# Patient Record
Sex: Female | Born: 1935 | Race: White | Hispanic: No | Smoking: Former smoker
Health system: Southern US, Community
[De-identification: ages and names within clinical notes are randomized; demographics above are authoritative.]

## PROBLEM LIST (undated history)

## (undated) DIAGNOSIS — M7989 Other specified soft tissue disorders: Secondary | ICD-10-CM

## (undated) DIAGNOSIS — E785 Hyperlipidemia, unspecified: Secondary | ICD-10-CM

## (undated) DIAGNOSIS — H269 Unspecified cataract: Secondary | ICD-10-CM

## (undated) DIAGNOSIS — L03116 Cellulitis of left lower limb: Secondary | ICD-10-CM

## (undated) DIAGNOSIS — N189 Chronic kidney disease, unspecified: Secondary | ICD-10-CM

## (undated) DIAGNOSIS — K219 Gastro-esophageal reflux disease without esophagitis: Secondary | ICD-10-CM

## (undated) DIAGNOSIS — Z862 Personal history of diseases of the blood and blood-forming organs and certain disorders involving the immune mechanism: Secondary | ICD-10-CM

## (undated) DIAGNOSIS — J189 Pneumonia, unspecified organism: Secondary | ICD-10-CM

## (undated) DIAGNOSIS — J302 Other seasonal allergic rhinitis: Secondary | ICD-10-CM

## (undated) DIAGNOSIS — Q394 Esophageal web: Secondary | ICD-10-CM

## (undated) DIAGNOSIS — M1990 Unspecified osteoarthritis, unspecified site: Secondary | ICD-10-CM

## (undated) DIAGNOSIS — D71 Functional disorders of polymorphonuclear neutrophils: Secondary | ICD-10-CM

## (undated) DIAGNOSIS — I10 Essential (primary) hypertension: Secondary | ICD-10-CM

## (undated) DIAGNOSIS — J449 Chronic obstructive pulmonary disease, unspecified: Secondary | ICD-10-CM

## (undated) DIAGNOSIS — Z8719 Personal history of other diseases of the digestive system: Secondary | ICD-10-CM

## (undated) DIAGNOSIS — D509 Iron deficiency anemia, unspecified: Secondary | ICD-10-CM

## (undated) DIAGNOSIS — J439 Emphysema, unspecified: Secondary | ICD-10-CM

## (undated) DIAGNOSIS — C4491 Basal cell carcinoma of skin, unspecified: Secondary | ICD-10-CM

## (undated) DIAGNOSIS — I480 Paroxysmal atrial fibrillation: Secondary | ICD-10-CM

## (undated) DIAGNOSIS — M5441 Lumbago with sciatica, right side: Secondary | ICD-10-CM

## (undated) DIAGNOSIS — I499 Cardiac arrhythmia, unspecified: Secondary | ICD-10-CM

## (undated) DIAGNOSIS — T7840XA Allergy, unspecified, initial encounter: Secondary | ICD-10-CM

## (undated) HISTORY — DX: Unspecified cataract: H26.9

## (undated) HISTORY — DX: Functional disorders of polymorphonuclear neutrophils: D71

## (undated) HISTORY — PX: CATARACT EXTRACTION: SUR2

## (undated) HISTORY — DX: Hyperlipidemia, unspecified: E78.5

## (undated) HISTORY — DX: Unspecified osteoarthritis, unspecified site: M19.90

## (undated) HISTORY — DX: Other specified soft tissue disorders: M79.89

## (undated) HISTORY — DX: Cellulitis of left lower limb: L03.116

## (undated) HISTORY — DX: Personal history of diseases of the blood and blood-forming organs and certain disorders involving the immune mechanism: Z86.2

## (undated) HISTORY — DX: Emphysema, unspecified: J43.9

## (undated) HISTORY — DX: Chronic kidney disease, unspecified: N18.9

## (undated) HISTORY — DX: Iron deficiency anemia, unspecified: D50.9

## (undated) HISTORY — DX: Basal cell carcinoma of skin, unspecified: C44.91

## (undated) HISTORY — DX: Allergy, unspecified, initial encounter: T78.40XA

## (undated) HISTORY — DX: Other seasonal allergic rhinitis: J30.2

## (undated) HISTORY — DX: Paroxysmal atrial fibrillation: I48.0

## (undated) HISTORY — DX: Gastro-esophageal reflux disease without esophagitis: K21.9

## (undated) HISTORY — PX: ABDOMINAL HYSTERECTOMY: SHX81

## (undated) HISTORY — DX: Lumbago with sciatica, right side: M54.41

## (undated) HISTORY — PX: OTHER SURGICAL HISTORY: SHX169

## (undated) HISTORY — PX: TOTAL KNEE ARTHROPLASTY: SHX125

## (undated) HISTORY — DX: Chronic obstructive pulmonary disease, unspecified: J44.9

---

## 1976-04-28 HISTORY — PX: APPENDECTOMY: SHX54

## 1994-04-28 HISTORY — PX: ANKLE SURGERY: SHX546

## 1997-09-21 ENCOUNTER — Other Ambulatory Visit: Admission: RE | Admit: 1997-09-21 | Discharge: 1997-09-21 | Payer: Self-pay | Admitting: Obstetrics and Gynecology

## 1998-04-28 HISTORY — PX: ABDOMINAL HYSTERECTOMY: SHX81

## 1998-05-17 ENCOUNTER — Encounter: Payer: Self-pay | Admitting: Obstetrics and Gynecology

## 1998-05-24 ENCOUNTER — Inpatient Hospital Stay (HOSPITAL_COMMUNITY): Admission: RE | Admit: 1998-05-24 | Discharge: 1998-05-28 | Payer: Self-pay | Admitting: Obstetrics and Gynecology

## 1999-02-27 ENCOUNTER — Encounter: Admission: RE | Admit: 1999-02-27 | Discharge: 1999-02-27 | Payer: Self-pay | Admitting: Endocrinology

## 1999-02-27 ENCOUNTER — Encounter: Payer: Self-pay | Admitting: Endocrinology

## 1999-05-09 ENCOUNTER — Ambulatory Visit (HOSPITAL_COMMUNITY): Admission: RE | Admit: 1999-05-09 | Discharge: 1999-05-09 | Payer: Self-pay | Admitting: Gastroenterology

## 1999-05-09 ENCOUNTER — Encounter: Payer: Self-pay | Admitting: Gastroenterology

## 1999-06-14 ENCOUNTER — Encounter (INDEPENDENT_AMBULATORY_CARE_PROVIDER_SITE_OTHER): Payer: Self-pay | Admitting: Specialist

## 1999-06-14 ENCOUNTER — Ambulatory Visit (HOSPITAL_COMMUNITY): Admission: RE | Admit: 1999-06-14 | Discharge: 1999-06-14 | Payer: Self-pay | Admitting: Gastroenterology

## 1999-07-09 ENCOUNTER — Encounter: Payer: Self-pay | Admitting: Obstetrics and Gynecology

## 1999-07-09 ENCOUNTER — Encounter: Admission: RE | Admit: 1999-07-09 | Discharge: 1999-07-09 | Payer: Self-pay | Admitting: Obstetrics and Gynecology

## 2001-02-10 ENCOUNTER — Encounter: Payer: Self-pay | Admitting: Obstetrics and Gynecology

## 2001-02-10 ENCOUNTER — Encounter: Admission: RE | Admit: 2001-02-10 | Discharge: 2001-02-10 | Payer: Self-pay | Admitting: Obstetrics and Gynecology

## 2001-03-12 ENCOUNTER — Ambulatory Visit (HOSPITAL_COMMUNITY): Admission: RE | Admit: 2001-03-12 | Discharge: 2001-03-12 | Payer: Self-pay | Admitting: Oral & Maxillofacial Surgery

## 2001-03-12 ENCOUNTER — Encounter: Payer: Self-pay | Admitting: Oral & Maxillofacial Surgery

## 2001-03-29 ENCOUNTER — Ambulatory Visit (HOSPITAL_COMMUNITY): Admission: RE | Admit: 2001-03-29 | Discharge: 2001-03-29 | Payer: Self-pay | Admitting: Gastroenterology

## 2001-03-29 ENCOUNTER — Encounter: Payer: Self-pay | Admitting: Gastroenterology

## 2001-04-05 ENCOUNTER — Ambulatory Visit (HOSPITAL_COMMUNITY): Admission: RE | Admit: 2001-04-05 | Discharge: 2001-04-05 | Payer: Self-pay | Admitting: Gastroenterology

## 2001-04-05 ENCOUNTER — Encounter: Payer: Self-pay | Admitting: Gastroenterology

## 2002-07-26 ENCOUNTER — Encounter: Payer: Self-pay | Admitting: Family Medicine

## 2002-07-26 ENCOUNTER — Encounter: Admission: RE | Admit: 2002-07-26 | Discharge: 2002-07-26 | Payer: Self-pay | Admitting: Family Medicine

## 2002-10-18 ENCOUNTER — Encounter: Admission: RE | Admit: 2002-10-18 | Discharge: 2002-10-18 | Payer: Self-pay | Admitting: Endocrinology

## 2002-10-18 ENCOUNTER — Encounter: Payer: Self-pay | Admitting: Endocrinology

## 2003-08-28 ENCOUNTER — Encounter: Admission: RE | Admit: 2003-08-28 | Discharge: 2003-08-28 | Payer: Self-pay | Admitting: Endocrinology

## 2005-02-19 ENCOUNTER — Encounter: Admission: RE | Admit: 2005-02-19 | Discharge: 2005-02-19 | Payer: Self-pay | Admitting: Endocrinology

## 2005-02-28 ENCOUNTER — Encounter: Admission: RE | Admit: 2005-02-28 | Discharge: 2005-02-28 | Payer: Self-pay | Admitting: Endocrinology

## 2006-03-05 ENCOUNTER — Encounter: Admission: RE | Admit: 2006-03-05 | Discharge: 2006-03-05 | Payer: Self-pay | Admitting: Endocrinology

## 2006-04-28 HISTORY — PX: TOTAL KNEE ARTHROPLASTY: SHX125

## 2006-10-21 ENCOUNTER — Inpatient Hospital Stay (HOSPITAL_COMMUNITY): Admission: RE | Admit: 2006-10-21 | Discharge: 2006-10-24 | Payer: Self-pay | Admitting: Orthopedic Surgery

## 2008-10-24 ENCOUNTER — Encounter: Admission: RE | Admit: 2008-10-24 | Discharge: 2008-10-24 | Payer: Self-pay | Admitting: Orthopedic Surgery

## 2008-11-10 ENCOUNTER — Encounter: Admission: RE | Admit: 2008-11-10 | Discharge: 2008-11-10 | Payer: Self-pay | Admitting: Orthopedic Surgery

## 2010-02-12 LAB — HM DEXA SCAN: HM Dexa Scan: NORMAL

## 2010-02-21 ENCOUNTER — Encounter: Admission: RE | Admit: 2010-02-21 | Discharge: 2010-02-21 | Payer: Self-pay | Admitting: Endocrinology

## 2010-03-26 ENCOUNTER — Ambulatory Visit: Payer: Self-pay | Admitting: Vascular Surgery

## 2010-04-28 HISTORY — PX: TOTAL KNEE ARTHROPLASTY: SHX125

## 2010-07-02 ENCOUNTER — Ambulatory Visit (INDEPENDENT_AMBULATORY_CARE_PROVIDER_SITE_OTHER): Payer: Medicare Other | Admitting: Internal Medicine

## 2010-07-02 ENCOUNTER — Encounter: Payer: Self-pay | Admitting: Internal Medicine

## 2010-07-02 DIAGNOSIS — Z79899 Other long term (current) drug therapy: Secondary | ICD-10-CM

## 2010-07-02 DIAGNOSIS — M1710 Unilateral primary osteoarthritis, unspecified knee: Secondary | ICD-10-CM

## 2010-07-02 DIAGNOSIS — I10 Essential (primary) hypertension: Secondary | ICD-10-CM

## 2010-07-02 DIAGNOSIS — M1990 Unspecified osteoarthritis, unspecified site: Secondary | ICD-10-CM

## 2010-07-02 DIAGNOSIS — I739 Peripheral vascular disease, unspecified: Secondary | ICD-10-CM | POA: Insufficient documentation

## 2010-07-02 DIAGNOSIS — E7800 Pure hypercholesterolemia, unspecified: Secondary | ICD-10-CM | POA: Insufficient documentation

## 2010-07-02 DIAGNOSIS — Q394 Esophageal web: Secondary | ICD-10-CM | POA: Insufficient documentation

## 2010-07-02 DIAGNOSIS — E785 Hyperlipidemia, unspecified: Secondary | ICD-10-CM

## 2010-07-02 DIAGNOSIS — K219 Gastro-esophageal reflux disease without esophagitis: Secondary | ICD-10-CM

## 2010-07-02 LAB — CBC WITH DIFFERENTIAL/PLATELET
Basophils Relative: 0.5 % (ref 0.0–3.0)
Eosinophils Absolute: 0 10*3/uL (ref 0.0–0.7)
Eosinophils Relative: 0 % (ref 0.0–5.0)
HCT: 37.4 % (ref 36.0–46.0)
Hemoglobin: 12.6 g/dL (ref 12.0–15.0)
Lymphocytes Relative: 21.8 % (ref 12.0–46.0)
Lymphs Abs: 1.8 10*3/uL (ref 0.7–4.0)
MCHC: 33.7 g/dL (ref 30.0–36.0)
MCV: 98.8 fl (ref 78.0–100.0)
Monocytes Absolute: 0.8 10*3/uL (ref 0.1–1.0)
Monocytes Relative: 9.6 % (ref 3.0–12.0)
Neutro Abs: 5.6 10*3/uL (ref 1.4–7.7)
Platelets: 165 10*3/uL (ref 150.0–400.0)
RBC: 3.79 Mil/uL — ABNORMAL LOW (ref 3.87–5.11)
RDW: 15 % — ABNORMAL HIGH (ref 11.5–14.6)
WBC: 8.2 10*3/uL (ref 4.5–10.5)

## 2010-07-02 LAB — BASIC METABOLIC PANEL
BUN: 30 mg/dL — ABNORMAL HIGH (ref 6–23)
CO2: 26 mEq/L (ref 19–32)
Calcium: 10.3 mg/dL (ref 8.4–10.5)
Chloride: 108 mEq/L (ref 96–112)
Creatinine, Ser: 1.3 mg/dL — ABNORMAL HIGH (ref 0.4–1.2)
Glucose, Bld: 91 mg/dL (ref 70–99)
Potassium: 5.2 mEq/L — ABNORMAL HIGH (ref 3.5–5.1)
Sodium: 142 mEq/L (ref 135–145)

## 2010-07-02 LAB — HEPATIC FUNCTION PANEL
ALT: 16 U/L (ref 0–35)
AST: 29 U/L (ref 0–37)
Albumin: 4.3 g/dL (ref 3.5–5.2)
Alkaline Phosphatase: 77 U/L (ref 39–117)
Bilirubin, Direct: 0.1 mg/dL (ref 0.0–0.3)
Total Bilirubin: 0.5 mg/dL (ref 0.3–1.2)
Total Protein: 6.8 g/dL (ref 6.0–8.3)

## 2010-07-02 MED ORDER — CELECOXIB 100 MG PO CAPS: 100.0000 mg | ORAL_CAPSULE | Freq: Two times a day (BID) | ORAL | Status: DC | PRN

## 2010-07-02 NOTE — Progress Notes (Signed)
  Subjective:    Patient ID: Judith Boone, female    DOB: [birth year], 77 y.o.   MRN: [id number]  HPI  Pt presents to clinic to establish primary care and for evaluation of arthritis pain. Has longstanding h/o arthritis pain involving knees and back. Now s/p unilateral TKR followed by orthopedics. Previously took celebrex with good response and without adverse effect. Did have previous difficulty with insurance coverage for the medication but believes that has changed. Has past h/o esophageal web s/p dilatation and denies recent dysphagia. Does have PAD with moderately decreased left ABI 10/11. Recalls seeing vascular surgery with no current recommendations for surgical intervention. Has muscle cramps intermittently but no recent sx's c/w claudication.  Defers mammography. Tolerates statin tx 2x/wk with January 2012 lipid panel showing good control.  Reviewed PMH, PSH, medications, allergies, social hx and family hx.    Review of Systems  Constitutional: Negative for fever, chills and fatigue.  HENT: Negative for hearing loss, congestion and facial swelling.   Eyes: Negative for discharge and redness.  Respiratory: Negative for cough and shortness of breath.   Cardiovascular: Negative for chest pain and palpitations.  Gastrointestinal: Negative for abdominal pain and blood in stool.  Genitourinary: Negative for hematuria, decreased urine volume and difficulty urinating.  Musculoskeletal: Positive for back pain and arthralgias. Negative for myalgias and joint swelling.  Skin: Negative for color change and rash.  Neurological: Negative for weakness.  Hematological: Negative.   Psychiatric/Behavioral: Negative.        Objective:   Physical Exam  [nursing notereviewed. Constitutional: She appears well-developed and well-nourished. No distress.  HENT:  Head: Normocephalic and atraumatic.  Right Ear: External ear normal.  Left Ear: External ear normal.  Nose: Nose normal.  Mouth/Throat:  Oropharynx is clear and moist. No oropharyngeal exudate.  Eyes: Conjunctivae are normal. Right eye exhibits no discharge. Left eye exhibits no discharge. No scleral icterus.  Neck: Neck supple.  Cardiovascular: Normal rate, regular rhythm and normal heart sounds.  Exam reveals no gallop and no friction rub.   No murmur heard. Pulmonary/Chest: Effort normal and breath sounds normal. No respiratory distress. She has no wheezes. She has no rales.  Abdominal: Soft. She exhibits no distension. There is no tenderness. There is no rebound.  Lymphadenopathy:    She has no cervical adenopathy.  Neurological: She is alert. Gait normal.  Skin: Skin is warm and dry. No rash noted. She is not diaphoretic. No erythema.  Psychiatric: She has a normal mood and affect.          Assessment & Plan:

## 2010-07-02 NOTE — Assessment & Plan Note (Signed)
Reattempt celebrex prn. Discussed potential renal and gi adverse effects.

## 2010-07-02 NOTE — Assessment & Plan Note (Signed)
Good control. Continue statin tx. Obtain lft today.

## 2010-07-02 NOTE — Assessment & Plan Note (Signed)
Mild elevation. Obtain cbc, chem7. Recommend outpt bp log to be submitted for review.

## 2010-07-03 ENCOUNTER — Telehealth: Payer: Self-pay

## 2010-07-03 NOTE — Telephone Encounter (Signed)
Message copied by Kathlene November on Wed Jul 03, 2010 10:46 AM ------      Message from: Jeb Levering, Marcello Moores      Created: Tue Jul 02, 2010  9:51 PM       Potassium slightly high. Can stop potassium supplements. Creatinine only slightly above nl. Other labs nl

## 2010-07-04 ENCOUNTER — Telehealth: Payer: Self-pay

## 2010-07-04 ENCOUNTER — Other Ambulatory Visit: Payer: Self-pay

## 2010-07-04 MED ORDER — ESTROGENS CONJUGATED 0.3 MG PO TABS: 0.3000 mg | ORAL_TABLET | ORAL | Status: DC

## 2010-07-04 NOTE — Telephone Encounter (Signed)
Message copied by Kathlene November on Thu Jul 04, 2010  9:27 AM ------      Message from: Jeb Levering, Marcello Moores      Created: Tue Jul 02, 2010  9:51 PM       Potassium slightly high. Can stop potassium supplements. Creatinine only slightly above nl. Other labs nl

## 2010-07-04 NOTE — Telephone Encounter (Signed)
Pt aware and verbalized understanding.  

## 2010-08-07 ENCOUNTER — Telehealth: Payer: Self-pay

## 2010-08-07 NOTE — Telephone Encounter (Signed)
Called to notify pt that prior auth for celebrex was denied twice. Pt was aware of the possibility of it being denied and verbalized her understanding. Pt wants to know if there is anything else she can try for her arthritis. She states that she has had mobic before and it gave her some relief, so she would be willing to try that again if there isn't anything else. She also notes that she had voltaren before but experienced several side effects, so she does not want that again

## 2010-08-08 MED ORDER — MELOXICAM 7.5 MG PO TABS: 7.5000 mg | ORAL_TABLET | Freq: Every day | ORAL | Status: DC

## 2010-08-08 NOTE — Telephone Encounter (Signed)
Rx for mobic escribed to pharm. Pt aware

## 2010-08-08 NOTE — Telephone Encounter (Signed)
mobic 7.5mg  po qd prn #30 rf4

## 2010-09-10 NOTE — Op Note (Signed)
NAME:  Judith Boone, Judith Boone                  ACCOUNT NO.:  [id number]   MEDICAL RECORD NO.:  [id number]          PATIENT TYPE:  INP   LOCATION:  2550                         FACILITY:  [facility]   PHYSICIAN:  Ninetta Lights, M.D. DATE OF BIRTH:  09-20-1931   DATE OF PROCEDURE:  10/21/2006  DATE OF DISCHARGE:                               OPERATIVE REPORT   PREOPERATIVE DIAGNOSIS:  Right knee end stage degenerative arthritis,  valgus alignment.   POSTOPERATIVE DIAGNOSIS:  Right knee end stage degenerative arthritis,  valgus alignment.   OPERATIVE PROCEDURE:  Right total knee replacement, Stryker triathlon  prosthesis.  Minimally invasive system.  Cemented posterior stabilized  #4 femoral component.  Cemented #4 tibial component with 9 mm posterior  stabilized polyethylene insert.  Resurfacing 32 mm medial offset peg  cemented patellar component.   SURGEON:  Ninetta Lights, M.D.   ASSISTANT:  Alyson Locket. Ricard Dillon, P.A.-C., present throughout the entire case.   ANESTHESIA:  Spinal.   SPECIMENS:  None.   COMPLICATIONS:  None.   DRESSING:  Soft compressive with knee immobilizer.   TOURNIQUET TIME:  1 hour 20 minutes.   DRAIN:  Hemovac x1.   PROCEDURE:  The patient was brought to the operating room and placed on  operating table in a supine position.  After adequate anesthesia had  been obtained, right knee examined.  Increased valgus reasonably  correctable but still a little increased from mechanical axis.  Slight  flexion contracture further flexion 130 degrees.  Stable ligaments.  Tourniquet applied.  Prepped and draped in the usual sterile fashion.  Exsanguinated with elevation of Esmarch and tourniquet inflated to 350  mmHg.  Straight incision above the patella down to the tibial tubercle.  Medial arthrotomy, vastus splitting, preserving the quad tendon.  Knee  exposed.  Remnants of menisci, periarticular spurs, cruciate ligaments,  loose bodies all debrided.  Distal femur exposed.   Intramedullary guide  placed.  Distal cut 6 degrees of valgus to match mechanical axis  resecting 10 mm.  Epicondylar axis marked.  Sized, cut and fitted for a  posterior stabilized #4 femoral component which fit well.  Proximal  tibial resection extramedullary guide 3 degrees posterior slope cut.  Sufficient resection for a 9 mm implant.  Sized for  #4 component.  Patella was measured, the posterior 10 mm removed.  Size drilled and  fitted for a 32 mm component.  Trials put in place throughout.  #4 on  the femur, #4 on the tibia, 9 mm insert on the tibia, and a 32 on the  patella.  With this and with some capsular releasing laterally, I had  full extension, full flexion, excellent stability.  No lift off on  flexion and good patellofemoral tracking.  Tibia was marked for  appropriate rotation and then hand reamed.  All trials removed.  Copious  irrigation with a pulse irrigating device.  Cement prepared and placed  on all components.  All components firmly seated.  Polyethylene attached  to tibia.  Once the cement hardened, the knee was re-examined.  Full  extension, full flexion,  good patellofemoral tracking and stability.  Wound irrigated.  Hemovac placed through a separate stab wound.  Arthrotomy closed with #1 Vicryl.  Skin and subcutaneous tissue with  Vicryl and staples.  Margins of the wound injected with Marcaine.  Sterile compressive dressing applied.  Tourniquet was removed.  Knee  immobilizer applied.  Anesthesia reversed.  Brought to the recovery  room.  Tolerated surgery well with no complications.      Ninetta Lights, M.D.  Electronically Signed     DFM/MEDQ  D:  10/21/2006  T:  10/21/2006  Job:  [id number]

## 2010-09-10 NOTE — Consult Note (Signed)
NEW PATIENT CONSULTATION   Boone, Judith S  DOB:  Jul 24, 1931                                       03/26/2010  [id number]   Judith Boone presents today for evaluation of left calf claudication.  She  is a very pleasant 77 year old female with multiple lower extremity  complaints.  She reports bilateral night cramping.  This is in her  calves and mainly and sometimes into her feet.  She also reports that  she has severe degenerative disease in both knees.  She has had a right  knee replacement and may need a left knee replacement.  She also has  lower back and arthritis and scoliosis and does have very clear cut left  calf claudication symptoms.  She reports that when she is walking that  she feels that it may be her back in his most limiting to her.  She is  unable to walk for great distances due to this but she is able to walk  on a treadmill..  She has no history of lower extremity tissue loss.   Her past history is negative cardiac disease.  She does have  hypertension, elevated cholesterol and severe arthritis.   SOCIAL HISTORY:  She is married with 2 children.  She is a housewife.  She quit smoking in 1981 and does have 1-2 glasses of wine per day.   FAMILY HISTORY:  Negative for premature atherosclerotic disease.   REVIEW OF SYSTEMS:  No weight loss or gain.  She weighs 145 pounds.  She  is 5 feet 2 inches tall.  VASCULAR:  Positive for left calf claudication.  CARDIAC:  Negative.  GI:  Reflux.  Neurologic, pulmonary, hematologic and GU is negative.  ENT, psychiatric and skin is negative.  MUSCULOSKELETAL:  Positive for arthritis, joint pain and muscle pain.   PHYSICAL EXAMINATION:  Well-developed, well-nourished white female  appearing stated age in no acute distress.  Blood pressure 160/83, pulse 96, respirations 18.  HEENT:  Normal.  CHEST:  Clear bilaterally without rales, rhonchi or wheezes.  HEART:  Regular rate and rhythm without murmur.   Her carotid arteries  are without bruits bilaterally.  She has 2+ radial and 2+ femoral pulses  bilaterally.  I do not palpate popliteal pulses bilaterally.  She does  have a 2+ right posterior tibial pulse and absent pedal pulses on the  left.  ABDOMEN:  Soft, nontender.  No masses noted.  MUSCULOSKELETAL:  Shows no major deformity or cyanosis.  NEUROLOGIC:  No focal weakness or paresthesias.  SKIN:  Without ulcers or rashes.   She does have noninvasive lower extremity studies at [facility]  on the February 21, 2010.  This shows ankle arm index of 0.76 on the left  and 1.0 on the right.   I discussed this with Judith Boone.  I explained that she does clearly have  left calf claudication related to arterial insufficiency most likely  related to superficial artery occlusive disease.  I explained treatment  options for this.  I explained that she does not have any level of  arterial insufficiency that would cause limb threatening issues and  therefore would recommend conservative treatment.  I explained that of a  multitude of difficulties she has with walking that only calf  claudication would be corrected with intervention related to arterial  insufficiency.  Since  this is not her most limiting and certainly not  the only limiting problem, I would recommend no other evaluation or  treatment this time unless she has progressive pain or tissue loss.  She  will notify us should this occur.  Otherwise she will see [place] again on an  as-needed basis.     Rosetta Posner, M.D.  Electronically Signed   TFE/MEDQ  D:  03/26/2010  T:  03/27/2010  Job:  [id number]   cc:   Ninetta Lights, M.D.

## 2010-09-13 NOTE — Discharge Summary (Signed)
NAME:  Judith Boone, Judith Boone                  ACCOUNT NO.:  [id number]   MEDICAL RECORD NO.:  [id number]          PATIENT TYPE:  INP   LOCATION:  5010                         FACILITY:  [facility]   PHYSICIAN:  Ninetta Lights, M.D. DATE OF BIRTH:  Mar 24, 1932   DATE OF ADMISSION:  10/21/2006  DATE OF DISCHARGE:  10/24/2006                               DISCHARGE SUMMARY   FINAL DIAGNOSES:  1. Status post right total knee replacement for end-stage degenerative      joint disease.  2. Hypertension.  3. Hyperlipidemia.  4. Gastroesophageal reflux disease.   HISTORY OF PRESENT ILLNESS:  A 77 year old white female with a history  of end-stage DJD, right knee, and chronic pain, presented to our office  for a preoperative evaluation for total knee replacement.  She had  progressively worsening pain with failed response to conservative  treatment.  Significant decrease in her daily activities due to the  ongoing complaint.   HOSPITAL COURSE:  On October 21, 2006, patient was taken to the [facility]  operating room, and a right total knee replacement procedure performed.  Surgeon Kathryne Hitch, M.D. and assistant Benjiman Core, PA-C.  Anesthesia  epidural.  No specimens.  EBL minimal.  Tourniquet time one hour and 22  minutes.  One Autovac drain placed.  There were no surgical or  anesthesia complications.  Patient was transferred to recovery room in  stable condition.   On October 22, 2006, patient doing well.  A complaint of some right knee  pain.  Temp 99, pulse 76, respirations 16, blood pressure 120/53.  Hemoglobin 10.7, hematocrit 31.4, INR 1.1.  Glucose 142.  Dressing  clean, dry, and intact.  Calf nontender.  Neurovascularly intact.  Pharmacy-protocol Coumadin started.   On October 23, 2006, patient doing well with good pain control.  Vital  signs stable.  Afebrile.  Hemoglobin 10.9, hematocrit 32.2, INR 1.3,  glucose 136.  Sodium 132.  The wound looked good, staples intact.  No  drainage or signs of  infection.  Calf nontender, neurovascularly intact.  Hemovac drain discontinued.  DC PCA and Foley.  Saline-locked IV.   On October 24, 2006, patient doing well.  Temp 99, pulse 100, respirations  16, blood pressure 128/56.  States that she is ready to go home.  WBC  9.8, hemoglobin 10, hematocrit 29.7, platelets 142.  Sodium 136,  potassium 4.1, chloride 102, CO2 28, BUN 13, creatinine 0.9, glucose  112.  Wound looked good.  Staples intact.  No drainage or signs of  infection.  Calf nontender, neurovascularly intact.  She has progressed  well with therapy.   CONDITION:  Good and stable.   DISPOSITION:  Discharged home.   MEDICATIONS:  1. Percocet 7.5/325 1-2 tabs p.o. q.4-6h. p.r.n. pain.  2. Robaxin 500 mg 1 tab p.o. q.6h. p.r.n. spasms.  3. Coumadin, pharmacy protocol.  4. Resume previous home meds.   INSTRUCTIONS:  Patient will work with home health PT/OT to improve knee  range of motion and strengthening, ambulation.  Daily dressing changes  with 4x4 gauze and tape.  Coumadin x4 weeks  postop for DVT prophylaxis.  Follow up two weeks postop for recheck and possible staple removal.  Return sooner if needed.      Alyson Locket. Velora Heckler.      Ninetta Lights, M.D.  Electronically Signed    JMO/MEDQ  D:  12/18/2006  T:  12/18/2006  Job:  [id number]

## 2010-12-02 ENCOUNTER — Ambulatory Visit: Payer: BC Managed Care – PPO | Admitting: Internal Medicine

## 2010-12-12 ENCOUNTER — Ambulatory Visit (INDEPENDENT_AMBULATORY_CARE_PROVIDER_SITE_OTHER): Payer: Medicare Other | Admitting: Internal Medicine

## 2010-12-12 ENCOUNTER — Encounter: Payer: Self-pay | Admitting: Internal Medicine

## 2010-12-12 DIAGNOSIS — Z79899 Other long term (current) drug therapy: Secondary | ICD-10-CM

## 2010-12-12 DIAGNOSIS — H9190 Unspecified hearing loss, unspecified ear: Secondary | ICD-10-CM | POA: Insufficient documentation

## 2010-12-12 DIAGNOSIS — E785 Hyperlipidemia, unspecified: Secondary | ICD-10-CM

## 2010-12-12 DIAGNOSIS — K219 Gastro-esophageal reflux disease without esophagitis: Secondary | ICD-10-CM

## 2010-12-12 DIAGNOSIS — R5383 Other fatigue: Secondary | ICD-10-CM | POA: Insufficient documentation

## 2010-12-12 DIAGNOSIS — R5381 Other malaise: Secondary | ICD-10-CM

## 2010-12-12 LAB — HM PAP SMEAR

## 2010-12-12 MED ORDER — ESTROGENS CONJUGATED 0.3 MG PO TABS: 0.3000 mg | ORAL_TABLET | ORAL | Status: DC

## 2010-12-12 MED ORDER — QUINAPRIL HCL 20 MG PO TABS: 20.0000 mg | ORAL_TABLET | Freq: Every day | ORAL | Status: DC

## 2010-12-12 MED ORDER — TRIAMTERENE-HCTZ 37.5-25 MG PO TABS: 1.0000 | ORAL_TABLET | Freq: Every day | ORAL | Status: DC

## 2010-12-12 MED ORDER — MELOXICAM 7.5 MG PO TABS: 7.5000 mg | ORAL_TABLET | Freq: Every day | ORAL | Status: DC

## 2010-12-12 MED ORDER — OMEPRAZOLE 40 MG PO CPDR: 40.0000 mg | DELAYED_RELEASE_CAPSULE | Freq: Every day | ORAL | Status: DC

## 2010-12-12 MED ORDER — ROSUVASTATIN CALCIUM 5 MG PO TABS: 5.0000 mg | ORAL_TABLET | ORAL | Status: DC

## 2010-12-12 NOTE — Progress Notes (Signed)
  Subjective:    Patient ID: [name], female    DOB: 1932/04/20, 77 y.o.   MRN: [id number]  HPI Pt presents to clinic for followup of multiple medical problems. Notes daily GERD sx's without dysphagia, abd pain or blood in stool. Takes pepcid prn with improvement. Notes recent increase in fatigue. +stressors as well. Tolerating mobic for OA without GI adverse effect. Has chronic hearing loss that seems to be worsening. Thinking about possible audiology evaluation. No other complaints.  Reviewed pmh, medications and allergies    Review of Systems see hpi     Objective:   Physical Exam  Nursing note and vitals reviewed. Constitutional: She appears well-developed and well-nourished. No distress.  HENT:  Head: Normocephalic and atraumatic.  Right Ear: Tympanic membrane, external ear and ear canal normal.  Left Ear: Tympanic membrane, external ear and ear canal normal.  Eyes: Conjunctivae are normal. No scleral icterus.  Neck: Neck supple. Carotid bruit is not present. No thyromegaly present.  Cardiovascular: Normal rate, regular rhythm and normal heart sounds.  Exam reveals no gallop and no friction rub.   No murmur heard. Pulmonary/Chest: Effort normal and breath sounds normal. No respiratory distress. She has no wheezes. She has no rales.  Neurological: She is alert.  Skin: Skin is warm and dry. She is not diaphoretic.  Psychiatric: She has a normal mood and affect.          Assessment & Plan:

## 2010-12-12 NOTE — Patient Instructions (Signed)
Please have your fasting labs done tomorrow morning at the [facility] lab. Please try magnesium oxide one a day for leg cramps.

## 2010-12-12 NOTE — Assessment & Plan Note (Signed)
Begin omeprazole 40mg  po qd. followup if no resolution within 3-4 wks

## 2010-12-12 NOTE — Assessment & Plan Note (Signed)
Discussed audiology referral. Currently not convinced about possible hearing aids. Will call if wishes to proceed with referral.

## 2010-12-12 NOTE — Assessment & Plan Note (Signed)
Obtain lipid/lft. 

## 2010-12-12 NOTE — Assessment & Plan Note (Signed)
Obtain tsh and chem7

## 2010-12-13 ENCOUNTER — Ambulatory Visit: Payer: Medicare Other

## 2010-12-13 DIAGNOSIS — E785 Hyperlipidemia, unspecified: Secondary | ICD-10-CM

## 2010-12-13 DIAGNOSIS — R5383 Other fatigue: Secondary | ICD-10-CM

## 2010-12-13 DIAGNOSIS — Z79899 Other long term (current) drug therapy: Secondary | ICD-10-CM

## 2010-12-13 LAB — HEPATIC FUNCTION PANEL
ALT: 17 U/L (ref 0–35)
AST: 30 U/L (ref 0–37)
Bilirubin, Direct: 0.1 mg/dL (ref 0.0–0.3)
Total Protein: 6.8 g/dL (ref 6.0–8.3)

## 2010-12-13 LAB — LIPID PANEL
Cholesterol: 187 mg/dL (ref 0–200)
LDL Cholesterol: 84 mg/dL (ref 0–99)
Triglycerides: 38 mg/dL (ref 0.0–149.0)
VLDL: 7.6 mg/dL (ref 0.0–40.0)

## 2010-12-13 LAB — BASIC METABOLIC PANEL
CO2: 23 mEq/L (ref 19–32)
Chloride: 107 mEq/L (ref 96–112)
Sodium: 142 mEq/L (ref 135–145)

## 2011-01-02 ENCOUNTER — Telehealth: Payer: Self-pay | Admitting: *Deleted

## 2011-01-02 NOTE — Telephone Encounter (Signed)
Last office note from 12/12/2010, phone 01/02/2011 and labs from 12/12/2010 faxed to Dr. Jamison Oka office providing surgical clearance for patient.

## 2011-01-02 NOTE — Telephone Encounter (Signed)
Juliann Pulse from Dr. Vilma Prader office with Ortho called and stated patient is scheduled for Total Knee Replacement on 01/08/2011 of left knee. They are needing a letter stating patient is okay to have surgery done.  She is requesting the  medical records last office visit, and labs faxed to [phone]. Juliann Pulse was informed patient was seen on 12/12/2010, and did not mention that she was having surgery done, therefore the provides last office note does reflect the request. She was informed that patient may need to return for evaluation. Juliann Pulse stated that patient will have  CXR and EKG done pre-op and a letter stating her medical status was all that was needed. She was informed I would sent information to provider and if there is a problem. I would call back, otherwise the records would be faxed.

## 2011-01-02 NOTE — Telephone Encounter (Signed)
No known medical contraindication for proceeding with surgery.

## 2011-01-03 ENCOUNTER — Other Ambulatory Visit (HOSPITAL_COMMUNITY): Payer: Self-pay | Admitting: Orthopedic Surgery

## 2011-01-03 ENCOUNTER — Encounter (HOSPITAL_COMMUNITY)
Admission: RE | Admit: 2011-01-03 | Discharge: 2011-01-03 | Disposition: A | Discharge status: Home / Self Care | Payer: Medicare Other | Source: Ambulatory Visit | Attending: Orthopedic Surgery | Admitting: Orthopedic Surgery

## 2011-01-03 ENCOUNTER — Ambulatory Visit (HOSPITAL_COMMUNITY)
Admission: RE | Admit: 2011-01-03 | Discharge: 2011-01-03 | Disposition: A | Discharge status: Home / Self Care | Payer: Medicare Other | Source: Ambulatory Visit | Attending: Orthopedic Surgery | Admitting: Orthopedic Surgery

## 2011-01-03 DIAGNOSIS — IMO0002 Reserved for concepts with insufficient information to code with codable children: Secondary | ICD-10-CM | POA: Insufficient documentation

## 2011-01-03 DIAGNOSIS — M1710 Unilateral primary osteoarthritis, unspecified knee: Secondary | ICD-10-CM | POA: Insufficient documentation

## 2011-01-03 DIAGNOSIS — Z01818 Encounter for other preprocedural examination: Secondary | ICD-10-CM | POA: Insufficient documentation

## 2011-01-03 DIAGNOSIS — Z01810 Encounter for preprocedural cardiovascular examination: Secondary | ICD-10-CM | POA: Insufficient documentation

## 2011-01-03 DIAGNOSIS — Z01812 Encounter for preprocedural laboratory examination: Secondary | ICD-10-CM | POA: Insufficient documentation

## 2011-01-03 DIAGNOSIS — M1712 Unilateral primary osteoarthritis, left knee: Secondary | ICD-10-CM

## 2011-01-03 LAB — COMPREHENSIVE METABOLIC PANEL
ALT: 17 U/L (ref 0–35)
AST: 26 U/L (ref 0–37)
CO2: 24 mEq/L (ref 19–32)
Calcium: 9.6 mg/dL (ref 8.4–10.5)
Chloride: 107 mEq/L (ref 96–112)
GFR calc non Af Amer: 44 mL/min — ABNORMAL LOW (ref >60)
Sodium: 143 mEq/L (ref 135–145)

## 2011-01-03 LAB — TYPE AND SCREEN

## 2011-01-03 LAB — SURGICAL PCR SCREEN: Staphylococcus aureus: NEGATIVE

## 2011-01-03 LAB — CBC
HCT: 36.7 % (ref 36.0–46.0)
Hemoglobin: 12.4 g/dL (ref 12.0–15.0)
MCH: 31.9 pg (ref 26.0–34.0)
MCHC: 33.8 g/dL (ref 30.0–36.0)
MCV: 94.3 fL (ref 78.0–100.0)

## 2011-01-03 LAB — PROTIME-INR: INR: 0.95 (ref 0.00–1.49)

## 2011-01-08 ENCOUNTER — Inpatient Hospital Stay (HOSPITAL_COMMUNITY): Payer: Medicare Other

## 2011-01-08 ENCOUNTER — Inpatient Hospital Stay (HOSPITAL_COMMUNITY)
Admission: RE | Admit: 2011-01-08 | Discharge: 2011-01-10 | DRG: 470 | Disposition: A | Discharge status: Home Health | Payer: Medicare Other | Source: Ambulatory Visit | Attending: Orthopedic Surgery | Admitting: Orthopedic Surgery

## 2011-01-08 DIAGNOSIS — Z01810 Encounter for preprocedural cardiovascular examination: Secondary | ICD-10-CM

## 2011-01-08 DIAGNOSIS — K219 Gastro-esophageal reflux disease without esophagitis: Secondary | ICD-10-CM | POA: Diagnosis present

## 2011-01-08 DIAGNOSIS — M1710 Unilateral primary osteoarthritis, unspecified knee: Principal | ICD-10-CM | POA: Diagnosis present

## 2011-01-08 DIAGNOSIS — Z96659 Presence of unspecified artificial knee joint: Secondary | ICD-10-CM

## 2011-01-08 DIAGNOSIS — I10 Essential (primary) hypertension: Secondary | ICD-10-CM | POA: Diagnosis present

## 2011-01-08 DIAGNOSIS — Z87891 Personal history of nicotine dependence: Secondary | ICD-10-CM

## 2011-01-08 DIAGNOSIS — E7800 Pure hypercholesterolemia, unspecified: Secondary | ICD-10-CM | POA: Diagnosis present

## 2011-01-08 DIAGNOSIS — Z01812 Encounter for preprocedural laboratory examination: Secondary | ICD-10-CM

## 2011-01-08 LAB — URINALYSIS, ROUTINE W REFLEX MICROSCOPIC
Glucose, UA: NEGATIVE mg/dL
Leukocytes, UA: NEGATIVE
Nitrite: NEGATIVE
Protein, ur: NEGATIVE mg/dL
Urobilinogen, UA: 0.2 mg/dL (ref 0.0–1.0)

## 2011-01-09 LAB — BASIC METABOLIC PANEL
Calcium: 8.6 mg/dL (ref 8.4–10.5)
Creatinine, Ser: 0.97 mg/dL (ref 0.50–1.10)
GFR calc non Af Amer: 56 mL/min — ABNORMAL LOW (ref >60)
Sodium: 140 mEq/L (ref 135–145)

## 2011-01-09 LAB — PROTIME-INR
INR: 1.11 (ref 0.00–1.49)
Prothrombin Time: 14.5 seconds (ref 11.6–15.2)

## 2011-01-09 LAB — CBC
Platelets: 135 10*3/uL — ABNORMAL LOW (ref 150–400)
RDW: 14.3 % (ref 11.5–15.5)
WBC: 7.7 10*3/uL (ref 4.0–10.5)

## 2011-01-10 LAB — BASIC METABOLIC PANEL
BUN: 19 mg/dL (ref 6–23)
Calcium: 8.2 mg/dL — ABNORMAL LOW (ref 8.4–10.5)
GFR calc Af Amer: 53 mL/min — ABNORMAL LOW (ref >60)
GFR calc non Af Amer: 44 mL/min — ABNORMAL LOW (ref >60)
Potassium: 4.3 mEq/L (ref 3.5–5.1)
Sodium: 139 mEq/L (ref 135–145)

## 2011-01-10 LAB — PROTIME-INR: Prothrombin Time: 19.8 seconds — ABNORMAL HIGH (ref 11.6–15.2)

## 2011-01-10 LAB — CBC
Platelets: 116 10*3/uL — ABNORMAL LOW (ref 150–400)
RBC: 2.86 MIL/uL — ABNORMAL LOW (ref 3.87–5.11)
RDW: 14.1 % (ref 11.5–15.5)
WBC: 9.6 10*3/uL (ref 4.0–10.5)

## 2011-01-29 NOTE — Op Note (Signed)
  NAMESUZUKO, [name] NO.:  [id number]  MEDICAL RECORD NO.:  [id number]  LOCATION:  5034                         FACILITY:  [facility]  PHYSICIAN:  Ninetta Lights, M.D. DATE OF BIRTH:  1931-05-31  DATE OF PROCEDURE:  01/08/2011 DATE OF DISCHARGE:                              OPERATIVE REPORT   PREOPERATIVE DIAGNOSIS:  Left knee end-stage degenerative arthritis, valgus alignment.  POSTOPERATIVE DIAGNOSIS:  Left knee end-stage degenerative arthritis, valgus alignment.  PROCEDURE:  Modified minimally invasive left total knee replacement Stryker triathlon prosthesis.  Soft tissue balancing.  Cemented pegged posterior stabilized #4 femoral component.  Cemented #4 tibial component, 9-mm polyethylene insert.  Cemented 32-mm patellar component.  SURGEON:  Ninetta Lights, MD  ASSISTANT:  Benjiman Core, PA present throughout the entire case and necessary for timely completion of procedure.  ANESTHESIA:  General.  BLOOD LOSS:  Minimal.  SPECIMENS:  None.  CULTURES:  None.  COMPLICATIONS:  None.  DRESSING:  Soft compressive with knee immobilizer.  DRAIN:  Hemovac x1.  TOURNIQUET TIME:  One hour.  PROCEDURE:  The patient was brought to the operating room, placed on the operating table in supine position.  After adequate anesthesia had been obtained, knee examined.  Increased valgus correctable.  Reasonable flexion extension.  Tourniquet applied, prepped and draped in usual sterile fashion.  External elevation Esmarch tourniquet inflated to 300 mmHg.  Straight incision above the patella down to tibial tubercle. Medial arthrotomy vastus splitting preserving quad tendon.  Knee exposed.  Grade 4 changes most marked laterally.  Medial capsule release.  Distal femur exposed.  Intramedullary guide placed.  A 10-mm resection set at 5 degrees of valgus.  Using epicondylar axis size cut and fitted for posterior stabilized peg #4 component.  Proximal tibial resection  3 degree posterior slope cut.  Size #4 component.  Recess examined to be sure remnants of menisci, loose bodies, all debris removed.  Patella exposed, posterior 10 mm removed.  Drilled and sized 54 and 32-mm component.  Trials put in place throughout.  With a #4 above below 9-mm insert, a 32 patella, I had excellent mechanical axis, good balancing of flexion/extension and good patellofemoral tracking. Tibia was marked for appropriate rotation and hand reamed.  All trials removed.  Copious irrigation with a pulse irrigating device.  Cement prepared and placed on all components firmly seated.  Polyethylene attached to tibia knee reduced.  Patella with a clamp.  Once cement hardened, the knee was reexamined.  Again pleased with alignment, stability, and motion.  Hemovac placed and brought through a separate stab wound.  Arthrotomy closed with #1 Vicryl, skin and subcutaneous tissue with Vicryl and staples.  Sterile compressive dressing applied. Tourniquet deflated and removed.  Knee immobilizer applied.  Anesthesia reversed.  Brought to recovery room.  Tolerated surgery well.  No complications.     Ninetta Lights, M.D.     DFM/MEDQ  D:  01/08/2011  T:  01/08/2011  Job:  [id number]  Electronically Signed by Kathryne Hitch M.D. on 01/29/2011 02:32:26 PM

## 2011-02-12 LAB — TYPE AND SCREEN
ABO/RH(D): O NEG
Antibody Screen: NEGATIVE

## 2011-02-12 LAB — BASIC METABOLIC PANEL
BUN: 13
CO2: 28
Calcium: 8.1 — ABNORMAL LOW
Chloride: 103
Chloride: 99
Creatinine, Ser: 0.81
GFR calc Af Amer: >60
GFR calc Af Amer: >60
GFR calc Af Amer: >60
GFR calc non Af Amer: 57 — ABNORMAL LOW
GFR calc non Af Amer: >60
Glucose, Bld: 112 — ABNORMAL HIGH
Potassium: 4.1
Sodium: 136

## 2011-02-12 LAB — ABO/RH: ABO/RH(D): O NEG

## 2011-02-12 LAB — CBC
HCT: 29.7 — ABNORMAL LOW
HCT: 31.4 — ABNORMAL LOW
HCT: 32.2 — ABNORMAL LOW
Hemoglobin: 10 — ABNORMAL LOW
Hemoglobin: 10.7 — ABNORMAL LOW
Hemoglobin: 13.8
MCHC: 33.4
MCHC: 33.7
MCHC: 34
MCV: 95.8
RBC: 3.12 — ABNORMAL LOW
RBC: 3.36 — ABNORMAL LOW
RBC: 4.39
RDW: 13.3
RDW: 13.8
RDW: 14

## 2011-02-12 LAB — COMPREHENSIVE METABOLIC PANEL
ALT: 16
AST: 27
Alkaline Phosphatase: 68
Calcium: 9.5
GFR calc Af Amer: 55 — ABNORMAL LOW
Glucose, Bld: 110 — ABNORMAL HIGH
Potassium: 4
Sodium: 136
Total Protein: 7.3

## 2011-02-12 LAB — PROTIME-INR
INR: 1.7 — ABNORMAL HIGH
Prothrombin Time: 12.7
Prothrombin Time: 17 — ABNORMAL HIGH

## 2011-02-12 LAB — URINALYSIS, ROUTINE W REFLEX MICROSCOPIC
Glucose, UA: NEGATIVE
Hgb urine dipstick: NEGATIVE
Leukocytes, UA: NEGATIVE
Protein, ur: NEGATIVE
Specific Gravity, Urine: 1.017 (ref 1.005–1.035)
pH: 5.5

## 2011-04-29 HISTORY — PX: CATARACT EXTRACTION: SUR2

## 2011-05-12 ENCOUNTER — Encounter: Payer: Self-pay | Admitting: Internal Medicine

## 2011-05-12 ENCOUNTER — Ambulatory Visit (INDEPENDENT_AMBULATORY_CARE_PROVIDER_SITE_OTHER): Payer: Medicare Other | Admitting: Internal Medicine

## 2011-05-12 DIAGNOSIS — IMO0002 Reserved for concepts with insufficient information to code with codable children: Secondary | ICD-10-CM

## 2011-05-12 DIAGNOSIS — I10 Essential (primary) hypertension: Secondary | ICD-10-CM

## 2011-05-12 DIAGNOSIS — G4700 Insomnia, unspecified: Secondary | ICD-10-CM | POA: Diagnosis not present

## 2011-05-12 DIAGNOSIS — Z79899 Other long term (current) drug therapy: Secondary | ICD-10-CM

## 2011-05-12 DIAGNOSIS — E785 Hyperlipidemia, unspecified: Secondary | ICD-10-CM | POA: Diagnosis not present

## 2011-05-12 DIAGNOSIS — M5410 Radiculopathy, site unspecified: Secondary | ICD-10-CM

## 2011-05-12 MED ORDER — ESTROGENS CONJUGATED 0.3 MG PO TABS: 0.3000 mg | ORAL_TABLET | ORAL | Status: DC

## 2011-05-12 MED ORDER — OMEPRAZOLE 40 MG PO CPDR: 40.0000 mg | DELAYED_RELEASE_CAPSULE | Freq: Every day | ORAL | Status: DC

## 2011-05-12 MED ORDER — MELOXICAM 7.5 MG PO TABS: ORAL_TABLET | ORAL | Status: DC

## 2011-05-12 MED ORDER — QUINAPRIL HCL 20 MG PO TABS: 20.0000 mg | ORAL_TABLET | Freq: Every day | ORAL | Status: DC

## 2011-05-12 MED ORDER — TRIAMTERENE-HCTZ 37.5-25 MG PO TABS: 1.0000 | ORAL_TABLET | Freq: Every day | ORAL | Status: DC

## 2011-05-12 MED ORDER — ROSUVASTATIN CALCIUM 5 MG PO TABS: 5.0000 mg | ORAL_TABLET | ORAL | Status: DC

## 2011-05-12 NOTE — Patient Instructions (Signed)
Please try over the counter valerian for sleep

## 2011-05-13 LAB — HEPATIC FUNCTION PANEL
ALT: 12 U/L (ref 0–35)
AST: 24 U/L (ref 0–37)
Bilirubin, Direct: <0.1 mg/dL (ref 0.0–0.3)
Total Protein: 6.7 g/dL (ref 6.0–8.3)

## 2011-05-13 LAB — CBC WITH DIFFERENTIAL/PLATELET
Basophils Absolute: 0.1 10*3/uL (ref 0.0–0.1)
Eosinophils Relative: 5 % (ref 0–5)
HCT: 34.5 % — ABNORMAL LOW (ref 36.0–46.0)
Hemoglobin: 11 g/dL — ABNORMAL LOW (ref 12.0–15.0)
Lymphocytes Relative: 23 % (ref 12–46)
Lymphs Abs: 1.4 10*3/uL (ref 0.7–4.0)
MCV: 94.3 fL (ref 78.0–100.0)
Monocytes Absolute: 0.7 10*3/uL (ref 0.1–1.0)
Monocytes Relative: 12 % (ref 3–12)
Neutro Abs: 3.7 10*3/uL (ref 1.7–7.7)
RBC: 3.66 MIL/uL — ABNORMAL LOW (ref 3.87–5.11)
RDW: 16.4 % — ABNORMAL HIGH (ref 11.5–15.5)
WBC: 6.2 10*3/uL (ref 4.0–10.5)

## 2011-05-13 LAB — BASIC METABOLIC PANEL
BUN: 46 mg/dL — ABNORMAL HIGH (ref 6–23)
CO2: 21 mEq/L (ref 19–32)
Chloride: 110 mEq/L (ref 96–112)
Creat: 1.66 mg/dL — ABNORMAL HIGH (ref 0.50–1.10)
Potassium: 5.1 mEq/L (ref 3.5–5.3)

## 2011-05-16 DIAGNOSIS — G4700 Insomnia, unspecified: Secondary | ICD-10-CM | POA: Insufficient documentation

## 2011-05-16 DIAGNOSIS — M5410 Radiculopathy, site unspecified: Secondary | ICD-10-CM | POA: Insufficient documentation

## 2011-05-16 NOTE — Assessment & Plan Note (Signed)
Avoid prescription medication. Attempt otc valerian prn.

## 2011-05-16 NOTE — Assessment & Plan Note (Signed)
Obtain lipid/lft. 

## 2011-05-16 NOTE — Progress Notes (Signed)
  Subjective:    Patient ID: Judith Boone, female    DOB: 1931-06-01, 78 y.o.   MRN: [id number]  HPI Pt presents to clinic for followup of multiple medical problems. Notes insomnia related to having to help husband at night. Doesn't want strong medication that would keep her from helping him. Has mild numbness involving left upper leg without injury or leg weakness. Is taking mobic, motrin and aleve without gi adverse effect. No other alleviating or exacerbating factors. No other complaints.   Past Medical History  Diagnosis Date  . Arthritis   . GERD (gastroesophageal reflux disease)    Past Surgical History  Procedure Date  . Cataract extraction   . Broken ankle repair     left  . Total knee arthroplasty     right  . Abdominal hysterectomy     reports that she has quit smoking. She has never used smokeless tobacco. She reports that she drinks alcohol. She reports that she does not use illicit drugs. family history includes Alcohol abuse in her father; Heart disease in her father; and Hypertension in her father. No Known Allergies    Review of Systems see hpi     Objective:   Physical Exam  Nursing note and vitals reviewed. Constitutional: She appears well-developed and well-nourished. No distress.  HENT:  Head: Normocephalic and atraumatic.  Eyes: Conjunctivae are normal. Right eye exhibits no discharge. Left eye exhibits no discharge. No scleral icterus.  Neck: Neck supple.  Cardiovascular: Normal rate, regular rhythm and normal heart sounds.   Pulmonary/Chest: Effort normal and breath sounds normal.  Musculoskeletal:       Bilateral le strength 5/5. Gait nl  Neurological: She is alert.  Skin: Skin is warm and dry. She is not diaphoretic.  Psychiatric: She has a normal mood and affect.          Assessment & Plan:

## 2011-05-16 NOTE — Assessment & Plan Note (Signed)
Normotensive and stable. Continue current regimen. Monitor bp as outpt and followup in clinic as scheduled.  

## 2011-05-16 NOTE — Assessment & Plan Note (Signed)
Stop multiple nsaids. Advised of potential gi and/or renal adverse effect. Take mobic 1-2 po qd prn with food and no other nsaids. Pt avoiding prednisone. Followup if no improvement or worsening.

## 2011-05-19 ENCOUNTER — Other Ambulatory Visit: Payer: Self-pay | Admitting: Internal Medicine

## 2011-05-19 DIAGNOSIS — N289 Disorder of kidney and ureter, unspecified: Secondary | ICD-10-CM

## 2011-06-02 ENCOUNTER — Telehealth: Payer: Self-pay | Admitting: Internal Medicine

## 2011-06-02 NOTE — Telephone Encounter (Signed)
Refill was sent to pharmacy on 05/12/11 #90 x 1 refill. Left detailed message on pharmacy voicemail re: previous authorization and to call if any questions.

## 2011-06-10 ENCOUNTER — Telehealth: Payer: Self-pay | Admitting: Internal Medicine

## 2011-06-10 DIAGNOSIS — N289 Disorder of kidney and ureter, unspecified: Secondary | ICD-10-CM

## 2011-06-10 NOTE — Telephone Encounter (Signed)
#  30. If taking regularly will need to go ahead with repeat chem7 due to elevated cr.

## 2011-06-11 DIAGNOSIS — N289 Disorder of kidney and ureter, unspecified: Secondary | ICD-10-CM | POA: Diagnosis not present

## 2011-06-11 LAB — BASIC METABOLIC PANEL
BUN: 34 mg/dL — ABNORMAL HIGH (ref 6–23)
Calcium: 9.7 mg/dL (ref 8.4–10.5)
Glucose, Bld: 90 mg/dL (ref 70–99)
Potassium: 5.4 mEq/L — ABNORMAL HIGH (ref 3.5–5.3)
Sodium: 141 mEq/L (ref 135–145)

## 2011-06-11 NOTE — Telephone Encounter (Signed)
Call placed to [facility]. Spoke with Levada Dy, she was informed patient should have refill from 05/12/2011 on file with 4 refills remaining. Levada Dy has verified rx on file. No refills are needed at this time.  Call placed to patient at [phone], she was informed of blood work per Dr Elizebeth Koller instructions. She stated that she will have blood work done today.  Lab order printed and sent to lab.

## 2011-06-16 ENCOUNTER — Other Ambulatory Visit: Payer: Self-pay | Admitting: Internal Medicine

## 2011-06-16 DIAGNOSIS — N289 Disorder of kidney and ureter, unspecified: Secondary | ICD-10-CM

## 2011-09-16 ENCOUNTER — Encounter: Payer: Self-pay | Admitting: Internal Medicine

## 2011-09-16 ENCOUNTER — Ambulatory Visit (INDEPENDENT_AMBULATORY_CARE_PROVIDER_SITE_OTHER): Payer: Medicare Other | Admitting: Internal Medicine

## 2011-09-16 VITALS — BP 124/72 | HR 67 | Temp 97.4°F | Resp 16 | Wt 145.0 lb

## 2011-09-16 DIAGNOSIS — H6121 Impacted cerumen, right ear: Secondary | ICD-10-CM

## 2011-09-16 DIAGNOSIS — D649 Anemia, unspecified: Secondary | ICD-10-CM | POA: Diagnosis not present

## 2011-09-16 DIAGNOSIS — H6120 Impacted cerumen, unspecified ear: Secondary | ICD-10-CM

## 2011-09-16 DIAGNOSIS — M1990 Unspecified osteoarthritis, unspecified site: Secondary | ICD-10-CM

## 2011-09-16 DIAGNOSIS — N289 Disorder of kidney and ureter, unspecified: Secondary | ICD-10-CM | POA: Diagnosis not present

## 2011-09-16 DIAGNOSIS — E785 Hyperlipidemia, unspecified: Secondary | ICD-10-CM

## 2011-09-16 DIAGNOSIS — H9190 Unspecified hearing loss, unspecified ear: Secondary | ICD-10-CM

## 2011-09-16 LAB — LIPID PANEL
HDL: 92 mg/dL (ref >39)
LDL Cholesterol: 106 mg/dL — ABNORMAL HIGH (ref 0–99)
Total CHOL/HDL Ratio: 2.3 Ratio
Triglycerides: 69 mg/dL (ref <150)
VLDL: 14 mg/dL (ref 0–40)

## 2011-09-16 LAB — HEPATIC FUNCTION PANEL
Albumin: 4.4 g/dL (ref 3.5–5.2)
Total Bilirubin: 0.4 mg/dL (ref 0.3–1.2)
Total Protein: 6.8 g/dL (ref 6.0–8.3)

## 2011-09-16 LAB — CBC WITH DIFFERENTIAL/PLATELET
Basophils Absolute: 0.1 10*3/uL (ref 0.0–0.1)
HCT: 36.5 % (ref 36.0–46.0)
Lymphocytes Relative: 26 % (ref 12–46)
Lymphs Abs: 1.4 10*3/uL (ref 0.7–4.0)
MCV: 97.1 fL (ref 78.0–100.0)
Neutro Abs: 2.8 10*3/uL (ref 1.7–7.7)
Platelets: 203 10*3/uL (ref 150–400)
RBC: 3.76 MIL/uL — ABNORMAL LOW (ref 3.87–5.11)
RDW: 15.1 % (ref 11.5–15.5)
WBC: 5.3 10*3/uL (ref 4.0–10.5)

## 2011-09-16 LAB — BASIC METABOLIC PANEL
CO2: 24 mEq/L (ref 19–32)
Chloride: 108 mEq/L (ref 96–112)
Potassium: 4.9 mEq/L (ref 3.5–5.3)

## 2011-09-16 MED ORDER — OMEPRAZOLE 40 MG PO CPDR: 40.0000 mg | DELAYED_RELEASE_CAPSULE | Freq: Every day | ORAL | Status: DC

## 2011-09-25 DIAGNOSIS — L570 Actinic keratosis: Secondary | ICD-10-CM | POA: Diagnosis not present

## 2011-09-25 DIAGNOSIS — C44320 Squamous cell carcinoma of skin of unspecified parts of face: Secondary | ICD-10-CM | POA: Diagnosis not present

## 2011-09-25 DIAGNOSIS — D485 Neoplasm of uncertain behavior of skin: Secondary | ICD-10-CM | POA: Diagnosis not present

## 2011-09-25 DIAGNOSIS — C44319 Basal cell carcinoma of skin of other parts of face: Secondary | ICD-10-CM | POA: Diagnosis not present

## 2011-09-27 DIAGNOSIS — N289 Disorder of kidney and ureter, unspecified: Secondary | ICD-10-CM | POA: Insufficient documentation

## 2011-09-27 NOTE — Assessment & Plan Note (Signed)
Attempt ear irrigation. If sx's persist consider audiology referral

## 2011-09-27 NOTE — Assessment & Plan Note (Signed)
Limit nsaid use to minimum. Attempt 1/2 ultram prn

## 2011-09-27 NOTE — Assessment & Plan Note (Signed)
Advised to limit nsaid to minimum usage. Obtain cbc and chem7. If cr worsens stop nsaid

## 2011-09-27 NOTE — Progress Notes (Signed)
  Subjective:    Patient ID: Judith Boone, female    DOB: April 08, 1932, 78 y.o.   MRN: [id number]  HPI Pt presents to clinic for followup of multiple medical problems. Reviewed chronic renal insufficiency. Takes mobic less but still requires medication for OA. 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 Taking mobic ~ q3 days for back pain. Notes bilateral hearing loss progressively.   Past Medical History  Diagnosis Date  . Arthritis   . GERD (gastroesophageal reflux disease)    Past Surgical History  Procedure Date  . Cataract extraction   . Broken ankle repair     left  . Total knee arthroplasty     right  . Abdominal hysterectomy     reports that she has quit smoking. She has never used smokeless tobacco. She reports that she drinks alcohol. She reports that she does not use illicit drugs. family history includes Alcohol abuse in her father; Heart disease in her father; and Hypertension in her father. No Known Allergies    Review of Systems see hpi     Objective:   Physical Exam  Nursing note and vitals reviewed. Constitutional: She appears well-developed and well-nourished. No distress.  HENT:  Head: Normocephalic and atraumatic.  Right Ear: External ear normal.  Left Ear: External ear normal.       Bilateral cerumen  Eyes: Conjunctivae are normal. No scleral icterus.  Cardiovascular: Normal rate, regular rhythm and normal heart sounds.   Pulmonary/Chest: Effort normal and breath sounds normal. No respiratory distress. She has no wheezes. She has no rales.  Neurological: She is alert.  Skin: Skin is warm and dry. She is not diaphoretic.  Psychiatric: She has a normal mood and affect.          Assessment & Plan:

## 2011-10-14 DIAGNOSIS — C44319 Basal cell carcinoma of skin of other parts of face: Secondary | ICD-10-CM | POA: Diagnosis not present

## 2011-10-21 DIAGNOSIS — C44319 Basal cell carcinoma of skin of other parts of face: Secondary | ICD-10-CM | POA: Diagnosis not present

## 2011-10-21 DIAGNOSIS — C44320 Squamous cell carcinoma of skin of unspecified parts of face: Secondary | ICD-10-CM | POA: Diagnosis not present

## 2011-11-28 ENCOUNTER — Other Ambulatory Visit: Payer: Self-pay | Admitting: Internal Medicine

## 2011-11-28 NOTE — Telephone Encounter (Signed)
Rx Done/SLS 

## 2011-12-16 ENCOUNTER — Ambulatory Visit: Payer: Medicare Other | Admitting: Internal Medicine

## 2011-12-23 ENCOUNTER — Telehealth: Payer: Self-pay | Admitting: Internal Medicine

## 2011-12-23 ENCOUNTER — Encounter: Payer: Self-pay | Admitting: Internal Medicine

## 2011-12-23 ENCOUNTER — Ambulatory Visit (INDEPENDENT_AMBULATORY_CARE_PROVIDER_SITE_OTHER): Payer: Medicare Other | Admitting: Internal Medicine

## 2011-12-23 VITALS — BP 102/72 | HR 54 | Temp 97.5°F | Resp 18 | Wt 146.5 lb

## 2011-12-23 DIAGNOSIS — H9190 Unspecified hearing loss, unspecified ear: Secondary | ICD-10-CM

## 2011-12-23 DIAGNOSIS — M129 Arthropathy, unspecified: Secondary | ICD-10-CM | POA: Diagnosis not present

## 2011-12-23 DIAGNOSIS — M1990 Unspecified osteoarthritis, unspecified site: Secondary | ICD-10-CM

## 2011-12-23 DIAGNOSIS — E785 Hyperlipidemia, unspecified: Secondary | ICD-10-CM

## 2011-12-23 DIAGNOSIS — N289 Disorder of kidney and ureter, unspecified: Secondary | ICD-10-CM

## 2011-12-23 LAB — CBC WITH DIFFERENTIAL/PLATELET
Eosinophils Absolute: 0.3 10*3/uL (ref 0.0–0.7)
HCT: 37.2 % (ref 36.0–46.0)
Hemoglobin: 12 g/dL (ref 12.0–15.0)
Lymphs Abs: 1.6 10*3/uL (ref 0.7–4.0)
MCH: 30.9 pg (ref 26.0–34.0)
MCV: 95.9 fL (ref 78.0–100.0)
Monocytes Absolute: 0.9 10*3/uL (ref 0.1–1.0)
Monocytes Relative: 17 % — ABNORMAL HIGH (ref 3–12)
Neutrophils Relative %: 47 % (ref 43–77)
RBC: 3.88 MIL/uL (ref 3.87–5.11)

## 2011-12-23 LAB — BASIC METABOLIC PANEL
Chloride: 106 mEq/L (ref 96–112)
Creat: 1.46 mg/dL — ABNORMAL HIGH (ref 0.50–1.10)
Potassium: 5.2 mEq/L (ref 3.5–5.3)
Sodium: 141 mEq/L (ref 135–145)

## 2011-12-23 NOTE — Patient Instructions (Signed)
Please schedule fasting labs prior to next visit Cbc, chem7-renal insufficiency and lipid/lft-272.4

## 2011-12-23 NOTE — Assessment & Plan Note (Signed)
Attempt to limit nsaid use. reattempt ultram at half dose. Rheumatology consult

## 2011-12-23 NOTE — Assessment & Plan Note (Signed)
Obtain cbc, chem7. Attempt to limit nsaid use

## 2011-12-23 NOTE — Assessment & Plan Note (Signed)
Audiology referral.

## 2011-12-23 NOTE — Progress Notes (Signed)
  Subjective:    Patient ID: Judith Boone, female    DOB: 30-Dec-1931, 78 y.o.   MRN: [id number]  HPI Pt presents to clinic for followup of multiple medical problems. Notes continued diminished hearing despite ear irrigation. Has chronic arthritis pain especially located in the back. Takes mobic recently qd. Has attempted to reduce use due to cri but finds this difficult. Is reluctant to take ultram due to sedation and concerns over impairing her ability to care for her husband.   Past Medical History  Diagnosis Date  . Arthritis   . GERD (gastroesophageal reflux disease)    Past Surgical History  Procedure Date  . Cataract extraction   . Broken ankle repair     left  . Total knee arthroplasty     right  . Abdominal hysterectomy     reports that she has quit smoking. She has never used smokeless tobacco. She reports that she drinks alcohol. She reports that she does not use illicit drugs. family history includes Alcohol abuse in her father; Heart disease in her father; and Hypertension in her father. No Known Allergies   Review of Systems see hpi     Objective:   Physical Exam  Nursing note and vitals reviewed. Constitutional: She appears well-developed and well-nourished. No distress.  HENT:  Head: Normocephalic and atraumatic.  Skin: She is not diaphoretic.          Assessment & Plan:

## 2012-01-01 NOTE — Telephone Encounter (Signed)
Please schedule fasting labs prior to next visit  Cbc, chem7-renal insufficiency and lipid/lft  Future orders placed and copy given to the lab.

## 2012-01-20 ENCOUNTER — Ambulatory Visit: Payer: Medicare Other | Attending: Internal Medicine | Admitting: Audiology

## 2012-01-20 DIAGNOSIS — H9190 Unspecified hearing loss, unspecified ear: Secondary | ICD-10-CM | POA: Diagnosis not present

## 2012-01-26 ENCOUNTER — Encounter: Payer: Self-pay | Admitting: Internal Medicine

## 2012-01-28 DIAGNOSIS — M25549 Pain in joints of unspecified hand: Secondary | ICD-10-CM | POA: Diagnosis not present

## 2012-01-28 DIAGNOSIS — M545 Low back pain, unspecified: Secondary | ICD-10-CM | POA: Diagnosis not present

## 2012-01-28 DIAGNOSIS — M546 Pain in thoracic spine: Secondary | ICD-10-CM | POA: Diagnosis not present

## 2012-01-28 DIAGNOSIS — M25569 Pain in unspecified knee: Secondary | ICD-10-CM | POA: Diagnosis not present

## 2012-02-05 DIAGNOSIS — M2550 Pain in unspecified joint: Secondary | ICD-10-CM | POA: Diagnosis not present

## 2012-02-05 DIAGNOSIS — R300 Dysuria: Secondary | ICD-10-CM | POA: Diagnosis not present

## 2012-02-05 DIAGNOSIS — Z79899 Other long term (current) drug therapy: Secondary | ICD-10-CM | POA: Diagnosis not present

## 2012-02-10 DIAGNOSIS — M48061 Spinal stenosis, lumbar region without neurogenic claudication: Secondary | ICD-10-CM | POA: Diagnosis not present

## 2012-02-10 DIAGNOSIS — Z96659 Presence of unspecified artificial knee joint: Secondary | ICD-10-CM | POA: Diagnosis not present

## 2012-02-10 DIAGNOSIS — Z471 Aftercare following joint replacement surgery: Secondary | ICD-10-CM | POA: Diagnosis not present

## 2012-02-16 DIAGNOSIS — Z8262 Family history of osteoporosis: Secondary | ICD-10-CM | POA: Diagnosis not present

## 2012-02-16 DIAGNOSIS — Z13820 Encounter for screening for osteoporosis: Secondary | ICD-10-CM | POA: Diagnosis not present

## 2012-02-17 DIAGNOSIS — H43819 Vitreous degeneration, unspecified eye: Secondary | ICD-10-CM | POA: Diagnosis not present

## 2012-02-17 DIAGNOSIS — Z961 Presence of intraocular lens: Secondary | ICD-10-CM | POA: Diagnosis not present

## 2012-02-17 DIAGNOSIS — H52209 Unspecified astigmatism, unspecified eye: Secondary | ICD-10-CM | POA: Diagnosis not present

## 2012-02-17 DIAGNOSIS — H01009 Unspecified blepharitis unspecified eye, unspecified eyelid: Secondary | ICD-10-CM | POA: Diagnosis not present

## 2012-02-24 DIAGNOSIS — M545 Low back pain, unspecified: Secondary | ICD-10-CM | POA: Diagnosis not present

## 2012-02-24 DIAGNOSIS — M25569 Pain in unspecified knee: Secondary | ICD-10-CM | POA: Diagnosis not present

## 2012-02-24 DIAGNOSIS — M546 Pain in thoracic spine: Secondary | ICD-10-CM | POA: Diagnosis not present

## 2012-02-24 DIAGNOSIS — M25549 Pain in joints of unspecified hand: Secondary | ICD-10-CM | POA: Diagnosis not present

## 2012-03-10 ENCOUNTER — Telehealth: Payer: Self-pay | Admitting: *Deleted

## 2012-03-10 NOTE — Telephone Encounter (Signed)
Message [staff]    Outside bmd results reviewed. No significant change from last exam. Show mild bone loss but not osteoporosis. Recommend f/u test in 2 years   Patient informed/SLS

## 2012-03-17 DIAGNOSIS — E785 Hyperlipidemia, unspecified: Secondary | ICD-10-CM | POA: Diagnosis not present

## 2012-03-17 DIAGNOSIS — N289 Disorder of kidney and ureter, unspecified: Secondary | ICD-10-CM | POA: Diagnosis not present

## 2012-03-17 LAB — CBC WITH DIFFERENTIAL/PLATELET
Basophils Absolute: 0.1 10*3/uL (ref 0.0–0.1)
Basophils Relative: 1 % (ref 0–1)
Eosinophils Relative: 4 % (ref 0–5)
HCT: 37 % (ref 36.0–46.0)
MCHC: 32.7 g/dL (ref 30.0–36.0)
MCV: 96.4 fL (ref 78.0–100.0)
Monocytes Absolute: 0.9 10*3/uL (ref 0.1–1.0)
RDW: 14.4 % (ref 11.5–15.5)

## 2012-03-17 LAB — BASIC METABOLIC PANEL
CO2: 26 mEq/L (ref 19–32)
Calcium: 10.1 mg/dL (ref 8.4–10.5)
Glucose, Bld: 112 mg/dL — ABNORMAL HIGH (ref 70–99)
Potassium: 4.7 mEq/L (ref 3.5–5.3)
Sodium: 139 mEq/L (ref 135–145)

## 2012-03-17 LAB — LIPID PANEL
Cholesterol: 198 mg/dL (ref 0–200)
Triglycerides: 79 mg/dL (ref <150)

## 2012-03-17 LAB — HEPATIC FUNCTION PANEL
ALT: 14 U/L (ref 0–35)
Total Protein: 6.7 g/dL (ref 6.0–8.3)

## 2012-03-17 NOTE — Addendum Note (Signed)
Addended by: Kelle Darting A on: 03/17/2012 11:18 AM   Modules accepted: Orders

## 2012-03-17 NOTE — Telephone Encounter (Signed)
Pt presented to the lab, future orders released. 

## 2012-03-19 ENCOUNTER — Encounter: Payer: Self-pay | Admitting: Internal Medicine

## 2012-03-23 ENCOUNTER — Other Ambulatory Visit: Payer: Self-pay | Admitting: Internal Medicine

## 2012-03-23 ENCOUNTER — Ambulatory Visit (INDEPENDENT_AMBULATORY_CARE_PROVIDER_SITE_OTHER): Payer: Medicare Other | Admitting: Internal Medicine

## 2012-03-23 ENCOUNTER — Telehealth: Payer: Self-pay | Admitting: Internal Medicine

## 2012-03-23 ENCOUNTER — Ambulatory Visit (HOSPITAL_BASED_OUTPATIENT_CLINIC_OR_DEPARTMENT_OTHER)
Admission: RE | Admit: 2012-03-23 | Discharge: 2012-03-23 | Disposition: A | Discharge status: Home / Self Care | Payer: Medicare Other | Source: Ambulatory Visit | Attending: Internal Medicine | Admitting: Internal Medicine

## 2012-03-23 ENCOUNTER — Encounter: Payer: Self-pay | Admitting: Internal Medicine

## 2012-03-23 VITALS — BP 138/86 | HR 78 | Temp 97.4°F | Resp 16 | Wt 147.8 lb

## 2012-03-23 DIAGNOSIS — N289 Disorder of kidney and ureter, unspecified: Secondary | ICD-10-CM

## 2012-03-23 DIAGNOSIS — M79676 Pain in unspecified toe(s): Secondary | ICD-10-CM | POA: Insufficient documentation

## 2012-03-23 DIAGNOSIS — M1990 Unspecified osteoarthritis, unspecified site: Secondary | ICD-10-CM

## 2012-03-23 DIAGNOSIS — M7989 Other specified soft tissue disorders: Secondary | ICD-10-CM

## 2012-03-23 DIAGNOSIS — K219 Gastro-esophageal reflux disease without esophagitis: Secondary | ICD-10-CM | POA: Diagnosis not present

## 2012-03-23 DIAGNOSIS — M79609 Pain in unspecified limb: Secondary | ICD-10-CM | POA: Diagnosis not present

## 2012-03-23 DIAGNOSIS — M129 Arthropathy, unspecified: Secondary | ICD-10-CM | POA: Diagnosis not present

## 2012-03-23 MED ORDER — OMEPRAZOLE 40 MG PO CPDR: 40.0000 mg | DELAYED_RELEASE_CAPSULE | Freq: Every day | ORAL | Status: DC

## 2012-03-23 NOTE — Assessment & Plan Note (Signed)
Off Mobic. Seeing rheumatology. With improved creatinine agree with sparing when necessary over-the-counter Motrin

## 2012-03-23 NOTE — Assessment & Plan Note (Signed)
Given unilateral presentation obtained lower extremity ultrasound rule out DVT. If negative suspect venous insufficiency contribution. Elevate leg and use compression hose.

## 2012-03-23 NOTE — Telephone Encounter (Signed)
Lab order week of 06-14-2012 Please schedule fasting labs prior to next visit  Chem7, a1c-hyperglycemia, uric acid-arthralgia

## 2012-03-23 NOTE — Patient Instructions (Signed)
Please schedule fasting labs prior to next visit Chem7, a1c-hyperglycemia, uric acid-arthralgia

## 2012-03-23 NOTE — Assessment & Plan Note (Signed)
Symptoms not strongly suggestive of gout. Obtain uric acid with next visit. Suspect arthritic changes of joint.

## 2012-03-23 NOTE — Assessment & Plan Note (Signed)
Improved off Mobic. Use OTC NSAID sparingly. Repeat Chem-7 next visit.

## 2012-03-23 NOTE — Assessment & Plan Note (Signed)
Change omeprazole to 20 mg daily. Notify clinic if develops breakthrough symptoms

## 2012-03-23 NOTE — Progress Notes (Signed)
  Subjective:    Patient ID: Judith Boone, female    DOB: [birth year], 78 y.o.   MRN: [id number]  HPI Pt presents to clinic for followup of multiple medical problems. Notes two-week history of left lower extremity swelling without chest pain shortness of breath. No obvious risk factor for the VTE. Notes left first toe joint pain. Does not occur with attacks or with associated inflammatory changes of the joint. Wonders about gout. Reviewed improved creatinine off Mobic. Sees rheumatology now for arthritis. Takes over-the-counter Motrin sparingly. Insurance will no longer cover 40 mg of omeprazole however we'll cover 20 mg.  Past Medical History  Diagnosis Date  . Arthritis   . GERD (gastroesophageal reflux disease)    Past Surgical History  Procedure Date  . Cataract extraction   . Broken ankle repair     left  . Total knee arthroplasty     right  . Abdominal hysterectomy     reports that she has quit smoking. She has never used smokeless tobacco. She reports that she drinks alcohol. She reports that she does not use illicit drugs. family history includes Alcohol abuse in her father; Heart disease in her father; and Hypertension in her father. No Known Allergies    Review of Systems see hpi     Objective:   Physical Exam  Nursing note and vitals reviewed. Constitutional: She appears well-developed and well-nourished. No distress.  HENT:  Head: Normocephalic and atraumatic.  Eyes: Conjunctivae normal are normal. No scleral icterus.  Neck: Neck supple.  Cardiovascular: Normal rate, regular rhythm and normal heart sounds.   Pulmonary/Chest: Effort normal and breath sounds normal. No respiratory distress. She has no wheezes. She has no rales.  Neurological: She is alert.  Skin: Skin is warm and dry. She is not diaphoretic.       Left lower extremity with +1 edema. Noted venous Rakowski's. Negative Homans. Gait normal. No palpable cords.  Psychiatric: She has a normal mood and affect.           Assessment & Plan:

## 2012-03-26 ENCOUNTER — Other Ambulatory Visit: Payer: Self-pay | Admitting: *Deleted

## 2012-03-26 MED ORDER — OMEPRAZOLE 20 MG PO CPDR: 20.0000 mg | DELAYED_RELEASE_CAPSULE | Freq: Every day | ORAL | Status: DC

## 2012-03-26 NOTE — Telephone Encounter (Signed)
Received notification that Insurance will not cover Omeprazole 40 mg; Ok per VO TWH change to Omperazole 20 mg BID; Rx to pharmacy & pt informed/SLS

## 2012-05-04 DIAGNOSIS — I789 Disease of capillaries, unspecified: Secondary | ICD-10-CM | POA: Diagnosis not present

## 2012-05-04 DIAGNOSIS — L570 Actinic keratosis: Secondary | ICD-10-CM | POA: Diagnosis not present

## 2012-05-04 DIAGNOSIS — Z85828 Personal history of other malignant neoplasm of skin: Secondary | ICD-10-CM | POA: Diagnosis not present

## 2012-05-10 ENCOUNTER — Other Ambulatory Visit: Payer: Self-pay | Admitting: Internal Medicine

## 2012-05-11 NOTE — Telephone Encounter (Signed)
Rx[s] to pharmacy/SLS 

## 2012-05-19 DIAGNOSIS — M67919 Unspecified disorder of synovium and tendon, unspecified shoulder: Secondary | ICD-10-CM | POA: Diagnosis not present

## 2012-05-19 DIAGNOSIS — M19049 Primary osteoarthritis, unspecified hand: Secondary | ICD-10-CM | POA: Diagnosis not present

## 2012-05-19 DIAGNOSIS — M719 Bursopathy, unspecified: Secondary | ICD-10-CM | POA: Diagnosis not present

## 2012-05-19 DIAGNOSIS — G4700 Insomnia, unspecified: Secondary | ICD-10-CM | POA: Diagnosis not present

## 2012-05-19 DIAGNOSIS — M1710 Unilateral primary osteoarthritis, unspecified knee: Secondary | ICD-10-CM | POA: Diagnosis not present

## 2012-06-12 ENCOUNTER — Other Ambulatory Visit: Payer: Self-pay

## 2012-06-15 ENCOUNTER — Telehealth: Payer: Self-pay

## 2012-06-15 ENCOUNTER — Other Ambulatory Visit: Payer: Self-pay

## 2012-06-15 DIAGNOSIS — R739 Hyperglycemia, unspecified: Secondary | ICD-10-CM

## 2012-06-15 DIAGNOSIS — R7309 Other abnormal glucose: Secondary | ICD-10-CM | POA: Diagnosis not present

## 2012-06-15 DIAGNOSIS — M2550 Pain in unspecified joint: Secondary | ICD-10-CM | POA: Diagnosis not present

## 2012-06-15 DIAGNOSIS — N289 Disorder of kidney and ureter, unspecified: Secondary | ICD-10-CM | POA: Diagnosis not present

## 2012-06-15 LAB — HEMOGLOBIN A1C
Hgb A1c MFr Bld: 6.1 % — ABNORMAL HIGH (ref <5.7)
Mean Plasma Glucose: 128 mg/dL — ABNORMAL HIGH (ref <117)

## 2012-06-15 NOTE — Telephone Encounter (Signed)
Labs printed.

## 2012-06-16 LAB — BASIC METABOLIC PANEL
Chloride: 107 mEq/L (ref 96–112)
Potassium: 5.4 mEq/L — ABNORMAL HIGH (ref 3.5–5.3)
Sodium: 140 mEq/L (ref 135–145)

## 2012-06-16 LAB — URIC ACID: Uric Acid, Serum: 6.1 mg/dL (ref 2.4–7.0)

## 2012-06-16 NOTE — Telephone Encounter (Signed)
Quick Note:  Patient Informed and voiced understanding ______ 

## 2012-06-22 ENCOUNTER — Ambulatory Visit (INDEPENDENT_AMBULATORY_CARE_PROVIDER_SITE_OTHER): Payer: Medicare Other | Admitting: Family Medicine

## 2012-06-22 ENCOUNTER — Encounter: Payer: Self-pay | Admitting: Family Medicine

## 2012-06-22 VITALS — BP 118/75 | HR 75 | Temp 97.5°F | Ht 60.5 in | Wt 137.0 lb

## 2012-06-22 DIAGNOSIS — K219 Gastro-esophageal reflux disease without esophagitis: Secondary | ICD-10-CM

## 2012-06-22 DIAGNOSIS — I10 Essential (primary) hypertension: Secondary | ICD-10-CM

## 2012-06-22 DIAGNOSIS — N289 Disorder of kidney and ureter, unspecified: Secondary | ICD-10-CM

## 2012-06-22 LAB — RENAL FUNCTION PANEL
Calcium: 9.8 mg/dL (ref 8.4–10.5)
Chloride: 107 mEq/L (ref 96–112)
Phosphorus: 4 mg/dL (ref 2.3–4.6)
Potassium: 4.7 mEq/L (ref 3.5–5.3)
Sodium: 141 mEq/L (ref 135–145)

## 2012-06-22 NOTE — Assessment & Plan Note (Signed)
Mildly up, encouraged slight increased hydration

## 2012-06-22 NOTE — Patient Instructions (Addendum)
Labs prior to visit. Lipid, renal, cbc, tsh, hepatic  Chronic Renal Insufficiency Chronic renal insufficiency (also called kidney failure) occurs when there is kidney damage done. The damage prevents the kidneys from working like they should.  The kidneys do many important things. They:  Filter waste out of the blood.  Regulate the amount of water and various salts in the blood stream.  Produce chemicals that:  Prompt the bone marrow to make red blood cells.  Regulate blood pressure.  Keep calcium in balance throughout the bones and the body. When the kidneys are damaged, they can no longer filter waste products out of the blood. These substances build up in the blood, causing illness.  CAUSES   Diabetes.  High blood pressure.  Glomerular diseases: Conditions that damage the tiny blood vessels (glomeruli) within the kidneys, such as:  Membranous nephropathy.  IgA nephropathy.  Focal segmental glomerulosclerosis.  Poisons (such as overdoses or misuse of acetaminophen or NSAIDS, or exposure to other toxic substances).  Kidney injuries.  Kidney cancer or cancer that spreads to the kidney.  Medications such as NSAIDs. These problems are rare.  Kidney stones.  Alport disease.  Polycystic kidneys. SYMPTOMS  Most people do not notice symptoms of kidney failure until their kidney function drops below about 30-40% of normal. Symptoms can include:  Weakness.  Tiredness.  Frequent urination.  Intense need to urinate.  Excess bruising.  Low urine production.  Blood in the urine.  Pain in the kidney area.  Feeling sick to your stomach (nausea).  Vomiting.  Unusual bleeding.  Numbness in hands and feet.  Swelling in legs, arms and face.  Confusion. DIAGNOSIS  Your caregiver will look for signs of kidney failure. Tests to diagnose kidney failure may include:  Urine tests: May reveal the presence of blood, protein or sugar.  Blood tests: May show low  red blood cell count (anemia) or high levels of waste products (BUN and creatinine) that are normally filtered out of the bloodstream by the kidneys.  Imaging tests  These are tests that create pictures of the organs inside the abdomen, such as the kidneys. They may reveal masses growing in the kidneys or blockages to the flow of urine. Possible imaging tests may include:  Ultrasound.  CT scan.  MRI.  Intravenous pyelogram or IVP. This is a test that involves injecting dye into the bloodstream and then taking a series of x-rays of the kidneys. This allows the kidneys and other parts of the urinary system to be viewed more clearly.  Kidney biopsy  A small sample of kidney is removed using a special needle. The sample is examined for abnormalities under a microscope. TREATMENT  Chronic kidney failure cannot usually be cured. The various symptoms are treated, and measures are taken to avoid further kidney damage. Treatment for mild to moderate kidney failure may include:  Medication for high blood pressure.  Good control of diabetes.  Medication and diet change to improve anemia.  A low-sodium, low-potassium, low-protein and/or low-cholesterol diet.  Limiting the quantity of liquids in the diet. Treatment for more severe kidney failure may require:  Dialysis  Mechanical methods of filtering the blood.  Kidney transplant  An operation that removes the diseased kidney and replaces it with a donated kidney. HOME CARE INSTRUCTIONS   Take medication as told by your caregiver.  Quit smoking if you are a smoker. Talk to your caregiver about a smoking cessation program.  Follow your prescribed diet.  If you are prescribed  vitamins, take them as told. SEEK IMMEDIATE MEDICAL CARE IF:  You start to produce less urine.  You notice blood in your urine.  You have increased pain.  You have increased weakness, fatigue or confusion.  You notice new swelling.  You develop a  fever.  You feel that you are having side effects of medicines prescribed. Document Released: 01/22/2008 Document Revised: 07/07/2011 Document Reviewed: 05/06/2010 [facility] Patient Information 2013 [place].

## 2012-06-27 NOTE — Assessment & Plan Note (Signed)
Well controlled, no changes 

## 2012-06-27 NOTE — Progress Notes (Signed)
Patient ID: Judith Boone, female   DOB: [birth year], 79 y.o.   MRN: [id number] Judith Boone [id number] Jun 21, 1931 06/27/2012      Progress Note-Follow Up  Subjective  Chief Complaint  Chief Complaint  Patient presents with  . Follow-up    3 month    HPI  Patient is a 79 year old female who is in today for followup and is doing well. She does continue to have troubles heartburn. It was worse over the holidays anxious and wanted to improve somewhat his is on tramadol at night to help her pain if she sleeps well. No recent fevers or chills. No headache, chest pain, palpitations, shortness of breath.  Past Medical History  Diagnosis Date  . Arthritis   . GERD (gastroesophageal reflux disease)     Past Surgical History  Procedure Laterality Date  . Cataract extraction    . Broken ankle repair      left  . Total knee arthroplasty      right  . Abdominal hysterectomy      Family History  Problem Relation Age of Onset  . Alcohol abuse Father   . Heart disease Father   . Hypertension Father     History   Social History  . Marital Status: Married    Spouse Name: N/A    Number of Children: N/A  . Years of Education: N/A   Occupational History  . Not on file.   Social History Main Topics  . Smoking status: Former Research scientist (life sciences)  . Smokeless tobacco: Never Used  . Alcohol Use: Yes  . Drug Use: No  . Sexually Active: Not on file   Other Topics Concern  . Not on file   Social History Narrative  . No narrative on file    Current Outpatient Prescriptions on File Prior to Visit  Medication Sig Dispense Refill  . Ascorbic Acid (VITAMIN C) 500 MG tablet Take 500 mg by mouth daily. 2 per day       . aspirin 81 MG tablet Take 81 mg by mouth 3 (three) times a week.        . calcium carbonate (OS-CAL) 600 MG TABS Take 600 mg by mouth 2 (two) times daily with a meal.        . cetirizine (ZYRTEC) 10 MG tablet Take 10 mg by mouth daily.        . Cholecalciferol (VITAMIN D3) 1000 UNITS  CAPS Take by mouth. 2 per day       . Glucosamine-Chondroit-Vit C-Mn (GLUCOSAMINE 1500 COMPLEX PO) Take 1,500 mg by mouth. 3 per day       . Multiple Vitamins-Minerals ([facility] SPECTRAVITE PO) Take by mouth daily.        . quinapril (ACCUPRIL) 20 MG tablet TAKE 1 TABLET AT BEDTIME  90 tablet  1  . rosuvastatin (CRESTOR) 5 MG tablet Take 1 tablet (5 mg total) by mouth 2 (two) times a week.  60 tablet  1  . triamterene-hydrochlorothiazide (MAXZIDE-25) 37.5-25 MG per tablet TAKE 1 EACH (1 TABLET TOTAL) BY MOUTH DAILY.  90 tablet  1   No current facility-administered medications on file prior to visit.    No Known Allergies  Review of Systems  Review of Systems  Constitutional: Negative for fever and malaise/fatigue.  HENT: Negative for congestion.   Eyes: Negative for discharge.  Respiratory: Negative for shortness of breath.   Cardiovascular: Negative for chest pain, palpitations and leg swelling.  Gastrointestinal: Positive for heartburn. Negative for nausea,  abdominal pain and diarrhea.  Genitourinary: Negative for dysuria.  Musculoskeletal: Negative for falls.  Skin: Negative for rash.  Neurological: Negative for loss of consciousness and headaches.  Endo/Heme/Allergies: Negative for polydipsia.  Psychiatric/Behavioral: Negative for depression and suicidal ideas. The patient is not nervous/anxious and does not have insomnia.     Objective  BP 118/75  Pulse 75  Temp(Src) 97.5 F (36.4 C) (Oral)  Ht 5' 0.5" (1.537 m)  Wt 137 lb (62.143 kg)  BMI 26.31 kg/m2  SpO2 94%  Physical Exam  Physical Exam  Constitutional: She is oriented to person, place, and time and well-developed, well-nourished, and in no distress. No distress.  HENT:  Head: Normocephalic and atraumatic.  Eyes: Conjunctivae are normal.  Neck: Neck supple. No thyromegaly present.  Cardiovascular: Normal rate, regular rhythm and normal heart sounds.   No murmur heard. Pulmonary/Chest: Effort normal and breath  sounds normal. She has no wheezes.  Abdominal: She exhibits no distension and no mass.  Musculoskeletal: She exhibits no edema.  Lymphadenopathy:    She has no cervical adenopathy.  Neurological: She is alert and oriented to person, place, and time.  Skin: Skin is warm and dry. No rash noted. She is not diaphoretic.  Psychiatric: Memory, affect and judgment normal.    Lab Results  Component Value Date   TSH 1.19 12/13/2010   Lab Results  Component Value Date   WBC 6.6 03/17/2012   HGB 12.1 03/17/2012   HCT 37.0 03/17/2012   MCV 96.4 03/17/2012   PLT 197 03/17/2012   Lab Results  Component Value Date   CREATININE 1.37* 06/22/2012   BUN 40* 06/22/2012   NA 141 06/22/2012   K 4.7 06/22/2012   CL 107 06/22/2012   CO2 22 06/22/2012   Lab Results  Component Value Date   ALT 14 03/17/2012   AST 25 03/17/2012   ALKPHOS 75 03/17/2012   BILITOT 0.5 03/17/2012   Lab Results  Component Value Date   CHOL 198 03/17/2012   Lab Results  Component Value Date   HDL 84 03/17/2012   Lab Results  Component Value Date   LDLCALC 98 03/17/2012   Lab Results  Component Value Date   TRIG 79 03/17/2012   Lab Results  Component Value Date   CHOLHDL 2.4 03/17/2012     Assessment & Plan  Renal insufficiency Mildly up, encouraged slight increased hydration  GERD (gastroesophageal reflux disease) Recent flare, referred to GI and encouraged to continue Omeprazole and add an H2 blocker., avoid offending foods.  HTN (hypertension) Well controlled, no changes.

## 2012-06-27 NOTE — Assessment & Plan Note (Signed)
Recent flare, referred to GI and encouraged to continue Omeprazole and add an H2 blocker., avoid offending foods.

## 2012-07-15 DIAGNOSIS — K219 Gastro-esophageal reflux disease without esophagitis: Secondary | ICD-10-CM | POA: Diagnosis not present

## 2012-07-23 ENCOUNTER — Other Ambulatory Visit: Payer: Self-pay | Admitting: Internal Medicine

## 2012-07-23 NOTE — Telephone Encounter (Signed)
Pt

## 2012-07-23 NOTE — Telephone Encounter (Signed)
Rx request to pharmacy/SLS  

## 2012-07-23 NOTE — Telephone Encounter (Signed)
Insurance will pay for #8/30 day per pharmacy; 90-day #24x0 rf sent/SLS

## 2012-07-26 ENCOUNTER — Ambulatory Visit (INDEPENDENT_AMBULATORY_CARE_PROVIDER_SITE_OTHER): Payer: Medicare Other | Admitting: Internal Medicine

## 2012-07-26 ENCOUNTER — Encounter: Payer: Self-pay | Admitting: Internal Medicine

## 2012-07-26 VITALS — BP 152/82 | HR 86 | Temp 96.7°F

## 2012-07-26 DIAGNOSIS — M5430 Sciatica, unspecified side: Secondary | ICD-10-CM | POA: Diagnosis not present

## 2012-07-26 DIAGNOSIS — M5431 Sciatica, right side: Secondary | ICD-10-CM

## 2012-07-26 MED ORDER — PREDNISONE 10 MG PO TABS: ORAL_TABLET | ORAL | Status: DC

## 2012-07-26 MED ORDER — METHYLPREDNISOLONE ACETATE 80 MG/ML IJ SUSP
80.0000 mg | Freq: Once | INTRAMUSCULAR | Status: AC
  Administered 2012-07-26: 80 mg via INTRAMUSCULAR

## 2012-07-26 NOTE — Progress Notes (Signed)
Subjective:    Patient ID: Judith Boone, female    DOB: 03/14/1932, 79 y.o.   MRN: [id number]  HPI  Pt presents to the clinic today with c/o right leg pain x 10 days. The pain starts in the middle of her back, then runs through her right buttock down into her right leg. The pains are sharp and shooting. She describes it as a 9/10. She has never had pain like this before. She denies having a back injury but she does have arthritis and scoliosis of her back. She has been taking some Advil for this pain. She denies loss of bowel or bladder.  Review of Systems      Past Medical History  Diagnosis Date  . Arthritis   . GERD (gastroesophageal reflux disease)     Current Outpatient Prescriptions  Medication Sig Dispense Refill  . Ascorbic Acid (VITAMIN C) 500 MG tablet Take 500 mg by mouth daily. 2 per day       . aspirin 81 MG tablet Take 81 mg by mouth 3 (three) times a week.        . calcium carbonate (OS-CAL) 600 MG TABS Take 600 mg by mouth 2 (two) times daily with a meal.        . cetirizine (ZYRTEC) 10 MG tablet Take 10 mg by mouth daily.        . Cholecalciferol (VITAMIN D3) 1000 UNITS CAPS Take by mouth. 2 per day       . CRESTOR 5 MG tablet TAKE 1 TALET TWICE A WEEK  24 tablet  0  . estrogens, conjugated, (PREMARIN) 0.3 MG tablet Take 0.3 mg by mouth 2 (two) times a week.      . Glucosamine-Chondroit-Vit C-Mn (GLUCOSAMINE 1500 COMPLEX PO) Take 1,500 mg by mouth. 3 per day       . Multiple Vitamins-Minerals ([facility] SPECTRAVITE PO) Take by mouth daily.        Marland Kitchen omeprazole (PRILOSEC) 20 MG capsule       . quinapril (ACCUPRIL) 20 MG tablet TAKE 1 TABLET AT BEDTIME  90 tablet  1  . traMADol (ULTRAM) 50 MG tablet Take 50 mg by mouth at bedtime.      . triamterene-hydrochlorothiazide (MAXZIDE-25) 37.5-25 MG per tablet TAKE 1 EACH (1 TABLET TOTAL) BY MOUTH DAILY.  90 tablet  1  . predniSONE (DELTASONE) 10 MG tablet Take 3 tablets on days 1-2, take 2 tablets on days 3-4, take 1 tablet on days  5-6  12 tablet  0   No current facility-administered medications for this visit.    No Known Allergies  Family History  Problem Relation Age of Onset  . Alcohol abuse Father   . Heart disease Father   . Hypertension Father     History   Social History  . Marital Status: Married    Spouse Name: N/A    Number of Children: N/A  . Years of Education: N/A   Occupational History  . Not on file.   Social History Main Topics  . Smoking status: Former Research scientist (life sciences)  . Smokeless tobacco: Never Used  . Alcohol Use: Yes  . Drug Use: No  . Sexually Active: Not on file   Other Topics Concern  . Not on file   Social History Narrative  . No narrative on file     Constitutional: Denies fever, malaise, fatigue, headache or abrupt weight changes.  Musculoskeletal: Pt reports low back pain and difficulty with gait. Denies decrease in range  of motion, muscle pain or joint pain and swelling.  Neurological: Pt reports sharp, shooting pains into her right leg. Denies dizziness, difficulty with memory, difficulty with speech or problems with balance and coordination.   No other specific complaints in a complete review of systems (except as listed in HPI above).  Objective:   Physical Exam  BP 152/82  Pulse 86  Temp(Src) 96.7 F (35.9 C) (Oral)  SpO2 97% Wt Readings from Last 3 Encounters:  06/22/12 137 lb (62.143 kg)  03/23/12 147 lb 12 oz (67.019 kg)  12/23/11 146 lb 8 oz (66.452 kg)    General: Appears her stated age, well developed, well nourished in NAD. Cardiovascular: Normal rate and rhythm. S1,S2 noted.  No murmur, rubs or gallops noted. No JVD or BLE edema. No carotid bruits noted. Pulmonary/Chest: Normal effort and positive vesicular breath sounds. No respiratory distress. No wheezes, rales or ronchi noted.  Musculoskeletal: Decreased range of motion of the back. No signs of joint swelling. Mild difficulty with gait.  Neurological: Alert and oriented. Cranial nerves II-XII  intact. Coordination normal. +DTRs bilaterally. Positive straight leg raise.        Assessment & Plan:   Sciatica, new onset:  Pt declines xray today 80 mg Depo IM given today eRx for 6 day pred taper, start tomorrow  RTC as needed or if symptoms persist, get worse, or you lose bowel or bladder control

## 2012-07-26 NOTE — Patient Instructions (Signed)
Sciatica with Rehab The sciatic nerve runs from the back down the leg and is responsible for sensation and control of the muscles in the back (posterior) side of the thigh, lower leg, and foot. Sciatica is a condition that is characterized by inflammation of this nerve.  SYMPTOMS   Signs of nerve damage, including numbness and/or weakness along the posterior side of the lower extremity.  Pain in the back of the thigh that may also travel down the leg.  Pain that worsens when sitting for long periods of time.  Occasionally, pain in the back or buttock. CAUSES  Inflammation of the sciatic nerve is the cause of sciatica. The inflammation is due to something irritating the nerve. Common sources of irritation include:  Sitting for long periods of time.  Direct trauma to the nerve.  Arthritis of the spine.  Herniated or ruptured disk.  Slipping of the vertebrae (spondylolithesis)  Pressure from soft tissues, such as muscles or ligament-like tissue (fascia). RISK INCREASES WITH:  Sports that place pressure or stress on the spine (football or weightlifting).  Poor strength and flexibility.  Failure to warm-up properly before activity.  Family history of low back pain or disk disorders.  Previous back injury or surgery.  Poor body mechanics, especially when lifting, or poor posture. PREVENTION   Warm up and stretch properly before activity.  Maintain physical fitness:  Strength, flexibility, and endurance.  Cardiovascular fitness.  Learn and use proper technique, especially with posture and lifting. When possible, have coach correct improper technique.  Avoid activities that place stress on the spine. PROGNOSIS If treated properly, then sciatica usually resolves within 6 weeks. However, occasionally surgery is necessary.  RELATED COMPLICATIONS   Permanent nerve damage, including pain, numbness, tingle, or weakness.  Chronic back pain.  Risks of surgery: infection,  bleeding, nerve damage, or damage to surrounding tissues. TREATMENT Treatment initially involves resting from any activities that aggravate your symptoms. The use of ice and medication may help reduce pain and inflammation. The use of strengthening and stretching exercises may help reduce pain with activity. These exercises may be performed at home or with referral to a therapist. A therapist may recommend further treatments, such as transcutaneous electronic nerve stimulation (TENS) or ultrasound. Your caregiver may recommend corticosteroid injections to help reduce inflammation of the sciatic nerve. If symptoms persist despite non-surgical (conservative) treatment, then surgery may be recommended. MEDICATION  If pain medication is necessary, then nonsteroidal anti-inflammatory medications, such as aspirin and ibuprofen, or other minor pain relievers, such as acetaminophen, are often recommended.  Do not take pain medication for 7 days before surgery.  Prescription pain relievers may be given if deemed necessary by your caregiver. Use only as directed and only as much as you need.  Ointments applied to the skin may be helpful.  Corticosteroid injections may be given by your caregiver. These injections should be reserved for the most serious cases, because they may only be given a certain number of times. HEAT AND COLD  Cold treatment (icing) relieves pain and reduces inflammation. Cold treatment should be applied for 10 to 15 minutes every 2 to 3 hours for inflammation and pain and immediately after any activity that aggravates your symptoms. Use ice packs or massage the area with a piece of ice (ice massage).  Heat treatment may be used prior to performing the stretching and strengthening activities prescribed by your caregiver, physical therapist, or athletic trainer. Use a heat pack or soak the injury in warm water. SEEK  MEDICAL CARE IF:  Treatment seems to offer no benefit, or the condition  worsens.  Any medications produce adverse side effects. EXERCISES  RANGE OF MOTION (ROM) AND STRETCHING EXERCISES - Sciatica Most people with sciatic will find that their symptoms worsen with either excessive bending forward (flexion) or arching at the low back (extension). The exercises which will help resolve your symptoms will focus on the opposite motion. Your physician, physical therapist or athletic trainer will help you determine which exercises will be most helpful to resolve your low back pain. Do not complete any exercises without first consulting with your clinician. Discontinue any exercises which worsen your symptoms until you speak to your clinician. If you have pain, numbness or tingling which travels down into your buttocks, leg or foot, the goal of the therapy is for these symptoms to move closer to your back and eventually resolve. Occasionally, these leg symptoms will get better, but your low back pain may worsen; this is typically an indication of progress in your rehabilitation. Be certain to be very alert to any changes in your symptoms and the activities in which you participated in the 24 hours prior to the change. Sharing this information with your clinician will allow him/her to most efficiently treat your condition. These exercises may help you when beginning to rehabilitate your injury. Your symptoms may resolve with or without further involvement from your physician, physical therapist or athletic trainer. While completing these exercises, remember:   Restoring tissue flexibility helps normal motion to return to the joints. This allows healthier, less painful movement and activity.  An effective stretch should be held for at least 30 seconds.  A stretch should never be painful. You should only feel a gentle lengthening or release in the stretched tissue. FLEXION RANGE OF MOTION AND STRETCHING EXERCISES: STRETCH  Flexion, Single Knee to Chest   Lie on a firm bed or floor  with both legs extended in front of you.  Keeping one leg in contact with the floor, bring your opposite knee to your chest. Hold your leg in place by either grabbing behind your thigh or at your knee.  Pull until you feel a gentle stretch in your low back. Hold __________ seconds.  Slowly release your grasp and repeat the exercise with the opposite side. Repeat __________ times. Complete this exercise __________ times per day.  STRETCH  Flexion, Double Knee to Chest  Lie on a firm bed or floor with both legs extended in front of you.  Keeping one leg in contact with the floor, bring your opposite knee to your chest.  Tense your stomach muscles to support your back and then lift your other knee to your chest. Hold your legs in place by either grabbing behind your thighs or at your knees.  Pull both knees toward your chest until you feel a gentle stretch in your low back. Hold __________ seconds.  Tense your stomach muscles and slowly return one leg at a time to the floor. Repeat __________ times. Complete this exercise __________ times per day.  STRETCH  Low Trunk Rotation   Lie on a firm bed or floor. Keeping your legs in front of you, bend your knees so they are both pointed toward the ceiling and your feet are flat on the floor.  Extend your arms out to the side. This will stabilize your upper body by keeping your shoulders in contact with the floor.  Gently and slowly drop both knees together to one side until you  feel a gentle stretch in your low back. Hold for __________ seconds.  Tense your stomach muscles to support your low back as you bring your knees back to the starting position. Repeat the exercise to the other side. Repeat __________ times. Complete this exercise __________ times per day  EXTENSION RANGE OF MOTION AND FLEXIBILITY EXERCISES: STRETCH  Extension, Prone on Elbows  Lie on your stomach on the floor, a bed will be too soft. Place your palms about shoulder  width apart and at the height of your head.  Place your elbows under your shoulders. If this is too painful, stack pillows under your chest.  Allow your body to relax so that your hips drop lower and make contact more completely with the floor.  Hold this position for __________ seconds.  Slowly return to lying flat on the floor. Repeat __________ times. Complete this exercise __________ times per day.  RANGE OF MOTION  Extension, Prone Press Ups  Lie on your stomach on the floor, a bed will be too soft. Place your palms about shoulder width apart and at the height of your head.  Keeping your back as relaxed as possible, slowly straighten your elbows while keeping your hips on the floor. You may adjust the placement of your hands to maximize your comfort. As you gain motion, your hands will come more underneath your shoulders.  Hold this position __________ seconds.  Slowly return to lying flat on the floor. Repeat __________ times. Complete this exercise __________ times per day.  STRENGTHENING EXERCISES - Sciatica  These exercises may help you when beginning to rehabilitate your injury. These exercises should be done near your "sweet spot." This is the neutral, low-back arch, somewhere between fully rounded and fully arched, that is your least painful position. When performed in this safe range of motion, these exercises can be used for people who have either a flexion or extension based injury. These exercises may resolve your symptoms with or without further involvement from your physician, physical therapist or athletic trainer. While completing these exercises, remember:   Muscles can gain both the endurance and the strength needed for everyday activities through controlled exercises.  Complete these exercises as instructed by your physician, physical therapist or athletic trainer. Progress with the resistance and repetition exercises only as your caregiver advises.  You may  experience muscle soreness or fatigue, but the pain or discomfort you are trying to eliminate should never worsen during these exercises. If this pain does worsen, stop and make certain you are following the directions exactly. If the pain is still present after adjustments, discontinue the exercise until you can discuss the trouble with your clinician. STRENGTHENING Deep Abdominals, Pelvic Tilt   Lie on a firm bed or floor. Keeping your legs in front of you, bend your knees so they are both pointed toward the ceiling and your feet are flat on the floor.  Tense your lower abdominal muscles to press your low back into the floor. This motion will rotate your pelvis so that your tail bone is scooping upwards rather than pointing at your feet or into the floor.  With a gentle tension and even breathing, hold this position for __________ seconds. Repeat __________ times. Complete this exercise __________ times per day.  STRENGTHENING  Abdominals, Crunches   Lie on a firm bed or floor. Keeping your legs in front of you, bend your knees so they are both pointed toward the ceiling and your feet are flat on the floor. [employer]  your arms over your chest.  Slightly tip your chin down without bending your neck.  Tense your abdominals and slowly lift your trunk high enough to just clear your shoulder blades. Lifting higher can put excessive stress on the low back and does not further strengthen your abdominal muscles.  Control your return to the starting position. Repeat __________ times. Complete this exercise __________ times per day.  STRENGTHENING  Quadruped, Opposite UE/LE Lift  Assume a hands and knees position on a firm surface. Keep your hands under your shoulders and your knees under your hips. You may place padding under your knees for comfort.  Find your neutral spine and gently tense your abdominal muscles so that you can maintain this position. Your shoulders and hips should form a rectangle that  is parallel with the floor and is not twisted.  Keeping your trunk steady, lift your right hand no higher than your shoulder and then your left leg no higher than your hip. Make sure you are not holding your breath. Hold this position __________ seconds.  Continuing to keep your abdominal muscles tense and your back steady, slowly return to your starting position. Repeat with the opposite arm and leg. Repeat __________ times. Complete this exercise __________ times per day.  STRENGTHENING  Abdominals and Quadriceps, Straight Leg Raise   Lie on a firm bed or floor with both legs extended in front of you.  Keeping one leg in contact with the floor, bend the other knee so that your foot can rest flat on the floor.  Find your neutral spine, and tense your abdominal muscles to maintain your spinal position throughout the exercise.  Slowly lift your straight leg off the floor about 6 inches for a count of 15, making sure to not hold your breath.  Still keeping your neutral spine, slowly lower your leg all the way to the floor. Repeat this exercise with each leg __________ times. Complete this exercise __________ times per day. POSTURE AND BODY MECHANICS CONSIDERATIONS - Sciatica Keeping correct posture when sitting, standing or completing your activities will reduce the stress put on different body tissues, allowing injured tissues a chance to heal and limiting painful experiences. The following are general guidelines for improved posture. Your physician or physical therapist will provide you with any instructions specific to your needs. While reading these guidelines, remember:  The exercises prescribed by your provider will help you have the flexibility and strength to maintain correct postures.  The correct posture provides the optimal environment for your joints to work. All of your joints have less wear and tear when properly supported by a spine with good posture. This means you will  experience a healthier, less painful body.  Correct posture must be practiced with all of your activities, especially prolonged sitting and standing. Correct posture is as important when doing repetitive low-stress activities (typing) as it is when doing a single heavy-load activity (lifting). RESTING POSITIONS Consider which positions are most painful for you when choosing a resting position. If you have pain with flexion-based activities (sitting, bending, stooping, squatting), choose a position that allows you to rest in a less flexed posture. You would want to avoid curling into a fetal position on your side. If your pain worsens with extension-based activities (prolonged standing, working overhead), avoid resting in an extended position such as sleeping on your stomach. Most people will find more comfort when they rest with their spine in a more neutral position, neither too rounded nor too arched. Lying  on a non-sagging bed on your side with a pillow between your knees, or on your back with a pillow under your knees will often provide some relief. Keep in mind, being in any one position for a prolonged period of time, no matter how correct your posture, can still lead to stiffness. PROPER SITTING POSTURE In order to minimize stress and discomfort on your spine, you must sit with correct posture Sitting with good posture should be effortless for a healthy body. Returning to good posture is a gradual process. Many people can work toward this most comfortably by using various supports until they have the flexibility and strength to maintain this posture on their own. When sitting with proper posture, your ears will fall over your shoulders and your shoulders will fall over your hips. You should use the back of the chair to support your upper back. Your low back will be in a neutral position, just slightly arched. You may place a small pillow or folded towel at the base of your low back for support.  When  working at a desk, create an environment that supports good, upright posture. Without extra support, muscles fatigue and lead to excessive strain on joints and other tissues. Keep these recommendations in mind: CHAIR:   A chair should be able to slide under your desk when your back makes contact with the back of the chair. This allows you to work closely.  The chair's height should allow your eyes to be level with the upper part of your monitor and your hands to be slightly lower than your elbows. BODY POSITION  Your feet should make contact with the floor. If this is not possible, use a foot rest.  Keep your ears over your shoulders. This will reduce stress on your neck and low back. INCORRECT SITTING POSTURES   If you are feeling tired and unable to assume a healthy sitting posture, do not slouch or slump. This puts excessive strain on your back tissues, causing more damage and pain. Healthier options include:  Using more support, like a lumbar pillow.  Switching tasks to something that requires you to be upright or walking.  Talking a brief walk.  Lying down to rest in a neutral-spine position. PROLONGED STANDING WHILE SLIGHTLY LEANING FORWARD  When completing a task that requires you to lean forward while standing in one place for a long time, place either foot up on a stationary 2-4 inch high object to help maintain the best posture. When both feet are on the ground, the low back tends to lose its slight inward curve. If this curve flattens (or becomes too large), then the back and your other joints will experience too much stress, fatigue more quickly and can cause pain.  CORRECT STANDING POSTURES Proper standing posture should be assumed with all daily activities, even if they only take a few moments, like when brushing your teeth. As in sitting, your ears should fall over your shoulders and your shoulders should fall over your hips. You should keep a slight tension in your abdominal  muscles to brace your spine. Your tailbone should point down to the ground, not behind your body, resulting in an over-extended swayback posture.  INCORRECT STANDING POSTURES  Common incorrect standing postures include a forward head, locked knees and/or an excessive swayback. WALKING Walk with an upright posture. Your ears, shoulders and hips should all line-up. PROLONGED ACTIVITY IN A FLEXED POSITION When completing a task that requires you to bend forward at your  waist or lean over a low surface, try to find a way to stabilize 3 of 4 of your limbs. You can place a hand or elbow on your thigh or rest a knee on the surface you are reaching across. This will provide you more stability so that your muscles do not fatigue as quickly. By keeping your knees relaxed, or slightly bent, you will also reduce stress across your low back. CORRECT LIFTING TECHNIQUES DO :   Assume a wide stance. This will provide you more stability and the opportunity to get as close as possible to the object which you are lifting.  Tense your abdominals to brace your spine; then bend at the knees and hips. Keeping your back locked in a neutral-spine position, lift using your leg muscles. Lift with your legs, keeping your back straight.  Test the weight of unknown objects before attempting to lift them.  Try to keep your elbows locked down at your sides in order get the best strength from your shoulders when carrying an object.  Always ask for help when lifting heavy or awkward objects. INCORRECT LIFTING TECHNIQUES DO NOT:   Lock your knees when lifting, even if it is a small object.  Bend and twist. Pivot at your feet or move your feet when needing to change directions.  Assume that you cannot safely pick up a paperclip without proper posture. Document Released: 04/14/2005 Document Revised: 07/07/2011 Document Reviewed: 07/27/2008 [facility] Patient Information 2013 [place].

## 2012-08-03 DIAGNOSIS — IMO0002 Reserved for concepts with insufficient information to code with codable children: Secondary | ICD-10-CM | POA: Diagnosis not present

## 2012-08-03 DIAGNOSIS — M4120 Other idiopathic scoliosis, site unspecified: Secondary | ICD-10-CM | POA: Diagnosis not present

## 2012-08-10 DIAGNOSIS — M47817 Spondylosis without myelopathy or radiculopathy, lumbosacral region: Secondary | ICD-10-CM | POA: Diagnosis not present

## 2012-08-16 DIAGNOSIS — M47817 Spondylosis without myelopathy or radiculopathy, lumbosacral region: Secondary | ICD-10-CM | POA: Diagnosis not present

## 2012-08-16 DIAGNOSIS — IMO0002 Reserved for concepts with insufficient information to code with codable children: Secondary | ICD-10-CM | POA: Diagnosis not present

## 2012-08-18 DIAGNOSIS — M47817 Spondylosis without myelopathy or radiculopathy, lumbosacral region: Secondary | ICD-10-CM | POA: Diagnosis not present

## 2012-08-18 DIAGNOSIS — IMO0002 Reserved for concepts with insufficient information to code with codable children: Secondary | ICD-10-CM | POA: Diagnosis not present

## 2012-09-02 DIAGNOSIS — M47817 Spondylosis without myelopathy or radiculopathy, lumbosacral region: Secondary | ICD-10-CM | POA: Diagnosis not present

## 2012-09-02 DIAGNOSIS — IMO0002 Reserved for concepts with insufficient information to code with codable children: Secondary | ICD-10-CM | POA: Diagnosis not present

## 2012-09-17 DIAGNOSIS — M47817 Spondylosis without myelopathy or radiculopathy, lumbosacral region: Secondary | ICD-10-CM | POA: Diagnosis not present

## 2012-09-17 DIAGNOSIS — IMO0002 Reserved for concepts with insufficient information to code with codable children: Secondary | ICD-10-CM | POA: Diagnosis not present

## 2012-09-21 ENCOUNTER — Telehealth: Payer: Self-pay | Admitting: *Deleted

## 2012-09-21 DIAGNOSIS — N289 Disorder of kidney and ureter, unspecified: Secondary | ICD-10-CM | POA: Diagnosis not present

## 2012-09-21 DIAGNOSIS — I10 Essential (primary) hypertension: Secondary | ICD-10-CM

## 2012-09-21 DIAGNOSIS — E785 Hyperlipidemia, unspecified: Secondary | ICD-10-CM | POA: Diagnosis not present

## 2012-09-21 LAB — CBC WITH DIFFERENTIAL/PLATELET
Basophils Absolute: 0 10*3/uL (ref 0.0–0.1)
Basophils Relative: 1 % (ref 0–1)
Eosinophils Absolute: 0.2 10*3/uL (ref 0.0–0.7)
Eosinophils Relative: 3 % (ref 0–5)
Lymphocytes Relative: 31 % (ref 12–46)
MCH: 31.4 pg (ref 26.0–34.0)
MCHC: 33.5 g/dL (ref 30.0–36.0)
MCV: 93.8 fL (ref 78.0–100.0)
Platelets: 225 10*3/uL (ref 150–400)
RDW: 15.3 % (ref 11.5–15.5)
WBC: 5.9 10*3/uL (ref 4.0–10.5)

## 2012-09-21 LAB — RENAL FUNCTION PANEL
Albumin: 4 g/dL (ref 3.5–5.2)
BUN: 31 mg/dL — ABNORMAL HIGH (ref 6–23)
Calcium: 9.8 mg/dL (ref 8.4–10.5)
Creat: 1.17 mg/dL — ABNORMAL HIGH (ref 0.50–1.10)
Phosphorus: 4.2 mg/dL (ref 2.3–4.6)

## 2012-09-21 LAB — HEPATIC FUNCTION PANEL
Albumin: 4 g/dL (ref 3.5–5.2)
Alkaline Phosphatase: 73 U/L (ref 39–117)
Total Bilirubin: 0.5 mg/dL (ref 0.3–1.2)

## 2012-09-21 LAB — LIPID PANEL
HDL: 88 mg/dL (ref >39)
Total CHOL/HDL Ratio: 2.4 Ratio

## 2012-09-21 NOTE — Telephone Encounter (Signed)
Pt presented to the lab. Orders entered per 05/2012 office note as below:  Labs prior to visit. Lipid, renal, cbc, tsh, hepatic

## 2012-09-28 ENCOUNTER — Encounter: Payer: Self-pay | Admitting: Family Medicine

## 2012-09-28 ENCOUNTER — Ambulatory Visit (INDEPENDENT_AMBULATORY_CARE_PROVIDER_SITE_OTHER): Payer: Medicare Other | Admitting: Family Medicine

## 2012-09-28 ENCOUNTER — Telehealth: Payer: Self-pay

## 2012-09-28 VITALS — BP 140/72 | HR 78 | Temp 97.7°F | Ht 60.5 in | Wt 137.0 lb

## 2012-09-28 DIAGNOSIS — N289 Disorder of kidney and ureter, unspecified: Secondary | ICD-10-CM

## 2012-09-28 DIAGNOSIS — K219 Gastro-esophageal reflux disease without esophagitis: Secondary | ICD-10-CM

## 2012-09-28 DIAGNOSIS — E785 Hyperlipidemia, unspecified: Secondary | ICD-10-CM | POA: Diagnosis not present

## 2012-09-28 DIAGNOSIS — I10 Essential (primary) hypertension: Secondary | ICD-10-CM

## 2012-09-28 NOTE — Patient Instructions (Addendum)
Labs prior liver, renal, cbc, tsh, flp  Add co Q 10 enzyme daily Consider a krill oil cap such as MegaRed caps daily Cholesterol Cholesterol is a white, waxy, fat-like protein needed by your body in small amounts. The liver makes all the cholesterol you need. It is carried from the liver by the blood through the blood vessels. Deposits (plaque) may build up on blood vessel walls. This makes the arteries narrower and stiffer. Plaque increases the risk for heart attack and stroke. You cannot feel your cholesterol level even if it is very high. The only way to know is by a blood test to check your lipid (fats) levels. Once you know your cholesterol levels, you should keep a record of the test results. Work with your caregiver to to keep your levels in the desired range. WHAT THE RESULTS MEAN:  Total cholesterol is a rough measure of all the cholesterol in your blood.  LDL is the so-called bad cholesterol. This is the type that deposits cholesterol in the walls of the arteries. You want this level to be low.  HDL is the good cholesterol because it cleans the arteries and carries the LDL away. You want this level to be high.  Triglycerides are fat that the body can either burn for energy or store. High levels are closely linked to heart disease. DESIRED LEVELS:  Total cholesterol below 200.  LDL below 100 for people at risk, below 70 for very high risk.  HDL above 50 is good, above 60 is best.  Triglycerides below 150. HOW TO LOWER YOUR CHOLESTEROL:  Diet.  Choose fish or white meat chicken and [place], roasted or baked. Limit fatty cuts of red meat, fried foods, and processed meats, such as sausage and lunch meat.  Eat lots of fresh fruits and vegetables. Choose whole grains, beans, pasta, potatoes and cereals.  Use only small amounts of olive, corn or canola oils. Avoid butter, mayonnaise, shortening or palm kernel oils. Avoid foods with trans-fats.  Use skim/nonfat milk and  low-fat/nonfat yogurt and cheeses. Avoid whole milk, cream, ice cream, egg yolks and cheeses. Healthy desserts include angel food cake, ginger snaps, animal crackers, hard candy, popsicles, and low-fat/nonfat frozen yogurt. Avoid pastries, cakes, pies and cookies.  Exercise.  A regular program helps decrease LDL and raises HDL.  Helps with weight control.  Do things that increase your activity level like gardening, walking, or taking the stairs.  Medication.  May be prescribed by your caregiver to help lowering cholesterol and the risk for heart disease.  You may need medicine even if your levels are normal if you have several risk factors. HOME CARE INSTRUCTIONS   Follow your diet and exercise programs as suggested by your caregiver.  Take medications as directed.  Have blood work done when your caregiver feels it is necessary. MAKE SURE YOU:   Understand these instructions.  Will watch your condition.  Will get help right away if you are not doing well or get worse. Document Released: 01/07/2001 Document Revised: 07/07/2011 Document Reviewed: 06/30/2007 [facility] Patient Information 2014 [place], [place].

## 2012-09-28 NOTE — Progress Notes (Signed)
Patient ID: Judith Boone, female   DOB: [birth year], 79 y.o.   MRN: [id number] Judith Boone [id number] 22-May-1931 09/28/2012      Progress Note-Follow Up  Subjective  Chief Complaint  Chief Complaint  Patient presents with  . Follow-up    3 month    HPI  Patient is a 79 year old Caucasian female in today for routine followup. Overall she's been feeling well. She did.drop her Crestor to just twice a week due to myalgias. When she dropped to that level her myalgias disappeared. Otherwise she denies recent illness, chest pain, palpitations, shortness of breath, GI or GU concerns. Is taking all other medications as prescribed.  Past Medical History  Diagnosis Date  . Arthritis   . GERD (gastroesophageal reflux disease)     Past Surgical History  Procedure Laterality Date  . Cataract extraction    . Broken ankle repair      left  . Total knee arthroplasty      right  . Abdominal hysterectomy      Family History  Problem Relation Age of Onset  . Alcohol abuse Father   . Heart disease Father   . Hypertension Father     History   Social History  . Marital Status: Married    Spouse Name: N/A    Number of Children: N/A  . Years of Education: N/A   Occupational History  . Not on file.   Social History Main Topics  . Smoking status: Former Research scientist (life sciences)  . Smokeless tobacco: Never Used  . Alcohol Use: Yes  . Drug Use: No  . Sexually Active: Not on file   Other Topics Concern  . Not on file   Social History Narrative  . No narrative on file    Current Outpatient Prescriptions on File Prior to Visit  Medication Sig Dispense Refill  . Ascorbic Acid (VITAMIN C) 500 MG tablet Take 500 mg by mouth daily. 2 per day       . aspirin 81 MG tablet Take 81 mg by mouth 3 (three) times a week.        . calcium carbonate (OS-CAL) 600 MG TABS Take 600 mg by mouth 2 (two) times daily with a meal.        . cetirizine (ZYRTEC) 10 MG tablet Take 10 mg by mouth daily.        .  Cholecalciferol (VITAMIN D3) 1000 UNITS CAPS Take by mouth. 2 per day       . CRESTOR 5 MG tablet TAKE 1 TALET TWICE A WEEK  24 tablet  0  . estrogens, conjugated, (PREMARIN) 0.3 MG tablet Take 0.3 mg by mouth 2 (two) times a week.      . Multiple Vitamins-Minerals ([employer] SPECTRAVITE PO) Take by mouth daily.        Marland Kitchen omeprazole (PRILOSEC) 20 MG capsule       . quinapril (ACCUPRIL) 20 MG tablet TAKE 1 TABLET AT BEDTIME  90 tablet  1  . traMADol (ULTRAM) 50 MG tablet Take 50 mg by mouth at bedtime.      . triamterene-hydrochlorothiazide (MAXZIDE-25) 37.5-25 MG per tablet TAKE 1 EACH (1 TABLET TOTAL) BY MOUTH DAILY.  90 tablet  1   No current facility-administered medications on file prior to visit.    No Known Allergies  Review of Systems  Review of Systems  Constitutional: Negative for fever and malaise/fatigue.  HENT: Negative for congestion.   Eyes: Negative for discharge.  Respiratory: Negative for shortness  of breath.   Cardiovascular: Negative for chest pain, palpitations and leg swelling.  Gastrointestinal: Negative for nausea, abdominal pain and diarrhea.  Genitourinary: Negative for dysuria.  Musculoskeletal: Negative for falls.  Skin: Negative for rash.  Neurological: Negative for loss of consciousness and headaches.  Endo/Heme/Allergies: Negative for polydipsia.  Psychiatric/Behavioral: Negative for depression and suicidal ideas. The patient is not nervous/anxious and does not have insomnia.     Objective  BP 140/72  Pulse 78  Temp(Src) 97.7 F (36.5 C) (Oral)  Ht 5' 0.5" (1.537 m)  Wt 137 lb (62.143 kg)  BMI 26.31 kg/m2  SpO2 97%  Physical Exam  Physical Exam  Constitutional: She is oriented to person, place, and time and well-developed, well-nourished, and in no distress. No distress.  HENT:  Head: Normocephalic and atraumatic.  Eyes: Conjunctivae are normal.  Neck: Neck supple. No thyromegaly present.  Cardiovascular: Normal rate, regular rhythm and normal  heart sounds.   No murmur heard. Pulmonary/Chest: Effort normal and breath sounds normal. She has no wheezes.  Abdominal: She exhibits no distension and no mass.  Musculoskeletal: She exhibits no edema.  Lymphadenopathy:    She has no cervical adenopathy.  Neurological: She is alert and oriented to person, place, and time.  Skin: Skin is warm and dry. No rash noted. She is not diaphoretic.  Psychiatric: Memory, affect and judgment normal.    Lab Results  Component Value Date   TSH 1.515 09/21/2012   Lab Results  Component Value Date   WBC 5.9 09/21/2012   HGB 12.6 09/21/2012   HCT 37.6 09/21/2012   MCV 93.8 09/21/2012   PLT 225 09/21/2012   Lab Results  Component Value Date   CREATININE 1.17* 09/21/2012   BUN 31* 09/21/2012   NA 132* 09/21/2012   K 4.9 09/21/2012   CL 97 09/21/2012   CO2 26 09/21/2012   Lab Results  Component Value Date   ALT 16 09/21/2012   AST 24 09/21/2012   ALKPHOS 73 09/21/2012   BILITOT 0.5 09/21/2012   Lab Results  Component Value Date   CHOL 215* 09/21/2012   Lab Results  Component Value Date   HDL 88 09/21/2012   Lab Results  Component Value Date   LDLCALC 107* 09/21/2012   Lab Results  Component Value Date   TRIG 100 09/21/2012   Lab Results  Component Value Date   CHOLHDL 2.4 09/21/2012     Assessment & Plan  HTN (hypertension) Well controlled, no changes.   Other and unspecified hyperlipidemia Avoid trans fats, start krill oil caps. Has been taking Crestor twice a week due to myalgias with more frequent doses. Try adding co Q10 or switching to livalo  Renal insufficiency Improved slightly  GERD (gastroesophageal reflux disease) Well controlled on Omeprazole continue the same

## 2012-09-28 NOTE — Telephone Encounter (Signed)
Labs ordered and pt informed

## 2012-09-28 NOTE — Telephone Encounter (Signed)
Message copied by Varney Daily on Tue Sep 28, 2012  4:33 PM ------      Message from: Penni Homans A      Created: Tue Sep 28, 2012 10:55 AM       Please order a renal panel for her for next week and let her know ------

## 2012-10-03 NOTE — Assessment & Plan Note (Signed)
Well controlled on Omeprazole continue the same

## 2012-10-03 NOTE — Assessment & Plan Note (Signed)
Well controlled, no changes 

## 2012-10-03 NOTE — Assessment & Plan Note (Addendum)
Avoid trans fats, start krill oil caps. Has been taking Crestor twice a week due to myalgias with more frequent doses. Try adding co Q10 or switching to livalo

## 2012-10-03 NOTE — Assessment & Plan Note (Signed)
Improved slightly

## 2012-10-12 DIAGNOSIS — J309 Allergic rhinitis, unspecified: Secondary | ICD-10-CM | POA: Diagnosis not present

## 2012-10-12 DIAGNOSIS — Z6827 Body mass index (BMI) 27.0-27.9, adult: Secondary | ICD-10-CM | POA: Diagnosis not present

## 2012-10-12 DIAGNOSIS — I10 Essential (primary) hypertension: Secondary | ICD-10-CM | POA: Diagnosis not present

## 2012-10-12 DIAGNOSIS — Z1331 Encounter for screening for depression: Secondary | ICD-10-CM | POA: Diagnosis not present

## 2012-10-12 DIAGNOSIS — E785 Hyperlipidemia, unspecified: Secondary | ICD-10-CM | POA: Diagnosis not present

## 2012-10-12 DIAGNOSIS — N951 Menopausal and female climacteric states: Secondary | ICD-10-CM | POA: Diagnosis not present

## 2012-10-12 DIAGNOSIS — M5430 Sciatica, unspecified side: Secondary | ICD-10-CM | POA: Diagnosis not present

## 2012-10-12 DIAGNOSIS — K219 Gastro-esophageal reflux disease without esophagitis: Secondary | ICD-10-CM | POA: Diagnosis not present

## 2012-10-14 DIAGNOSIS — M4120 Other idiopathic scoliosis, site unspecified: Secondary | ICD-10-CM | POA: Diagnosis not present

## 2012-10-14 DIAGNOSIS — M47817 Spondylosis without myelopathy or radiculopathy, lumbosacral region: Secondary | ICD-10-CM | POA: Diagnosis not present

## 2012-10-14 DIAGNOSIS — IMO0002 Reserved for concepts with insufficient information to code with codable children: Secondary | ICD-10-CM | POA: Diagnosis not present

## 2012-11-11 DIAGNOSIS — I789 Disease of capillaries, unspecified: Secondary | ICD-10-CM | POA: Diagnosis not present

## 2012-11-11 DIAGNOSIS — L570 Actinic keratosis: Secondary | ICD-10-CM | POA: Diagnosis not present

## 2012-11-11 DIAGNOSIS — L821 Other seborrheic keratosis: Secondary | ICD-10-CM | POA: Diagnosis not present

## 2012-11-11 DIAGNOSIS — Z85828 Personal history of other malignant neoplasm of skin: Secondary | ICD-10-CM | POA: Diagnosis not present

## 2012-12-06 DIAGNOSIS — I10 Essential (primary) hypertension: Secondary | ICD-10-CM | POA: Diagnosis not present

## 2012-12-06 DIAGNOSIS — E785 Hyperlipidemia, unspecified: Secondary | ICD-10-CM | POA: Diagnosis not present

## 2012-12-14 DIAGNOSIS — I10 Essential (primary) hypertension: Secondary | ICD-10-CM | POA: Diagnosis not present

## 2012-12-14 DIAGNOSIS — Z23 Encounter for immunization: Secondary | ICD-10-CM | POA: Diagnosis not present

## 2012-12-14 DIAGNOSIS — E785 Hyperlipidemia, unspecified: Secondary | ICD-10-CM | POA: Diagnosis not present

## 2012-12-14 DIAGNOSIS — N183 Chronic kidney disease, stage 3 unspecified: Secondary | ICD-10-CM | POA: Diagnosis not present

## 2012-12-14 DIAGNOSIS — R609 Edema, unspecified: Secondary | ICD-10-CM | POA: Diagnosis not present

## 2012-12-14 DIAGNOSIS — I739 Peripheral vascular disease, unspecified: Secondary | ICD-10-CM | POA: Diagnosis not present

## 2012-12-14 DIAGNOSIS — D649 Anemia, unspecified: Secondary | ICD-10-CM | POA: Diagnosis not present

## 2012-12-14 DIAGNOSIS — G2581 Restless legs syndrome: Secondary | ICD-10-CM | POA: Diagnosis not present

## 2012-12-14 DIAGNOSIS — Z0000 Encounter for general adult medical examination without abnormal findings: Secondary | ICD-10-CM | POA: Diagnosis not present

## 2012-12-15 ENCOUNTER — Encounter (INDEPENDENT_AMBULATORY_CARE_PROVIDER_SITE_OTHER): Payer: Medicare Other | Admitting: Vascular Surgery

## 2012-12-15 DIAGNOSIS — I739 Peripheral vascular disease, unspecified: Secondary | ICD-10-CM

## 2012-12-16 DIAGNOSIS — Z1212 Encounter for screening for malignant neoplasm of rectum: Secondary | ICD-10-CM | POA: Diagnosis not present

## 2013-01-06 DIAGNOSIS — E785 Hyperlipidemia, unspecified: Secondary | ICD-10-CM | POA: Diagnosis not present

## 2013-02-16 DIAGNOSIS — L659 Nonscarring hair loss, unspecified: Secondary | ICD-10-CM | POA: Diagnosis not present

## 2013-02-16 DIAGNOSIS — G2581 Restless legs syndrome: Secondary | ICD-10-CM | POA: Diagnosis not present

## 2013-02-16 DIAGNOSIS — Z23 Encounter for immunization: Secondary | ICD-10-CM | POA: Diagnosis not present

## 2013-02-16 DIAGNOSIS — E785 Hyperlipidemia, unspecified: Secondary | ICD-10-CM | POA: Diagnosis not present

## 2013-02-16 DIAGNOSIS — N183 Chronic kidney disease, stage 3 unspecified: Secondary | ICD-10-CM | POA: Diagnosis not present

## 2013-02-16 DIAGNOSIS — M549 Dorsalgia, unspecified: Secondary | ICD-10-CM | POA: Diagnosis not present

## 2013-02-16 DIAGNOSIS — I10 Essential (primary) hypertension: Secondary | ICD-10-CM | POA: Diagnosis not present

## 2013-02-16 DIAGNOSIS — Z6828 Body mass index (BMI) 28.0-28.9, adult: Secondary | ICD-10-CM | POA: Diagnosis not present

## 2013-03-03 ENCOUNTER — Other Ambulatory Visit: Payer: Self-pay

## 2013-03-04 DIAGNOSIS — L659 Nonscarring hair loss, unspecified: Secondary | ICD-10-CM | POA: Diagnosis not present

## 2013-03-04 DIAGNOSIS — Z85828 Personal history of other malignant neoplasm of skin: Secondary | ICD-10-CM | POA: Diagnosis not present

## 2013-03-04 DIAGNOSIS — Z79899 Other long term (current) drug therapy: Secondary | ICD-10-CM | POA: Diagnosis not present

## 2013-03-04 DIAGNOSIS — L723 Sebaceous cyst: Secondary | ICD-10-CM | POA: Diagnosis not present

## 2013-03-29 ENCOUNTER — Ambulatory Visit: Payer: Medicare Other | Admitting: Family Medicine

## 2013-05-03 DIAGNOSIS — Z6828 Body mass index (BMI) 28.0-28.9, adult: Secondary | ICD-10-CM | POA: Diagnosis not present

## 2013-05-03 DIAGNOSIS — I10 Essential (primary) hypertension: Secondary | ICD-10-CM | POA: Diagnosis not present

## 2013-05-03 DIAGNOSIS — N183 Chronic kidney disease, stage 3 unspecified: Secondary | ICD-10-CM | POA: Diagnosis not present

## 2013-05-03 DIAGNOSIS — R609 Edema, unspecified: Secondary | ICD-10-CM | POA: Diagnosis not present

## 2013-05-17 DIAGNOSIS — L821 Other seborrheic keratosis: Secondary | ICD-10-CM | POA: Diagnosis not present

## 2013-05-17 DIAGNOSIS — Z85828 Personal history of other malignant neoplasm of skin: Secondary | ICD-10-CM | POA: Diagnosis not present

## 2013-05-17 DIAGNOSIS — L659 Nonscarring hair loss, unspecified: Secondary | ICD-10-CM | POA: Diagnosis not present

## 2013-07-07 DIAGNOSIS — M47817 Spondylosis without myelopathy or radiculopathy, lumbosacral region: Secondary | ICD-10-CM | POA: Diagnosis not present

## 2013-07-07 DIAGNOSIS — IMO0002 Reserved for concepts with insufficient information to code with codable children: Secondary | ICD-10-CM | POA: Diagnosis not present

## 2013-07-07 DIAGNOSIS — M4120 Other idiopathic scoliosis, site unspecified: Secondary | ICD-10-CM | POA: Diagnosis not present

## 2013-07-15 DIAGNOSIS — IMO0002 Reserved for concepts with insufficient information to code with codable children: Secondary | ICD-10-CM | POA: Diagnosis not present

## 2013-07-15 DIAGNOSIS — M47817 Spondylosis without myelopathy or radiculopathy, lumbosacral region: Secondary | ICD-10-CM | POA: Diagnosis not present

## 2013-07-19 DIAGNOSIS — M47817 Spondylosis without myelopathy or radiculopathy, lumbosacral region: Secondary | ICD-10-CM | POA: Diagnosis not present

## 2013-07-19 DIAGNOSIS — M25559 Pain in unspecified hip: Secondary | ICD-10-CM | POA: Diagnosis not present

## 2013-07-19 DIAGNOSIS — M545 Low back pain, unspecified: Secondary | ICD-10-CM | POA: Diagnosis not present

## 2013-07-21 DIAGNOSIS — M47817 Spondylosis without myelopathy or radiculopathy, lumbosacral region: Secondary | ICD-10-CM | POA: Diagnosis not present

## 2013-07-26 DIAGNOSIS — M545 Low back pain, unspecified: Secondary | ICD-10-CM | POA: Diagnosis not present

## 2013-07-26 DIAGNOSIS — M47817 Spondylosis without myelopathy or radiculopathy, lumbosacral region: Secondary | ICD-10-CM | POA: Diagnosis not present

## 2013-07-26 DIAGNOSIS — M25559 Pain in unspecified hip: Secondary | ICD-10-CM | POA: Diagnosis not present

## 2013-07-28 DIAGNOSIS — M47817 Spondylosis without myelopathy or radiculopathy, lumbosacral region: Secondary | ICD-10-CM | POA: Diagnosis not present

## 2013-07-28 DIAGNOSIS — M25559 Pain in unspecified hip: Secondary | ICD-10-CM | POA: Diagnosis not present

## 2013-07-28 DIAGNOSIS — M545 Low back pain, unspecified: Secondary | ICD-10-CM | POA: Diagnosis not present

## 2013-08-03 DIAGNOSIS — M545 Low back pain, unspecified: Secondary | ICD-10-CM | POA: Diagnosis not present

## 2013-08-03 DIAGNOSIS — M25559 Pain in unspecified hip: Secondary | ICD-10-CM | POA: Diagnosis not present

## 2013-08-03 DIAGNOSIS — M47817 Spondylosis without myelopathy or radiculopathy, lumbosacral region: Secondary | ICD-10-CM | POA: Diagnosis not present

## 2013-08-04 DIAGNOSIS — M47817 Spondylosis without myelopathy or radiculopathy, lumbosacral region: Secondary | ICD-10-CM | POA: Diagnosis not present

## 2013-08-04 DIAGNOSIS — M545 Low back pain, unspecified: Secondary | ICD-10-CM | POA: Diagnosis not present

## 2013-08-04 DIAGNOSIS — M76899 Other specified enthesopathies of unspecified lower limb, excluding foot: Secondary | ICD-10-CM | POA: Diagnosis not present

## 2013-08-11 DIAGNOSIS — M47817 Spondylosis without myelopathy or radiculopathy, lumbosacral region: Secondary | ICD-10-CM | POA: Diagnosis not present

## 2013-08-11 DIAGNOSIS — M1610 Unilateral primary osteoarthritis, unspecified hip: Secondary | ICD-10-CM | POA: Diagnosis not present

## 2013-08-19 DIAGNOSIS — M5126 Other intervertebral disc displacement, lumbar region: Secondary | ICD-10-CM | POA: Diagnosis not present

## 2013-08-19 DIAGNOSIS — IMO0002 Reserved for concepts with insufficient information to code with codable children: Secondary | ICD-10-CM | POA: Diagnosis not present

## 2013-08-19 DIAGNOSIS — M545 Low back pain, unspecified: Secondary | ICD-10-CM | POA: Diagnosis not present

## 2013-08-22 DIAGNOSIS — Z6828 Body mass index (BMI) 28.0-28.9, adult: Secondary | ICD-10-CM | POA: Diagnosis not present

## 2013-08-22 DIAGNOSIS — M549 Dorsalgia, unspecified: Secondary | ICD-10-CM | POA: Diagnosis not present

## 2013-08-22 DIAGNOSIS — I10 Essential (primary) hypertension: Secondary | ICD-10-CM | POA: Diagnosis not present

## 2013-09-01 DIAGNOSIS — IMO0002 Reserved for concepts with insufficient information to code with codable children: Secondary | ICD-10-CM | POA: Diagnosis not present

## 2013-09-01 DIAGNOSIS — M545 Low back pain, unspecified: Secondary | ICD-10-CM | POA: Diagnosis not present

## 2013-09-01 DIAGNOSIS — M5126 Other intervertebral disc displacement, lumbar region: Secondary | ICD-10-CM | POA: Diagnosis not present

## 2013-09-09 DIAGNOSIS — M5126 Other intervertebral disc displacement, lumbar region: Secondary | ICD-10-CM | POA: Diagnosis not present

## 2013-09-09 DIAGNOSIS — IMO0002 Reserved for concepts with insufficient information to code with codable children: Secondary | ICD-10-CM | POA: Diagnosis not present

## 2013-09-09 DIAGNOSIS — M545 Low back pain, unspecified: Secondary | ICD-10-CM | POA: Diagnosis not present

## 2013-09-23 DIAGNOSIS — H01009 Unspecified blepharitis unspecified eye, unspecified eyelid: Secondary | ICD-10-CM | POA: Diagnosis not present

## 2013-09-23 DIAGNOSIS — H35369 Drusen (degenerative) of macula, unspecified eye: Secondary | ICD-10-CM | POA: Diagnosis not present

## 2013-09-23 DIAGNOSIS — H43819 Vitreous degeneration, unspecified eye: Secondary | ICD-10-CM | POA: Diagnosis not present

## 2013-09-23 DIAGNOSIS — Z961 Presence of intraocular lens: Secondary | ICD-10-CM | POA: Diagnosis not present

## 2013-09-26 DIAGNOSIS — M545 Low back pain, unspecified: Secondary | ICD-10-CM | POA: Diagnosis not present

## 2013-09-26 DIAGNOSIS — IMO0002 Reserved for concepts with insufficient information to code with codable children: Secondary | ICD-10-CM | POA: Diagnosis not present

## 2013-09-26 DIAGNOSIS — M5126 Other intervertebral disc displacement, lumbar region: Secondary | ICD-10-CM | POA: Diagnosis not present

## 2013-09-30 DIAGNOSIS — M5126 Other intervertebral disc displacement, lumbar region: Secondary | ICD-10-CM | POA: Diagnosis not present

## 2013-09-30 DIAGNOSIS — M545 Low back pain, unspecified: Secondary | ICD-10-CM | POA: Diagnosis not present

## 2013-09-30 DIAGNOSIS — IMO0002 Reserved for concepts with insufficient information to code with codable children: Secondary | ICD-10-CM | POA: Diagnosis not present

## 2013-10-20 DIAGNOSIS — M545 Low back pain, unspecified: Secondary | ICD-10-CM | POA: Diagnosis not present

## 2013-10-20 DIAGNOSIS — M48061 Spinal stenosis, lumbar region without neurogenic claudication: Secondary | ICD-10-CM | POA: Diagnosis not present

## 2013-10-20 DIAGNOSIS — IMO0002 Reserved for concepts with insufficient information to code with codable children: Secondary | ICD-10-CM | POA: Diagnosis not present

## 2013-10-20 DIAGNOSIS — M5126 Other intervertebral disc displacement, lumbar region: Secondary | ICD-10-CM | POA: Diagnosis not present

## 2013-11-17 DIAGNOSIS — M48061 Spinal stenosis, lumbar region without neurogenic claudication: Secondary | ICD-10-CM | POA: Diagnosis not present

## 2013-11-17 DIAGNOSIS — M5126 Other intervertebral disc displacement, lumbar region: Secondary | ICD-10-CM | POA: Diagnosis not present

## 2013-11-17 DIAGNOSIS — IMO0002 Reserved for concepts with insufficient information to code with codable children: Secondary | ICD-10-CM | POA: Diagnosis not present

## 2013-12-09 DIAGNOSIS — D649 Anemia, unspecified: Secondary | ICD-10-CM | POA: Diagnosis not present

## 2013-12-09 DIAGNOSIS — I10 Essential (primary) hypertension: Secondary | ICD-10-CM | POA: Diagnosis not present

## 2013-12-09 DIAGNOSIS — E785 Hyperlipidemia, unspecified: Secondary | ICD-10-CM | POA: Diagnosis not present

## 2013-12-16 DIAGNOSIS — E785 Hyperlipidemia, unspecified: Secondary | ICD-10-CM | POA: Diagnosis not present

## 2013-12-16 DIAGNOSIS — N183 Chronic kidney disease, stage 3 unspecified: Secondary | ICD-10-CM | POA: Diagnosis not present

## 2013-12-16 DIAGNOSIS — Z1331 Encounter for screening for depression: Secondary | ICD-10-CM | POA: Diagnosis not present

## 2013-12-16 DIAGNOSIS — I10 Essential (primary) hypertension: Secondary | ICD-10-CM | POA: Diagnosis not present

## 2013-12-16 DIAGNOSIS — D649 Anemia, unspecified: Secondary | ICD-10-CM | POA: Diagnosis not present

## 2013-12-16 DIAGNOSIS — Z0000 Encounter for general adult medical examination without abnormal findings: Secondary | ICD-10-CM | POA: Diagnosis not present

## 2013-12-16 DIAGNOSIS — K219 Gastro-esophageal reflux disease without esophagitis: Secondary | ICD-10-CM | POA: Diagnosis not present

## 2013-12-16 DIAGNOSIS — R609 Edema, unspecified: Secondary | ICD-10-CM | POA: Diagnosis not present

## 2013-12-16 DIAGNOSIS — R82998 Other abnormal findings in urine: Secondary | ICD-10-CM | POA: Diagnosis not present

## 2013-12-19 DIAGNOSIS — Z1212 Encounter for screening for malignant neoplasm of rectum: Secondary | ICD-10-CM | POA: Diagnosis not present

## 2013-12-20 DIAGNOSIS — L738 Other specified follicular disorders: Secondary | ICD-10-CM | POA: Diagnosis not present

## 2013-12-20 DIAGNOSIS — C44519 Basal cell carcinoma of skin of other part of trunk: Secondary | ICD-10-CM | POA: Diagnosis not present

## 2013-12-20 DIAGNOSIS — L570 Actinic keratosis: Secondary | ICD-10-CM | POA: Diagnosis not present

## 2013-12-20 DIAGNOSIS — L819 Disorder of pigmentation, unspecified: Secondary | ICD-10-CM | POA: Diagnosis not present

## 2013-12-20 DIAGNOSIS — L739 Follicular disorder, unspecified: Secondary | ICD-10-CM | POA: Diagnosis not present

## 2013-12-20 DIAGNOSIS — D485 Neoplasm of uncertain behavior of skin: Secondary | ICD-10-CM | POA: Diagnosis not present

## 2013-12-20 DIAGNOSIS — Z85828 Personal history of other malignant neoplasm of skin: Secondary | ICD-10-CM | POA: Diagnosis not present

## 2013-12-20 DIAGNOSIS — I789 Disease of capillaries, unspecified: Secondary | ICD-10-CM | POA: Diagnosis not present

## 2014-01-06 ENCOUNTER — Ambulatory Visit (HOSPITAL_COMMUNITY)
Admission: RE | Admit: 2014-01-06 | Discharge: 2014-01-06 | Disposition: A | Discharge status: Home / Self Care | Payer: Medicare Other | Source: Ambulatory Visit | Attending: Vascular Surgery | Admitting: Vascular Surgery

## 2014-01-06 ENCOUNTER — Other Ambulatory Visit (HOSPITAL_COMMUNITY): Payer: Self-pay | Admitting: Internal Medicine

## 2014-01-06 DIAGNOSIS — E785 Hyperlipidemia, unspecified: Secondary | ICD-10-CM | POA: Diagnosis not present

## 2014-01-06 DIAGNOSIS — I739 Peripheral vascular disease, unspecified: Secondary | ICD-10-CM

## 2014-01-06 DIAGNOSIS — Z87891 Personal history of nicotine dependence: Secondary | ICD-10-CM | POA: Insufficient documentation

## 2014-01-06 DIAGNOSIS — I10 Essential (primary) hypertension: Secondary | ICD-10-CM | POA: Diagnosis not present

## 2014-01-06 DIAGNOSIS — M79609 Pain in unspecified limb: Secondary | ICD-10-CM | POA: Diagnosis not present

## 2014-01-16 DIAGNOSIS — IMO0002 Reserved for concepts with insufficient information to code with codable children: Secondary | ICD-10-CM | POA: Diagnosis not present

## 2014-01-16 DIAGNOSIS — M4120 Other idiopathic scoliosis, site unspecified: Secondary | ICD-10-CM | POA: Diagnosis not present

## 2014-01-16 DIAGNOSIS — M48061 Spinal stenosis, lumbar region without neurogenic claudication: Secondary | ICD-10-CM | POA: Diagnosis not present

## 2014-01-16 DIAGNOSIS — M545 Low back pain, unspecified: Secondary | ICD-10-CM | POA: Diagnosis not present

## 2014-02-06 DIAGNOSIS — M5418 Radiculopathy, sacral and sacrococcygeal region: Secondary | ICD-10-CM | POA: Diagnosis not present

## 2014-02-09 DIAGNOSIS — Z23 Encounter for immunization: Secondary | ICD-10-CM | POA: Diagnosis not present

## 2014-02-22 DIAGNOSIS — Z6827 Body mass index (BMI) 27.0-27.9, adult: Secondary | ICD-10-CM | POA: Diagnosis not present

## 2014-02-22 DIAGNOSIS — M5416 Radiculopathy, lumbar region: Secondary | ICD-10-CM | POA: Diagnosis not present

## 2014-02-22 DIAGNOSIS — M545 Low back pain: Secondary | ICD-10-CM | POA: Diagnosis not present

## 2014-02-22 DIAGNOSIS — M5126 Other intervertebral disc displacement, lumbar region: Secondary | ICD-10-CM | POA: Diagnosis not present

## 2014-02-22 DIAGNOSIS — I10 Essential (primary) hypertension: Secondary | ICD-10-CM | POA: Diagnosis not present

## 2014-02-22 DIAGNOSIS — M4806 Spinal stenosis, lumbar region: Secondary | ICD-10-CM | POA: Diagnosis not present

## 2014-03-06 DIAGNOSIS — M5418 Radiculopathy, sacral and sacrococcygeal region: Secondary | ICD-10-CM | POA: Diagnosis not present

## 2014-04-12 DIAGNOSIS — M5416 Radiculopathy, lumbar region: Secondary | ICD-10-CM | POA: Diagnosis not present

## 2014-04-12 DIAGNOSIS — M4806 Spinal stenosis, lumbar region: Secondary | ICD-10-CM | POA: Diagnosis not present

## 2014-04-17 DIAGNOSIS — Z6828 Body mass index (BMI) 28.0-28.9, adult: Secondary | ICD-10-CM | POA: Diagnosis not present

## 2014-04-17 DIAGNOSIS — F419 Anxiety disorder, unspecified: Secondary | ICD-10-CM | POA: Diagnosis not present

## 2014-04-17 DIAGNOSIS — R002 Palpitations: Secondary | ICD-10-CM | POA: Diagnosis not present

## 2014-04-17 DIAGNOSIS — R0602 Shortness of breath: Secondary | ICD-10-CM | POA: Diagnosis not present

## 2014-06-23 DIAGNOSIS — I788 Other diseases of capillaries: Secondary | ICD-10-CM | POA: Diagnosis not present

## 2014-06-23 DIAGNOSIS — L720 Epidermal cyst: Secondary | ICD-10-CM | POA: Diagnosis not present

## 2014-06-23 DIAGNOSIS — D485 Neoplasm of uncertain behavior of skin: Secondary | ICD-10-CM | POA: Diagnosis not present

## 2014-06-23 DIAGNOSIS — Z85828 Personal history of other malignant neoplasm of skin: Secondary | ICD-10-CM | POA: Diagnosis not present

## 2014-06-23 DIAGNOSIS — L812 Freckles: Secondary | ICD-10-CM | POA: Diagnosis not present

## 2014-07-10 DIAGNOSIS — M5416 Radiculopathy, lumbar region: Secondary | ICD-10-CM | POA: Diagnosis not present

## 2014-07-28 DIAGNOSIS — L03116 Cellulitis of left lower limb: Secondary | ICD-10-CM

## 2014-07-28 HISTORY — DX: Cellulitis of left lower limb: L03.116

## 2014-08-02 DIAGNOSIS — M5418 Radiculopathy, sacral and sacrococcygeal region: Secondary | ICD-10-CM | POA: Diagnosis not present

## 2014-08-02 DIAGNOSIS — M5126 Other intervertebral disc displacement, lumbar region: Secondary | ICD-10-CM | POA: Diagnosis not present

## 2014-08-02 DIAGNOSIS — M4806 Spinal stenosis, lumbar region: Secondary | ICD-10-CM | POA: Diagnosis not present

## 2014-08-02 DIAGNOSIS — M5416 Radiculopathy, lumbar region: Secondary | ICD-10-CM | POA: Diagnosis not present

## 2014-08-02 DIAGNOSIS — M545 Low back pain: Secondary | ICD-10-CM | POA: Diagnosis not present

## 2014-08-02 DIAGNOSIS — Z6828 Body mass index (BMI) 28.0-28.9, adult: Secondary | ICD-10-CM | POA: Diagnosis not present

## 2014-08-02 DIAGNOSIS — I10 Essential (primary) hypertension: Secondary | ICD-10-CM | POA: Diagnosis not present

## 2014-08-16 ENCOUNTER — Inpatient Hospital Stay (HOSPITAL_BASED_OUTPATIENT_CLINIC_OR_DEPARTMENT_OTHER)
Admission: EM | Admit: 2014-08-16 | Discharge: 2014-08-20 | DRG: 872 | Disposition: A | Discharge status: Home / Self Care | Payer: Medicare Other | Attending: Internal Medicine | Admitting: Internal Medicine

## 2014-08-16 ENCOUNTER — Encounter (HOSPITAL_BASED_OUTPATIENT_CLINIC_OR_DEPARTMENT_OTHER): Payer: Self-pay

## 2014-08-16 ENCOUNTER — Emergency Department (HOSPITAL_BASED_OUTPATIENT_CLINIC_OR_DEPARTMENT_OTHER): Payer: Medicare Other

## 2014-08-16 DIAGNOSIS — N183 Chronic kidney disease, stage 3 unspecified: Secondary | ICD-10-CM | POA: Diagnosis present

## 2014-08-16 DIAGNOSIS — Z8249 Family history of ischemic heart disease and other diseases of the circulatory system: Secondary | ICD-10-CM

## 2014-08-16 DIAGNOSIS — M1990 Unspecified osteoarthritis, unspecified site: Secondary | ICD-10-CM | POA: Diagnosis present

## 2014-08-16 DIAGNOSIS — I129 Hypertensive chronic kidney disease with stage 1 through stage 4 chronic kidney disease, or unspecified chronic kidney disease: Secondary | ICD-10-CM | POA: Diagnosis present

## 2014-08-16 DIAGNOSIS — K449 Diaphragmatic hernia without obstruction or gangrene: Secondary | ICD-10-CM | POA: Diagnosis not present

## 2014-08-16 DIAGNOSIS — Z96653 Presence of artificial knee joint, bilateral: Secondary | ICD-10-CM | POA: Diagnosis present

## 2014-08-16 DIAGNOSIS — A419 Sepsis, unspecified organism: Principal | ICD-10-CM | POA: Diagnosis present

## 2014-08-16 DIAGNOSIS — K219 Gastro-esophageal reflux disease without esophagitis: Secondary | ICD-10-CM | POA: Diagnosis present

## 2014-08-16 DIAGNOSIS — Z7982 Long term (current) use of aspirin: Secondary | ICD-10-CM | POA: Diagnosis not present

## 2014-08-16 DIAGNOSIS — Z811 Family history of alcohol abuse and dependence: Secondary | ICD-10-CM | POA: Diagnosis not present

## 2014-08-16 DIAGNOSIS — Z87891 Personal history of nicotine dependence: Secondary | ICD-10-CM | POA: Diagnosis not present

## 2014-08-16 DIAGNOSIS — B9620 Unspecified Escherichia coli [E. coli] as the cause of diseases classified elsewhere: Secondary | ICD-10-CM | POA: Diagnosis present

## 2014-08-16 DIAGNOSIS — Z79899 Other long term (current) drug therapy: Secondary | ICD-10-CM

## 2014-08-16 DIAGNOSIS — M79609 Pain in unspecified limb: Secondary | ICD-10-CM | POA: Diagnosis not present

## 2014-08-16 DIAGNOSIS — L03116 Cellulitis of left lower limb: Secondary | ICD-10-CM | POA: Diagnosis present

## 2014-08-16 DIAGNOSIS — R000 Tachycardia, unspecified: Secondary | ICD-10-CM | POA: Diagnosis not present

## 2014-08-16 DIAGNOSIS — S81012A Laceration without foreign body, left knee, initial encounter: Secondary | ICD-10-CM | POA: Diagnosis not present

## 2014-08-16 HISTORY — DX: Essential (primary) hypertension: I10

## 2014-08-16 LAB — CBC WITH DIFFERENTIAL/PLATELET
BASOS ABS: 0 10*3/uL (ref 0.0–0.1)
Basophils Relative: 0 % (ref 0–1)
Eosinophils Absolute: 0 10*3/uL (ref 0.0–0.7)
Eosinophils Relative: 0 % (ref 0–5)
HCT: 39 % (ref 36.0–46.0)
Hemoglobin: 13.1 g/dL (ref 12.0–15.0)
LYMPHS ABS: 0.8 10*3/uL (ref 0.7–4.0)
LYMPHS PCT: 6 % — AB (ref 12–46)
MCH: 32.7 pg (ref 26.0–34.0)
MCHC: 33.6 g/dL (ref 30.0–36.0)
MCV: 97.3 fL (ref 78.0–100.0)
MONO ABS: 1.3 10*3/uL — AB (ref 0.1–1.0)
Monocytes Relative: 10 % (ref 3–12)
NEUTROS ABS: 10.6 10*3/uL — AB (ref 1.7–7.7)
Neutrophils Relative %: 84 % — ABNORMAL HIGH (ref 43–77)
Platelets: 190 10*3/uL (ref 150–400)
RBC: 4.01 MIL/uL (ref 3.87–5.11)
RDW: 14 % (ref 11.5–15.5)
WBC: 12.6 10*3/uL — AB (ref 4.0–10.5)

## 2014-08-16 LAB — COMPREHENSIVE METABOLIC PANEL
ALT: 19 U/L (ref 0–35)
AST: 28 U/L (ref 0–37)
Albumin: 4.2 g/dL (ref 3.5–5.2)
Alkaline Phosphatase: 72 U/L (ref 39–117)
Anion gap: 9 (ref 5–15)
BILIRUBIN TOTAL: 0.6 mg/dL (ref 0.3–1.2)
BUN: 28 mg/dL — AB (ref 6–23)
CHLORIDE: 101 mmol/L (ref 96–112)
CO2: 28 mmol/L (ref 19–32)
CREATININE: 1.59 mg/dL — AB (ref 0.50–1.10)
Calcium: 9 mg/dL (ref 8.4–10.5)
GFR, EST AFRICAN AMERICAN: 34 mL/min — AB (ref >90)
GFR, EST NON AFRICAN AMERICAN: 30 mL/min — AB (ref >90)
GLUCOSE: 120 mg/dL — AB (ref 70–99)
Potassium: 4.2 mmol/L (ref 3.5–5.1)
Sodium: 138 mmol/L (ref 135–145)
Total Protein: 7.1 g/dL (ref 6.0–8.3)

## 2014-08-16 LAB — TROPONIN I: Troponin I: 0.03 ng/mL (ref <0.031)

## 2014-08-16 LAB — I-STAT CG4 LACTIC ACID, ED: LACTIC ACID, VENOUS: 1.56 mmol/L (ref 0.5–2.0)

## 2014-08-16 MED ORDER — SODIUM CHLORIDE 0.9 % IV BOLUS (SEPSIS): 1000.0000 mL | Freq: Once | INTRAVENOUS | Status: DC

## 2014-08-16 MED ORDER — ACETAMINOPHEN 325 MG PO TABS
650.0000 mg | ORAL_TABLET | Freq: Once | ORAL | Status: AC
  Administered 2014-08-16: 650 mg via ORAL
  Filled 2014-08-16: qty 2

## 2014-08-16 MED ORDER — SODIUM CHLORIDE 0.9 % IV BOLUS (SEPSIS)
1000.0000 mL | Freq: Once | INTRAVENOUS | Status: AC
  Administered 2014-08-16: 1000 mL via INTRAVENOUS

## 2014-08-16 MED ORDER — VANCOMYCIN HCL 500 MG IV SOLR: 500.0000 mg | INTRAVENOUS | Status: DC

## 2014-08-16 MED ORDER — SODIUM CHLORIDE 0.9 % IV SOLN
INTRAVENOUS | Status: DC
  Administered 2014-08-17: 01:00:00 via INTRAVENOUS

## 2014-08-16 MED ORDER — PIPERACILLIN-TAZOBACTAM IN DEX 2-0.25 GM/50ML IV SOLN
2.2500 g | Freq: Three times a day (TID) | INTRAVENOUS | Status: DC
  Filled 2014-08-16 (×2): qty 50

## 2014-08-16 MED ORDER — PIPERACILLIN-TAZOBACTAM 3.375 G IVPB 30 MIN
3.3750 g | Freq: Once | INTRAVENOUS | Status: AC
  Administered 2014-08-16: 3.375 g via INTRAVENOUS
  Filled 2014-08-16 (×2): qty 50

## 2014-08-16 MED ORDER — VANCOMYCIN HCL IN DEXTROSE 1-5 GM/200ML-% IV SOLN
1000.0000 mg | Freq: Once | INTRAVENOUS | Status: AC
  Administered 2014-08-16: 1000 mg via INTRAVENOUS
  Filled 2014-08-16: qty 200

## 2014-08-16 NOTE — ED Provider Notes (Signed)
CSN: [id number]     Arrival date & time 08/16/14  1839 History  This chart was scribed for Quintella Reichert, MD by Irene Pap, ED Scribe. This patient was seen in room MH09/MH09 and patient care was started at 7:05 PM.     Chief Complaint  Patient presents with  . Leg Pain   Patient is a 81 y.o. female presenting with leg pain. The history is provided by the patient. No language interpreter was used.  Leg Pain Location:  Leg Time since incident:  1 week Leg location:  L lower leg Pain details:    Quality:  Sharp   Severity:  Moderate   Onset quality:  Sudden   Duration:  1 day   Timing:  Constant   Progression:  Worsening Chronicity:  New Foreign body present:  Metal Associated symptoms: fever and swelling     HPI Comments: Judith Boone is a 81 y.o. female with a history HTN and high cholesterol who presents to the Emergency Department complaining of lower left leg pain onset one week ago. She states that she cut her leg last week and it bled a lot but states that today it got red and puffy. She reports an associated fever and chills. Patient denies nausea, vomiting, dysuria, or abdominal pain. She denies allergies to oral meds and denies taking any blood thinners. Patient denies history of MRSA or skin infections.   Past Medical History  Diagnosis Date  . Arthritis   . GERD (gastroesophageal reflux disease)   . Hypertension    Past Surgical History  Procedure Laterality Date  . Cataract extraction    . Broken ankle repair      left  . Total knee arthroplasty      right  . Abdominal hysterectomy     Family History  Problem Relation Age of Onset  . Alcohol abuse Father   . Heart disease Father   . Hypertension Father    History  Substance Use Topics  . Smoking status: Former Research scientist (life sciences)  . Smokeless tobacco: Never Used  . Alcohol Use: Yes   OB History    No data available     Review of Systems  Constitutional: Positive for fever and chills.  Cardiovascular:  Positive for leg swelling.  Gastrointestinal: Negative for nausea, vomiting and abdominal pain.  Skin: Positive for wound.  All other systems reviewed and are negative.  Allergies  Bacitracin; Neosporin; and Polysporin  Home Medications   Prior to Admission medications   Medication Sig Start Date End Date Taking? Authorizing Provider  HYDRALAZINE HCL PO Take by mouth.   Yes Historical Provider, MD  loratadine (CLARITIN) 10 MG tablet Take 10 mg by mouth daily.   Yes Historical Provider, MD  Ascorbic Acid (VITAMIN C) 500 MG tablet Take 500 mg by mouth daily. 2 per day     Historical Provider, MD  aspirin 81 MG tablet Take 81 mg by mouth 3 (three) times a week.      Historical Provider, MD  calcium carbonate (OS-CAL) 600 MG TABS Take 600 mg by mouth 2 (two) times daily with a meal.      Historical Provider, MD  cetirizine (ZYRTEC) 10 MG tablet Take 10 mg by mouth daily.      Historical Provider, MD  Cholecalciferol (VITAMIN D3) 1000 UNITS CAPS Take by mouth. 2 per day     Historical Provider, MD  CRESTOR 5 MG tablet TAKE 1 TALET TWICE A WEEK 07/23/12   Erline Levine A  Charlett Blake, MD  estrogens, conjugated, (PREMARIN) 0.3 MG tablet Take 0.3 mg by mouth 2 (two) times a week. 05/12/11   Burnice Logan, MD  Multiple Vitamins-Minerals ([employer] SPECTRAVITE PO) Take by mouth daily.      Historical Provider, MD  omeprazole (PRILOSEC) 20 MG capsule  05/22/12   Historical Provider, MD  quinapril (ACCUPRIL) 20 MG tablet TAKE 1 TABLET AT BEDTIME 05/10/12   Burnice Logan, MD  traMADol (ULTRAM) 50 MG tablet Take 50 mg by mouth at bedtime.    Historical Provider, MD  triamterene-hydrochlorothiazide (MAXZIDE-25) 37.5-25 MG per tablet TAKE 1 EACH (1 TABLET TOTAL) BY MOUTH DAILY. 05/10/12   Burnice Logan, MD   BP 159/89 mmHg  Pulse 142  Temp(Src) 98.8 F (37.1 C) (Oral)  Resp 18  Ht 4\' 10"  (1.473 m)  Wt 135 lb (61.236 kg)  BMI 28.22 kg/m2  SpO2 97%  Physical Exam  Constitutional: She is oriented to person, place,  and time. She appears well-developed and well-nourished.  HENT:  Head: Normocephalic and atraumatic.  Cardiovascular: Regular rhythm.   No murmur heard. Tachycardic  Pulmonary/Chest: Effort normal and breath sounds normal. No respiratory distress.  Abdominal: Soft. There is no tenderness. There is no rebound and no guarding.  Musculoskeletal:  Skin tear to the left upper anterior shin with local ecchymoses. Moderate erythema and edema from the upper shin to the foot on the LLE. 2+ DP pulses in bilateral lower extremities.  Neurological: She is alert and oriented to person, place, and time.  Skin: Skin is warm and dry.  Psychiatric: She has a normal mood and affect. Her behavior is normal.  Nursing note and vitals reviewed.   ED Course  Procedures (including critical care time) DIAGNOSTIC STUDIES: Oxygen Saturation is 97% on room air, normal by my interpretation.    COORDINATION OF CARE: 7:11 PM-Discussed treatment plan which includes labs, IV fluids and antibiotics with pt at bedside and pt agreed to plan.   Labs Review Labs Reviewed  CBC WITH DIFFERENTIAL/PLATELET - Abnormal; Notable for the following:    WBC 12.6 (*)    Neutrophils Relative % 84 (*)    Neutro Abs 10.6 (*)    Lymphocytes Relative 6 (*)    Monocytes Absolute 1.3 (*)    All other components within normal limits  COMPREHENSIVE METABOLIC PANEL - Abnormal; Notable for the following:    Glucose, Bld 120 (*)    BUN 28 (*)    Creatinine, Ser 1.59 (*)    GFR calc non Af Amer 30 (*)    GFR calc Af Amer 34 (*)    All other components within normal limits  CULTURE, BLOOD (ROUTINE X 2)  CULTURE, BLOOD (ROUTINE X 2)  URINE CULTURE  TROPONIN I  URINALYSIS, ROUTINE W REFLEX MICROSCOPIC  I-STAT CG4 LACTIC ACID, ED   Imaging Review Dg Chest Port 1 View  08/16/2014   CLINICAL DATA:  Laceration to left lower extremity last week. Redness and pain.  EXAM: PORTABLE CHEST - 1 VIEW  COMPARISON:  01/03/2011  FINDINGS:  Artifact overlies the chest. Heart size is normal. There is atherosclerosis of the aorta. There is a hiatal hernia. The lungs are clear. No effusions. No bony finding.  IMPRESSION: Aortic atherosclerosis. Hiatal hernia. No active disease otherwise.   Electronically Signed   By: Nelson Chimes M.D.   On: 08/16/2014 20:43     EKG Interpretation   Date/Time:  Wednesday August 16 2014 19:07:13 EDT Ventricular Rate:  124 PR  Interval:  172 QRS Duration: 70 QT Interval:  294 QTC Calculation: 422 R Axis:   31 Text Interpretation:  Sinus tachycardia Possible Left atrial enlargement  Cannot rule out Anterior infarct , age undetermined Abnormal ECG Confirmed  by Hazle Coca [phone]) on 08/16/2014 7:12:32 PM      MDM   Final diagnoses:  Cellulitis of left lower extremity   Patient here for evaluation of leg pain and swelling. She does have cellulitis of the left lower extremity. BMP demonstrates stable renal insufficiency. Given fever, tachycardia, leukocytosis patient started on broad-spectrum antibiotics with plan to [facility] for [facility] antibiotics. Discussed with patient findings studies and need for admission and patient is in agreement with plan.  I personally performed the services described in this documentation, which was scribed in my presence. The recorded information has been reviewed and is accurate.     Quintella Reichert, MD 08/17/14 [phone]

## 2014-08-16 NOTE — ED Notes (Signed)
Attempted to call report to floor but nurse is not available at this time.

## 2014-08-16 NOTE — ED Notes (Signed)
Pt reports last week she hit her left lower leg on car door and caused a laceration.  Reports it has been fine until today.  Reports today left leg started swelling with redness.  Reports painful to touch.

## 2014-08-16 NOTE — Progress Notes (Signed)
ANTIBIOTIC CONSULT NOTE - INITIAL  Pharmacy Consult for Vanco/Zosyn Indication: Cellulitis  Allergies  Allergen Reactions  . Bacitracin   . Neosporin [Neomycin-Bacitracin Zn-Polymyx]   . Polysporin [Bacitracin-Polymyxin B]     Patient Measurements: Height: 4\' 10"  (147.3 cm) Weight: 135 lb (61.236 kg) IBW/kg (Calculated) : 40.9 Adjusted Body Weight:    Vital Signs: Temp: 102.3 F (39.1 C) (04/20 1908) Temp Source: Oral (04/20 1908) BP: 152/74 mmHg (04/20 1934) Pulse Rate: 113 (04/20 1934) Intake/Output from previous day:   Intake/Output from this shift:    Labs:  Recent Labs  08/16/14 1910  WBC 12.6*  HGB 13.1  PLT 190  CREATININE 1.59*   Estimated Creatinine Clearance: 21.8 mL/min (by C-G formula based on Cr of 1.59). No results for input(s): VANCOTROUGH, VANCOPEAK, VANCORANDOM, GENTTROUGH, GENTPEAK, GENTRANDOM, TOBRATROUGH, TOBRAPEAK, TOBRARND, AMIKACINPEAK, AMIKACINTROU, AMIKACIN in the last 72 hours.   Microbiology: No results found for this or any previous visit (from the past 720 hour(s)).  Medical History: Past Medical History  Diagnosis Date  . Arthritis   . GERD (gastroesophageal reflux disease)   . Hypertension     Medications: f/u med rec  Assessment: 81 y/o F lacerated her leg on a car door last week, but today it has become red and puffy. Pt is experiencing fever/chills. Abx for cellulitis with temp 102.3 and elevated 12.6.  Goal of Therapy:  Vancomycin trough level 10-15 mcg/ml  Plan:  Vanco 1g IV x 1 then 500mg  IV q24h Zosyn 3.375g IV x 1 then 2.25g IV q8hr.   Judith Boone S. Judith Boone, PharmD, BCPS Clinical Staff Pharmacist Pager [phone]  Judith Boone 08/16/2014,7:49 PM

## 2014-08-17 DIAGNOSIS — M79609 Pain in unspecified limb: Secondary | ICD-10-CM

## 2014-08-17 DIAGNOSIS — A419 Sepsis, unspecified organism: Secondary | ICD-10-CM

## 2014-08-17 DIAGNOSIS — L03116 Cellulitis of left lower limb: Secondary | ICD-10-CM

## 2014-08-17 DIAGNOSIS — N183 Chronic kidney disease, stage 3 unspecified: Secondary | ICD-10-CM | POA: Diagnosis present

## 2014-08-17 HISTORY — DX: Sepsis, unspecified organism: A41.9

## 2014-08-17 LAB — URINALYSIS, ROUTINE W REFLEX MICROSCOPIC
Bilirubin Urine: NEGATIVE
Glucose, UA: NEGATIVE mg/dL
HGB URINE DIPSTICK: NEGATIVE
Ketones, ur: NEGATIVE mg/dL
LEUKOCYTES UA: NEGATIVE
NITRITE: NEGATIVE
PROTEIN: NEGATIVE mg/dL
Specific Gravity, Urine: 1.009 (ref 1.005–1.030)
UROBILINOGEN UA: 0.2 mg/dL (ref 0.0–1.0)
pH: 7 (ref 5.0–8.0)

## 2014-08-17 LAB — CBC
HCT: 32 % — ABNORMAL LOW (ref 36.0–46.0)
Hemoglobin: 11 g/dL — ABNORMAL LOW (ref 12.0–15.0)
MCH: 33.4 pg (ref 26.0–34.0)
MCHC: 34.4 g/dL (ref 30.0–36.0)
MCV: 97.3 fL (ref 78.0–100.0)
PLATELETS: 175 10*3/uL (ref 150–400)
RBC: 3.29 MIL/uL — AB (ref 3.87–5.11)
RDW: 14.3 % (ref 11.5–15.5)
WBC: 12.8 10*3/uL — ABNORMAL HIGH (ref 4.0–10.5)

## 2014-08-17 LAB — BASIC METABOLIC PANEL
Anion gap: 11 (ref 5–15)
BUN: 20 mg/dL (ref 6–23)
CO2: 22 mmol/L (ref 19–32)
Calcium: 7.8 mg/dL — ABNORMAL LOW (ref 8.4–10.5)
Chloride: 106 mmol/L (ref 96–112)
Creatinine, Ser: 1.45 mg/dL — ABNORMAL HIGH (ref 0.50–1.10)
GFR, EST AFRICAN AMERICAN: 38 mL/min — AB (ref >90)
GFR, EST NON AFRICAN AMERICAN: 33 mL/min — AB (ref >90)
GLUCOSE: 94 mg/dL (ref 70–99)
POTASSIUM: 4.2 mmol/L (ref 3.5–5.1)
SODIUM: 139 mmol/L (ref 135–145)

## 2014-08-17 MED ORDER — SODIUM CHLORIDE 0.9 % IV SOLN
INTRAVENOUS | Status: DC
  Administered 2014-08-17: 05:00:00 via INTRAVENOUS

## 2014-08-17 MED ORDER — PNEUMOCOCCAL VAC POLYVALENT 25 MCG/0.5ML IJ INJ
0.5000 mL | INJECTION | INTRAMUSCULAR | Status: AC
  Administered 2014-08-19: 0.5 mL via INTRAMUSCULAR
  Filled 2014-08-17: qty 0.5

## 2014-08-17 MED ORDER — VITAMIN C 500 MG PO TABS
500.0000 mg | ORAL_TABLET | Freq: Every day | ORAL | Status: DC
  Administered 2014-08-17 – 2014-08-20 (×4): 500 mg via ORAL
  Filled 2014-08-17 (×4): qty 1

## 2014-08-17 MED ORDER — PANTOPRAZOLE SODIUM 40 MG PO TBEC
40.0000 mg | DELAYED_RELEASE_TABLET | Freq: Every day | ORAL | Status: DC
  Administered 2014-08-17 – 2014-08-20 (×4): 40 mg via ORAL
  Filled 2014-08-17 (×4): qty 1

## 2014-08-17 MED ORDER — HEPARIN SODIUM (PORCINE) 5000 UNIT/ML IJ SOLN
5000.0000 [IU] | Freq: Three times a day (TID) | INTRAMUSCULAR | Status: DC
  Administered 2014-08-17 – 2014-08-20 (×9): 5000 [IU] via SUBCUTANEOUS
  Filled 2014-08-17 (×9): qty 1

## 2014-08-17 MED ORDER — TRAMADOL HCL 50 MG PO TABS
50.0000 mg | ORAL_TABLET | Freq: Every day | ORAL | Status: DC
  Administered 2014-08-17 – 2014-08-19 (×4): 50 mg via ORAL
  Filled 2014-08-17 (×4): qty 1

## 2014-08-17 MED ORDER — LORATADINE 10 MG PO TABS
10.0000 mg | ORAL_TABLET | Freq: Every day | ORAL | Status: DC
  Administered 2014-08-17 – 2014-08-20 (×4): 10 mg via ORAL
  Filled 2014-08-17 (×4): qty 1

## 2014-08-17 MED ORDER — CEFTRIAXONE SODIUM IN DEXTROSE 20 MG/ML IV SOLN
1.0000 g | INTRAVENOUS | Status: DC
  Administered 2014-08-17 – 2014-08-18 (×2): 1 g via INTRAVENOUS
  Filled 2014-08-17 (×2): qty 50

## 2014-08-17 MED ORDER — ACETAMINOPHEN 325 MG PO TABS
650.0000 mg | ORAL_TABLET | Freq: Four times a day (QID) | ORAL | Status: DC | PRN
  Administered 2014-08-17 – 2014-08-18 (×4): 650 mg via ORAL
  Filled 2014-08-17 (×4): qty 2

## 2014-08-17 MED ORDER — LISINOPRIL 40 MG PO TABS
40.0000 mg | ORAL_TABLET | Freq: Every day | ORAL | Status: DC
  Administered 2014-08-17 – 2014-08-19 (×3): 40 mg via ORAL
  Filled 2014-08-17 (×3): qty 1

## 2014-08-17 MED ORDER — ASPIRIN EC 81 MG PO TBEC
81.0000 mg | DELAYED_RELEASE_TABLET | ORAL | Status: DC
Start: 2014-08-18 — End: 2014-08-20
  Administered 2014-08-18: 81 mg via ORAL
  Filled 2014-08-17: qty 1

## 2014-08-17 MED ORDER — LISINOPRIL 20 MG PO TABS
20.0000 mg | ORAL_TABLET | Freq: Every day | ORAL | Status: DC
  Administered 2014-08-17: 20 mg via ORAL
  Filled 2014-08-17: qty 1

## 2014-08-17 NOTE — Progress Notes (Signed)
Left lower extremity venous duplex completed.  Left:  No evidence of DVT, superficial thrombosis, or Baker's cyst.  Right:  Negative for DVT in the common femoral vein.  

## 2014-08-17 NOTE — Progress Notes (Signed)
Patient Demographics  Judith Boone, is a 81 y.o. female, DOB - 02/25/1932, [id number]  Admit date - 08/16/2014   Admitting Physician Ivor Costa, MD  Outpatient Primary MD for the patient is Velna Hatchet, MD  LOS - 1   Chief Complaint  Patient presents with  . Leg Pain        Subjective:   Judith Boone today has, No headache, No chest pain, No abdominal pain - No Nausea, No new weakness tingling or numbness, No Cough - SOB.  L leg no pain.   Assessment & Plan    1. Sepsis due to left leg cellulitis. Wound appears stable, injury was more than 1 week ago, currently on cellulitis partway and on Vanco and Rocephin. Follow cultures. Continue wound care. PT eval.   2. Essential hypertension. On home dose lisinopril and stable.   3. CJD stage III. Baseline creatinine appears to be 1.4 in 2014. This could be her new baseline. Monitor with gentle hydration.   Code Status: Full  Family Communication: none  Disposition Plan: Home   Procedures     Consults none   Medications  Scheduled Meds: . [START ON 08/18/2014] aspirin EC  81 mg Oral Q M,W,F  . cefTRIAXone (ROCEPHIN)  IV  1 g Intravenous Q24H  . heparin  5,000 Units Subcutaneous 3 times per day  . lisinopril  20 mg Oral QHS  . loratadine  10 mg Oral Daily  . pantoprazole  40 mg Oral Daily  . [START ON 08/18/2014] pneumococcal 23 valent vaccine  0.5 mL Intramuscular Tomorrow-1000  . sodium chloride  1,000 mL Intravenous Once  . traMADol  50 mg Oral QHS  . vitamin C  500 mg Oral Daily   Continuous Infusions: . sodium chloride 125 mL/hr at 08/17/14 0700  . sodium chloride 75 mL/hr at 08/17/14 0520   PRN Meds:.acetaminophen  DVT Prophylaxis  Heparin    Lab Results  Component Value Date   PLT PENDING 08/17/2014     Antibiotics     Anti-infectives    Start     Dose/Rate Route Frequency Ordered Stop   08/17/14 1930  vancomycin (VANCOCIN) 500 mg in sodium chloride 0.9 % 100 mL IVPB  Status:  Discontinued     500 mg 100 mL/hr over 60 Minutes Intravenous Every 24 hours 08/16/14 1957 08/17/14 0035   08/17/14 0600  piperacillin-tazobactam (ZOSYN) IVPB 2.25 g  Status:  Discontinued     2.25 g 100 mL/hr over 30 Minutes Intravenous 3 times per day 08/16/14 1957 08/17/14 0035   08/17/14 0600  cefTRIAXone (ROCEPHIN) 1 g in dextrose 5 % 50 mL IVPB - Premix     1 g 100 mL/hr over 30 Minutes Intravenous Every 24 hours 08/17/14 0036     08/16/14 1915  piperacillin-tazobactam (ZOSYN) IVPB 3.375 g     3.375 g 100 mL/hr over 30 Minutes Intravenous  Once 08/16/14 1913 08/16/14 2113   08/16/14 1915  vancomycin (VANCOCIN) IVPB 1000 mg/200 mL premix     1,000 mg 200 mL/hr over 60 Minutes Intravenous  Once 08/16/14 1913 08/16/14 2047          Objective:   Filed Vitals:   08/16/14 2047 08/16/14 2100 08/16/14 2149 08/17/14 [id number]  BP:  157/62 153/56 127/56  Pulse:   102 75  Temp: 100.5 F (38.1 C)  100.8 F (38.2 C) 97.9 F (36.6 C)  TempSrc: Oral  Oral   Resp:  21 20 20   Height:      Weight:      SpO2:   95% 100%    Wt Readings from Last 3 Encounters:  08/16/14 61.236 kg (135 lb)  09/28/12 62.143 kg (137 lb)  06/22/12 62.143 kg (137 lb)     Intake/Output Summary (Last 24 hours) at 08/17/14 1109 Last data filed at 08/17/14 0700  Gross per 24 hour  Intake 754.17 ml  Output    600 ml  Net 154.17 ml     Physical Exam  Awake Alert, Oriented X 3, No new F.N deficits, Normal affect [place].AT,PERRAL Supple Neck,No JVD, No cervical lymphadenopathy appriciated.  Symmetrical Chest wall movement, Good air movement bilaterally, CTAB RRR,No Gallops,Rubs or new Murmurs, No Parasternal Heave +ve B.Sounds, Abd Soft, No tenderness, No organomegaly appriciated, No rebound - guarding or rigidity. No  Cyanosis, Clubbing or edema, No new Rash or bruise  Left leg small laceration stable, cellulitis below knee.   Data Review   Micro Results Recent Results (from the past 240 hour(s))  Blood Culture (routine x 2)     Status: None (Preliminary result)   Collection Time: 08/16/14  7:10 PM  Result Value Ref Range Status   Specimen Description BLOOD RIGHT AC  Final   Special Requests BOTTLES DRAWN AEROBIC AND ANAEROBIC 5CC EACH  Final   Culture   Final    GRAM NEGATIVE RODS Note: Gram Stain Report Called to,Read Back By and Verified With: CAROLINE MOSLEY RN 08/17/14 AT 26 BY [facility] Performed at [facility]    Report Status PENDING  Incomplete  Blood Culture (routine x 2)     Status: None (Preliminary result)   Collection Time: 08/16/14  7:15 PM  Result Value Ref Range Status   Specimen Description BLOOD LEFAT FA  Final   Special Requests BOTTLES DRAWN AEROBIC AND ANAEROBIC 5CC EACH  Final   Culture   Final    GRAM NEGATIVE RODS Note: Gram Stain Report Called to,Read Back By and Verified With: CAROLINE MOSLEY RN 08/17/14 AT 1105 AM BY [facility] Performed at [facility]    Report Status PENDING  Incomplete    Radiology Reports Dg Chest Port 1 View  08/16/2014   CLINICAL DATA:  Laceration to left lower extremity last week. Redness and pain.  EXAM: PORTABLE CHEST - 1 VIEW  COMPARISON:  01/03/2011  FINDINGS: Artifact overlies the chest. Heart size is normal. There is atherosclerosis of the aorta. There is a hiatal hernia. The lungs are clear. No effusions. No bony finding.  IMPRESSION: Aortic atherosclerosis. Hiatal hernia. No active disease otherwise.   Electronically Signed   By: Nelson Chimes M.D.   On: 08/16/2014 20:43     CBC  Recent Labs Lab 08/16/14 1910 08/17/14 0820  WBC 12.6* PENDING  HGB 13.1 11.0*  HCT 39.0 32.0*  PLT 190 PENDING  MCV 97.3 97.3  MCH 32.7 33.4  MCHC 33.6 34.4  RDW 14.0 14.3  LYMPHSABS 0.8  --   MONOABS 1.3*  --   EOSABS 0.0  --    BASOSABS 0.0  --     Chemistries   Recent Labs Lab 08/16/14 1910 08/17/14 0820  NA 138 139  K 4.2 4.2  CL 101 106  CO2 28 22  GLUCOSE 120*  94  BUN 28* 20  CREATININE 1.59* 1.45*  CALCIUM 9.0 7.8*  AST 28  --   ALT 19  --   ALKPHOS 72  --   BILITOT 0.6  --    ------------------------------------------------------------------------------------------------------------------ estimated creatinine clearance is 23.9 mL/min (by C-G formula based on Cr of 1.45). ------------------------------------------------------------------------------------------------------------------ No results for input(s): HGBA1C in the last 72 hours. ------------------------------------------------------------------------------------------------------------------ No results for input(s): CHOL, HDL, LDLCALC, TRIG, CHOLHDL, LDLDIRECT in the last 72 hours. ------------------------------------------------------------------------------------------------------------------ No results for input(s): TSH, T4TOTAL, T3FREE, THYROIDAB in the last 72 hours.  Invalid input(s): FREET3 ------------------------------------------------------------------------------------------------------------------ No results for input(s): VITAMINB12, FOLATE, FERRITIN, TIBC, IRON, RETICCTPCT in the last 72 hours.  Coagulation profile No results for input(s): INR, PROTIME in the last 168 hours.  No results for input(s): DDIMER in the last 72 hours.  Cardiac Enzymes  Recent Labs Lab 08/16/14 1910  TROPONINI 0.03   ------------------------------------------------------------------------------------------------------------------ Invalid input(s): POCBNP     Time Spent in minutes   35   Efrat Zuidema K M.D on 08/17/2014 at 11:09 AM  Between 7am to 7pm - Pager - [phone]  After 7pm go to [email or web address] - password [facility]  [facility]   Office  [phone]

## 2014-08-17 NOTE — Progress Notes (Signed)
Utilization review completed.  

## 2014-08-17 NOTE — H&P (Signed)
[facility] History and Physical  Judith Boone [id number] DOB: 1931/10/26 DOA: 08/16/2014  Referring physician: EDP PCP: Velna Hatchet, MD   Chief Complaint: Leg pain   HPI: Judith Boone is a 81 y.o. female who presents to the ED with left lower leg and shin pain, redness, swelling, fever.  Symptoms onset 1 week ago when she cut her leg on a car door, bled a lot at that time, but over the past 1 day has gotten red, puffy, and she has developed fever and chills.  No h/o MRSA, no history of skin infections.  Review of Systems: Systems reviewed.  As above, otherwise negative  Past Medical History  Diagnosis Date  . Arthritis   . GERD (gastroesophageal reflux disease)   . Hypertension    Past Surgical History  Procedure Laterality Date  . Cataract extraction    . Broken ankle repair      left  . Total knee arthroplasty      right  . Abdominal hysterectomy     Social History:  reports that she has quit smoking. She has never used smokeless tobacco. She reports that she drinks alcohol. She reports that she does not use illicit drugs.  Allergies  Allergen Reactions  . Bacitracin     unknown  . Neosporin [Neomycin-Bacitracin Zn-Polymyx]     unknown  . Polysporin [Bacitracin-Polymyxin B]     unknown    Family History  Problem Relation Age of Onset  . Alcohol abuse Father   . Heart disease Father   . Hypertension Father      Prior to Admission medications   Medication Sig Start Date End Date Taking? Authorizing Provider  Ascorbic Acid (VITAMIN C) 500 MG tablet Take 500 mg by mouth daily. 2 per day    Yes Historical Provider, MD  aspirin 81 MG tablet Take 81 mg by mouth 3 (three) times a week. No specific day   Yes Historical Provider, MD  calcium carbonate (OS-CAL) 600 MG TABS Take 600 mg by mouth 2 (two) times daily with a meal.     Yes Historical Provider, MD  Cholecalciferol (VITAMIN D3) 1000 UNITS CAPS Take 1 capsule by mouth 2 (two) times daily. 2 per day    Yes Historical Provider, MD  CRESTOR 5 MG tablet TAKE 1 TALET TWICE A WEEK 07/23/12  Yes Mosie Lukes, MD  HYDRALAZINE HCL PO Take 1 tablet by mouth 2 (two) times daily.    Yes Historical Provider, MD  loratadine (CLARITIN) 10 MG tablet Take 10 mg by mouth daily.   Yes Historical Provider, MD  Multiple Vitamins-Minerals ([facility] SPECTRAVITE PO) Take 1 tablet by mouth daily.    Yes Historical Provider, MD  omeprazole (PRILOSEC) 20 MG capsule  05/22/12  Yes Historical Provider, MD  quinapril (ACCUPRIL) 20 MG tablet TAKE 1 TABLET AT BEDTIME 05/10/12  Yes Burnice Logan, MD  traMADol (ULTRAM) 50 MG tablet Take 50 mg by mouth at bedtime.   Yes Historical Provider, MD   Physical Exam: Filed Vitals:   08/16/14 2149  BP: 153/56  Pulse: 102  Temp: 100.8 F (38.2 C)  Resp: 20    BP 153/56 mmHg  Pulse 102  Temp(Src) 100.8 F (38.2 C) (Oral)  Resp 20  Ht 4\' 10"  (1.473 m)  Wt 61.236 kg (135 lb)  BMI 28.22 kg/m2  SpO2 95%  General Appearance:    Alert, oriented, no distress, appears stated age  Head:    Normocephalic, atraumatic  Eyes:  PERRL, EOMI, sclera non-icteric        Nose:   Nares without drainage or epistaxis. Mucosa, turbinates normal  Throat:   Moist mucous membranes. Oropharynx without erythema or exudate.  Neck:   Supple. No carotid bruits.  No thyromegaly.  No lymphadenopathy.   Back:     No CVA tenderness, no spinal tenderness  Lungs:     Clear to auscultation bilaterally, without wheezes, rhonchi or rales  Chest wall:    No tenderness to palpitation  Heart:    Regular rate and rhythm without murmurs, gallops, rubs  Abdomen:     Soft, non-tender, nondistended, normal bowel sounds, no organomegaly  Genitalia:    deferred  Rectal:    deferred  Extremities:   No clubbing, cyanosis or edema.  Pulses:   2+ and symmetric all extremities  Skin:   Skin color, texture, turgor normal, no rashes or lesions  Lymph nodes:   Cervical, supraclavicular, and axillary nodes normal   Neurologic:   CNII-XII intact. Normal strength, sensation and reflexes      throughout    Labs on Admission:  Basic Metabolic Panel:  Recent Labs Lab 08/16/14 1910  NA 138  K 4.2  CL 101  CO2 28  GLUCOSE 120*  BUN 28*  CREATININE 1.59*  CALCIUM 9.0   Liver Function Tests:  Recent Labs Lab 08/16/14 1910  AST 28  ALT 19  ALKPHOS 72  BILITOT 0.6  PROT 7.1  ALBUMIN 4.2   No results for input(s): LIPASE, AMYLASE in the last 168 hours. No results for input(s): AMMONIA in the last 168 hours. CBC:  Recent Labs Lab 08/16/14 1910  WBC 12.6*  NEUTROABS 10.6*  HGB 13.1  HCT 39.0  MCV 97.3  PLT 190   Cardiac Enzymes:  Recent Labs Lab 08/16/14 1910  TROPONINI 0.03    BNP (last 3 results) No results for input(s): PROBNP in the last 8760 hours. CBG: No results for input(s): GLUCAP in the last 168 hours.  Radiological Exams on Admission: Dg Chest Port 1 View  08/16/2014   CLINICAL DATA:  Laceration to left lower extremity last week. Redness and pain.  EXAM: PORTABLE CHEST - 1 VIEW  COMPARISON:  01/03/2011  FINDINGS: Artifact overlies the chest. Heart size is normal. There is atherosclerosis of the aorta. There is a hiatal hernia. The lungs are clear. No effusions. No bony finding.  IMPRESSION: Aortic atherosclerosis. Hiatal hernia. No active disease otherwise.   Electronically Signed   By: Nelson Chimes M.D.   On: 08/16/2014 20:43    EKG: Independently reviewed.  Assessment/Plan Principal Problem:   Cellulitis of leg, left Active Problems:   CKD (chronic kidney disease) stage 3, GFR 30-59 ml/min   Sepsis   1. Left leg cellulitis causing sepsis - got zosyn and vanc in ED x1 dose 1. Cultures pending 2. Will stop zosyn and vanc and instead use the cellulitis pathway 3. Rocephin ordered as per moderate non-purulent disease on cellulitis pathway. 4. Tylenol PRN fever 5. IVF with NS 2. HTN - continue home meds 3. CKD stage 3 - creatinine 1.59 today, last we  have on record was 1.2-1.4 back in 2014.  Unclear if there is an acute component vs worsening of CKD. 1. Trend BMP 2. IVF as above    Code Status: Full Code  Family Communication: No family in room Disposition Plan: Admit to inpatient   Time spent: 39 min  Romaine Maciolek M. [facility] Pager [phone]  If 7AM-7PM,  please contact the day team taking care of the patient [email or web address] Password [facility] 08/17/2014, 12:37 AM

## 2014-08-17 NOTE — Evaluation (Signed)
Physical Therapy Evaluation Patient Details Name: Judith Boone MRN: [id number] DOB: 12/29/1931 Today's Date: 08/17/2014   History of Present Illness  Judith Boone is a 81 y.o. female admitted with left LE cellulitis.  PMH positive for TKA bilat, arthritis, GERD, HTN and previous ankle repair.  Clinical Impression  Patient presents with decreased mobility mainly due to painful left LE.  She is increased fall risk due to pain, new need for assistive device and change in medications.  Would benefit from initial 24 hour assist at hers or son's home.  Will need follow up HHPT at d/c.  Will follow acutely.    Follow Up Recommendations Home health PT;Supervision for mobility/OOB    Equipment Recommendations  None recommended by PT    Recommendations for Other Services       Precautions / Restrictions Precautions Precautions: Fall      Mobility  Bed Mobility               General bed mobility comments: up in chair  Transfers Overall transfer level: Needs assistance Equipment used: Rolling walker (2 wheeled) Transfers: Sit to/from Stand Sit to Stand: Supervision         General transfer comment: good use of armrests to stand; cues to use walker to prevent increased pain left LE  Ambulation/Gait Ambulation/Gait assistance: Min guard Ambulation Distance (Feet): 90 Feet Assistive device: Rolling walker (2 wheeled) Gait Pattern/deviations: Step-to pattern;Decreased stride length;Ataxic        Stairs            Wheelchair Mobility    Modified Rankin (Stroke Patients Only)       Balance Overall balance assessment: Needs assistance         Standing balance support: No upper extremity supported Standing balance-Leahy Scale: Fair Standing balance comment: can stand without UE support, but walking needs it due to painful left LE                             Pertinent Vitals/Pain Pain Assessment: Faces Faces Pain Scale: Hurts whole lot Pain  Location: left posterior lower leg with ambulation Pain Descriptors / Indicators: Aching Pain Intervention(s): Monitored during session;Repositioned    Home Living Family/patient expects to be discharged to:: Private residence Living Arrangements: Alone Available Help at Discharge: Family;Available PRN/intermittently;Available 24 hours/day (could ask son to assist 24 hours couple of days) Type of Home: Other(Comment) (townhome) Home Access: Level entry     Home Layout: One level Home Equipment: Walker - 2 wheels;Grab bars - tub/shower;Grab bars - toilet (not sure if RW, but states had it s/p TKA 4 years ago) Additional Comments: states she can stay with son or he can stay with her few days if needed; his home 1 level with 4 STE with rails    Prior Function Level of Independence: Independent               Hand Dominance   Dominant Hand: Right    Extremity/Trunk Assessment               Lower Extremity Assessment: Generalized weakness      Cervical / Trunk Assessment: Kyphotic;Other exceptions  Communication   Communication: No difficulties  Cognition Arousal/Alertness: Awake/alert Behavior During Therapy: WFL for tasks assessed/performed Overall Cognitive Status: Within Functional Limits for tasks assessed                      General  Comments General comments (skin integrity, edema, etc.): due to location of pain in left LE noted positive Homan's paged MD regarding potential need to r/o DVT    Exercises General Exercises - Lower Extremity Ankle Circles/Pumps: AROM;10 reps;Seated      Assessment/Plan    PT Assessment Patient needs continued PT services  PT Diagnosis Difficulty walking;Acute pain   PT Problem List Decreased strength;Decreased mobility;Decreased activity tolerance;Decreased safety awareness;Decreased balance;Decreased knowledge of use of DME;Pain  PT Treatment Interventions DME instruction;Therapeutic exercise;Gait  training;Balance training;Stair training;Functional mobility training;Therapeutic activities   PT Goals (Current goals can be found in the Care Plan section) Acute Rehab PT Goals Patient Stated Goal: To return to independent PT Goal Formulation: With patient Time For Goal Achievement: 08/24/14 Potential to Achieve Goals: Good    Frequency Min 3X/week   Barriers to discharge        Co-evaluation               End of Session Equipment Utilized During Treatment: Gait belt Activity Tolerance: Patient limited by pain Patient left: in chair Nurse Communication: Mobility status    Functional Assessment Tool Used: Clinical Judgement Functional Limitation: Mobility: Walking and moving around Mobility: Walking and Moving Around Current Status [id number]): At least 20 percent but less than 40 percent impaired, limited or restricted Mobility: Walking and Moving Around Goal Status [id number]): At least 1 percent but less than 20 percent impaired, limited or restricted    Time: 1359-1417 PT Time Calculation (min) (ACUTE ONLY): 18 min   Charges:   PT Evaluation $Initial PT Evaluation Tier I: 1 Procedure PT Treatments $Gait Training: 8-22 mins   PT G Codes:   PT G-Codes **NOT FOR INPATIENT CLASS** Functional Assessment Tool Used: Clinical Judgement Functional Limitation: Mobility: Walking and moving around Mobility: Walking and Moving Around Current Status [id number]): At least 20 percent but less than 40 percent impaired, limited or restricted Mobility: Walking and Moving Around Goal Status [phone]): At least 1 percent but less than 20 percent impaired, limited or restricted    [facility] 08/17/2014, 2:50 PM  [place], [place] 08/17/2014

## 2014-08-18 LAB — CBC
HCT: 29.7 % — ABNORMAL LOW (ref 36.0–46.0)
HEMOGLOBIN: 10.1 g/dL — AB (ref 12.0–15.0)
MCH: 32.7 pg (ref 26.0–34.0)
MCHC: 34 g/dL (ref 30.0–36.0)
MCV: 96.1 fL (ref 78.0–100.0)
Platelets: 138 10*3/uL — ABNORMAL LOW (ref 150–400)
RBC: 3.09 MIL/uL — AB (ref 3.87–5.11)
RDW: 14.5 % (ref 11.5–15.5)
WBC: 10.3 10*3/uL (ref 4.0–10.5)

## 2014-08-18 LAB — URINE CULTURE
CULTURE: NO GROWTH
Colony Count: NO GROWTH

## 2014-08-18 MED ORDER — TETANUS-DIPHTH-ACELL PERTUSSIS 5-2.5-18.5 LF-MCG/0.5 IM SUSP
0.5000 mL | Freq: Once | INTRAMUSCULAR | Status: AC
  Administered 2014-08-19: 0.5 mL via INTRAMUSCULAR
  Filled 2014-08-18 (×2): qty 0.5

## 2014-08-18 MED ORDER — CEFTRIAXONE SODIUM IN DEXTROSE 40 MG/ML IV SOLN
2.0000 g | INTRAVENOUS | Status: AC
  Administered 2014-08-19: 2 g via INTRAVENOUS
  Filled 2014-08-18 (×2): qty 50

## 2014-08-18 NOTE — Progress Notes (Signed)
Appears to be new onset of redness moving from lower left extremity to inner thigh of left leg. Pt reports tenderness and says, "it appeared after blood clot testing." Doctor on call made aware.

## 2014-08-18 NOTE — Care Management Note (Signed)
CARE MANAGEMENT NOTE 08/18/2014  Patient:  Judith Boone, Judith Boone   Account Number:  [id number]  Date Initiated:  08/18/2014  Documentation initiated by:  Ricki Miller  Subjective/Objective Assessment:   81 yr old female admitted with cellulitis of left lower leg.     Action/Plan:   Case manager spoke with patient concerning home health needs, choice offered. Patient requests [facility], Referral called to Ubaldo Glassing, [place] Liaison. Patient has RW at home. States she has support at Discharge.   Anticipated DC Date:  08/19/2014   Anticipated DC Plan:  Central  CM consult       [facility] Choice  HOME HEALTH   Choice offered to / List presented to:  C-1 Patient   DME arranged  NA        [place] arranged  HH-2 PT      [place]   Status of service:  Completed, signed off Medicare Important Message given?   (If response is "NO", the following Medicare IM given date fields will be blank) Date Medicare IM given:   Medicare IM given by:   Date Additional Medicare IM given:   Additional Medicare IM given by:    Discharge Disposition:  [place]  Per UR Regulation:  Reviewed for med. necessity/level of care/duration of stay

## 2014-08-18 NOTE — Progress Notes (Signed)
Physical Therapy Treatment Patient Details Name: Judith Boone MRN: [id number] DOB: Dec 05, 1931 Today's Date: 2014/08/23    History of Present Illness Judith Boone is a 81 y.o. female admitted with left LE cellulitis.  PMH positive for TKA bilat, arthritis, GERD, HTN and previous ankle repair.    PT Comments    Progressing with mobility, tolerance to weight on left LE and endurance.  Will benefit still from family providing assist initially and HHPT.  Follow Up Recommendations  Home health PT     Equipment Recommendations  None recommended by PT    Recommendations for Other Services       Precautions / Restrictions Precautions Precautions: Fall    Mobility  Bed Mobility               General bed mobility comments: up in chair  Transfers   Equipment used: Rolling walker (2 wheeled)   Sit to Stand: Modified independent (Device/Increase time)            Ambulation/Gait Ambulation/Gait assistance: Supervision Ambulation Distance (Feet): 200 Feet Assistive device: Rolling walker (2 wheeled) Gait Pattern/deviations: Step-through pattern;Decreased stride length     General Gait Details: demonstrates good pattern without evidence of antalgia today   Stairs            Wheelchair Mobility    Modified Rankin (Stroke Patients Only)       Balance Overall balance assessment: Needs assistance           Standing balance-Leahy Scale: Fair Standing balance comment: washes hands and face at sink without UE assist, but when stepping back minguard provided to prevent LOB                    Cognition Arousal/Alertness: Awake/alert Behavior During Therapy: WFL for tasks assessed/performed Overall Cognitive Status: Within Functional Limits for tasks assessed                      Exercises      General Comments        Pertinent Vitals/Pain Pain Score: 5  Pain Location: left lower leg Pain Descriptors / Indicators: Aching Pain  Intervention(s): Monitored during session    Home Living                      Prior Function            PT Goals (current goals can now be found in the care plan section) Progress towards PT goals: Progressing toward goals    Frequency  Min 3X/week    PT Plan Current plan remains appropriate    Co-evaluation             End of Session Equipment Utilized During Treatment: Gait belt Activity Tolerance: Patient tolerated treatment well Patient left: in chair;with call bell/phone within reach     Time: 1219-1243 PT Time Calculation (min) (ACUTE ONLY): 24 min  Charges:  $Gait Training: 8-22 mins $Therapeutic Activity: 8-22 mins                    G Codes:      Judith Boone,Judith Boone 23-Aug-2014, 1:17 PM  Judith Boone, Judith Boone 08-23-2014

## 2014-08-18 NOTE — Progress Notes (Signed)
Patient Demographics  Judith Boone, is a 81 y.o. female, DOB - Mar 21, 1932, [id number]  Admit date - 08/16/2014   Admitting Physician Ivor Costa, MD  Outpatient Primary MD for the patient is Velna Hatchet, MD  LOS - 2   Chief Complaint  Patient presents with  . Leg Pain        Subjective:   Judith Boone today has, No headache, No chest pain, No abdominal pain - No Nausea, No new weakness tingling or numbness, No Cough - SOB.  L leg no pain.   Assessment & Plan    1. Sepsis due to left leg cellulitis. Wound appears stable, injury was more than 1 week ago, has posterior provided, blood cultures growing gram-negative rods, clinically appears to be responding well on antibiotics per cellulitis pathway which are Vanco and Rocephin. Monitor final culture sensitivity and adjust. Continue wound care. PT eval.   2. Essential hypertension. On home dose lisinopril and stable.   3. CKD stage III. Baseline creatinine appears to be 1.4 in 2014. This could be her new baseline. Monitor with gentle hydration.    4. GERD. On PPI.    Code Status: Full  Family Communication: none  Disposition Plan: HHPT/RN   Procedures     Consults none   Medications  Scheduled Meds: . aspirin EC  81 mg Oral Q M,W,F  . cefTRIAXone (ROCEPHIN)  IV  1 g Intravenous Q24H  . heparin  5,000 Units Subcutaneous 3 times per day  . lisinopril  40 mg Oral QHS  . loratadine  10 mg Oral Daily  . pantoprazole  40 mg Oral Daily  . pneumococcal 23 valent vaccine  0.5 mL Intramuscular Tomorrow-1000  . sodium chloride  1,000 mL Intravenous Once  . Tdap  0.5 mL Intramuscular Once  . traMADol  50 mg Oral QHS  . vitamin C  500 mg Oral Daily   Continuous Infusions:   PRN Meds:.acetaminophen  DVT Prophylaxis  Heparin     Lab Results  Component Value Date   PLT 138* 08/18/2014    Antibiotics     Anti-infectives    Start     Dose/Rate Route Frequency Ordered Stop   08/17/14 1930  vancomycin (VANCOCIN) 500 mg in sodium chloride 0.9 % 100 mL IVPB  Status:  Discontinued     500 mg 100 mL/hr over 60 Minutes Intravenous Every 24 hours 08/16/14 1957 08/17/14 0035   08/17/14 0600  piperacillin-tazobactam (ZOSYN) IVPB 2.25 g  Status:  Discontinued     2.25 g 100 mL/hr over 30 Minutes Intravenous 3 times per day 08/16/14 1957 08/17/14 0035   08/17/14 0600  cefTRIAXone (ROCEPHIN) 1 g in dextrose 5 % 50 mL IVPB - Premix     1 g 100 mL/hr over 30 Minutes Intravenous Every 24 hours 08/17/14 0036     08/16/14 1915  piperacillin-tazobactam (ZOSYN) IVPB 3.375 g     3.375 g 100 mL/hr over 30 Minutes Intravenous  Once 08/16/14 1913 08/16/14 2113   08/16/14 1915  vancomycin (VANCOCIN) IVPB 1000 mg/200 mL premix     1,000 mg 200 mL/hr over 60 Minutes Intravenous  Once 08/16/14 1913 08/16/14 2047          Objective:   Danley Danker  Vitals:   08/17/14 0620 08/17/14 1300 08/17/14 2003 08/18/14 0512  BP: 127/56 133/78 141/57 149/55  Pulse: 75 80 76 92  Temp: 97.9 F (36.6 C) 98.1 F (36.7 C) 98.2 F (36.8 C) 99.3 F (37.4 C)  TempSrc:  Oral    Resp: 20 18 18 16   Height:      Weight:      SpO2: 100% 98% 99% 95%    Wt Readings from Last 3 Encounters:  08/16/14 61.236 kg (135 lb)  09/28/12 62.143 kg (137 lb)  06/22/12 62.143 kg (137 lb)     Intake/Output Summary (Last 24 hours) at 08/18/14 0944 Last data filed at 08/18/14 0528  Gross per 24 hour  Intake    290 ml  Output      0 ml  Net    290 ml     Physical Exam  Awake Alert, Oriented X 3, No new F.N deficits, Normal affect [place].AT,PERRAL Supple Neck,No JVD, No cervical lymphadenopathy appriciated.  Symmetrical Chest wall movement, Good air movement bilaterally, CTAB RRR,No Gallops,Rubs or new Murmurs, No Parasternal Heave +ve B.Sounds, Abd Soft,  No tenderness, No organomegaly appriciated, No rebound - guarding or rigidity. No Cyanosis, Clubbing or edema, No new Rash or bruise  Left leg small laceration stable, cellulitis below knee.   Data Review   Micro Results Recent Results (from the past 240 hour(s))  Blood Culture (routine x 2)     Status: None (Preliminary result)   Collection Time: 08/16/14  7:10 PM  Result Value Ref Range Status   Specimen Description BLOOD RIGHT AC  Final   Special Requests BOTTLES DRAWN AEROBIC AND ANAEROBIC 5CC EACH  Final   Culture   Final    GRAM NEGATIVE RODS Note: Gram Stain Report Called to,Read Back By and Verified With: CAROLINE MOSLEY RN 08/17/14 AT 33 BY [facility] Performed at [facility]    Report Status PENDING  Incomplete  Blood Culture (routine x 2)     Status: None (Preliminary result)   Collection Time: 08/16/14  7:15 PM  Result Value Ref Range Status   Specimen Description BLOOD LEFAT FA  Final   Special Requests BOTTLES DRAWN AEROBIC AND ANAEROBIC 5CC EACH  Final   Culture   Final    GRAM NEGATIVE RODS Note: Gram Stain Report Called to,Read Back By and Verified With: CAROLINE MOSLEY RN 08/17/14 AT 1105 AM BY [facility] Performed at [facility]    Report Status PENDING  Incomplete    Radiology Reports Dg Chest Port 1 View  08/16/2014   CLINICAL DATA:  Laceration to left lower extremity last week. Redness and pain.  EXAM: PORTABLE CHEST - 1 VIEW  COMPARISON:  01/03/2011  FINDINGS: Artifact overlies the chest. Heart size is normal. There is atherosclerosis of the aorta. There is a hiatal hernia. The lungs are clear. No effusions. No bony finding.  IMPRESSION: Aortic atherosclerosis. Hiatal hernia. No active disease otherwise.   Electronically Signed   By: Nelson Chimes M.D.   On: 08/16/2014 20:43     CBC  Recent Labs Lab 08/16/14 1910 08/17/14 0820 08/18/14 0618  WBC 12.6* 12.8* 10.3  HGB 13.1 11.0* 10.1*  HCT 39.0 32.0* 29.7*  PLT 190 175 138*  MCV 97.3  97.3 96.1  MCH 32.7 33.4 32.7  MCHC 33.6 34.4 34.0  RDW 14.0 14.3 14.5  LYMPHSABS 0.8  --   --   MONOABS 1.3*  --   --   EOSABS 0.0  --   --  BASOSABS 0.0  --   --     Chemistries   Recent Labs Lab 08/16/14 1910 08/17/14 0820  NA 138 139  K 4.2 4.2  CL 101 106  CO2 28 22  GLUCOSE 120* 94  BUN 28* 20  CREATININE 1.59* 1.45*  CALCIUM 9.0 7.8*  AST 28  --   ALT 19  --   ALKPHOS 72  --   BILITOT 0.6  --    ------------------------------------------------------------------------------------------------------------------ estimated creatinine clearance is 23.9 mL/min (by C-G formula based on Cr of 1.45). ------------------------------------------------------------------------------------------------------------------ No results for input(s): HGBA1C in the last 72 hours. ------------------------------------------------------------------------------------------------------------------ No results for input(s): CHOL, HDL, LDLCALC, TRIG, CHOLHDL, LDLDIRECT in the last 72 hours. ------------------------------------------------------------------------------------------------------------------ No results for input(s): TSH, T4TOTAL, T3FREE, THYROIDAB in the last 72 hours.  Invalid input(s): FREET3 ------------------------------------------------------------------------------------------------------------------ No results for input(s): VITAMINB12, FOLATE, FERRITIN, TIBC, IRON, RETICCTPCT in the last 72 hours.  Coagulation profile No results for input(s): INR, PROTIME in the last 168 hours.  No results for input(s): DDIMER in the last 72 hours.  Cardiac Enzymes  Recent Labs Lab 08/16/14 1910  TROPONINI 0.03   ------------------------------------------------------------------------------------------------------------------ Invalid input(s): POCBNP     Time Spent in minutes   35   SINGH,PRASHANT K M.D on 08/18/2014 at 9:44 AM  Between 7am to 7pm - Pager -  [phone]  After 7pm go to [email or web address] - password [facility]  [facility]   Office  [phone]

## 2014-08-19 DIAGNOSIS — A419 Sepsis, unspecified organism: Secondary | ICD-10-CM | POA: Diagnosis not present

## 2014-08-19 LAB — CULTURE, BLOOD (ROUTINE X 2)

## 2014-08-19 MED ORDER — LEVOFLOXACIN 500 MG PO TABS: 500.0000 mg | ORAL_TABLET | ORAL | Status: DC

## 2014-08-19 MED ORDER — LEVOFLOXACIN 500 MG PO TABS: 750.0000 mg | ORAL_TABLET | Freq: Once | ORAL | Status: DC

## 2014-08-19 MED ORDER — HYDRALAZINE HCL 25 MG PO TABS
25.0000 mg | ORAL_TABLET | Freq: Two times a day (BID) | ORAL | Status: DC
  Administered 2014-08-19 – 2014-08-20 (×3): 25 mg via ORAL
  Filled 2014-08-19 (×3): qty 1

## 2014-08-19 NOTE — Progress Notes (Signed)
Patient Demographics  Judith Boone, is a 81 y.o. female, DOB - October 04, 1931, [id number]  Admit date - 08/16/2014   Admitting Physician Ivor Costa, MD  Outpatient Primary MD for the patient is Velna Hatchet, MD  LOS - 3   Chief Complaint  Patient presents with  . Leg Pain        Subjective:   Judith Boone today has, No headache, No chest pain, No abdominal pain - No Nausea, No new weakness tingling or numbness, No Cough - SOB.  L leg no pain.   Assessment & Plan    1. Sepsis due to left leg cellulitis. Wound appears stable, injury was more than 1 week ago, has posterior provided, blood cultures growing gram-negative rods, have stopped vancomycin Currently on Rocephin will switch on oral Levaquin tomorrow morning and discharge. Continue wound care. PT done and qualifies for home PT which has been ordered.    2. Essential hypertension. On home dose lisinopril and stable.    3. CKD stage III. Baseline creatinine appears to be 1.4 in 2014. This could be her new baseline. Monitor with gentle hydration.    4. GERD. On PPI.     Code Status: Full  Family Communication: none  Disposition Plan: HHPT/RN   Procedures    Lower extremity venous duplex - No evidence of DVT, superficial thrombosis, or Baker's cyst. Right: Negative for DVT in the common femoral vein.   Consults none   Medications  Scheduled Meds: . aspirin EC  81 mg Oral Q M,W,F  . cefTRIAXone (ROCEPHIN)  IV  2 g Intravenous Q24H  . heparin  5,000 Units Subcutaneous 3 times per day  . hydrALAZINE  25 mg Oral BID  . lisinopril  40 mg Oral QHS  . loratadine  10 mg Oral Daily  . pantoprazole  40 mg Oral Daily  . pneumococcal 23 valent vaccine  0.5 mL Intramuscular Tomorrow-1000  . sodium chloride  1,000 mL Intravenous  Once  . Tdap  0.5 mL Intramuscular Once  . traMADol  50 mg Oral QHS  . vitamin C  500 mg Oral Daily   Continuous Infusions:   PRN Meds:.acetaminophen  DVT Prophylaxis  Heparin    Lab Results  Component Value Date   PLT 138* 08/18/2014    Antibiotics     Anti-infectives    Start     Dose/Rate Route Frequency Ordered Stop   08/19/14 0600  cefTRIAXone (ROCEPHIN) 2 g in dextrose 5 % 50 mL IVPB - Premix     2 g 100 mL/hr over 30 Minutes Intravenous Every 24 hours 08/18/14 1057     08/17/14 1930  vancomycin (VANCOCIN) 500 mg in sodium chloride 0.9 % 100 mL IVPB  Status:  Discontinued     500 mg 100 mL/hr over 60 Minutes Intravenous Every 24 hours 08/16/14 1957 08/17/14 0035   08/17/14 0600  piperacillin-tazobactam (ZOSYN) IVPB 2.25 g  Status:  Discontinued     2.25 g 100 mL/hr over 30 Minutes Intravenous 3 times per day 08/16/14 1957 08/17/14 0035   08/17/14 0600  cefTRIAXone (ROCEPHIN) 1 g in dextrose 5 % 50 mL IVPB - Premix  Status:  Discontinued     1 g 100 mL/hr over 30 Minutes Intravenous  Every 24 hours 08/17/14 0036 08/18/14 1057   08/16/14 1915  piperacillin-tazobactam (ZOSYN) IVPB 3.375 g     3.375 g 100 mL/hr over 30 Minutes Intravenous  Once 08/16/14 1913 08/16/14 2113   08/16/14 1915  vancomycin (VANCOCIN) IVPB 1000 mg/200 mL premix     1,000 mg 200 mL/hr over 60 Minutes Intravenous  Once 08/16/14 1913 08/16/14 2047          Objective:   Filed Vitals:   08/18/14 0512 08/18/14 1530 08/18/14 2027 08/19/14 0625  BP: 149/55 128/64 143/51 166/81  Pulse: 92 88 83 89  Temp: 99.3 F (37.4 C) 98.1 F (36.7 C) 98.5 F (36.9 C) 98.9 F (37.2 C)  TempSrc:  Oral    Resp: 16 18 18 18   Height:      Weight:      SpO2: 95% 98% 97% 93%    Wt Readings from Last 3 Encounters:  08/16/14 61.236 kg (135 lb)  09/28/12 62.143 kg (137 lb)  06/22/12 62.143 kg (137 lb)     Intake/Output Summary (Last 24 hours) at 08/19/14 1056 Last data filed at 08/18/14 1720  Gross  per 24 hour  Intake    490 ml  Output      0 ml  Net    490 ml     Physical Exam  Awake Alert, Oriented X 3, No new F.N deficits, Normal affect [place].AT,PERRAL Supple Neck,No JVD, No cervical lymphadenopathy appriciated.  Symmetrical Chest wall movement, Good air movement bilaterally, CTAB RRR,No Gallops,Rubs or new Murmurs, No Parasternal Heave +ve B.Sounds, Abd Soft, No tenderness, No organomegaly appriciated, No rebound - guarding or rigidity. No Cyanosis, Clubbing or edema, No new Rash or bruise  Left leg small laceration stable, cellulitis below knee overall looks much improved   Data Review   Micro Results Recent Results (from the past 240 hour(s))  Blood Culture (routine x 2)     Status: None   Collection Time: 08/16/14  7:10 PM  Result Value Ref Range Status   Specimen Description BLOOD RIGHT AC  Final   Special Requests BOTTLES DRAWN AEROBIC AND ANAEROBIC 5CC EACH  Final   Culture   Final    ESCHERICHIA COLI Note: Gram Stain Report Called to,Read Back By and Verified With: CAROLINE MOSLEY RN 08/17/14 AT [id number] BY [facility] Performed at [employer] [facility]    Report Status 08/19/2014 FINAL  Final   Organism ID, Bacteria ESCHERICHIA COLI  Final      Susceptibility   Escherichia coli - MIC*    AMPICILLIN <=2 SENSITIVE Sensitive     AMPICILLIN/SULBACTAM <=2 SENSITIVE Sensitive     CEFAZOLIN <=4 SENSITIVE Sensitive     CEFEPIME <=1 SENSITIVE Sensitive     CEFTAZIDIME <=1 SENSITIVE Sensitive     CEFTRIAXONE <=1 SENSITIVE Sensitive     CIPROFLOXACIN <=0.25 SENSITIVE Sensitive     GENTAMICIN <=1 SENSITIVE Sensitive     IMIPENEM <=0.25 SENSITIVE Sensitive     PIP/TAZO <=4 SENSITIVE Sensitive     TOBRAMYCIN <=1 SENSITIVE Sensitive     TRIMETH/SULFA <=20 SENSITIVE Sensitive     * ESCHERICHIA COLI  Blood Culture (routine x 2)     Status: None   Collection Time: 08/16/14  7:15 PM  Result Value Ref Range Status   Specimen Description BLOOD LEFAT FA  Final   Special Requests  BOTTLES DRAWN AEROBIC AND ANAEROBIC 5CC EACH  Final   Culture   Final    ESCHERICHIA COLI Note: SUSCEPTIBILITIES PERFORMED ON PREVIOUS  CULTURE WITHIN THE LAST 5 DAYS. Note: Gram Stain Report Called to,Read Back By and Verified With: CAROLINE MOSLEY RN 08/17/14 AT 1105 AM BY [facility] Performed at [facility]    Report Status 08/19/2014 FINAL  Final  Urine culture     Status: None   Collection Time: 08/17/14 12:00 AM  Result Value Ref Range Status   Specimen Description URINE, CATHETERIZED  Final   Special Requests NONE  Final   Colony Count NO GROWTH Performed at [facility]   Final   Culture NO GROWTH Performed at [facility]   Final   Report Status 08/18/2014 FINAL  Final    Radiology Reports Dg Chest Port 1 View  08/16/2014   CLINICAL DATA:  Laceration to left lower extremity last week. Redness and pain.  EXAM: PORTABLE CHEST - 1 VIEW  COMPARISON:  01/03/2011  FINDINGS: Artifact overlies the chest. Heart size is normal. There is atherosclerosis of the aorta. There is a hiatal hernia. The lungs are clear. No effusions. No bony finding.  IMPRESSION: Aortic atherosclerosis. Hiatal hernia. No active disease otherwise.   Electronically Signed   By: Nelson Chimes M.D.   On: 08/16/2014 20:43     CBC  Recent Labs Lab 08/16/14 1910 08/17/14 0820 08/18/14 0618  WBC 12.6* 12.8* 10.3  HGB 13.1 11.0* 10.1*  HCT 39.0 32.0* 29.7*  PLT 190 175 138*  MCV 97.3 97.3 96.1  MCH 32.7 33.4 32.7  MCHC 33.6 34.4 34.0  RDW 14.0 14.3 14.5  LYMPHSABS 0.8  --   --   MONOABS 1.3*  --   --   EOSABS 0.0  --   --   BASOSABS 0.0  --   --     Chemistries   Recent Labs Lab 08/16/14 1910 08/17/14 0820  NA 138 139  K 4.2 4.2  CL 101 106  CO2 28 22  GLUCOSE 120* 94  BUN 28* 20  CREATININE 1.59* 1.45*  CALCIUM 9.0 7.8*  AST 28  --   ALT 19  --   ALKPHOS 72  --   BILITOT 0.6  --     ------------------------------------------------------------------------------------------------------------------ estimated creatinine clearance is 23.9 mL/min (by C-G formula based on Cr of 1.45). ------------------------------------------------------------------------------------------------------------------ No results for input(s): HGBA1C in the last 72 hours. ------------------------------------------------------------------------------------------------------------------ No results for input(s): CHOL, HDL, LDLCALC, TRIG, CHOLHDL, LDLDIRECT in the last 72 hours. ------------------------------------------------------------------------------------------------------------------ No results for input(s): TSH, T4TOTAL, T3FREE, THYROIDAB in the last 72 hours.  Invalid input(s): FREET3 ------------------------------------------------------------------------------------------------------------------ No results for input(s): VITAMINB12, FOLATE, FERRITIN, TIBC, IRON, RETICCTPCT in the last 72 hours.  Coagulation profile No results for input(s): INR, PROTIME in the last 168 hours.  No results for input(s): DDIMER in the last 72 hours.  Cardiac Enzymes  Recent Labs Lab 08/16/14 1910  TROPONINI 0.03   ------------------------------------------------------------------------------------------------------------------ Invalid input(s): POCBNP     Time Spent in minutes   35   SINGH,PRASHANT K M.D on 08/19/2014 at 10:56 AM  Between 7am to 7pm - Pager - [phone]  After 7pm go to [email or web address] - password [email or web address]  [facility]   Office  [phone]

## 2014-08-19 NOTE — Progress Notes (Signed)
ANTIBIOTIC CONSULT NOTE - INITIAL  Pharmacy Consult for Levaquin Indication: Wound infection  Allergies  Allergen Reactions  . Bacitracin     unknown  . Neosporin [Neomycin-Bacitracin Zn-Polymyx]     unknown  . Polysporin [Bacitracin-Polymyxin B]     unknown    Assessment: 81 y/o F lacerated her leg on a car door last week, admitted for sepsis and wound infection. On D#3 rocephin. Wound appears stable. Plan for transition to PO levaquin and discharge tomorrow. Afebrile, wbc down to 10.3. CKD stage 3, Scr 1.45 on 4/21 with est. crcl ~ 20-25 ml/min.  Rocephin 4/21 >> 4/23 Levaquin 4/24 >> Vanc/zosyn 4/20 x 1   4/20 Blood x 2 - 2/2 E.coli (pan sens) 4/21 Urine - neg  Plan:  - D/c rocephin tomorrow - Then start levaquin 750 mg po x 1 followed by 500 mg Q 48 hrs - Pharmacy sign off  Thanks! Maryanna Shape, PharmD, BCPS  Clinical Pharmacist  Pager: [phone]   08/19/2014,1:16 PM

## 2014-08-20 MED ORDER — SACCHAROMYCES BOULARDII 250 MG PO CAPS: 250.0000 mg | ORAL_CAPSULE | Freq: Two times a day (BID) | ORAL | Status: DC

## 2014-08-20 MED ORDER — LEVOFLOXACIN 500 MG PO TABS: 500.0000 mg | ORAL_TABLET | ORAL | Status: DC

## 2014-08-20 NOTE — Discharge Instructions (Signed)
Follow with Primary MD Judith Hatchet, MD in 7 days   Get CBC, CMP, 2 view Chest X ray checked  by Primary MD next visit.    Activity: As tolerated with Full fall precautions use walker/cane & assistance as needed   Disposition Home    Diet: Heart Healthy    For Heart failure patients - Check your Weight same time everyday, if you gain over 2 pounds, or you develop in leg swelling, experience more shortness of breath or chest pain, call your Primary MD immediately. Follow Cardiac Low Salt Diet and 1.5 lit/day fluid restriction.   On your next visit with your primary care physician please Get Medicines reviewed and adjusted.   Please request your Prim.MD to go over all Hospital Tests and Procedure/Radiological results at the follow up, please get all Hospital records sent to your Prim MD by signing hospital release before you go home.   If you experience worsening of your admission symptoms, develop shortness of breath, life threatening emergency, suicidal or homicidal thoughts you must seek medical attention immediately by calling 911 or calling your MD immediately  if symptoms less severe.  You Must read complete instructions/literature along with all the possible adverse reactions/side effects for all the Medicines you take and that have been prescribed to you. Take any new Medicines after you have completely understood and accpet all the possible adverse reactions/side effects.   Do not drive, operating heavy machinery, perform activities at heights, swimming or participation in water activities or provide baby sitting services if your were admitted for syncope or siezures until you have seen by Primary MD or a Neurologist and advised to do so again.  Do not drive when taking Pain medications.    Do not take more than prescribed Pain, Sleep and Anxiety Medications  Special Instructions: If you have smoked or chewed Tobacco  in the last 2 yrs please stop smoking, stop any regular  Alcohol  and or any Recreational drug use.  Wear Seat belts while driving.   Please note  You were cared for by a hospitalist during your hospital stay. If you have any questions about your discharge medications or the care you received while you were in the hospital after you are discharged, you can call the unit and asked to speak with the hospitalist on call if the hospitalist that took care of you is not available. Once you are discharged, your primary care physician will handle any further medical issues. Please note that NO REFILLS for any discharge medications will be authorized once you are discharged, as it is imperative that you return to your primary care physician (or establish a relationship with a primary care physician if you do not have one) for your aftercare needs so that they can reassess your need for medications and monitor your lab values.

## 2014-08-20 NOTE — Discharge Summary (Signed)
Boone Judith, is a 81 y.o. female  DOB 02-Jul-1931  MRN [id number].  Admission date:  08/16/2014  Admitting Physician  Ivor Costa, MD  Discharge Date:  08/20/2014   Primary MD  Velna Hatchet, MD  Recommendations for primary care physician for things to follow:   Monitor left leg cellulitis and laceration closely.  Check CBC BMP in a week.   Admission Diagnosis  Cellulitis of left lower extremity [L03.116]   Discharge Diagnosis  Cellulitis of left lower extremity [L03.116]     Principal Problem:   Cellulitis of leg, left Active Problems:   CKD (chronic kidney disease) stage 3, GFR 30-59 ml/min   Sepsis      Past Medical History  Diagnosis Date  . Arthritis   . GERD (gastroesophageal reflux disease)   . Hypertension     Past Surgical History  Procedure Laterality Date  . Cataract extraction    . Broken ankle repair      left  . Total knee arthroplasty      right  . Abdominal hysterectomy         History of present illness and  Hospital Course:     Kindly see H&P for history of present illness and admission details, please review complete Labs, Consult reports and Test reports for all details in brief  HPI  from the history and physical done on the day of admission  Judith Boone is a 81 y.o. female who presents to the ED with left lower leg and shin pain, redness, swelling, fever. Symptoms onset 1 week ago when she cut her leg on a car door, bled a lot at that time, but over the past 1 day has gotten red, puffy, and she has developed fever and chills. No h/o MRSA, no history of skin infections.  Hospital Course    1. Sepsis and Escherichia coli gram-negative bacteremia due to left leg cellulitis. Wound appears stable and much improved, light is almost completely resolved on IV Rocephin, he  be placed on 10 more days of oral Levaquin dosed by pharmacy and discharged home, seen by PT cleared for home discharge. Patient requested to keep left leg clean and dry. She was provided with tetanus toxoid booster dose this admission.      2. Essential hypertension. On home dose lisinopril and stable.    3. CKD stage III. Baseline creatinine appears to be 1.4 in 2014. This could be her new baseline. [facility] P[facility] to recheck BMP in a week.    4. GERD. On PPI.    Discharge Condition: Stable   Follow UP  Follow-up Information    Follow up with [facility].   Why:  Someone from [facility] will contact you concerning start date and time for therapy.   Contact information:   [place] [id number] [phone]       Follow up with Velna Hatchet, MD. Schedule an appointment as soon as possible for a visit in 3 days.  Specialty:  Internal Medicine   Contact information:   [address] [place] [address] [phone]         Discharge Instructions  and  Discharge Medications      Discharge Instructions    Diet - low sodium heart healthy    Complete by:  As directed      Discharge instructions    Complete by:  As directed   Follow with Primary MD Velna Hatchet, MD in 7 days   Get CBC, CMP, 2 view Chest X ray checked  by Primary MD next visit.    Activity: As tolerated with Full fall precautions use walker/cane & assistance as needed   Disposition Home    Diet: Heart Healthy    For Heart failure patients - Check your Weight same time everyday, if you gain over 2 pounds, or you develop in leg swelling, experience more shortness of breath or chest pain, call your Primary MD immediately. Follow Cardiac Low Salt Diet and 1.5 lit/day fluid restriction.   On your next visit with your primary care physician please Get Medicines reviewed and adjusted.   Please request your Prim.MD to go over all Hospital Tests and  Procedure/Radiological results at the follow up, please get all Hospital records sent to your Prim MD by signing hospital release before you go home.   If you experience worsening of your admission symptoms, develop shortness of breath, life threatening emergency, suicidal or homicidal thoughts you must seek medical attention immediately by calling 911 or calling your MD immediately  if symptoms less severe.  You Must read complete instructions/literature along with all the possible adverse reactions/side effects for all the Medicines you take and that have been prescribed to you. Take any new Medicines after you have completely understood and accpet all the possible adverse reactions/side effects.   Do not drive, operating heavy machinery, perform activities at heights, swimming or participation in water activities or provide baby sitting services if your were admitted for syncope or siezures until you have seen by Primary MD or a Neurologist and advised to do so again.  Do not drive when taking Pain medications.    Do not take more than prescribed Pain, Sleep and Anxiety Medications  Special Instructions: If you have smoked or chewed Tobacco  in the last 2 yrs please stop smoking, stop any regular Alcohol  and or any Recreational drug use.  Wear Seat belts while driving.   Please note  You were cared for by a hospitalist during your hospital stay. If you have any questions about your discharge medications or the care you received while you were in the hospital after you are discharged, you can call the unit and asked to speak with the hospitalist on call if the hospitalist that took care of you is not available. Once you are discharged, your primary care physician will handle any further medical issues. Please note that NO REFILLS for any discharge medications will be authorized once you are discharged, as it is imperative that you return to your primary care physician (or establish a  relationship with a primary care physician if you do not have one) for your aftercare needs so that they can reassess your need for medications and monitor your lab values.     Increase activity slowly    Complete by:  As directed             Medication List    TAKE these medications  aspirin 81 MG tablet  Take 81 mg by mouth 3 (three) times a week. No specific day     calcium carbonate 600 MG Tabs tablet  Commonly known as:  OS-CAL  Take 600 mg by mouth 2 (two) times daily with a meal.     CRESTOR 5 MG tablet  Generic drug:  rosuvastatin  TAKE 1 TALET TWICE A WEEK     [employer]S SPECTRAVITE PO  Take 1 tablet by mouth daily.     hydrALAZINE 25 MG tablet  Commonly known as:  APRESOLINE  Take 25 mg by mouth 2 (two) times daily.     levofloxacin 500 MG tablet  Commonly known as:  LEVAQUIN  Take 1 tablet (500 mg total) by mouth every other day.  Start taking on:  08/22/2014     loratadine 10 MG tablet  Commonly known as:  CLARITIN  Take 10 mg by mouth daily.     omeprazole 20 MG capsule  Commonly known as:  PRILOSEC     quinapril 40 MG tablet  Commonly known as:  ACCUPRIL  Take 40 mg by mouth at bedtime.     saccharomyces boulardii 250 MG capsule  Commonly known as:  FLORASTOR  Take 1 capsule (250 mg total) by mouth 2 (two) times daily.     traMADol 50 MG tablet  Commonly known as:  ULTRAM  Take 50 mg by mouth at bedtime.     vitamin C 500 MG tablet  Commonly known as:  ASCORBIC ACID  Take 500 mg by mouth daily. 2 per day     Vitamin D3 1000 UNITS Caps  Take 1 capsule by mouth 2 (two) times daily. 2 per day          Diet and Activity recommendation: See Discharge Instructions above   Consults obtained - none   Major procedures and Radiology Reports - PLEASE review detailed and final reports for all details, in brief -    Leg venous ultrasound  - No obvious evidence of deep vein or superficial thrombosis involving the left lower extremity and  right common femoral vein. An enlarged lymph node is noted in the left inguinal crease. - No evidence of Baker&'s cyst on the left.   Dg Chest Port 1 View  08/16/2014   CLINICAL DATA:  Laceration to left lower extremity last week. Redness and pain.  EXAM: PORTABLE CHEST - 1 VIEW  COMPARISON:  01/03/2011  FINDINGS: Artifact overlies the chest. Heart size is normal. There is atherosclerosis of the aorta. There is a hiatal hernia. The lungs are clear. No effusions. No bony finding.  IMPRESSION: Aortic atherosclerosis. Hiatal hernia. No active disease otherwise.   Electronically Signed   By: Nelson Chimes M.D.   On: 08/16/2014 20:43    Micro Results      Recent Results (from the past 240 hour(s))  Blood Culture (routine x 2)     Status: None   Collection Time: 08/16/14  7:10 PM  Result Value Ref Range Status   Specimen Description BLOOD RIGHT [facility]  Final   Special Requests BOTTLES DRAWN AEROBIC AND ANAEROBIC 5CC EACH  Final   Culture   Final    ESCHERICHIA COLI Note: Gram Stain Report Called to,Read Back By and Verified With: Haze Boyden RN 08/17/14 AT [id number] BY [facility] Performed at [facility]    Report Status 08/19/2014 FINAL  Final   Organism ID, Bacteria ESCHERICHIA COLI  Final      Susceptibility  Escherichia coli - MIC*    AMPICILLIN <=2 SENSITIVE Sensitive     AMPICILLIN/SULBACTAM <=2 SENSITIVE Sensitive     CEFAZOLIN <=4 SENSITIVE Sensitive     CEFEPIME <=1 SENSITIVE Sensitive     CEFTAZIDIME <=1 SENSITIVE Sensitive     CEFTRIAXONE <=1 SENSITIVE Sensitive     CIPROFLOXACIN <=0.25 SENSITIVE Sensitive     GENTAMICIN <=1 SENSITIVE Sensitive     IMIPENEM <=0.25 SENSITIVE Sensitive     PIP/TAZO <=4 SENSITIVE Sensitive     TOBRAMYCIN <=1 SENSITIVE Sensitive     TRIMETH/SULFA <=20 SENSITIVE Sensitive     * ESCHERICHIA COLI  Blood Culture (routine x 2)     Status: None   Collection Time: 08/16/14  7:15 PM  Result Value Ref Range Status   Specimen Description BLOOD  LEFAT FA  Final   Special Requests BOTTLES DRAWN AEROBIC AND ANAEROBIC 5CC EACH  Final   Culture   Final    ESCHERICHIA COLI Note: SUSCEPTIBILITIES PERFORMED ON PREVIOUS CULTURE WITHIN THE LAST 5 DAYS. Note: Gram Stain Report Called to,Read Back By and Verified With: CAROLINE MOSLEY RN 08/17/14 AT [id number] AM BY [facility] Performed at [facility]    Report Status 08/19/2014 FINAL  Final  Urine culture     Status: None   Collection Time: 08/17/14 12:00 AM  Result Value Ref Range Status   Specimen Description URINE, CATHETERIZED  Final   Special Requests NONE  Final   Colony Count NO GROWTH Performed at [facility]   Final   Culture NO GROWTH Performed at [facility]   Final   Report Status 08/18/2014 FINAL  Final       Today   Subjective:   Ladoris Gene today has no headache,no chest abdominal pain,no new weakness tingling or numbness, feels much better wants to go home today.   Objective:   Blood pressure 132/57, pulse 90, temperature 97.6 F (36.4 C), temperature source Oral, resp. rate 18, height 4\' 10"  (1.473 m), weight 61.236 kg (135 lb), SpO2 96 %.   Intake/Output Summary (Last 24 hours) at 08/20/14 1005 Last data filed at 08/20/14 0800  Gross per 24 hour  Intake    720 ml  Output      0 ml  Net    720 ml    Exam Awake Alert, Oriented x 3, No new F.N deficits, Normal affect [place].AT,PERRAL Supple Neck,No JVD, No cervical lymphadenopathy appriciated.  Symmetrical Chest wall movement, Good air movement bilaterally, CTAB RRR,No Gallops,Rubs or new Murmurs, No Parasternal Heave +ve B.Sounds, Abd Soft, Non tender, No organomegaly appriciated, No rebound -guarding or rigidity. No Cyanosis, Clubbing or edema, No new Rash or bruise, L.Leg cellulitis almost completely resolved. Laceration is healing well  Data Review   CBC w Diff: Lab Results  Component Value Date   WBC 10.3 08/18/2014   HGB 10.1* 08/18/2014   HCT 29.7* 08/18/2014   PLT 138*  08/18/2014   LYMPHOPCT 6* 08/16/2014   MONOPCT 10 08/16/2014   EOSPCT 0 08/16/2014   BASOPCT 0 08/16/2014    CMP: Lab Results  Component Value Date   NA 139 08/17/2014   K 4.2 08/17/2014   CL 106 08/17/2014   CO2 22 08/17/2014   BUN 20 08/17/2014   CREATININE 1.45* 08/17/2014   CREATININE 1.17* 09/21/2012   PROT 7.1 08/16/2014   ALBUMIN 4.2 08/16/2014   BILITOT 0.6 08/16/2014   ALKPHOS 72 08/16/2014   AST 28 08/16/2014   ALT 19 08/16/2014  .  Total Time in preparing paper work, data evaluation and todays exam - 35 minutes  Thurnell Lose M.D on 08/20/2014 at 10:05 AM  [facility]   Office  [phone]

## 2014-08-25 DIAGNOSIS — D649 Anemia, unspecified: Secondary | ICD-10-CM | POA: Diagnosis not present

## 2014-09-13 DIAGNOSIS — R600 Localized edema: Secondary | ICD-10-CM | POA: Diagnosis not present

## 2014-09-13 DIAGNOSIS — Z6828 Body mass index (BMI) 28.0-28.9, adult: Secondary | ICD-10-CM | POA: Diagnosis not present

## 2014-09-14 ENCOUNTER — Ambulatory Visit (HOSPITAL_COMMUNITY)
Admission: RE | Admit: 2014-09-14 | Discharge: 2014-09-14 | Disposition: A | Discharge status: Home / Self Care | Payer: Medicare Other | Source: Ambulatory Visit | Attending: Vascular Surgery | Admitting: Vascular Surgery

## 2014-09-14 ENCOUNTER — Other Ambulatory Visit (HOSPITAL_COMMUNITY): Payer: Self-pay | Admitting: Adult Health

## 2014-09-14 DIAGNOSIS — I743 Embolism and thrombosis of arteries of the lower extremities: Secondary | ICD-10-CM | POA: Diagnosis not present

## 2014-09-14 DIAGNOSIS — I82542 Chronic embolism and thrombosis of left tibial vein: Secondary | ICD-10-CM | POA: Diagnosis not present

## 2014-09-14 DIAGNOSIS — M549 Dorsalgia, unspecified: Secondary | ICD-10-CM | POA: Diagnosis not present

## 2014-09-14 DIAGNOSIS — R600 Localized edema: Secondary | ICD-10-CM

## 2014-09-14 DIAGNOSIS — I82402 Acute embolism and thrombosis of unspecified deep veins of left lower extremity: Secondary | ICD-10-CM | POA: Diagnosis not present

## 2014-09-18 DIAGNOSIS — D696 Thrombocytopenia, unspecified: Secondary | ICD-10-CM | POA: Diagnosis not present

## 2014-09-18 DIAGNOSIS — I10 Essential (primary) hypertension: Secondary | ICD-10-CM | POA: Diagnosis not present

## 2014-09-18 DIAGNOSIS — L03116 Cellulitis of left lower limb: Secondary | ICD-10-CM | POA: Diagnosis not present

## 2014-09-18 DIAGNOSIS — D649 Anemia, unspecified: Secondary | ICD-10-CM | POA: Diagnosis not present

## 2014-09-18 DIAGNOSIS — Z6827 Body mass index (BMI) 27.0-27.9, adult: Secondary | ICD-10-CM | POA: Diagnosis not present

## 2014-09-22 ENCOUNTER — Encounter: Payer: Self-pay | Admitting: Vascular Surgery

## 2014-10-02 DIAGNOSIS — H52203 Unspecified astigmatism, bilateral: Secondary | ICD-10-CM | POA: Diagnosis not present

## 2014-10-02 DIAGNOSIS — H35363 Drusen (degenerative) of macula, bilateral: Secondary | ICD-10-CM | POA: Diagnosis not present

## 2014-10-02 DIAGNOSIS — H43813 Vitreous degeneration, bilateral: Secondary | ICD-10-CM | POA: Diagnosis not present

## 2014-10-02 DIAGNOSIS — Z961 Presence of intraocular lens: Secondary | ICD-10-CM | POA: Diagnosis not present

## 2014-10-10 DIAGNOSIS — M5126 Other intervertebral disc displacement, lumbar region: Secondary | ICD-10-CM | POA: Diagnosis not present

## 2014-10-10 DIAGNOSIS — M5418 Radiculopathy, sacral and sacrococcygeal region: Secondary | ICD-10-CM | POA: Diagnosis not present

## 2014-10-12 ENCOUNTER — Other Ambulatory Visit: Payer: Self-pay | Admitting: *Deleted

## 2014-10-12 DIAGNOSIS — M7989 Other specified soft tissue disorders: Secondary | ICD-10-CM

## 2014-10-12 DIAGNOSIS — I7092 Chronic total occlusion of artery of the extremities: Secondary | ICD-10-CM

## 2014-10-13 ENCOUNTER — Encounter: Payer: Self-pay | Admitting: Vascular Surgery

## 2014-10-16 ENCOUNTER — Encounter (HOSPITAL_COMMUNITY): Payer: Medicare Other

## 2014-10-16 ENCOUNTER — Encounter: Payer: Medicare Other | Admitting: Surgery

## 2014-10-17 ENCOUNTER — Encounter: Payer: Self-pay | Admitting: Vascular Surgery

## 2014-10-17 ENCOUNTER — Ambulatory Visit (INDEPENDENT_AMBULATORY_CARE_PROVIDER_SITE_OTHER): Payer: Medicare Other | Admitting: Vascular Surgery

## 2014-10-17 ENCOUNTER — Ambulatory Visit (HOSPITAL_COMMUNITY)
Admission: RE | Admit: 2014-10-17 | Discharge: 2014-10-17 | Disposition: A | Discharge status: Home / Self Care | Payer: Medicare Other | Source: Ambulatory Visit | Attending: Vascular Surgery | Admitting: Vascular Surgery

## 2014-10-17 VITALS — BP 173/86 | HR 83 | Resp 16 | Ht <= 58 in | Wt 144.0 lb

## 2014-10-17 DIAGNOSIS — I7092 Chronic total occlusion of artery of the extremities: Secondary | ICD-10-CM | POA: Diagnosis not present

## 2014-10-17 DIAGNOSIS — I82402 Acute embolism and thrombosis of unspecified deep veins of left lower extremity: Secondary | ICD-10-CM | POA: Diagnosis not present

## 2014-10-17 DIAGNOSIS — M7989 Other specified soft tissue disorders: Secondary | ICD-10-CM

## 2014-10-17 NOTE — Progress Notes (Signed)
Patient name: Judith Boone MRN: [id number] DOB: [birth year] Sex: female   Referred by: [place]  Reason for referral:  Chief Complaint  Patient presents with  . New Evaluation    PAD  occluded SFA    HISTORY OF PRESENT ILLNESS: The patient is a very nice 81 year old female who presents today for evaluation of venous and arterial insufficiency. She had that episode of skin tear resulting from a scrape on her left pretibial area several months ago. This became infected she develops severe cellulitis and was hospitalized for IV antibodies. This eventually resolved and she is back to her usual baseline health. She has no significant cardiac history. No history of prior professor occlusive disease and no history of DVT. She denies any claudication type symptoms and does not have rest pain. Aside from the issue of cellulitis she has never had any issue of tissue loss.  Past Medical History  Diagnosis Date  . Arthritis   . GERD (gastroesophageal reflux disease)   . Hypertension   . Left leg swelling   . Hyperlipidemia   . COPD (chronic obstructive pulmonary disease)   . Allergy     allergic rhinitis  . Right-sided low back pain with sciatica   . History of granulomatous disease   . Cellulitis of left leg 07-2014    hospitalized for 4 days    Past Surgical History  Procedure Laterality Date  . Cataract extraction    . Broken ankle repair      left  . Total knee arthroplasty      right  . Abdominal hysterectomy      History   Social History  . Marital Status: Widowed    Spouse Name: N/A  . Number of Children: N/A  . Years of Education: N/A   Occupational History  . Not on file.   Social History Main Topics  . Smoking status: Former Smoker    Quit date: 04/29/1979  . Smokeless tobacco: Never Used  . Alcohol Use: 0.0 oz/week    0 Standard drinks or equivalent per week  . Drug Use: No  . Sexual Activity: Not on file   Other Topics Concern  . Not on file    Social History Narrative    Family History  Problem Relation Age of Onset  . Alcohol abuse Father   . Heart disease Father   . Hypertension Father   . Hypertension Daughter   . Hypertension Son     Allergies as of 10/17/2014 - Review Complete 10/17/2014  Allergen Reaction Noted  . Bacitracin  08/16/2014  . Neosporin [neomycin-bacitracin zn-polymyx]  08/16/2014  . Polysporin [bacitracin-polymyxin b]  08/16/2014    Current Outpatient Prescriptions on File Prior to Visit  Medication Sig Dispense Refill  . Ascorbic Acid (VITAMIN C) 500 MG tablet Take 500 mg by mouth daily. 2 per day     . calcium carbonate (OS-CAL) 600 MG TABS Take 600 mg by mouth 2 (two) times daily with a meal.      . Cholecalciferol (VITAMIN D3) 1000 UNITS CAPS Take 1 capsule by mouth 2 (two) times daily. 2 per day    . CRESTOR 5 MG tablet TAKE 1 TALET TWICE A WEEK 24 tablet 0  . hydrALAZINE (APRESOLINE) 25 MG tablet Take 25 mg by mouth 2 (two) times daily.     Marland Kitchen loratadine (CLARITIN) 10 MG tablet Take 10 mg by mouth daily.    . Multiple Vitamins-Minerals (CVS SPECTRAVITE PO) Take 1 tablet  by mouth daily.     Marland Kitchen omeprazole (PRILOSEC) 20 MG capsule     . quinapril (ACCUPRIL) 40 MG tablet Take 40 mg by mouth at bedtime.    . traMADol (ULTRAM) 50 MG tablet Take 50 mg by mouth at bedtime.    Marland Kitchen aspirin 81 MG tablet Take 81 mg by mouth 3 (three) times a week. No specific day    . levofloxacin (LEVAQUIN) 500 MG tablet Take 1 tablet (500 mg total) by mouth every other day. (Patient not taking: Reported on 10/17/2014) 5 tablet 0  . saccharomyces boulardii (FLORASTOR) 250 MG capsule Take 1 capsule (250 mg total) by mouth 2 (two) times daily. (Patient not taking: Reported on 10/17/2014) 30 capsule 0   No current facility-administered medications on file prior to visit.     REVIEW OF SYSTEMS:  Positives indicated with an "X"  CARDIOVASCULAR:  [ ]  chest pain   [ ]  chest pressure   [ ]  palpitations   [ ]  orthopnea   [x  ] dyspnea on exertion   [ ]  claudication   [ ]  rest pain   [x ] DVT   [ ]  phlebitis PULMONARY:   [ ]  productive cough   [ ]  asthma   [ ]  wheezing NEUROLOGIC:   [ ]  weakness  [ ]  paresthesias  [ ]  aphasia  [ ]  amaurosis  [ ]  dizziness HEMATOLOGIC:   [ ]  bleeding problems   [ ]  clotting disorders MUSCULOSKELETAL:  [ ]  joint pain   [ ]  joint swelling GASTROINTESTINAL: [ ]   blood in stool  [ ]   hematemesis GENITOURINARY:  [ ]   dysuria  [ ]   hematuria PSYCHIATRIC:  [ ]  history of major depression INTEGUMENTARY:  [ ]  rashes  [ ]  ulcers CONSTITUTIONAL:  [ ]  fever   [ ]  chills  PHYSICAL EXAMINATION:  General: The patient is a well-nourished female, in no acute distress. Vital signs are BP 173/86 mmHg  Pulse 83  Resp 16  Ht 4\' 9"  (1.448 m)  Wt 144 lb (65.318 kg)  BMI 31.15 kg/m2 Pulmonary: There is a good air exchange  Abdomen: Soft and non-tender  Musculoskeletal: There are no major deformities.  There is no significant extremity pain. Neurologic: No focal weakness or paresthesias are detected, Skin: There are no ulcer or rashes noted. She does have some discoloration from chronic Bruising in both lower extremities. She does have a well-healed skin tear on the left pretibial area Psychiatric: The patient has normal affect. Cardiovascular: There is a regular rate and rhythm without significant murmur appreciated. Pulse status 2+ radial pulses bilaterally. She has 2+ femoral pulses bilaterally she does have a 2+ right dorsalis pedis and absent left dorsalis pedis pulse  VVS Vascular Lab Studies:  Ordered and Independently Reviewed venous duplex from 09/14/2014 was reviewed. 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 She does have a venous reflux in her left leg Arterial duplex from 01/06/2014 revealed slightly diminished ankle arm index 0.87 on the left and normal on the right.  Impression and Plan:  Had long discussion with patient. I explained that her  tibial vein thrombus on the left could well be chronic and unrelated to her event with the skin tear and cellulitis resulting from this. Do not see any vascular reason to continue with oral anticoagulation. Would feel comfortable stopping all anticoagulation. Also discussed her left superficial femoral artery occlusion and the excellent collateralization she has. She clearly has shown that  she had adequate flow to heal the episode that she had several months ago with cellulitis in her left leg. She is not having any claudication symptoms or other symptoms of arterial disease. Would not recommend any additional workup. He knows to notify should she develop any tissue loss on the left. She was reassured this discussion will see [place] again on as-needed basis    Natnael Biederman [facility] Office: [phone]

## 2014-10-17 NOTE — Progress Notes (Signed)
Filed Vitals:   10/17/14 1057 10/17/14 1104  BP: 181/91 173/86  Pulse: 84 83  Resp: 16   Height: 4\' 9"  (1.448 m)   Weight: 144 lb (65.318 kg)    Body mass index is 31.15 kg/(m^2).

## 2014-11-08 DIAGNOSIS — M545 Low back pain: Secondary | ICD-10-CM | POA: Diagnosis not present

## 2014-11-08 DIAGNOSIS — M5126 Other intervertebral disc displacement, lumbar region: Secondary | ICD-10-CM | POA: Diagnosis not present

## 2014-11-08 DIAGNOSIS — M5418 Radiculopathy, sacral and sacrococcygeal region: Secondary | ICD-10-CM | POA: Diagnosis not present

## 2014-12-04 DIAGNOSIS — M5418 Radiculopathy, sacral and sacrococcygeal region: Secondary | ICD-10-CM | POA: Diagnosis not present

## 2014-12-04 DIAGNOSIS — M5126 Other intervertebral disc displacement, lumbar region: Secondary | ICD-10-CM | POA: Diagnosis not present

## 2014-12-19 DIAGNOSIS — E785 Hyperlipidemia, unspecified: Secondary | ICD-10-CM | POA: Diagnosis not present

## 2014-12-19 DIAGNOSIS — I739 Peripheral vascular disease, unspecified: Secondary | ICD-10-CM | POA: Diagnosis not present

## 2014-12-19 DIAGNOSIS — I2510 Atherosclerotic heart disease of native coronary artery without angina pectoris: Secondary | ICD-10-CM | POA: Diagnosis not present

## 2014-12-22 DIAGNOSIS — Z85828 Personal history of other malignant neoplasm of skin: Secondary | ICD-10-CM | POA: Diagnosis not present

## 2014-12-22 DIAGNOSIS — L821 Other seborrheic keratosis: Secondary | ICD-10-CM | POA: Diagnosis not present

## 2014-12-26 DIAGNOSIS — M1990 Unspecified osteoarthritis, unspecified site: Secondary | ICD-10-CM | POA: Diagnosis not present

## 2014-12-26 DIAGNOSIS — E785 Hyperlipidemia, unspecified: Secondary | ICD-10-CM | POA: Diagnosis not present

## 2014-12-26 DIAGNOSIS — I129 Hypertensive chronic kidney disease with stage 1 through stage 4 chronic kidney disease, or unspecified chronic kidney disease: Secondary | ICD-10-CM | POA: Diagnosis not present

## 2014-12-26 DIAGNOSIS — I10 Essential (primary) hypertension: Secondary | ICD-10-CM | POA: Diagnosis not present

## 2014-12-26 DIAGNOSIS — I739 Peripheral vascular disease, unspecified: Secondary | ICD-10-CM | POA: Diagnosis not present

## 2014-12-26 DIAGNOSIS — I8291 Chronic embolism and thrombosis of unspecified vein: Secondary | ICD-10-CM | POA: Diagnosis not present

## 2014-12-26 DIAGNOSIS — G2581 Restless legs syndrome: Secondary | ICD-10-CM | POA: Diagnosis not present

## 2014-12-26 DIAGNOSIS — Z23 Encounter for immunization: Secondary | ICD-10-CM | POA: Diagnosis not present

## 2014-12-26 DIAGNOSIS — Z1389 Encounter for screening for other disorder: Secondary | ICD-10-CM | POA: Diagnosis not present

## 2014-12-26 DIAGNOSIS — N183 Chronic kidney disease, stage 3 (moderate): Secondary | ICD-10-CM | POA: Diagnosis not present

## 2014-12-26 DIAGNOSIS — Z0000 Encounter for general adult medical examination without abnormal findings: Secondary | ICD-10-CM | POA: Diagnosis not present

## 2014-12-26 DIAGNOSIS — Z6828 Body mass index (BMI) 28.0-28.9, adult: Secondary | ICD-10-CM | POA: Diagnosis not present

## 2014-12-27 DIAGNOSIS — M5416 Radiculopathy, lumbar region: Secondary | ICD-10-CM | POA: Diagnosis not present

## 2014-12-27 DIAGNOSIS — Z6828 Body mass index (BMI) 28.0-28.9, adult: Secondary | ICD-10-CM | POA: Diagnosis not present

## 2014-12-27 DIAGNOSIS — M5126 Other intervertebral disc displacement, lumbar region: Secondary | ICD-10-CM | POA: Diagnosis not present

## 2014-12-27 DIAGNOSIS — M5418 Radiculopathy, sacral and sacrococcygeal region: Secondary | ICD-10-CM | POA: Diagnosis not present

## 2014-12-27 DIAGNOSIS — M545 Low back pain: Secondary | ICD-10-CM | POA: Diagnosis not present

## 2014-12-27 DIAGNOSIS — I10 Essential (primary) hypertension: Secondary | ICD-10-CM | POA: Diagnosis not present

## 2014-12-27 DIAGNOSIS — M4806 Spinal stenosis, lumbar region: Secondary | ICD-10-CM | POA: Diagnosis not present

## 2015-01-02 DIAGNOSIS — Z1212 Encounter for screening for malignant neoplasm of rectum: Secondary | ICD-10-CM | POA: Diagnosis not present

## 2015-02-27 DIAGNOSIS — M5418 Radiculopathy, sacral and sacrococcygeal region: Secondary | ICD-10-CM | POA: Diagnosis not present

## 2015-02-27 DIAGNOSIS — M4806 Spinal stenosis, lumbar region: Secondary | ICD-10-CM | POA: Diagnosis not present

## 2015-04-11 DIAGNOSIS — M5126 Other intervertebral disc displacement, lumbar region: Secondary | ICD-10-CM | POA: Diagnosis not present

## 2015-04-11 DIAGNOSIS — M4806 Spinal stenosis, lumbar region: Secondary | ICD-10-CM | POA: Diagnosis not present

## 2015-04-11 DIAGNOSIS — M5416 Radiculopathy, lumbar region: Secondary | ICD-10-CM | POA: Diagnosis not present

## 2015-04-11 DIAGNOSIS — M5418 Radiculopathy, sacral and sacrococcygeal region: Secondary | ICD-10-CM | POA: Diagnosis not present

## 2015-04-11 DIAGNOSIS — M545 Low back pain: Secondary | ICD-10-CM | POA: Diagnosis not present

## 2015-04-17 ENCOUNTER — Ambulatory Visit (INDEPENDENT_AMBULATORY_CARE_PROVIDER_SITE_OTHER): Payer: Medicare Other

## 2015-04-17 ENCOUNTER — Encounter: Payer: Self-pay | Admitting: Podiatry

## 2015-04-17 ENCOUNTER — Ambulatory Visit (INDEPENDENT_AMBULATORY_CARE_PROVIDER_SITE_OTHER): Payer: Medicare Other | Admitting: Podiatry

## 2015-04-17 VITALS — BP 147/70 | HR 98 | Resp 16

## 2015-04-17 DIAGNOSIS — L603 Nail dystrophy: Secondary | ICD-10-CM

## 2015-04-17 DIAGNOSIS — I7092 Chronic total occlusion of artery of the extremities: Secondary | ICD-10-CM

## 2015-04-17 DIAGNOSIS — M2041 Other hammer toe(s) (acquired), right foot: Secondary | ICD-10-CM

## 2015-04-17 NOTE — Progress Notes (Signed)
   Subjective:    Patient ID: Judith Boone, female    DOB: 06-21-1931, 81 y.o.   MRN: [id number]  HPI : she presents with a chief complaint of painful toes second third digits of the right foot. She's also complaining of a painful toenail hallux right. She states that now that she wears compression hose and really seems to bother these 2 toes the most. She states that it hurts when she is walking and she must walk for exercise. She states that the majority of the pain is the dorsal aspect of the toes themselves. She denies pain to the plantar aspect of the forefoot right. She thinks that damage to the nailplate developed when she dropped an item on the toe several years ago.    Review of Systems  HENT: Positive for hearing loss.   Cardiovascular: Positive for leg swelling.  Musculoskeletal: Positive for back pain and arthralgias.  All other systems reviewed and are negative.      Objective:   Physical Exam : 81 year old white female presents in no apparent distress pulses are strongly palpable neurologic sensorium is intact. Orthopedic evaluation demonstrates rigid hammertoe deformities #2 and #3 of the right foot with skin tightly drawn over the toes with reactive hyperkeratosis overlying the PIPJ's. Hallux nail plate right does demonstrate a distal  Onycholysis.       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 Nail dystrophy hallux right.  Plan: recommended silicone digital  Condoms the 2 toes right. And I debrided the nail. I will follow-up with her as needed.

## 2015-04-27 DIAGNOSIS — I10 Essential (primary) hypertension: Secondary | ICD-10-CM | POA: Diagnosis not present

## 2015-04-27 DIAGNOSIS — E784 Other hyperlipidemia: Secondary | ICD-10-CM | POA: Diagnosis not present

## 2015-04-27 DIAGNOSIS — K219 Gastro-esophageal reflux disease without esophagitis: Secondary | ICD-10-CM | POA: Diagnosis not present

## 2015-04-27 DIAGNOSIS — Z6829 Body mass index (BMI) 29.0-29.9, adult: Secondary | ICD-10-CM | POA: Diagnosis not present

## 2015-04-27 DIAGNOSIS — E785 Hyperlipidemia, unspecified: Secondary | ICD-10-CM | POA: Diagnosis not present

## 2015-07-26 DIAGNOSIS — M5416 Radiculopathy, lumbar region: Secondary | ICD-10-CM | POA: Diagnosis not present

## 2015-07-26 DIAGNOSIS — M5126 Other intervertebral disc displacement, lumbar region: Secondary | ICD-10-CM | POA: Diagnosis not present

## 2015-07-26 DIAGNOSIS — M545 Low back pain: Secondary | ICD-10-CM | POA: Diagnosis not present

## 2015-08-22 DIAGNOSIS — M5418 Radiculopathy, sacral and sacrococcygeal region: Secondary | ICD-10-CM | POA: Diagnosis not present

## 2015-08-22 DIAGNOSIS — M5416 Radiculopathy, lumbar region: Secondary | ICD-10-CM | POA: Diagnosis not present

## 2015-08-22 DIAGNOSIS — M4806 Spinal stenosis, lumbar region: Secondary | ICD-10-CM | POA: Diagnosis not present

## 2015-08-22 DIAGNOSIS — M545 Low back pain: Secondary | ICD-10-CM | POA: Diagnosis not present

## 2015-08-22 DIAGNOSIS — M5126 Other intervertebral disc displacement, lumbar region: Secondary | ICD-10-CM | POA: Diagnosis not present

## 2015-10-02 DIAGNOSIS — H01001 Unspecified blepharitis right upper eyelid: Secondary | ICD-10-CM | POA: Diagnosis not present

## 2015-10-02 DIAGNOSIS — H43813 Vitreous degeneration, bilateral: Secondary | ICD-10-CM | POA: Diagnosis not present

## 2015-10-02 DIAGNOSIS — H52203 Unspecified astigmatism, bilateral: Secondary | ICD-10-CM | POA: Diagnosis not present

## 2015-10-02 DIAGNOSIS — H35363 Drusen (degenerative) of macula, bilateral: Secondary | ICD-10-CM | POA: Diagnosis not present

## 2015-12-10 DIAGNOSIS — M5416 Radiculopathy, lumbar region: Secondary | ICD-10-CM | POA: Diagnosis not present

## 2015-12-10 DIAGNOSIS — M4806 Spinal stenosis, lumbar region: Secondary | ICD-10-CM | POA: Diagnosis not present

## 2015-12-27 DIAGNOSIS — L812 Freckles: Secondary | ICD-10-CM | POA: Diagnosis not present

## 2015-12-27 DIAGNOSIS — Z85828 Personal history of other malignant neoplasm of skin: Secondary | ICD-10-CM | POA: Diagnosis not present

## 2015-12-27 DIAGNOSIS — D1801 Hemangioma of skin and subcutaneous tissue: Secondary | ICD-10-CM | POA: Diagnosis not present

## 2015-12-27 DIAGNOSIS — D692 Other nonthrombocytopenic purpura: Secondary | ICD-10-CM | POA: Diagnosis not present

## 2015-12-27 DIAGNOSIS — L570 Actinic keratosis: Secondary | ICD-10-CM | POA: Diagnosis not present

## 2015-12-28 DIAGNOSIS — E784 Other hyperlipidemia: Secondary | ICD-10-CM | POA: Diagnosis not present

## 2015-12-28 DIAGNOSIS — I10 Essential (primary) hypertension: Secondary | ICD-10-CM | POA: Diagnosis not present

## 2016-01-04 DIAGNOSIS — Z6828 Body mass index (BMI) 28.0-28.9, adult: Secondary | ICD-10-CM | POA: Diagnosis not present

## 2016-01-04 DIAGNOSIS — I739 Peripheral vascular disease, unspecified: Secondary | ICD-10-CM | POA: Diagnosis not present

## 2016-01-04 DIAGNOSIS — J449 Chronic obstructive pulmonary disease, unspecified: Secondary | ICD-10-CM | POA: Diagnosis not present

## 2016-01-04 DIAGNOSIS — N183 Chronic kidney disease, stage 3 (moderate): Secondary | ICD-10-CM | POA: Diagnosis not present

## 2016-01-04 DIAGNOSIS — Z0000 Encounter for general adult medical examination without abnormal findings: Secondary | ICD-10-CM | POA: Diagnosis not present

## 2016-01-04 DIAGNOSIS — E784 Other hyperlipidemia: Secondary | ICD-10-CM | POA: Diagnosis not present

## 2016-01-04 DIAGNOSIS — I8291 Chronic embolism and thrombosis of unspecified vein: Secondary | ICD-10-CM | POA: Diagnosis not present

## 2016-01-04 DIAGNOSIS — Z1389 Encounter for screening for other disorder: Secondary | ICD-10-CM | POA: Diagnosis not present

## 2016-01-04 DIAGNOSIS — F419 Anxiety disorder, unspecified: Secondary | ICD-10-CM | POA: Diagnosis not present

## 2016-01-04 DIAGNOSIS — I10 Essential (primary) hypertension: Secondary | ICD-10-CM | POA: Diagnosis not present

## 2016-01-04 DIAGNOSIS — R252 Cramp and spasm: Secondary | ICD-10-CM | POA: Diagnosis not present

## 2016-01-04 DIAGNOSIS — Z23 Encounter for immunization: Secondary | ICD-10-CM | POA: Diagnosis not present

## 2016-01-07 DIAGNOSIS — M5416 Radiculopathy, lumbar region: Secondary | ICD-10-CM | POA: Diagnosis not present

## 2016-01-07 DIAGNOSIS — M4806 Spinal stenosis, lumbar region: Secondary | ICD-10-CM | POA: Diagnosis not present

## 2016-01-07 DIAGNOSIS — M5418 Radiculopathy, sacral and sacrococcygeal region: Secondary | ICD-10-CM | POA: Diagnosis not present

## 2016-03-05 DIAGNOSIS — M545 Low back pain: Secondary | ICD-10-CM | POA: Diagnosis not present

## 2016-03-05 DIAGNOSIS — I10 Essential (primary) hypertension: Secondary | ICD-10-CM | POA: Diagnosis not present

## 2016-03-05 DIAGNOSIS — Z6827 Body mass index (BMI) 27.0-27.9, adult: Secondary | ICD-10-CM | POA: Diagnosis not present

## 2016-03-05 DIAGNOSIS — M5416 Radiculopathy, lumbar region: Secondary | ICD-10-CM | POA: Diagnosis not present

## 2016-03-05 DIAGNOSIS — M5418 Radiculopathy, sacral and sacrococcygeal region: Secondary | ICD-10-CM | POA: Diagnosis not present

## 2016-03-05 DIAGNOSIS — M5126 Other intervertebral disc displacement, lumbar region: Secondary | ICD-10-CM | POA: Diagnosis not present

## 2016-04-14 DIAGNOSIS — M5126 Other intervertebral disc displacement, lumbar region: Secondary | ICD-10-CM | POA: Diagnosis not present

## 2016-04-14 DIAGNOSIS — M5418 Radiculopathy, sacral and sacrococcygeal region: Secondary | ICD-10-CM | POA: Diagnosis not present

## 2016-05-27 DIAGNOSIS — Z6828 Body mass index (BMI) 28.0-28.9, adult: Secondary | ICD-10-CM | POA: Diagnosis not present

## 2016-05-27 DIAGNOSIS — I10 Essential (primary) hypertension: Secondary | ICD-10-CM | POA: Diagnosis not present

## 2016-05-27 DIAGNOSIS — M5418 Radiculopathy, sacral and sacrococcygeal region: Secondary | ICD-10-CM | POA: Diagnosis not present

## 2016-05-27 DIAGNOSIS — M5126 Other intervertebral disc displacement, lumbar region: Secondary | ICD-10-CM | POA: Diagnosis not present

## 2016-07-02 DIAGNOSIS — R252 Cramp and spasm: Secondary | ICD-10-CM | POA: Diagnosis not present

## 2016-07-02 DIAGNOSIS — M859 Disorder of bone density and structure, unspecified: Secondary | ICD-10-CM | POA: Diagnosis not present

## 2016-07-02 DIAGNOSIS — Z6828 Body mass index (BMI) 28.0-28.9, adult: Secondary | ICD-10-CM | POA: Diagnosis not present

## 2016-07-02 DIAGNOSIS — N183 Chronic kidney disease, stage 3 (moderate): Secondary | ICD-10-CM | POA: Diagnosis not present

## 2016-07-02 DIAGNOSIS — E784 Other hyperlipidemia: Secondary | ICD-10-CM | POA: Diagnosis not present

## 2016-07-02 DIAGNOSIS — J449 Chronic obstructive pulmonary disease, unspecified: Secondary | ICD-10-CM | POA: Diagnosis not present

## 2016-07-02 DIAGNOSIS — I10 Essential (primary) hypertension: Secondary | ICD-10-CM | POA: Diagnosis not present

## 2016-07-21 DIAGNOSIS — M5126 Other intervertebral disc displacement, lumbar region: Secondary | ICD-10-CM | POA: Diagnosis not present

## 2016-07-21 DIAGNOSIS — M545 Low back pain: Secondary | ICD-10-CM | POA: Diagnosis not present

## 2016-08-18 DIAGNOSIS — M5418 Radiculopathy, sacral and sacrococcygeal region: Secondary | ICD-10-CM | POA: Diagnosis not present

## 2016-08-18 DIAGNOSIS — M545 Low back pain: Secondary | ICD-10-CM | POA: Diagnosis not present

## 2016-08-18 DIAGNOSIS — I10 Essential (primary) hypertension: Secondary | ICD-10-CM | POA: Diagnosis not present

## 2016-08-18 DIAGNOSIS — Z6828 Body mass index (BMI) 28.0-28.9, adult: Secondary | ICD-10-CM | POA: Diagnosis not present

## 2016-08-18 DIAGNOSIS — M5126 Other intervertebral disc displacement, lumbar region: Secondary | ICD-10-CM | POA: Diagnosis not present

## 2016-08-18 DIAGNOSIS — M5416 Radiculopathy, lumbar region: Secondary | ICD-10-CM | POA: Diagnosis not present

## 2016-10-10 DIAGNOSIS — Z961 Presence of intraocular lens: Secondary | ICD-10-CM | POA: Diagnosis not present

## 2016-10-10 DIAGNOSIS — H04123 Dry eye syndrome of bilateral lacrimal glands: Secondary | ICD-10-CM | POA: Diagnosis not present

## 2016-10-10 DIAGNOSIS — H52203 Unspecified astigmatism, bilateral: Secondary | ICD-10-CM | POA: Diagnosis not present

## 2016-10-10 DIAGNOSIS — H43813 Vitreous degeneration, bilateral: Secondary | ICD-10-CM | POA: Diagnosis not present

## 2016-10-14 DIAGNOSIS — H353131 Nonexudative age-related macular degeneration, bilateral, early dry stage: Secondary | ICD-10-CM | POA: Diagnosis not present

## 2016-10-14 DIAGNOSIS — H35051 Retinal neovascularization, unspecified, right eye: Secondary | ICD-10-CM | POA: Diagnosis not present

## 2016-10-28 ENCOUNTER — Emergency Department (HOSPITAL_BASED_OUTPATIENT_CLINIC_OR_DEPARTMENT_OTHER): Payer: Medicare Other

## 2016-10-28 ENCOUNTER — Emergency Department (HOSPITAL_BASED_OUTPATIENT_CLINIC_OR_DEPARTMENT_OTHER)
Admission: EM | Admit: 2016-10-28 | Discharge: 2016-10-28 | Disposition: A | Discharge status: Home / Self Care | Payer: Medicare Other | Attending: Emergency Medicine | Admitting: Emergency Medicine

## 2016-10-28 ENCOUNTER — Encounter (HOSPITAL_BASED_OUTPATIENT_CLINIC_OR_DEPARTMENT_OTHER): Payer: Self-pay | Admitting: Emergency Medicine

## 2016-10-28 DIAGNOSIS — N183 Chronic kidney disease, stage 3 (moderate): Secondary | ICD-10-CM | POA: Diagnosis not present

## 2016-10-28 DIAGNOSIS — M79604 Pain in right leg: Secondary | ICD-10-CM | POA: Diagnosis not present

## 2016-10-28 DIAGNOSIS — R2241 Localized swelling, mass and lump, right lower limb: Secondary | ICD-10-CM | POA: Diagnosis not present

## 2016-10-28 DIAGNOSIS — J449 Chronic obstructive pulmonary disease, unspecified: Secondary | ICD-10-CM | POA: Diagnosis not present

## 2016-10-28 DIAGNOSIS — Z471 Aftercare following joint replacement surgery: Secondary | ICD-10-CM | POA: Diagnosis not present

## 2016-10-28 DIAGNOSIS — Z87891 Personal history of nicotine dependence: Secondary | ICD-10-CM | POA: Insufficient documentation

## 2016-10-28 DIAGNOSIS — M79661 Pain in right lower leg: Secondary | ICD-10-CM

## 2016-10-28 DIAGNOSIS — Z96651 Presence of right artificial knee joint: Secondary | ICD-10-CM | POA: Diagnosis not present

## 2016-10-28 DIAGNOSIS — M7989 Other specified soft tissue disorders: Secondary | ICD-10-CM

## 2016-10-28 DIAGNOSIS — Z79899 Other long term (current) drug therapy: Secondary | ICD-10-CM | POA: Diagnosis not present

## 2016-10-28 DIAGNOSIS — I129 Hypertensive chronic kidney disease with stage 1 through stage 4 chronic kidney disease, or unspecified chronic kidney disease: Secondary | ICD-10-CM | POA: Insufficient documentation

## 2016-10-28 NOTE — ED Provider Notes (Signed)
[place] DEPT [facility] Provider Note   CSN: [id number] Arrival date & time: 10/28/16  1616  By signing my name below, I, Judith Boone, attest that this documentation has been prepared under the direction and in the presence of [name], MD. Electronically Signed: Reola Boone, ED Scribe. 10/28/16. 6:04 PM.  History   Chief Complaint Chief Complaint  Patient presents with  . Leg Pain   The history is provided by the patient and medical records. No language interpreter was used.    HPI Comments: Judith Boone is a 83 y.o. female with a PMHx of DVT, HTN/HLD, CKD stage III, who presents to the Emergency Department complaining of persistent right lower leg swelling beginning approximately last night, worsening throughout today. Pt reports that one week ago she dropped an iPad on her right lower leg; the extremity was otherwise normal until last night when her swelling began over the area she dropped the device. She notes associated 2-3/10 pain upon palpation over the leg. Pt has been ambulatory since the incident without significant difficulty. Pt notes that she does have a prior h/o DVT, however, she only takes 81mg  ASA daily. Per prior chart review, she was on anticoagulant therapy approximately two years ago; however, her vascular surgeon Sherren Mocha Early) felt that her thrombus was chronic and her anticoagulant therapy was discontinued. Pt denies chest pain, shortness of breath, fever, cough, urgency, frequency, hematuria, dysuria, difficulty urinating, numbness, or any other associated symptoms.   Past Medical History:  Diagnosis Date  . Allergy    allergic rhinitis  . Arthritis   . Cellulitis of left leg 07-2014   hospitalized for 4 days  . COPD (chronic obstructive pulmonary disease) ([place])   . GERD (gastroesophageal reflux disease)   . History of granulomatous disease ([place])   . Hyperlipidemia   . Hypertension   . Left leg swelling   . Right-sided low back pain  with sciatica    Patient Active Problem List   Diagnosis Date Noted  . CKD (chronic kidney disease) stage 3, GFR 30-59 ml/min 08/17/2014  . Sepsis ([place]) 08/17/2014  . Cellulitis of leg, left 08/16/2014  . Pain of great toe 03/23/2012  . Leg swelling 03/23/2012  . Decreased hearing 12/23/2011  . Renal insufficiency 09/27/2011  . Insomnia 05/16/2011  . Radicular leg pain 05/16/2011  . Hearing loss 12/12/2010  . HTN (hypertension) 07/02/2010  . Other and unspecified hyperlipidemia 07/02/2010  . Arthritis 07/02/2010  . GERD (gastroesophageal reflux disease) 07/02/2010  . Esophageal web 07/02/2010  . PAD (peripheral artery disease) ([place]) 07/02/2010   Past Surgical History:  Procedure Laterality Date  . ABDOMINAL HYSTERECTOMY    . broken ankle repair     left  . CATARACT EXTRACTION    . TOTAL KNEE ARTHROPLASTY     right   OB History    No data available     Home Medications    Prior to Admission medications   Medication Sig Start Date End Date Taking? Authorizing Provider  amLODipine (NORVASC) 5 MG tablet Take 5 mg by mouth daily. 04/13/15   [provider]  Ascorbic Acid (VITAMIN C) 500 MG tablet Take 500 mg by mouth daily. 2 per day     [provider]  aspirin 81 MG tablet Take 81 mg by mouth 3 (three) times a week. No specific day    [provider]  atorvastatin (LIPITOR) 10 MG tablet Take 10 mg by mouth every other day. 03/17/15   [provider]  calcium carbonate (OS-CAL) 600 MG TABS Take 600 mg by mouth 2 (two) times daily with a meal.      [provider]  Cholecalciferol (VITAMIN D3) 1000 UNITS CAPS Take 1 capsule by mouth 2 (two) times daily. 2 per day    [provider]  CRESTOR 5 MG tablet TAKE 1 TALET TWICE A WEEK 07/23/12   Mosie Lukes, MD  gabapentin (NEURONTIN) 100 MG capsule Take 100 mg by mouth 3 (three) times daily.    [provider]  hydrALAZINE (APRESOLINE) 25 MG tablet Take 25 mg by  mouth 2 (two) times daily.     [provider]  loratadine (CLARITIN) 10 MG tablet Take 10 mg by mouth daily.    [provider]  Multiple Vitamins-Minerals ([employer] SPECTRAVITE PO) Take 1 tablet by mouth daily.     [provider]  omeprazole (PRILOSEC) 20 MG capsule  05/22/12   [provider]  pramipexole (MIRAPEX) 0.125 MG tablet Take 0.125 mg by mouth at bedtime. 02/09/15   [provider]  quinapril (ACCUPRIL) 40 MG tablet Take 40 mg by mouth at bedtime.    [provider]  saccharomyces boulardii (FLORASTOR) 250 MG capsule Take 1 capsule (250 mg total) by mouth 2 (two) times daily. Patient not taking: Reported on 10/17/2014 08/20/14   Thurnell Lose, MD  traMADol (ULTRAM) 50 MG tablet Take 50 mg by mouth at bedtime.    [provider]  VENTOLIN HFA 108 (90 BASE) MCG/ACT inhaler USE 1 TO 2 PUFFS EVERY 8 HOURS AS NEEDED FOR SHORTNESS OF BREATH 03/03/15   [provider]   Family History Family History  Problem Relation Age of Onset  . Alcohol abuse Father   . Heart disease Father   . Hypertension Father   . Hypertension Daughter   . Hypertension Son    Social History Social History  Substance Use Topics  . Smoking status: Former Smoker    Quit date: 04/29/1979  . Smokeless tobacco: Never Used  . Alcohol use 0.0 oz/week   Allergies   Bacitracin; Neosporin [neomycin-bacitracin zn-polymyx]; and Polysporin [bacitracin-polymyxin b]  Review of Systems Review of Systems  Constitutional: Negative for fever.  Respiratory: Negative for cough and shortness of breath.   Cardiovascular: Negative for chest pain.  Genitourinary: Negative for difficulty urinating, dysuria, frequency, hematuria and urgency.  Musculoskeletal: Positive for arthralgias and myalgias. Negative for gait problem.  Neurological: Negative for numbness.  All other systems reviewed and are negative.  Physical Exam Updated Vital Signs BP 131/86 (BP  Location: Right Arm)   Pulse 98   Temp 98.2 F (36.8 C) (Oral)   Resp 20   Ht 5' (1.524 m)   Wt 62.6 kg (138 lb)   SpO2 95%   BMI 26.95 kg/m   Physical Exam  Constitutional: She appears well-developed and well-nourished. No distress.  HENT:  Head: Normocephalic and atraumatic.  Eyes: Conjunctivae are normal.  Neck: Normal range of motion.  Cardiovascular: Normal rate, regular rhythm and normal heart sounds.   No murmur heard. Pulmonary/Chest: Effort normal and breath sounds normal. No respiratory distress. She has no wheezes. She has no rales.  Abdominal: She exhibits no distension.  Musculoskeletal: Normal range of motion. She exhibits edema.  Swelling in the RLE. RLE edema>left. Good pulses to the BLE.   Neurological: She is alert.  Skin: No pallor.  Psychiatric: She has a normal mood and affect. Her behavior is normal.  Nursing note  and vitals reviewed.  ED Treatments / Results  DIAGNOSTIC STUDIES: Oxygen Saturation is 94% on RA, adequate by my interpretation.   COORDINATION OF CARE: 6:00 PM-Discussed next steps with pt. Pt verbalized understanding and is agreeable with the plan.   Labs (all labs ordered are listed, but only abnormal results are displayed) Labs Reviewed - No data to display  EKG  EKG Interpretation None      Radiology Dg Tibia/fibula Right  Result Date: 10/28/2016 CLINICAL DATA:  Right lower leg swelling for 2 days. No known injury. EXAM: RIGHT TIBIA AND FIBULA - 2 VIEW COMPARISON:  None. FINDINGS: There is no evidence of fracture or other focal bone lesions. Right knee replacement noted. Small plantar calcaneal spur is seen. Soft tissues are unremarkable. IMPRESSION: No acute abnormality. Status post right knee replacement. Electronically Signed   By: Inge Rise M.D.   On: 10/28/2016 18:47   US Venous Img Lower Unilateral Right  Result Date: 10/28/2016 CLINICAL DATA:  Right lower extremity swelling. EXAM: RIGHT LOWER EXTREMITY VENOUS  DOPPLER ULTRASOUND TECHNIQUE: Gray-scale sonography with graded compression, as well as color Doppler and duplex ultrasound were performed to evaluate the lower extremity deep venous systems from the level of the common femoral vein and including the common femoral, femoral, profunda femoral, popliteal and calf veins including the posterior tibial, peroneal and gastrocnemius veins when visible. The superficial great saphenous vein was also interrogated. Spectral Doppler was utilized to evaluate flow at rest and with distal augmentation maneuvers in the common femoral, femoral and popliteal veins. COMPARISON:  None. FINDINGS: Contralateral Common Femoral Vein: Respiratory phasicity is normal and symmetric with the symptomatic side. No evidence of thrombus. Normal compressibility. Common Femoral Vein: No evidence of thrombus. Normal compressibility, respiratory phasicity and response to augmentation. Saphenofemoral Junction: No evidence of thrombus. Normal compressibility and flow on color Doppler imaging. Profunda Femoral Vein: No evidence of thrombus. Normal compressibility and flow on color Doppler imaging. Femoral Vein: No evidence of thrombus. Normal compressibility, respiratory phasicity and response to augmentation. Popliteal Vein: No evidence of thrombus. Normal compressibility, respiratory phasicity and response to augmentation. Calf Veins: No evidence of thrombus. Normal compressibility and flow on color Doppler imaging. Superficial Great Saphenous Vein: No evidence of thrombus. Normal compressibility and flow on color Doppler imaging. Venous Reflux:  None. Other Findings:  None. IMPRESSION: No evidence of DVT within the right lower extremity. Electronically Signed   By: Rolm Baptise M.D.   On: 10/28/2016 19:32    Procedures Procedures   Medications Ordered in ED Medications - No data to display  Initial Impression / Assessment and Plan / ED Course  I have reviewed the triage vital signs and the  nursing notes.  Pertinent labs & imaging results that were available during my care of the patient were reviewed by me and considered in my medical decision making (see chart for details).     83yo female with history of htn, hlpd, COPD, DVT, CKD presents with concern for right leg swelling and pain after dropping an ipad on her leg. XR shows no fx. DVT study negative.  Likely contusion. Patient discharged in stable condition with understanding of reasons to return.   Final Clinical Impressions(s) / ED Diagnoses   Final diagnoses:  Right leg pain  Pain and swelling of right lower leg   New Prescriptions Discharge Medication List as of 10/28/2016  8:18 PM     I personally performed the services described in this documentation, which was scribed in my presence.  The recorded information has been reviewed and is accurate.    [name], MD 10/29/16 1215

## 2016-10-28 NOTE — ED Triage Notes (Signed)
Patient reports that she dropped her ipad last week on her right ankle. Last night and today she started to have swelling up her leg

## 2016-10-28 NOTE — ED Notes (Signed)
Patient transported to Ultrasound 

## 2016-11-06 DIAGNOSIS — L03115 Cellulitis of right lower limb: Secondary | ICD-10-CM | POA: Diagnosis not present

## 2016-12-05 ENCOUNTER — Encounter (HOSPITAL_BASED_OUTPATIENT_CLINIC_OR_DEPARTMENT_OTHER): Payer: Self-pay | Admitting: Emergency Medicine

## 2016-12-05 ENCOUNTER — Emergency Department (HOSPITAL_BASED_OUTPATIENT_CLINIC_OR_DEPARTMENT_OTHER)
Admission: EM | Admit: 2016-12-05 | Discharge: 2016-12-05 | Disposition: A | Discharge status: Home / Self Care | Payer: Medicare Other | Attending: Emergency Medicine | Admitting: Emergency Medicine

## 2016-12-05 DIAGNOSIS — J449 Chronic obstructive pulmonary disease, unspecified: Secondary | ICD-10-CM | POA: Diagnosis not present

## 2016-12-05 DIAGNOSIS — T148XXA Other injury of unspecified body region, initial encounter: Secondary | ICD-10-CM

## 2016-12-05 DIAGNOSIS — L0889 Other specified local infections of the skin and subcutaneous tissue: Secondary | ICD-10-CM | POA: Diagnosis not present

## 2016-12-05 DIAGNOSIS — Z4800 Encounter for change or removal of nonsurgical wound dressing: Secondary | ICD-10-CM | POA: Diagnosis present

## 2016-12-05 DIAGNOSIS — Z87891 Personal history of nicotine dependence: Secondary | ICD-10-CM | POA: Diagnosis not present

## 2016-12-05 DIAGNOSIS — I129 Hypertensive chronic kidney disease with stage 1 through stage 4 chronic kidney disease, or unspecified chronic kidney disease: Secondary | ICD-10-CM | POA: Diagnosis not present

## 2016-12-05 DIAGNOSIS — L03116 Cellulitis of left lower limb: Secondary | ICD-10-CM

## 2016-12-05 DIAGNOSIS — Z7982 Long term (current) use of aspirin: Secondary | ICD-10-CM | POA: Diagnosis not present

## 2016-12-05 DIAGNOSIS — N183 Chronic kidney disease, stage 3 (moderate): Secondary | ICD-10-CM | POA: Diagnosis not present

## 2016-12-05 DIAGNOSIS — Z79899 Other long term (current) drug therapy: Secondary | ICD-10-CM | POA: Diagnosis not present

## 2016-12-05 DIAGNOSIS — L089 Local infection of the skin and subcutaneous tissue, unspecified: Secondary | ICD-10-CM

## 2016-12-05 MED ORDER — CEPHALEXIN 250 MG PO CAPS
500.0000 mg | ORAL_CAPSULE | Freq: Once | ORAL | Status: AC
  Administered 2016-12-05: 500 mg via ORAL
  Filled 2016-12-05: qty 2

## 2016-12-05 MED ORDER — CEPHALEXIN 500 MG PO CAPS: 500.0000 mg | ORAL_CAPSULE | Freq: Four times a day (QID) | ORAL | 0 refills | Status: DC

## 2016-12-05 NOTE — ED Provider Notes (Signed)
[place] DEPT [facility] Provider Note   CSN: [id number] Arrival date & time: 12/05/16  1914     History   Chief Complaint Chief Complaint  Patient presents with  . Wound Check    HPI Judith Boone is a 83 y.o. female.  Pt presents to the ED today with left wound drainage.  The pt said she dropped her ipad on her left lower extremity and sustained a cut about 1 month ago.  It was healing until about 2 days ago, when she noticed some yellow drainage.  She was vacationing at [facility] this week and went to the urgent care there.  They told her to go to the ED, so she came back home and came here.      Past Medical History:  Diagnosis Date  . Allergy    allergic rhinitis  . Arthritis   . Cellulitis of left leg 07-2014   hospitalized for 4 days  . COPD (chronic obstructive pulmonary disease) ([place])   . GERD (gastroesophageal reflux disease)   . History of granulomatous disease ([place])   . Hyperlipidemia   . Hypertension   . Left leg swelling   . Right-sided low back pain with sciatica     Patient Active Problem List   Diagnosis Date Noted  . CKD (chronic kidney disease) stage 3, GFR 30-59 ml/min 08/17/2014  . Sepsis ([place]) 08/17/2014  . Cellulitis of leg, left 08/16/2014  . Pain of great toe 03/23/2012  . Leg swelling 03/23/2012  . Decreased hearing 12/23/2011  . Renal insufficiency 09/27/2011  . Insomnia 05/16/2011  . Radicular leg pain 05/16/2011  . Hearing loss 12/12/2010  . HTN (hypertension) 07/02/2010  . Other and unspecified hyperlipidemia 07/02/2010  . Arthritis 07/02/2010  . GERD (gastroesophageal reflux disease) 07/02/2010  . Esophageal web 07/02/2010  . PAD (peripheral artery disease) ([place]) 07/02/2010    Past Surgical History:  Procedure Laterality Date  . ABDOMINAL HYSTERECTOMY    . broken ankle repair     left  . CATARACT EXTRACTION    . TOTAL KNEE ARTHROPLASTY     right    OB History    No data available       Home Medications      Prior to Admission medications   Medication Sig Start Date End Date Taking? Authorizing Provider  amLODipine (NORVASC) 5 MG tablet Take 5 mg by mouth daily. 04/13/15   [provider]  Ascorbic Acid (VITAMIN C) 500 MG tablet Take 500 mg by mouth daily. 2 per day     [provider]  aspirin 81 MG tablet Take 81 mg by mouth 3 (three) times a week. No specific day    [provider]  atorvastatin (LIPITOR) 10 MG tablet Take 10 mg by mouth every other day. 03/17/15   [provider]  calcium carbonate (OS-CAL) 600 MG TABS Take 600 mg by mouth 2 (two) times daily with a meal.      [provider]  cephALEXin (KEFLEX) 500 MG capsule Take 1 capsule (500 mg total) by mouth 4 (four) times daily. 12/05/16   Isla Pence, MD  Cholecalciferol (VITAMIN D3) 1000 UNITS CAPS Take 1 capsule by mouth 2 (two) times daily. 2 per day    [provider]  CRESTOR 5 MG tablet TAKE 1 TALET TWICE A WEEK 07/23/12   Mosie Lukes, MD  gabapentin (NEURONTIN) 100 MG capsule Take 100 mg by mouth 3 (three) times daily.    [provider]  hydrALAZINE (APRESOLINE) 25 MG tablet Take 25 mg by mouth 2 (two) times daily.     [provider]  loratadine (CLARITIN) 10 MG tablet Take 10 mg by mouth daily.    [provider]  Multiple Vitamins-Minerals ([employer]S SPECTRAVITE PO) Take 1 tablet by mouth daily.     [provider]  omeprazole (PRILOSEC) 20 MG capsule  05/22/12   [provider]  pramipexole (MIRAPEX) 0.125 MG tablet Take 0.125 mg by mouth at bedtime. 02/09/15   [provider]  quinapril (ACCUPRIL) 40 MG tablet Take 40 mg by mouth at bedtime.    [provider]  saccharomyces boulardii (FLORASTOR) 250 MG capsule Take 1 capsule (250 mg total) by mouth 2 (two) times daily. Patient not taking: Reported on 10/17/2014 08/20/14   Thurnell Lose, MD  traMADol (ULTRAM) 50 MG tablet Take 50 mg by mouth at bedtime.     [provider]  VENTOLIN HFA 108 (90 BASE) MCG/ACT inhaler USE 1 TO 2 PUFFS EVERY 8 HOURS AS NEEDED FOR SHORTNESS OF BREATH 03/03/15   [provider]    Family History Family History  Problem Relation Age of Onset  . Alcohol abuse Father   . Heart disease Father   . Hypertension Father   . Hypertension Daughter   . Hypertension Son     Social History Social History  Substance Use Topics  . Smoking status: Former Smoker    Quit date: 04/29/1979  . Smokeless tobacco: Never Used  . Alcohol use 0.0 oz/week     Allergies   Bacitracin; Neosporin [neomycin-bacitracin zn-polymyx]; and Polysporin [bacitracin-polymyxin b]   Review of Systems Review of Systems  Skin: Positive for wound.  All other systems reviewed and are negative.    Physical Exam Updated Vital Signs BP (!) 148/82 (BP Location: Left Arm)   Pulse (!) 115   Temp 98.3 F (36.8 C) (Oral)   Resp 18   Ht 5' (1.524 m)   Wt 63.5 kg (140 lb)   SpO2 100%   BMI 27.34 kg/m   Physical Exam  Constitutional: She is oriented to person, place, and time. She appears well-developed and well-nourished.  HENT:  Head: Normocephalic and atraumatic.  Right Ear: External ear normal.  Left Ear: External ear normal.  Nose: Nose normal.  Mouth/Throat: Oropharynx is clear and moist.  Eyes: Pupils are equal, round, and reactive to light. Conjunctivae and EOM are normal.  Neck: Normal range of motion. Neck supple.  Cardiovascular: Normal rate, regular rhythm, normal heart sounds and intact distal pulses.   Pulmonary/Chest: Effort normal and breath sounds normal.  Abdominal: Soft. Bowel sounds are normal.  Neurological: She is alert and oriented to person, place, and time.  Skin:     Psychiatric: She has a normal mood and affect.  Nursing note and vitals reviewed.    ED Treatments / Results  Labs (all labs ordered are listed, but only abnormal results are displayed) Labs Reviewed - No data to  display  EKG  EKG Interpretation None       Radiology No results found.  Procedures Procedures (including critical care time)  Medications Ordered in ED Medications  cephALEXin (KEFLEX) capsule 500 mg (not administered)     Initial Impression / Assessment and Plan / ED Course  I have reviewed the triage vital signs and the nursing notes.  Pertinent labs & imaging results that were available during my care of the patient were reviewed by me and  considered in my medical decision making (see chart for details).    Wound cleaned here.  She was started on keflex.  She knows to return if worse.  F/u with pcp.  Final Clinical Impressions(s) / ED Diagnoses   Final diagnoses:  Cellulitis of left lower extremity  Wound infection    New Prescriptions New Prescriptions   CEPHALEXIN (KEFLEX) 500 MG CAPSULE    Take 1 capsule (500 mg total) by mouth 4 (four) times daily.     Isla Pence, MD 12/05/16 2140

## 2016-12-05 NOTE — ED Triage Notes (Signed)
Patient states that she fell and cut her left shin about a month ago. THe patient reports that it healed nicely until this week when it started to drain yellow driange

## 2016-12-08 DIAGNOSIS — I10 Essential (primary) hypertension: Secondary | ICD-10-CM | POA: Diagnosis not present

## 2016-12-08 DIAGNOSIS — L0889 Other specified local infections of the skin and subcutaneous tissue: Secondary | ICD-10-CM | POA: Diagnosis not present

## 2016-12-08 DIAGNOSIS — S80812A Abrasion, left lower leg, initial encounter: Secondary | ICD-10-CM | POA: Diagnosis not present

## 2016-12-12 DIAGNOSIS — R600 Localized edema: Secondary | ICD-10-CM | POA: Diagnosis not present

## 2016-12-12 DIAGNOSIS — I739 Peripheral vascular disease, unspecified: Secondary | ICD-10-CM | POA: Diagnosis not present

## 2016-12-12 DIAGNOSIS — L0889 Other specified local infections of the skin and subcutaneous tissue: Secondary | ICD-10-CM | POA: Diagnosis not present

## 2016-12-12 DIAGNOSIS — J449 Chronic obstructive pulmonary disease, unspecified: Secondary | ICD-10-CM | POA: Diagnosis not present

## 2016-12-12 DIAGNOSIS — Z6829 Body mass index (BMI) 29.0-29.9, adult: Secondary | ICD-10-CM | POA: Diagnosis not present

## 2016-12-23 ENCOUNTER — Encounter (HOSPITAL_BASED_OUTPATIENT_CLINIC_OR_DEPARTMENT_OTHER): Payer: Medicare Other | Attending: Surgery

## 2016-12-23 DIAGNOSIS — Z7982 Long term (current) use of aspirin: Secondary | ICD-10-CM | POA: Insufficient documentation

## 2016-12-23 DIAGNOSIS — I739 Peripheral vascular disease, unspecified: Secondary | ICD-10-CM | POA: Insufficient documentation

## 2016-12-23 DIAGNOSIS — L97822 Non-pressure chronic ulcer of other part of left lower leg with fat layer exposed: Secondary | ICD-10-CM | POA: Insufficient documentation

## 2016-12-23 DIAGNOSIS — Z96653 Presence of artificial knee joint, bilateral: Secondary | ICD-10-CM | POA: Insufficient documentation

## 2016-12-23 DIAGNOSIS — J449 Chronic obstructive pulmonary disease, unspecified: Secondary | ICD-10-CM | POA: Diagnosis not present

## 2016-12-23 DIAGNOSIS — Z79899 Other long term (current) drug therapy: Secondary | ICD-10-CM | POA: Insufficient documentation

## 2016-12-23 DIAGNOSIS — Z86718 Personal history of other venous thrombosis and embolism: Secondary | ICD-10-CM | POA: Diagnosis not present

## 2016-12-23 DIAGNOSIS — I890 Lymphedema, not elsewhere classified: Secondary | ICD-10-CM | POA: Diagnosis not present

## 2016-12-23 DIAGNOSIS — I10 Essential (primary) hypertension: Secondary | ICD-10-CM | POA: Insufficient documentation

## 2016-12-25 ENCOUNTER — Other Ambulatory Visit: Payer: Self-pay | Admitting: Surgery

## 2016-12-25 ENCOUNTER — Ambulatory Visit (HOSPITAL_COMMUNITY)
Admission: RE | Admit: 2016-12-25 | Discharge: 2016-12-25 | Disposition: A | Discharge status: Home / Self Care | Payer: Medicare Other | Source: Ambulatory Visit | Attending: Vascular Surgery | Admitting: Vascular Surgery

## 2016-12-25 DIAGNOSIS — I8393 Asymptomatic varicose veins of bilateral lower extremities: Secondary | ICD-10-CM | POA: Insufficient documentation

## 2016-12-25 DIAGNOSIS — R600 Localized edema: Secondary | ICD-10-CM | POA: Diagnosis not present

## 2016-12-26 ENCOUNTER — Other Ambulatory Visit (HOSPITAL_COMMUNITY): Payer: Self-pay | Admitting: Physician Assistant

## 2016-12-26 ENCOUNTER — Ambulatory Visit (HOSPITAL_COMMUNITY)
Admission: RE | Admit: 2016-12-26 | Discharge: 2016-12-26 | Disposition: A | Discharge status: Home / Self Care | Payer: Medicare Other | Source: Ambulatory Visit | Attending: Vascular Surgery | Admitting: Vascular Surgery

## 2016-12-26 DIAGNOSIS — R9439 Abnormal result of other cardiovascular function study: Secondary | ICD-10-CM | POA: Insufficient documentation

## 2016-12-26 DIAGNOSIS — R600 Localized edema: Secondary | ICD-10-CM | POA: Diagnosis not present

## 2016-12-26 DIAGNOSIS — L97529 Non-pressure chronic ulcer of other part of left foot with unspecified severity: Secondary | ICD-10-CM | POA: Insufficient documentation

## 2016-12-26 DIAGNOSIS — L97929 Non-pressure chronic ulcer of unspecified part of left lower leg with unspecified severity: Secondary | ICD-10-CM

## 2016-12-31 ENCOUNTER — Encounter (HOSPITAL_BASED_OUTPATIENT_CLINIC_OR_DEPARTMENT_OTHER): Payer: Medicare Other | Attending: Surgery

## 2016-12-31 DIAGNOSIS — I10 Essential (primary) hypertension: Secondary | ICD-10-CM | POA: Diagnosis not present

## 2016-12-31 DIAGNOSIS — I739 Peripheral vascular disease, unspecified: Secondary | ICD-10-CM | POA: Diagnosis not present

## 2016-12-31 DIAGNOSIS — L97222 Non-pressure chronic ulcer of left calf with fat layer exposed: Secondary | ICD-10-CM | POA: Diagnosis not present

## 2016-12-31 DIAGNOSIS — L97829 Non-pressure chronic ulcer of other part of left lower leg with unspecified severity: Secondary | ICD-10-CM | POA: Insufficient documentation

## 2016-12-31 DIAGNOSIS — J449 Chronic obstructive pulmonary disease, unspecified: Secondary | ICD-10-CM | POA: Insufficient documentation

## 2016-12-31 DIAGNOSIS — I872 Venous insufficiency (chronic) (peripheral): Secondary | ICD-10-CM | POA: Diagnosis not present

## 2016-12-31 DIAGNOSIS — Z86718 Personal history of other venous thrombosis and embolism: Secondary | ICD-10-CM | POA: Diagnosis not present

## 2016-12-31 DIAGNOSIS — I890 Lymphedema, not elsewhere classified: Secondary | ICD-10-CM | POA: Diagnosis not present

## 2016-12-31 DIAGNOSIS — S81802A Unspecified open wound, left lower leg, initial encounter: Secondary | ICD-10-CM | POA: Diagnosis not present

## 2017-01-01 DIAGNOSIS — Z85828 Personal history of other malignant neoplasm of skin: Secondary | ICD-10-CM | POA: Diagnosis not present

## 2017-01-01 DIAGNOSIS — D692 Other nonthrombocytopenic purpura: Secondary | ICD-10-CM | POA: Diagnosis not present

## 2017-01-01 DIAGNOSIS — L570 Actinic keratosis: Secondary | ICD-10-CM | POA: Diagnosis not present

## 2017-01-01 DIAGNOSIS — L821 Other seborrheic keratosis: Secondary | ICD-10-CM | POA: Diagnosis not present

## 2017-01-01 DIAGNOSIS — I10 Essential (primary) hypertension: Secondary | ICD-10-CM | POA: Diagnosis not present

## 2017-01-01 DIAGNOSIS — M859 Disorder of bone density and structure, unspecified: Secondary | ICD-10-CM | POA: Diagnosis not present

## 2017-01-01 DIAGNOSIS — E784 Other hyperlipidemia: Secondary | ICD-10-CM | POA: Diagnosis not present

## 2017-01-06 ENCOUNTER — Encounter: Payer: Self-pay | Admitting: Vascular Surgery

## 2017-01-06 DIAGNOSIS — E784 Other hyperlipidemia: Secondary | ICD-10-CM | POA: Diagnosis not present

## 2017-01-06 DIAGNOSIS — Z23 Encounter for immunization: Secondary | ICD-10-CM | POA: Diagnosis not present

## 2017-01-06 DIAGNOSIS — I872 Venous insufficiency (chronic) (peripheral): Secondary | ICD-10-CM | POA: Diagnosis not present

## 2017-01-06 DIAGNOSIS — N951 Menopausal and female climacteric states: Secondary | ICD-10-CM | POA: Diagnosis not present

## 2017-01-06 DIAGNOSIS — L97929 Non-pressure chronic ulcer of unspecified part of left lower leg with unspecified severity: Secondary | ICD-10-CM | POA: Diagnosis not present

## 2017-01-06 DIAGNOSIS — J449 Chronic obstructive pulmonary disease, unspecified: Secondary | ICD-10-CM | POA: Diagnosis not present

## 2017-01-06 DIAGNOSIS — N183 Chronic kidney disease, stage 3 (moderate): Secondary | ICD-10-CM | POA: Diagnosis not present

## 2017-01-06 DIAGNOSIS — Z6829 Body mass index (BMI) 29.0-29.9, adult: Secondary | ICD-10-CM | POA: Diagnosis not present

## 2017-01-06 DIAGNOSIS — I7389 Other specified peripheral vascular diseases: Secondary | ICD-10-CM | POA: Diagnosis not present

## 2017-01-06 DIAGNOSIS — M859 Disorder of bone density and structure, unspecified: Secondary | ICD-10-CM | POA: Diagnosis not present

## 2017-01-06 DIAGNOSIS — I10 Essential (primary) hypertension: Secondary | ICD-10-CM | POA: Diagnosis not present

## 2017-01-06 DIAGNOSIS — Z0000 Encounter for general adult medical examination without abnormal findings: Secondary | ICD-10-CM | POA: Diagnosis not present

## 2017-01-06 DIAGNOSIS — Z1389 Encounter for screening for other disorder: Secondary | ICD-10-CM | POA: Diagnosis not present

## 2017-01-07 DIAGNOSIS — S81802A Unspecified open wound, left lower leg, initial encounter: Secondary | ICD-10-CM | POA: Diagnosis not present

## 2017-01-07 DIAGNOSIS — L97829 Non-pressure chronic ulcer of other part of left lower leg with unspecified severity: Secondary | ICD-10-CM | POA: Diagnosis not present

## 2017-01-07 DIAGNOSIS — J449 Chronic obstructive pulmonary disease, unspecified: Secondary | ICD-10-CM | POA: Diagnosis not present

## 2017-01-07 DIAGNOSIS — I10 Essential (primary) hypertension: Secondary | ICD-10-CM | POA: Diagnosis not present

## 2017-01-07 DIAGNOSIS — Z86718 Personal history of other venous thrombosis and embolism: Secondary | ICD-10-CM | POA: Diagnosis not present

## 2017-01-07 DIAGNOSIS — I890 Lymphedema, not elsewhere classified: Secondary | ICD-10-CM | POA: Diagnosis not present

## 2017-01-07 DIAGNOSIS — L97222 Non-pressure chronic ulcer of left calf with fat layer exposed: Secondary | ICD-10-CM | POA: Diagnosis not present

## 2017-01-08 ENCOUNTER — Encounter: Payer: Self-pay | Admitting: Vascular Surgery

## 2017-01-08 ENCOUNTER — Ambulatory Visit (INDEPENDENT_AMBULATORY_CARE_PROVIDER_SITE_OTHER): Payer: Medicare Other | Admitting: Vascular Surgery

## 2017-01-08 VITALS — BP 138/80 | HR 101 | Temp 97.2°F | Resp 14 | Ht 60.0 in | Wt 143.0 lb

## 2017-01-08 DIAGNOSIS — I739 Peripheral vascular disease, unspecified: Secondary | ICD-10-CM

## 2017-01-08 NOTE — Progress Notes (Signed)
[facility] [place]  Patient name: Judith Boone MRN: [id number] DOB: 05-17-1931 Sex: female  REASON FOR VISIT: Evaluation of ulcerations left leg  HPI: Judith Boone is a 83 y.o. female 9 to me from a prior evaluation for similar difficulty 2 years ago. That time she was found to have a venous hypertension and also mild arterial insufficiency in her left leg. Felt that she would go on to healing and she did. She now presents with a two-month history of new ulcerations on her left leg. She reports that she dropped her i pad on her pretibial area causing 2 small areas of ulceration. She has an area of ulceration over her more proximal lateral calf reports that she scratched this area with her fingernail when putting on her compression garments. She's been seen in the wound center and is having continued treatment with topical agents and compression. She specifically denies any claudication type symptoms. Has had no new medical difficulties  Past Medical History:  Diagnosis Date  . Allergy    allergic rhinitis  . Arthritis   . Cellulitis of left leg 07-2014   hospitalized for 4 days  . COPD (chronic obstructive pulmonary disease) ([place])   . GERD (gastroesophageal reflux disease)   . History of granulomatous disease ([place])   . Hyperlipidemia   . Hypertension   . Left leg swelling   . Right-sided low back pain with sciatica     Family History  Problem Relation Age of Onset  . Alcohol abuse Father   . Heart disease Father   . Hypertension Father   . Hypertension Daughter   . Hypertension Son     SOCIAL HISTORY: Social History  Substance Use Topics  . Smoking status: Former Smoker    Quit date: 04/29/1979  . Smokeless tobacco: Never Used  . Alcohol use 0.0 oz/week    Allergies  Allergen Reactions  . Bacitracin     unknown  . Neosporin [Neomycin-Bacitracin Zn-Polymyx]     unknown  . Polysporin [Bacitracin-Polymyxin B]     unknown    Current Outpatient Prescriptions  Medication Sig Dispense Refill  . amLODipine (NORVASC) 5 MG tablet Take 5 mg by mouth daily.  5  . Ascorbic Acid (VITAMIN C) 500 MG tablet Take 500 mg by mouth daily. 2 per day     . aspirin 81 MG tablet Take 81 mg by mouth 3 (three) times a week. No specific day    . atorvastatin (LIPITOR) 10 MG tablet Take 10 mg by mouth every other day.  4  . calcium carbonate (OS-CAL) 600 MG TABS Take 600 mg by mouth 2 (two) times daily with a meal.      . Cholecalciferol (VITAMIN D3) 1000 UNITS CAPS Take 1 capsule by mouth 2 (two) times daily. 2 per day    . Esomeprazole Magnesium (NEXIUM PO) Take by mouth daily.    . hydrALAZINE (APRESOLINE) 25 MG tablet Take 25 mg by mouth 2 (two) times daily.     Marland Kitchen loratadine (CLARITIN) 10 MG tablet Take 10 mg by mouth daily.    . Multiple Vitamins-Minerals (CVS SPECTRAVITE PO) Take 1 tablet by mouth daily.     . pramipexole (MIRAPEX) 0.125 MG tablet Take 0.125 mg by mouth at bedtime.  3  . quinapril (ACCUPRIL) 40 MG tablet Take 40 mg by mouth at bedtime.    . saccharomyces boulardii (FLORASTOR) 250 MG capsule Take 1 capsule (250 mg total) by mouth 2 (  two) times daily. 30 capsule 0  . traMADol (ULTRAM) 50 MG tablet Take 50 mg by mouth at bedtime.    . VENTOLIN HFA 108 (90 BASE) MCG/ACT inhaler USE 1 TO 2 PUFFS EVERY 8 HOURS AS NEEDED FOR SHORTNESS OF BREATH  3  . cephALEXin (KEFLEX) 500 MG capsule Take 1 capsule (500 mg total) by mouth 4 (four) times daily. (Patient not taking: Reported on 01/08/2017) 28 capsule 0  . CRESTOR 5 MG tablet TAKE 1 TALET TWICE A WEEK (Patient not taking: Reported on 01/08/2017) 24 tablet 0  . gabapentin (NEURONTIN) 100 MG capsule Take 100 mg by mouth 3 (three) times daily.    Marland Kitchen omeprazole (PRILOSEC) 20 MG capsule      No current facility-administered medications for this visit.     REVIEW OF SYSTEMS:  [X]  denotes positive finding, [ ]  denotes negative finding Cardiac  Comments:  Chest pain  or chest pressure:    Shortness of breath upon exertion:    Short of breath when lying flat:    Irregular heart rhythm:        Vascular    Pain in calf, thigh, or hip brought on by ambulation:    Pain in feet at night that wakes you up from your sleep:     Blood clot in your veins:    Leg swelling:  x         PHYSICAL EXAM: Vitals:   01/08/17 1034  BP: 138/80  Pulse: (!) 101  Resp: 14  Temp: (!) 97.2 F (36.2 C)  SpO2: 95%  Weight: 143 lb (64.9 kg)  Height: 5' (1.524 m)    GENERAL: The patient is a well-nourished female, in no acute distress. The vital signs are documented above. CARDIOVASCULAR: Audible right dorsalis pedis pulse. No palpable pedal pulse on the left PULMONARY: There is good air exchange  MUSCULOSKELETAL: There are no major deformities or cyanosis. NEUROLOGIC: No focal weakness or paresthesias are detected. SKIN: 2 ulcerations over her left pretibial area approximately 1 cm in diameter each with good granulation base. No surrounding erythema. Over the lateral more proximal aspect of her lateral calf on the left she has an eschar present over a area approximately 0.5 x 1 cm wound. No surrounding erythema PSYCHIATRIC: The patient has a normal affect.  DATA:  She underwent noninvasive studies in our office on 8:30 and I reviewed these with her. This shows significant deep venous reflux in her left leg throughout her common femoral vein, femoral vein and popliteal vein. She has reflux in the right leg in her common femoral vein and femoral vein. She has small nondilated saphenous veins bilaterally.  Arterial studies revealed normal ankle arm index on the right and an ankle arm index I reduced at 0.76 on the left with biphasic waveforms.  MEDICAL ISSUES: Had long discussion with patient regarding the significance severe venous hypertension related to deep venous reflux. Explained that she does not have any correctable superficial reflux. Also expanded she does have  mild arterial insufficiency but I feel that she has adequate flow for healing these venous stasis ulcers without revascularization attempts. If she has progressive tissue loss could undergo arteriography. I did listen with hand-held Doppler she does have very good flow in dorsalis pedis and posterior tibial on the left. She was reassured with this discussion will see [place] again as needed    Rosetta Posner, MD [facility] Tel [phone] Pager [phone]

## 2017-01-09 DIAGNOSIS — Z1212 Encounter for screening for malignant neoplasm of rectum: Secondary | ICD-10-CM | POA: Diagnosis not present

## 2017-01-13 DIAGNOSIS — M48061 Spinal stenosis, lumbar region without neurogenic claudication: Secondary | ICD-10-CM | POA: Diagnosis not present

## 2017-01-13 DIAGNOSIS — M5416 Radiculopathy, lumbar region: Secondary | ICD-10-CM | POA: Diagnosis not present

## 2017-01-14 DIAGNOSIS — Z86718 Personal history of other venous thrombosis and embolism: Secondary | ICD-10-CM | POA: Diagnosis not present

## 2017-01-14 DIAGNOSIS — J449 Chronic obstructive pulmonary disease, unspecified: Secondary | ICD-10-CM | POA: Diagnosis not present

## 2017-01-14 DIAGNOSIS — L97829 Non-pressure chronic ulcer of other part of left lower leg with unspecified severity: Secondary | ICD-10-CM | POA: Diagnosis not present

## 2017-01-14 DIAGNOSIS — H43813 Vitreous degeneration, bilateral: Secondary | ICD-10-CM | POA: Diagnosis not present

## 2017-01-14 DIAGNOSIS — H353212 Exudative age-related macular degeneration, right eye, with inactive choroidal neovascularization: Secondary | ICD-10-CM | POA: Diagnosis not present

## 2017-01-14 DIAGNOSIS — H353121 Nonexudative age-related macular degeneration, left eye, early dry stage: Secondary | ICD-10-CM | POA: Diagnosis not present

## 2017-01-14 DIAGNOSIS — L97222 Non-pressure chronic ulcer of left calf with fat layer exposed: Secondary | ICD-10-CM | POA: Diagnosis not present

## 2017-01-14 DIAGNOSIS — I10 Essential (primary) hypertension: Secondary | ICD-10-CM | POA: Diagnosis not present

## 2017-01-14 DIAGNOSIS — S81802A Unspecified open wound, left lower leg, initial encounter: Secondary | ICD-10-CM | POA: Diagnosis not present

## 2017-01-14 DIAGNOSIS — I890 Lymphedema, not elsewhere classified: Secondary | ICD-10-CM | POA: Diagnosis not present

## 2017-01-21 DIAGNOSIS — I10 Essential (primary) hypertension: Secondary | ICD-10-CM | POA: Diagnosis not present

## 2017-01-21 DIAGNOSIS — L97822 Non-pressure chronic ulcer of other part of left lower leg with fat layer exposed: Secondary | ICD-10-CM | POA: Diagnosis not present

## 2017-01-21 DIAGNOSIS — I872 Venous insufficiency (chronic) (peripheral): Secondary | ICD-10-CM | POA: Diagnosis not present

## 2017-01-21 DIAGNOSIS — L97222 Non-pressure chronic ulcer of left calf with fat layer exposed: Secondary | ICD-10-CM | POA: Diagnosis not present

## 2017-01-21 DIAGNOSIS — I890 Lymphedema, not elsewhere classified: Secondary | ICD-10-CM | POA: Diagnosis not present

## 2017-01-21 DIAGNOSIS — Z86718 Personal history of other venous thrombosis and embolism: Secondary | ICD-10-CM | POA: Diagnosis not present

## 2017-01-21 DIAGNOSIS — L97829 Non-pressure chronic ulcer of other part of left lower leg with unspecified severity: Secondary | ICD-10-CM | POA: Diagnosis not present

## 2017-01-21 DIAGNOSIS — J449 Chronic obstructive pulmonary disease, unspecified: Secondary | ICD-10-CM | POA: Diagnosis not present

## 2017-02-04 ENCOUNTER — Encounter (HOSPITAL_BASED_OUTPATIENT_CLINIC_OR_DEPARTMENT_OTHER): Payer: Medicare Other | Attending: Surgery

## 2017-02-04 DIAGNOSIS — J449 Chronic obstructive pulmonary disease, unspecified: Secondary | ICD-10-CM | POA: Diagnosis not present

## 2017-02-04 DIAGNOSIS — Z86718 Personal history of other venous thrombosis and embolism: Secondary | ICD-10-CM | POA: Diagnosis not present

## 2017-02-04 DIAGNOSIS — L97829 Non-pressure chronic ulcer of other part of left lower leg with unspecified severity: Secondary | ICD-10-CM | POA: Diagnosis not present

## 2017-02-04 DIAGNOSIS — I10 Essential (primary) hypertension: Secondary | ICD-10-CM | POA: Diagnosis not present

## 2017-02-04 DIAGNOSIS — L97822 Non-pressure chronic ulcer of other part of left lower leg with fat layer exposed: Secondary | ICD-10-CM | POA: Diagnosis not present

## 2017-02-04 DIAGNOSIS — I872 Venous insufficiency (chronic) (peripheral): Secondary | ICD-10-CM | POA: Diagnosis not present

## 2017-02-04 DIAGNOSIS — Z96653 Presence of artificial knee joint, bilateral: Secondary | ICD-10-CM | POA: Insufficient documentation

## 2017-02-10 ENCOUNTER — Ambulatory Visit: Payer: Medicare Other | Admitting: Vascular Surgery

## 2017-02-11 DIAGNOSIS — I10 Essential (primary) hypertension: Secondary | ICD-10-CM | POA: Diagnosis not present

## 2017-02-11 DIAGNOSIS — Z96653 Presence of artificial knee joint, bilateral: Secondary | ICD-10-CM | POA: Diagnosis not present

## 2017-02-11 DIAGNOSIS — Z86718 Personal history of other venous thrombosis and embolism: Secondary | ICD-10-CM | POA: Diagnosis not present

## 2017-02-11 DIAGNOSIS — I872 Venous insufficiency (chronic) (peripheral): Secondary | ICD-10-CM | POA: Diagnosis not present

## 2017-02-11 DIAGNOSIS — J449 Chronic obstructive pulmonary disease, unspecified: Secondary | ICD-10-CM | POA: Diagnosis not present

## 2017-02-11 DIAGNOSIS — L97829 Non-pressure chronic ulcer of other part of left lower leg with unspecified severity: Secondary | ICD-10-CM | POA: Diagnosis not present

## 2017-02-16 DIAGNOSIS — M48061 Spinal stenosis, lumbar region without neurogenic claudication: Secondary | ICD-10-CM | POA: Diagnosis not present

## 2017-02-16 DIAGNOSIS — Z6828 Body mass index (BMI) 28.0-28.9, adult: Secondary | ICD-10-CM | POA: Diagnosis not present

## 2017-02-16 DIAGNOSIS — I10 Essential (primary) hypertension: Secondary | ICD-10-CM | POA: Diagnosis not present

## 2017-02-16 DIAGNOSIS — M5418 Radiculopathy, sacral and sacrococcygeal region: Secondary | ICD-10-CM | POA: Diagnosis not present

## 2017-02-16 DIAGNOSIS — M5416 Radiculopathy, lumbar region: Secondary | ICD-10-CM | POA: Diagnosis not present

## 2017-04-28 DIAGNOSIS — S41101A Unspecified open wound of right upper arm, initial encounter: Secondary | ICD-10-CM | POA: Diagnosis not present

## 2017-05-05 ENCOUNTER — Encounter (HOSPITAL_BASED_OUTPATIENT_CLINIC_OR_DEPARTMENT_OTHER): Payer: Medicare Other | Attending: Internal Medicine

## 2017-05-05 DIAGNOSIS — Z96651 Presence of right artificial knee joint: Secondary | ICD-10-CM | POA: Diagnosis not present

## 2017-05-05 DIAGNOSIS — Z86718 Personal history of other venous thrombosis and embolism: Secondary | ICD-10-CM | POA: Diagnosis not present

## 2017-05-05 DIAGNOSIS — I87323 Chronic venous hypertension (idiopathic) with inflammation of bilateral lower extremity: Secondary | ICD-10-CM | POA: Diagnosis not present

## 2017-05-05 DIAGNOSIS — X58XXXA Exposure to other specified factors, initial encounter: Secondary | ICD-10-CM | POA: Diagnosis not present

## 2017-05-05 DIAGNOSIS — Z87891 Personal history of nicotine dependence: Secondary | ICD-10-CM | POA: Diagnosis not present

## 2017-05-05 DIAGNOSIS — I129 Hypertensive chronic kidney disease with stage 1 through stage 4 chronic kidney disease, or unspecified chronic kidney disease: Secondary | ICD-10-CM | POA: Diagnosis not present

## 2017-05-05 DIAGNOSIS — S81811A Laceration without foreign body, right lower leg, initial encounter: Secondary | ICD-10-CM | POA: Insufficient documentation

## 2017-05-05 DIAGNOSIS — J449 Chronic obstructive pulmonary disease, unspecified: Secondary | ICD-10-CM | POA: Diagnosis not present

## 2017-05-05 DIAGNOSIS — L97812 Non-pressure chronic ulcer of other part of right lower leg with fat layer exposed: Secondary | ICD-10-CM | POA: Diagnosis not present

## 2017-05-05 DIAGNOSIS — S41111A Laceration without foreign body of right upper arm, initial encounter: Secondary | ICD-10-CM | POA: Insufficient documentation

## 2017-05-05 DIAGNOSIS — I87311 Chronic venous hypertension (idiopathic) with ulcer of right lower extremity: Secondary | ICD-10-CM | POA: Diagnosis not present

## 2017-05-05 DIAGNOSIS — N183 Chronic kidney disease, stage 3 (moderate): Secondary | ICD-10-CM | POA: Diagnosis not present

## 2017-05-12 DIAGNOSIS — Z96651 Presence of right artificial knee joint: Secondary | ICD-10-CM | POA: Diagnosis not present

## 2017-05-12 DIAGNOSIS — I129 Hypertensive chronic kidney disease with stage 1 through stage 4 chronic kidney disease, or unspecified chronic kidney disease: Secondary | ICD-10-CM | POA: Diagnosis not present

## 2017-05-12 DIAGNOSIS — S81811A Laceration without foreign body, right lower leg, initial encounter: Secondary | ICD-10-CM | POA: Diagnosis not present

## 2017-05-12 DIAGNOSIS — S41101A Unspecified open wound of right upper arm, initial encounter: Secondary | ICD-10-CM | POA: Diagnosis not present

## 2017-05-12 DIAGNOSIS — I87323 Chronic venous hypertension (idiopathic) with inflammation of bilateral lower extremity: Secondary | ICD-10-CM | POA: Diagnosis not present

## 2017-05-12 DIAGNOSIS — S41111A Laceration without foreign body of right upper arm, initial encounter: Secondary | ICD-10-CM | POA: Diagnosis not present

## 2017-05-12 DIAGNOSIS — S81801A Unspecified open wound, right lower leg, initial encounter: Secondary | ICD-10-CM | POA: Diagnosis not present

## 2017-05-12 DIAGNOSIS — N183 Chronic kidney disease, stage 3 (moderate): Secondary | ICD-10-CM | POA: Diagnosis not present

## 2017-05-13 DIAGNOSIS — H35051 Retinal neovascularization, unspecified, right eye: Secondary | ICD-10-CM | POA: Diagnosis not present

## 2017-05-13 DIAGNOSIS — H43813 Vitreous degeneration, bilateral: Secondary | ICD-10-CM | POA: Diagnosis not present

## 2017-05-13 DIAGNOSIS — H353121 Nonexudative age-related macular degeneration, left eye, early dry stage: Secondary | ICD-10-CM | POA: Diagnosis not present

## 2017-05-19 DIAGNOSIS — N183 Chronic kidney disease, stage 3 (moderate): Secondary | ICD-10-CM | POA: Diagnosis not present

## 2017-05-19 DIAGNOSIS — S81811A Laceration without foreign body, right lower leg, initial encounter: Secondary | ICD-10-CM | POA: Diagnosis not present

## 2017-05-19 DIAGNOSIS — S81801A Unspecified open wound, right lower leg, initial encounter: Secondary | ICD-10-CM | POA: Diagnosis not present

## 2017-05-19 DIAGNOSIS — I129 Hypertensive chronic kidney disease with stage 1 through stage 4 chronic kidney disease, or unspecified chronic kidney disease: Secondary | ICD-10-CM | POA: Diagnosis not present

## 2017-05-19 DIAGNOSIS — I87323 Chronic venous hypertension (idiopathic) with inflammation of bilateral lower extremity: Secondary | ICD-10-CM | POA: Diagnosis not present

## 2017-05-19 DIAGNOSIS — Z96651 Presence of right artificial knee joint: Secondary | ICD-10-CM | POA: Diagnosis not present

## 2017-05-19 DIAGNOSIS — S41111A Laceration without foreign body of right upper arm, initial encounter: Secondary | ICD-10-CM | POA: Diagnosis not present

## 2017-05-26 DIAGNOSIS — I87311 Chronic venous hypertension (idiopathic) with ulcer of right lower extremity: Secondary | ICD-10-CM | POA: Diagnosis not present

## 2017-05-26 DIAGNOSIS — Z96651 Presence of right artificial knee joint: Secondary | ICD-10-CM | POA: Diagnosis not present

## 2017-05-26 DIAGNOSIS — S41111A Laceration without foreign body of right upper arm, initial encounter: Secondary | ICD-10-CM | POA: Diagnosis not present

## 2017-05-26 DIAGNOSIS — I87323 Chronic venous hypertension (idiopathic) with inflammation of bilateral lower extremity: Secondary | ICD-10-CM | POA: Diagnosis not present

## 2017-05-26 DIAGNOSIS — S81811A Laceration without foreign body, right lower leg, initial encounter: Secondary | ICD-10-CM | POA: Diagnosis not present

## 2017-05-26 DIAGNOSIS — I129 Hypertensive chronic kidney disease with stage 1 through stage 4 chronic kidney disease, or unspecified chronic kidney disease: Secondary | ICD-10-CM | POA: Diagnosis not present

## 2017-05-26 DIAGNOSIS — L97812 Non-pressure chronic ulcer of other part of right lower leg with fat layer exposed: Secondary | ICD-10-CM | POA: Diagnosis not present

## 2017-05-26 DIAGNOSIS — N183 Chronic kidney disease, stage 3 (moderate): Secondary | ICD-10-CM | POA: Diagnosis not present

## 2017-06-02 ENCOUNTER — Encounter (HOSPITAL_BASED_OUTPATIENT_CLINIC_OR_DEPARTMENT_OTHER): Payer: Medicare Other | Attending: Internal Medicine

## 2017-06-02 DIAGNOSIS — L97812 Non-pressure chronic ulcer of other part of right lower leg with fat layer exposed: Secondary | ICD-10-CM | POA: Insufficient documentation

## 2017-06-02 DIAGNOSIS — I87323 Chronic venous hypertension (idiopathic) with inflammation of bilateral lower extremity: Secondary | ICD-10-CM | POA: Diagnosis not present

## 2017-06-02 DIAGNOSIS — S81801A Unspecified open wound, right lower leg, initial encounter: Secondary | ICD-10-CM | POA: Diagnosis not present

## 2017-06-09 DIAGNOSIS — I87323 Chronic venous hypertension (idiopathic) with inflammation of bilateral lower extremity: Secondary | ICD-10-CM | POA: Diagnosis not present

## 2017-06-09 DIAGNOSIS — S81801A Unspecified open wound, right lower leg, initial encounter: Secondary | ICD-10-CM | POA: Diagnosis not present

## 2017-06-09 DIAGNOSIS — L97812 Non-pressure chronic ulcer of other part of right lower leg with fat layer exposed: Secondary | ICD-10-CM | POA: Diagnosis not present

## 2017-06-16 DIAGNOSIS — L97812 Non-pressure chronic ulcer of other part of right lower leg with fat layer exposed: Secondary | ICD-10-CM | POA: Diagnosis not present

## 2017-06-16 DIAGNOSIS — S81801A Unspecified open wound, right lower leg, initial encounter: Secondary | ICD-10-CM | POA: Diagnosis not present

## 2017-06-16 DIAGNOSIS — I87323 Chronic venous hypertension (idiopathic) with inflammation of bilateral lower extremity: Secondary | ICD-10-CM | POA: Diagnosis not present

## 2017-06-23 DIAGNOSIS — S81811A Laceration without foreign body, right lower leg, initial encounter: Secondary | ICD-10-CM | POA: Diagnosis not present

## 2017-06-23 DIAGNOSIS — L97812 Non-pressure chronic ulcer of other part of right lower leg with fat layer exposed: Secondary | ICD-10-CM | POA: Diagnosis not present

## 2017-06-23 DIAGNOSIS — I87323 Chronic venous hypertension (idiopathic) with inflammation of bilateral lower extremity: Secondary | ICD-10-CM | POA: Diagnosis not present

## 2017-06-25 DIAGNOSIS — I87323 Chronic venous hypertension (idiopathic) with inflammation of bilateral lower extremity: Secondary | ICD-10-CM | POA: Diagnosis not present

## 2017-06-25 DIAGNOSIS — L97812 Non-pressure chronic ulcer of other part of right lower leg with fat layer exposed: Secondary | ICD-10-CM | POA: Diagnosis not present

## 2017-06-30 ENCOUNTER — Encounter (HOSPITAL_BASED_OUTPATIENT_CLINIC_OR_DEPARTMENT_OTHER): Payer: Medicare Other | Attending: Internal Medicine

## 2017-06-30 DIAGNOSIS — I10 Essential (primary) hypertension: Secondary | ICD-10-CM | POA: Diagnosis not present

## 2017-06-30 DIAGNOSIS — X58XXXA Exposure to other specified factors, initial encounter: Secondary | ICD-10-CM | POA: Insufficient documentation

## 2017-06-30 DIAGNOSIS — S81811A Laceration without foreign body, right lower leg, initial encounter: Secondary | ICD-10-CM | POA: Insufficient documentation

## 2017-06-30 DIAGNOSIS — I739 Peripheral vascular disease, unspecified: Secondary | ICD-10-CM | POA: Diagnosis not present

## 2017-06-30 DIAGNOSIS — I87323 Chronic venous hypertension (idiopathic) with inflammation of bilateral lower extremity: Secondary | ICD-10-CM | POA: Diagnosis not present

## 2017-06-30 DIAGNOSIS — S81801A Unspecified open wound, right lower leg, initial encounter: Secondary | ICD-10-CM | POA: Diagnosis not present

## 2017-07-06 DIAGNOSIS — M1990 Unspecified osteoarthritis, unspecified site: Secondary | ICD-10-CM | POA: Diagnosis not present

## 2017-07-06 DIAGNOSIS — J449 Chronic obstructive pulmonary disease, unspecified: Secondary | ICD-10-CM | POA: Diagnosis not present

## 2017-07-06 DIAGNOSIS — N183 Chronic kidney disease, stage 3 (moderate): Secondary | ICD-10-CM | POA: Diagnosis not present

## 2017-07-06 DIAGNOSIS — I7389 Other specified peripheral vascular diseases: Secondary | ICD-10-CM | POA: Diagnosis not present

## 2017-07-06 DIAGNOSIS — I10 Essential (primary) hypertension: Secondary | ICD-10-CM | POA: Diagnosis not present

## 2017-07-06 DIAGNOSIS — S81819D Laceration without foreign body, unspecified lower leg, subsequent encounter: Secondary | ICD-10-CM | POA: Diagnosis not present

## 2017-07-06 DIAGNOSIS — Z6829 Body mass index (BMI) 29.0-29.9, adult: Secondary | ICD-10-CM | POA: Diagnosis not present

## 2017-07-07 DIAGNOSIS — I87311 Chronic venous hypertension (idiopathic) with ulcer of right lower extremity: Secondary | ICD-10-CM | POA: Diagnosis not present

## 2017-07-07 DIAGNOSIS — I87323 Chronic venous hypertension (idiopathic) with inflammation of bilateral lower extremity: Secondary | ICD-10-CM | POA: Diagnosis not present

## 2017-07-07 DIAGNOSIS — I739 Peripheral vascular disease, unspecified: Secondary | ICD-10-CM | POA: Diagnosis not present

## 2017-07-07 DIAGNOSIS — L97812 Non-pressure chronic ulcer of other part of right lower leg with fat layer exposed: Secondary | ICD-10-CM | POA: Diagnosis not present

## 2017-07-07 DIAGNOSIS — I10 Essential (primary) hypertension: Secondary | ICD-10-CM | POA: Diagnosis not present

## 2017-07-07 DIAGNOSIS — S81811A Laceration without foreign body, right lower leg, initial encounter: Secondary | ICD-10-CM | POA: Diagnosis not present

## 2017-07-14 DIAGNOSIS — S81811A Laceration without foreign body, right lower leg, initial encounter: Secondary | ICD-10-CM | POA: Diagnosis not present

## 2017-07-14 DIAGNOSIS — I739 Peripheral vascular disease, unspecified: Secondary | ICD-10-CM | POA: Diagnosis not present

## 2017-07-14 DIAGNOSIS — I10 Essential (primary) hypertension: Secondary | ICD-10-CM | POA: Diagnosis not present

## 2017-07-14 DIAGNOSIS — I87312 Chronic venous hypertension (idiopathic) with ulcer of left lower extremity: Secondary | ICD-10-CM | POA: Diagnosis not present

## 2017-07-14 DIAGNOSIS — I87323 Chronic venous hypertension (idiopathic) with inflammation of bilateral lower extremity: Secondary | ICD-10-CM | POA: Diagnosis not present

## 2017-07-14 DIAGNOSIS — L97812 Non-pressure chronic ulcer of other part of right lower leg with fat layer exposed: Secondary | ICD-10-CM | POA: Diagnosis not present

## 2017-07-23 DIAGNOSIS — I739 Peripheral vascular disease, unspecified: Secondary | ICD-10-CM | POA: Diagnosis not present

## 2017-07-23 DIAGNOSIS — L97812 Non-pressure chronic ulcer of other part of right lower leg with fat layer exposed: Secondary | ICD-10-CM | POA: Diagnosis not present

## 2017-07-23 DIAGNOSIS — I10 Essential (primary) hypertension: Secondary | ICD-10-CM | POA: Diagnosis not present

## 2017-07-23 DIAGNOSIS — I87323 Chronic venous hypertension (idiopathic) with inflammation of bilateral lower extremity: Secondary | ICD-10-CM | POA: Diagnosis not present

## 2017-07-23 DIAGNOSIS — S81811A Laceration without foreign body, right lower leg, initial encounter: Secondary | ICD-10-CM | POA: Diagnosis not present

## 2017-07-28 ENCOUNTER — Encounter (HOSPITAL_BASED_OUTPATIENT_CLINIC_OR_DEPARTMENT_OTHER): Payer: Medicare Other | Attending: Internal Medicine

## 2017-07-28 DIAGNOSIS — I10 Essential (primary) hypertension: Secondary | ICD-10-CM | POA: Insufficient documentation

## 2017-07-28 DIAGNOSIS — Z09 Encounter for follow-up examination after completed treatment for conditions other than malignant neoplasm: Secondary | ICD-10-CM | POA: Insufficient documentation

## 2017-07-28 DIAGNOSIS — I739 Peripheral vascular disease, unspecified: Secondary | ICD-10-CM | POA: Insufficient documentation

## 2017-08-04 DIAGNOSIS — Z09 Encounter for follow-up examination after completed treatment for conditions other than malignant neoplasm: Secondary | ICD-10-CM | POA: Diagnosis not present

## 2017-08-04 DIAGNOSIS — I10 Essential (primary) hypertension: Secondary | ICD-10-CM | POA: Diagnosis not present

## 2017-08-04 DIAGNOSIS — L97819 Non-pressure chronic ulcer of other part of right lower leg with unspecified severity: Secondary | ICD-10-CM | POA: Diagnosis not present

## 2017-08-04 DIAGNOSIS — I739 Peripheral vascular disease, unspecified: Secondary | ICD-10-CM | POA: Diagnosis not present

## 2017-08-04 DIAGNOSIS — I872 Venous insufficiency (chronic) (peripheral): Secondary | ICD-10-CM | POA: Diagnosis not present

## 2017-10-07 DIAGNOSIS — H353121 Nonexudative age-related macular degeneration, left eye, early dry stage: Secondary | ICD-10-CM | POA: Diagnosis not present

## 2017-10-07 DIAGNOSIS — H52203 Unspecified astigmatism, bilateral: Secondary | ICD-10-CM | POA: Diagnosis not present

## 2017-10-07 DIAGNOSIS — H0100A Unspecified blepharitis right eye, upper and lower eyelids: Secondary | ICD-10-CM | POA: Diagnosis not present

## 2017-10-07 DIAGNOSIS — H353212 Exudative age-related macular degeneration, right eye, with inactive choroidal neovascularization: Secondary | ICD-10-CM | POA: Diagnosis not present

## 2017-10-15 DIAGNOSIS — R0609 Other forms of dyspnea: Secondary | ICD-10-CM | POA: Diagnosis not present

## 2017-10-15 DIAGNOSIS — J449 Chronic obstructive pulmonary disease, unspecified: Secondary | ICD-10-CM | POA: Diagnosis not present

## 2017-10-15 DIAGNOSIS — Z6829 Body mass index (BMI) 29.0-29.9, adult: Secondary | ICD-10-CM | POA: Diagnosis not present

## 2017-11-04 ENCOUNTER — Other Ambulatory Visit: Payer: Self-pay

## 2017-11-04 ENCOUNTER — Emergency Department (HOSPITAL_BASED_OUTPATIENT_CLINIC_OR_DEPARTMENT_OTHER)
Admission: EM | Admit: 2017-11-04 | Discharge: 2017-11-04 | Disposition: A | Discharge status: Home / Self Care | Payer: Medicare Other | Attending: Emergency Medicine | Admitting: Emergency Medicine

## 2017-11-04 ENCOUNTER — Emergency Department (HOSPITAL_BASED_OUTPATIENT_CLINIC_OR_DEPARTMENT_OTHER): Payer: Medicare Other

## 2017-11-04 ENCOUNTER — Encounter (HOSPITAL_BASED_OUTPATIENT_CLINIC_OR_DEPARTMENT_OTHER): Payer: Self-pay

## 2017-11-04 DIAGNOSIS — Y9389 Activity, other specified: Secondary | ICD-10-CM | POA: Insufficient documentation

## 2017-11-04 DIAGNOSIS — N183 Chronic kidney disease, stage 3 (moderate): Secondary | ICD-10-CM | POA: Insufficient documentation

## 2017-11-04 DIAGNOSIS — W19XXXA Unspecified fall, initial encounter: Secondary | ICD-10-CM

## 2017-11-04 DIAGNOSIS — Z87891 Personal history of nicotine dependence: Secondary | ICD-10-CM | POA: Insufficient documentation

## 2017-11-04 DIAGNOSIS — S81012A Laceration without foreign body, left knee, initial encounter: Secondary | ICD-10-CM | POA: Diagnosis not present

## 2017-11-04 DIAGNOSIS — Z7982 Long term (current) use of aspirin: Secondary | ICD-10-CM | POA: Insufficient documentation

## 2017-11-04 DIAGNOSIS — Y999 Unspecified external cause status: Secondary | ICD-10-CM | POA: Insufficient documentation

## 2017-11-04 DIAGNOSIS — J449 Chronic obstructive pulmonary disease, unspecified: Secondary | ICD-10-CM | POA: Diagnosis not present

## 2017-11-04 DIAGNOSIS — W010XXA Fall on same level from slipping, tripping and stumbling without subsequent striking against object, initial encounter: Secondary | ICD-10-CM | POA: Insufficient documentation

## 2017-11-04 DIAGNOSIS — I129 Hypertensive chronic kidney disease with stage 1 through stage 4 chronic kidney disease, or unspecified chronic kidney disease: Secondary | ICD-10-CM | POA: Insufficient documentation

## 2017-11-04 DIAGNOSIS — Z79899 Other long term (current) drug therapy: Secondary | ICD-10-CM | POA: Diagnosis not present

## 2017-11-04 DIAGNOSIS — Z96651 Presence of right artificial knee joint: Secondary | ICD-10-CM | POA: Diagnosis not present

## 2017-11-04 DIAGNOSIS — Y92009 Unspecified place in unspecified non-institutional (private) residence as the place of occurrence of the external cause: Secondary | ICD-10-CM | POA: Diagnosis not present

## 2017-11-04 MED ORDER — ACETAMINOPHEN 325 MG PO TABS
650.0000 mg | ORAL_TABLET | Freq: Once | ORAL | Status: AC
  Administered 2017-11-04: 650 mg via ORAL
  Filled 2017-11-04: qty 2

## 2017-11-04 MED ORDER — LIDOCAINE-EPINEPHRINE (PF) 2 %-1:200000 IJ SOLN
20.0000 mL | Freq: Once | INTRAMUSCULAR | Status: AC
  Administered 2017-11-04: 20 mL
  Filled 2017-11-04: qty 20

## 2017-11-04 MED ORDER — LIDOCAINE-EPINEPHRINE (PF) 2 %-1:200000 IJ SOLN
INTRAMUSCULAR | Status: AC
  Filled 2017-11-04: qty 20

## 2017-11-04 NOTE — ED Notes (Signed)
ED Provider at bedside. 

## 2017-11-04 NOTE — ED Notes (Signed)
Patient transported to X-ray 

## 2017-11-04 NOTE — Discharge Instructions (Signed)
You were seen in the emergency department after a fall.  The x-ray of your left knee did not show any fractures or dislocations.  It did show that you have a joint effusion otherwise known as fluid in the joint due to the fall.  The laceration to your left knee was repaired with stitches.  There are a total of 17 stitches in place.  For the next 24 hours please do not get this area wet, after 24 hours you may get the area wet but do not soak it.  We recommend that you remain in the knee immobilizer while the stitches are in to prevent them from coming out and causing the wound to reopen.  Please keep this area clean and dry.  You may apply antibiotic ointment per over-the-counter instructions.  The stitches will need to come out and the wound will need to be rechecked in 8 to 10 days.  You may have the stitches removed in the emergency department, at an urgent care, or by your primary care provider.  Return to the ER sooner for signs of infection including but not limited to redness around the area, drainage from the wound, fevers, or any other concerns that you may have.  Additionally if your toes begin to feel numb or tingling, appear blue or gray, please remove the splint and return to the ER immediately.

## 2017-11-04 NOTE — ED Notes (Signed)
Pt denies LOC, reports tripping over shoes when trying to rush to the phone. Pt reports knee replacement in that knee- hx of injuries to left leg.

## 2017-11-04 NOTE — ED Triage Notes (Signed)
Left knee pain and swelling after tripping and falling around 1300 when trying to answer the phone, pt walking slowly, refused wheelchair

## 2017-11-04 NOTE — ED Provider Notes (Signed)
[facility] EMERGENCY DEPARTMENT Provider Note   CSN: [id number] Arrival date & time: 11/04/17  1526     History   Chief Complaint Chief Complaint  Patient presents with  . Fall    HPI Judith Boone is a 84 y.o. female with a hx of COPD, GERD, HTN, hyperlipidemia, stage III CKD, and bilateral knee replacements who presents to the ED s/p mechanical fall at 1300 today. Patient states she was walking with her shoes untied, the phone rang, she rushed to get the phone and tripped over her shoe laces. She states she fell onto the L knee resulting in a laceration to this area. Denies head injury or LOC. She was able to get up and ambulate on her own. States she is having pain only to the L knee, rates pain a 6/10 in severity, worse with movement of the knee and with bearing weight. No meds PTA. Last tetanus in 2016.  Denies headache, neck pain, back pain, numbness, weakness, or any other areas of concern.  Patient states she takes a baby aspirin, otherwise no blood thinners.  HPI  Past Medical History:  Diagnosis Date  . Allergy    allergic rhinitis  . Arthritis   . Cellulitis of left leg 07-2014   hospitalized for 4 days  . COPD (chronic obstructive pulmonary disease) ([place])   . GERD (gastroesophageal reflux disease)   . History of granulomatous disease ([place])   . Hyperlipidemia   . Hypertension   . Left leg swelling   . Right-sided low back pain with sciatica     Patient Active Problem List   Diagnosis Date Noted  . CKD (chronic kidney disease) stage 3, GFR 30-59 ml/min ([facility]) 08/17/2014  . Sepsis ([place]) 08/17/2014  . Cellulitis of leg, left 08/16/2014  . Pain of great toe 03/23/2012  . Leg swelling 03/23/2012  . Decreased hearing 12/23/2011  . Renal insufficiency 09/27/2011  . Insomnia 05/16/2011  . Radicular leg pain 05/16/2011  . Hearing loss 12/12/2010  . HTN (hypertension) 07/02/2010  . Other and unspecified hyperlipidemia 07/02/2010  . Arthritis 07/02/2010  .  GERD (gastroesophageal reflux disease) 07/02/2010  . Esophageal web 07/02/2010  . PAD (peripheral artery disease) ([place]) 07/02/2010    Past Surgical History:  Procedure Laterality Date  . ABDOMINAL HYSTERECTOMY    . broken ankle repair     left  . CATARACT EXTRACTION    . TOTAL KNEE ARTHROPLASTY     right     OB History   None      Home Medications    Prior to Admission medications   Medication Sig Start Date End Date Taking? Authorizing Provider  amLODipine (NORVASC) 5 MG tablet Take 5 mg by mouth daily. 04/13/15   [provider]  Ascorbic Acid (VITAMIN C) 500 MG tablet Take 500 mg by mouth daily. 2 per day     [provider]  aspirin 81 MG tablet Take 81 mg by mouth 3 (three) times a week. No specific day    [provider]  atorvastatin (LIPITOR) 10 MG tablet Take 10 mg by mouth every other day. 03/17/15   [provider]  calcium carbonate (OS-CAL) 600 MG TABS Take 600 mg by mouth 2 (two) times daily with a meal.      [provider]  cephALEXin (KEFLEX) 500 MG capsule Take 1 capsule (500 mg total) by mouth 4 (four) times daily. Patient not taking: Reported on 01/08/2017 12/05/16   Isla Pence, MD  Cholecalciferol (VITAMIN D3) 1000 UNITS CAPS Take 1 capsule by mouth 2 (two) times daily. 2 per day    [provider]  CRESTOR 5 MG tablet TAKE 1 TALET TWICE A WEEK Patient not taking: Reported on 01/08/2017 07/23/12   Mosie Lukes, MD  Esomeprazole Magnesium (NEXIUM PO) Take by mouth daily.    [provider]  gabapentin (NEURONTIN) 100 MG capsule Take 100 mg by mouth 3 (three) times daily.    [provider]  hydrALAZINE (APRESOLINE) 25 MG tablet Take 25 mg by mouth 2 (two) times daily.     [provider]  loratadine (CLARITIN) 10 MG tablet Take 10 mg by mouth daily.    [provider]  Multiple Vitamins-Minerals ([employer] SPECTRAVITE PO) Take 1 tablet by mouth daily.     [provider]  omeprazole (PRILOSEC) 20 MG capsule  05/22/12   [provider]  pramipexole (MIRAPEX) 0.125 MG tablet Take 0.125 mg by mouth at bedtime. 02/09/15   [provider]  quinapril (ACCUPRIL) 40 MG tablet Take 40 mg by mouth at bedtime.    [provider]  saccharomyces boulardii (FLORASTOR) 250 MG capsule Take 1 capsule (250 mg total) by mouth 2 (two) times daily. 08/20/14   Thurnell Lose, MD  traMADol (ULTRAM) 50 MG tablet Take 50 mg by mouth at bedtime.    [provider]  VENTOLIN HFA 108 (90 BASE) MCG/ACT inhaler USE 1 TO 2 PUFFS EVERY 8 HOURS AS NEEDED FOR SHORTNESS OF BREATH 03/03/15   [provider]    Family History Family History  Problem Relation Age of Onset  . Alcohol abuse Father   . Heart disease Father   . Hypertension Father   . Hypertension Daughter   . Hypertension Son     Social History Social History   Tobacco Use  . Smoking status: Former Smoker    Last attempt to quit: 04/29/1979    Years since quitting: 38.5  . Smokeless tobacco: Never Used  Substance Use Topics  . Alcohol use: Yes    Alcohol/week: 0.0 oz  . Drug use: No     Allergies   Bacitracin; Neosporin [neomycin-bacitracin zn-polymyx]; and Polysporin [bacitracin-polymyxin b]   Review of Systems Review of Systems  Constitutional: Negative for chills and fever.  Eyes: Negative for visual disturbance.  Respiratory: Negative for shortness of breath.   Cardiovascular: Negative for chest pain.  Gastrointestinal: Negative for abdominal pain.  Musculoskeletal: Positive for arthralgias (L knee). Negative for back pain and neck pain.  Skin: Positive for wound.  Neurological: Negative for dizziness, weakness, numbness and headaches.   Physical Exam Updated Vital Signs BP (!) 157/68 (BP Location: Left Arm)   Pulse (!) 104   Temp 97.9 F (36.6 C) (Oral)   Resp 18   Ht 5' (1.524 m)   Wt 62.6 kg (138 lb)   SpO2 96%   BMI 26.95 kg/m    Physical Exam  Constitutional: She appears well-developed and well-nourished.  Non-toxic appearance. No distress.  HENT:  Head: Normocephalic and atraumatic. Head is without raccoon's eyes and without Battle's sign.  Right Ear: No drainage. No hemotympanum.  Left Ear: No drainage. No hemotympanum.  Nose: Nose normal. No rhinorrhea.  Mouth/Throat: Uvula is midline and oropharynx is clear and moist.  Eyes: Pupils are equal, round, and reactive to light. Conjunctivae and EOM are normal. Right eye exhibits no discharge. Left eye exhibits no discharge.  Neck: Normal range of motion. Neck supple.  No spinous process tenderness present.  Cardiovascular: Normal rate and regular rhythm.  No murmur heard. Pulses:      Dorsalis pedis pulses are 2+ on the right side, and 2+ on the left side.  Pulmonary/Chest: Breath sounds normal. No respiratory distress. She has no wheezes. She has no rhonchi. She has no rales.  Abdominal: Soft. She exhibits no distension. There is no tenderness.  Musculoskeletal:  Patient has a skin tear/laceration to the left anterior knee measuring approximately 10 cm.  Pictured below.  No active bleeding.  No evidence of foreign body.  Patient does have swelling with effusion to the left knee as well as ecchymosis just superior and inferior to the laceration area.  Otherwise no appreciable swelling, obvious deformity, erythema, ecchymosis, or open wounds.  Upper extremities: normal ROM. Nontender.  Back: no midline tenderness.  Lower extremities: normal ROM to all joints with the exception of limited L knee flexion secondary to pain, she is able to flex to about 90 degrees. She has full extension of this knee. Knee is diffusely tender anteriorly, especially in area of laceration. Otherwise nontender, no point/focal bony tenderness.  Neurological:  Alert. Clear speech. CNIII-XII grossly intact. Sensation grossly intact x 4. 5/5 symmetric grip strength. 5/5 strength with  plantar/dorsiflexion bilaterally. Gait intact but antalgic.   Skin: Skin is warm and dry. No rash noted.  Psychiatric: She has a normal mood and affect. Her behavior is normal.  Nursing note and vitals reviewed.      ED Treatments / Results  Labs (all labs ordered are listed, but only abnormal results are displayed) Labs Reviewed - No data to display  EKG None  Radiology Dg Knee Complete 4 Views Left  Result Date: 11/04/2017 CLINICAL DATA:  Left knee pain and lacerations secondary to a fall today. Previous total knee replacement. EXAM: LEFT KNEE - COMPLETE 4+ VIEW COMPARISON:  Radiographs dated 01/08/2011 FINDINGS: There is no fracture or dislocation. The components of the total knee prosthesis appear in good position with no evidence of loosening. There is a joint effusion. IMPRESSION: No acute bone abnormality.  Joint effusion. Electronically Signed   By: Lorriane Shire M.D.   On: 11/04/2017 16:15    Procedures .Marland KitchenLaceration Repair Date/Time: 11/04/2017 6:21 PM Performed by: Amaryllis Dyke, PA-C Authorized by: Amaryllis Dyke, PA-C   Consent:    Consent obtained:  Verbal   Consent given by:  Patient   Risks discussed:  Infection, need for additional repair, nerve damage, poor wound healing, poor cosmetic result, pain, retained foreign body, tendon damage and vascular damage   Alternatives discussed:  No treatment Anesthesia (see MAR for exact dosages):    Anesthesia method:  Local infiltration   Local anesthetic:  Lidocaine 2% w/o epi Laceration details:    Location:  Leg   Leg location:  L knee   Length (cm):  10 Repair type:    Repair type:  Simple Pre-procedure details:    Preparation:  Patient was prepped and draped in usual sterile fashion and imaging obtained to evaluate for foreign bodies Exploration:    Hemostasis achieved with:  Direct pressure   Wound exploration: wound explored through full range of motion and entire depth of wound probed and  visualized     Wound extent comment:  No intrarticular involvement   Contaminated: no   Treatment:    Area cleansed with:  Betadine   Amount of cleaning:  Standard   Irrigation solution:  Sterile water   Irrigation volume:  1 L   Irrigation method:  Pressure wash Skin repair:    Repair method:  Sutures   Suture size:  4-0   Suture material:  Nylon   Suture technique:  Simple interrupted and horizontal mattress   Number of sutures: 15 simple interrupted sutures placed with 2 horizontal mattress sutures centrally to distal 1/3rds bilaterally. Approximation:    Approximation:  Close Post-procedure details:    Dressing:  Antibiotic ointment, non-adherent dressing and splint for protection   Patient tolerance of procedure:  Tolerated well, no immediate complications   (including critical care time)     Medications Ordered in ED Medications  lidocaine-EPINEPHrine (XYLOCAINE W/EPI) 2 %-1:200000 (PF) injection 20 mL (20 mLs Infiltration Given 11/04/17 1634)  acetaminophen (TYLENOL) tablet 650 mg (650 mg Oral Given 11/04/17 1634)     Initial Impression / Assessment and Plan / ED Course  I have reviewed the triage vital signs and the nursing notes.  Pertinent labs & imaging results that were available during my care of the patient were reviewed by me and considered in my medical decision making (see chart for details).   Patient presents to the ED s/p mechanical fall with L knee pain and skin tear/laceration. Patient nontoxic appearing, mildly tachycardic upon arrival, improved on my exam and with repeat vitals. No evidence of serious head/neck/back injury. X-ray of L knee reveals joint effusion, no evidence of fracture/dislocation or hardware malalignment. Patient's wound appears fairly clean. Pressure irrigation performed. Wound explored and base of wound visualized in a bloodless field without evidence of foreign body- confirmed on imaging, additionally does not appear to have  intra-articular capsule involvement. Laceration repaired per procedure note and image above. Tolerated well. NVI distally pre and post procedure. Tetanus has been within past 5 years.  Patient has no comorbidities to effect normal wound healing. Discussed suture home care with patient and answered questions. Patient to follow-up for wound check and suture removal in 8-10 days. I discussed results, treatment plan, need for follow-up, and return precautions with the patient. Provided opportunity for questions, patient confirmed understanding and is in agreement with plan.   Findings and plan of care discussed with supervising physician Dr. Regenia Skeeter who personally evaluated and examined this patient and is in agreement with plan.    Final Clinical Impressions(s) / ED Diagnoses   Final diagnoses:  Fall, initial encounter  Laceration of left knee, initial encounter    ED Discharge Orders    None       Leafy Kindle 11/04/17 Mena Goes, MD 11/06/17 1301

## 2017-11-16 DIAGNOSIS — S81012A Laceration without foreign body, left knee, initial encounter: Secondary | ICD-10-CM | POA: Diagnosis not present

## 2017-11-16 DIAGNOSIS — Z4802 Encounter for removal of sutures: Secondary | ICD-10-CM | POA: Diagnosis not present

## 2017-11-19 DIAGNOSIS — H43813 Vitreous degeneration, bilateral: Secondary | ICD-10-CM | POA: Diagnosis not present

## 2017-11-19 DIAGNOSIS — H353121 Nonexudative age-related macular degeneration, left eye, early dry stage: Secondary | ICD-10-CM | POA: Diagnosis not present

## 2017-11-19 DIAGNOSIS — H35051 Retinal neovascularization, unspecified, right eye: Secondary | ICD-10-CM | POA: Diagnosis not present

## 2018-01-13 DIAGNOSIS — E7849 Other hyperlipidemia: Secondary | ICD-10-CM | POA: Diagnosis not present

## 2018-01-13 DIAGNOSIS — M859 Disorder of bone density and structure, unspecified: Secondary | ICD-10-CM | POA: Diagnosis not present

## 2018-01-13 DIAGNOSIS — N183 Chronic kidney disease, stage 3 (moderate): Secondary | ICD-10-CM | POA: Diagnosis not present

## 2018-01-20 DIAGNOSIS — Z1389 Encounter for screening for other disorder: Secondary | ICD-10-CM | POA: Diagnosis not present

## 2018-01-20 DIAGNOSIS — N183 Chronic kidney disease, stage 3 (moderate): Secondary | ICD-10-CM | POA: Diagnosis not present

## 2018-01-20 DIAGNOSIS — Z23 Encounter for immunization: Secondary | ICD-10-CM | POA: Diagnosis not present

## 2018-01-20 DIAGNOSIS — I10 Essential (primary) hypertension: Secondary | ICD-10-CM | POA: Diagnosis not present

## 2018-01-20 DIAGNOSIS — I7389 Other specified peripheral vascular diseases: Secondary | ICD-10-CM | POA: Diagnosis not present

## 2018-01-20 DIAGNOSIS — Z6827 Body mass index (BMI) 27.0-27.9, adult: Secondary | ICD-10-CM | POA: Diagnosis not present

## 2018-01-20 DIAGNOSIS — Z0000 Encounter for general adult medical examination without abnormal findings: Secondary | ICD-10-CM | POA: Diagnosis not present

## 2018-01-20 DIAGNOSIS — R82998 Other abnormal findings in urine: Secondary | ICD-10-CM | POA: Diagnosis not present

## 2018-01-20 DIAGNOSIS — I129 Hypertensive chronic kidney disease with stage 1 through stage 4 chronic kidney disease, or unspecified chronic kidney disease: Secondary | ICD-10-CM | POA: Diagnosis not present

## 2018-01-20 DIAGNOSIS — M859 Disorder of bone density and structure, unspecified: Secondary | ICD-10-CM | POA: Diagnosis not present

## 2018-01-20 DIAGNOSIS — J449 Chronic obstructive pulmonary disease, unspecified: Secondary | ICD-10-CM | POA: Diagnosis not present

## 2018-01-20 DIAGNOSIS — K219 Gastro-esophageal reflux disease without esophagitis: Secondary | ICD-10-CM | POA: Diagnosis not present

## 2018-01-20 DIAGNOSIS — I872 Venous insufficiency (chronic) (peripheral): Secondary | ICD-10-CM | POA: Diagnosis not present

## 2018-01-20 DIAGNOSIS — E7849 Other hyperlipidemia: Secondary | ICD-10-CM | POA: Diagnosis not present

## 2018-01-21 DIAGNOSIS — L821 Other seborrheic keratosis: Secondary | ICD-10-CM | POA: Diagnosis not present

## 2018-01-21 DIAGNOSIS — Z85828 Personal history of other malignant neoplasm of skin: Secondary | ICD-10-CM | POA: Diagnosis not present

## 2018-01-21 DIAGNOSIS — D485 Neoplasm of uncertain behavior of skin: Secondary | ICD-10-CM | POA: Diagnosis not present

## 2018-01-21 DIAGNOSIS — L817 Pigmented purpuric dermatosis: Secondary | ICD-10-CM | POA: Diagnosis not present

## 2018-01-21 DIAGNOSIS — L218 Other seborrheic dermatitis: Secondary | ICD-10-CM | POA: Diagnosis not present

## 2018-01-21 DIAGNOSIS — L718 Other rosacea: Secondary | ICD-10-CM | POA: Diagnosis not present

## 2018-01-21 DIAGNOSIS — D044 Carcinoma in situ of skin of scalp and neck: Secondary | ICD-10-CM | POA: Diagnosis not present

## 2018-01-21 DIAGNOSIS — Z1212 Encounter for screening for malignant neoplasm of rectum: Secondary | ICD-10-CM | POA: Diagnosis not present

## 2018-07-17 ENCOUNTER — Emergency Department (HOSPITAL_COMMUNITY)
Admission: EM | Admit: 2018-07-17 | Discharge: 2018-07-18 | Disposition: A | Discharge status: Home / Self Care | Payer: Medicare Other | Attending: Emergency Medicine | Admitting: Emergency Medicine

## 2018-07-17 ENCOUNTER — Emergency Department (HOSPITAL_COMMUNITY): Payer: Medicare Other

## 2018-07-17 ENCOUNTER — Encounter (HOSPITAL_COMMUNITY): Payer: Self-pay | Admitting: Emergency Medicine

## 2018-07-17 DIAGNOSIS — N183 Chronic kidney disease, stage 3 (moderate): Secondary | ICD-10-CM | POA: Diagnosis not present

## 2018-07-17 DIAGNOSIS — I4891 Unspecified atrial fibrillation: Secondary | ICD-10-CM | POA: Diagnosis not present

## 2018-07-17 DIAGNOSIS — Z87891 Personal history of nicotine dependence: Secondary | ICD-10-CM | POA: Insufficient documentation

## 2018-07-17 DIAGNOSIS — I129 Hypertensive chronic kidney disease with stage 1 through stage 4 chronic kidney disease, or unspecified chronic kidney disease: Secondary | ICD-10-CM | POA: Insufficient documentation

## 2018-07-17 DIAGNOSIS — E785 Hyperlipidemia, unspecified: Secondary | ICD-10-CM | POA: Diagnosis not present

## 2018-07-17 DIAGNOSIS — I10 Essential (primary) hypertension: Secondary | ICD-10-CM | POA: Diagnosis not present

## 2018-07-17 DIAGNOSIS — R002 Palpitations: Secondary | ICD-10-CM | POA: Diagnosis present

## 2018-07-17 DIAGNOSIS — R000 Tachycardia, unspecified: Secondary | ICD-10-CM | POA: Diagnosis not present

## 2018-07-17 DIAGNOSIS — Z79899 Other long term (current) drug therapy: Secondary | ICD-10-CM | POA: Diagnosis not present

## 2018-07-17 DIAGNOSIS — J449 Chronic obstructive pulmonary disease, unspecified: Secondary | ICD-10-CM | POA: Diagnosis not present

## 2018-07-17 DIAGNOSIS — I499 Cardiac arrhythmia, unspecified: Secondary | ICD-10-CM | POA: Diagnosis not present

## 2018-07-17 LAB — CBC
HCT: 40.2 % (ref 36.0–46.0)
Hemoglobin: 12.3 g/dL (ref 12.0–15.0)
MCH: 27 pg (ref 26.0–34.0)
MCHC: 30.6 g/dL (ref 30.0–36.0)
MCV: 88.2 fL (ref 80.0–100.0)
Platelets: 183 10*3/uL (ref 150–400)
RBC: 4.56 MIL/uL (ref 3.87–5.11)
RDW: 20.5 % — ABNORMAL HIGH (ref 11.5–15.5)
WBC: 6.7 10*3/uL (ref 4.0–10.5)
nRBC: 0 % (ref 0.0–0.2)

## 2018-07-17 LAB — BASIC METABOLIC PANEL
Anion gap: 8 (ref 5–15)
BUN: 37 mg/dL — ABNORMAL HIGH (ref 8–23)
CO2: 18 mmol/L — ABNORMAL LOW (ref 22–32)
Calcium: 8.9 mg/dL (ref 8.9–10.3)
Chloride: 114 mmol/L — ABNORMAL HIGH (ref 98–111)
Creatinine, Ser: 1.78 mg/dL — ABNORMAL HIGH (ref 0.44–1.00)
GFR calc Af Amer: 30 mL/min — ABNORMAL LOW (ref >60)
GFR calc non Af Amer: 26 mL/min — ABNORMAL LOW (ref >60)
Glucose, Bld: 138 mg/dL — ABNORMAL HIGH (ref 70–99)
Potassium: 4.4 mmol/L (ref 3.5–5.1)
Sodium: 140 mmol/L (ref 135–145)

## 2018-07-17 LAB — MAGNESIUM: Magnesium: 2 mg/dL (ref 1.7–2.4)

## 2018-07-17 MED ORDER — METOPROLOL TARTRATE 5 MG/5ML IV SOLN
2.5000 mg | INTRAVENOUS | Status: DC | PRN
  Filled 2018-07-17: qty 5

## 2018-07-17 MED ORDER — METOPROLOL TARTRATE 25 MG PO TABS
25.0000 mg | ORAL_TABLET | Freq: Once | ORAL | Status: AC
  Administered 2018-07-17: 25 mg via ORAL
  Filled 2018-07-17: qty 1

## 2018-07-17 MED ORDER — SODIUM CHLORIDE 0.9 % IV BOLUS
500.0000 mL | Freq: Once | INTRAVENOUS | Status: AC
  Administered 2018-07-18: 500 mL via INTRAVENOUS

## 2018-07-17 NOTE — ED Triage Notes (Signed)
Pt to ED via [facility] with c/o rapid heart rate.  Pt st's she was sitting and reading when her I Phone started dinging telling her that her heart rate was fast.  On EMS arrival pt's heart rate was 120 - 160 A Fib RVR.  No other complaints

## 2018-07-17 NOTE — ED Provider Notes (Addendum)
[facility] EMERGENCY DEPARTMENT Provider Note   CSN: [id number] Arrival date & time: 07/17/18  2308    History   Chief Complaint Chief Complaint  Patient presents with   Irregular Heart Beat    HPI Judith Boone is a 85 y.o. female.     HPI 85 year old female with history of COPD, hypertension, hyperlipidemia comes in with chief complaint of elevated heart rate. Patient reports that she was eating at home and suddenly her Apple Watch started beeping.  The warning flashing on her watch was stating that her heart rate was over 140.  Despite her checking her heart rate multiple times on the watch, the warning continued therefore she decided to call EMS.  Patient denies any chest pain, shortness of breath, palpitations, dizziness.  She denies any history of similar warnings in the past.  Patient does not wear her apple watch at nighttime.  She denies any recent illnesses, known CAD-CHF, thyroid disorder, PE-DVT or any risk factors for the same.  Past Medical History:  Diagnosis Date   Allergy    allergic rhinitis   Arthritis    Cellulitis of left leg 07-2014   hospitalized for 4 days   COPD (chronic obstructive pulmonary disease) (HCC)    GERD (gastroesophageal reflux disease)    History of granulomatous disease (HCC)    Hyperlipidemia    Hypertension    Left leg swelling    Right-sided low back pain with sciatica     Patient Active Problem List   Diagnosis Date Noted   CKD (chronic kidney disease) stage 3, GFR 30-59 ml/min ([place]) 08/17/2014   Sepsis ([place]) 08/17/2014   Cellulitis of leg, left 08/16/2014   Pain of great toe 03/23/2012   Leg swelling 03/23/2012   Decreased hearing 12/23/2011   Renal insufficiency 09/27/2011   Insomnia 05/16/2011   Radicular leg pain 05/16/2011   Hearing loss 12/12/2010   HTN (hypertension) 07/02/2010   Other and unspecified hyperlipidemia 07/02/2010   Arthritis 07/02/2010   GERD  (gastroesophageal reflux disease) 07/02/2010   Esophageal web 07/02/2010   PAD (peripheral artery disease) ([place]) 07/02/2010    Past Surgical History:  Procedure Laterality Date   ABDOMINAL HYSTERECTOMY     broken ankle repair     left   CATARACT EXTRACTION     TOTAL KNEE ARTHROPLASTY     right     OB History   No obstetric history on file.      Home Medications    Prior to Admission medications   Medication Sig Start Date End Date Taking? Authorizing Provider  amLODipine (NORVASC) 5 MG tablet Take 5 mg by mouth daily. 04/13/15  Yes [provider]  ANORO ELLIPTA 62.5-25 MCG/INH AEPB Inhale 1 puff into the lungs daily. 07/04/18  Yes [provider]  atorvastatin (LIPITOR) 20 MG tablet Take 20 mg by mouth daily at 6 PM.    Yes [provider]  esomeprazole (NEXIUM) 40 MG capsule Take 40 mg by mouth daily.    Yes [provider]  hydrALAZINE (APRESOLINE) 25 MG tablet Take 25 mg by mouth 2 (two) times daily.    Yes [provider]  pramipexole (MIRAPEX) 0.125 MG tablet Take 0.125 mg by mouth at bedtime. 02/09/15  Yes [provider]  quinapril (ACCUPRIL) 40 MG tablet Take 40 mg by mouth at bedtime.   Yes [provider]  traMADol (ULTRAM) 50 MG tablet Take 50 mg by mouth every 6 (six) hours as needed for  moderate pain.    Yes [provider]  VENTOLIN HFA 108 (90 BASE) MCG/ACT inhaler Inhale 1-2 puffs into the lungs every 6 (six) hours as needed for wheezing.  03/03/15  Yes [provider]  apixaban (ELIQUIS) 2.5 MG TABS tablet Take 1 tablet (2.5 mg total) by mouth 2 (two) times daily for 30 days. 07/18/18 08/17/18  Varney Biles, MD  metoprolol succinate (TOPROL-XL) 50 MG 24 hr tablet Take 1 tablet (50 mg total) by mouth daily. 07/18/18   Varney Biles, MD  saccharomyces boulardii (FLORASTOR) 250 MG capsule Take 1 capsule (250 mg total) by mouth 2 (two) times daily. Patient not taking: Reported on  07/17/2018 08/20/14   Thurnell Lose, MD    Family History Family History  Problem Relation Age of Onset   Alcohol abuse Father    Heart disease Father    Hypertension Father    Hypertension Daughter    Hypertension Son     Social History Social History   Tobacco Use   Smoking status: Former Smoker    Last attempt to quit: 04/29/1979    Years since quitting: 39.2   Smokeless tobacco: Never Used  Substance Use Topics   Alcohol use: Yes    Alcohol/week: 0.0 standard drinks   Drug use: No     Allergies   Bacitracin; Neosporin [neomycin-bacitracin zn-polymyx]; and Polysporin [bacitracin-polymyxin b]   Review of Systems Review of Systems  Constitutional: Negative for activity change.  Respiratory: Negative for shortness of breath.   Cardiovascular: Negative for chest pain.  Neurological: Negative for dizziness.  All other systems reviewed and are negative.    Physical Exam Updated Vital Signs BP 121/67    Pulse 72    Temp 98.5 F (36.9 C) (Oral)    Resp (!) 22    Ht 5' (1.524 m)    Wt 63.5 kg    SpO2 95%    BMI 27.34 kg/m   Physical Exam Vitals signs and nursing note reviewed.  Constitutional:      Appearance: She is well-developed.  HENT:     Head: Normocephalic and atraumatic.  Neck:     Musculoskeletal: Normal range of motion and neck supple.  Cardiovascular:     Rate and Rhythm: Tachycardia present. Rhythm irregular.  Pulmonary:     Effort: Pulmonary effort is normal.  Abdominal:     General: Bowel sounds are normal.  Musculoskeletal:     Right lower leg: No edema.     Left lower leg: No edema.  Skin:    General: Skin is warm and dry.  Neurological:     Mental Status: She is alert and oriented to person, place, and time.      ED Treatments / Results  Labs (all labs ordered are listed, but only abnormal results are displayed) Labs Reviewed  BASIC METABOLIC PANEL - Abnormal; Notable for the following components:      Result Value    Chloride 114 (*)    CO2 18 (*)    Glucose, Bld 138 (*)    BUN 37 (*)    Creatinine, Ser 1.78 (*)    GFR calc non Af Amer 26 (*)    GFR calc Af Amer 30 (*)    All other components within normal limits  CBC - Abnormal; Notable for the following components:   RDW 20.5 (*)    All other components within normal limits  MAGNESIUM  TSH    EKG EKG Interpretation  Date/Time:  Saturday  July 17 2018 23:14:10 EDT Ventricular Rate:  126 PR Interval:    QRS Duration: 74 QT Interval:  285 QTC Calculation: 413 R Axis:   25 Text Interpretation:  Atrial fibrillation Anteroseptal infarct, age indeterminate afib is new Confirmed by Varney Biles [phone]) on 07/17/2018 11:59:07 PM    EKG Interpretation  Date/Time:  "Sunday July 18 2018 02:09:24 EDT Ventricular Rate:  82 PR Interval:    QRS Duration: 80 QT Interval:  396 QTC Calculation: 463 R Axis:   63 Text Interpretation:  Atrial fibrillation No acute changes rvr resolved Confirmed by Cayman Kielbasa ([id number]) on 07/18/2018 3:03:52 AM        Radiology Dg Chest Port 1 View  Result Date: 07/18/2018 CLINICAL DATA:  Atrial fibrillation. EXAM: PORTABLE CHEST 1 VIEW COMPARISON:  08/16/2014 FINDINGS: Cardiomediastinal silhouette is enlarged. Mediastinal contours appear intact. Calcific atherosclerotic disease of the aorta. There is no evidence of focal airspace consolidation, pleural effusion or pneumothorax. Osseous structures are without acute abnormality. Soft tissues are grossly normal. IMPRESSION: 1. Enlarged cardiac silhouette. 2. Calcific atherosclerotic disease of the aorta. Electronically Signed   By: Dobrinka  Dimitrova M.D.   On: 07/18/2018 00:04    Procedures .Critical Care Performed by: Minal Stuller, MD Authorized by: Shanicka Oldenkamp, MD   Critical care provider statement:    Critical care time (minutes):  45   Critical care was necessary to treat or prevent imminent or life-threatening deterioration of the following  conditions:  Circulatory failure and cardiac failure   Critical care was time spent personally by me on the following activities:  Discussions with consultants, evaluation of patient's response to treatment, examination of patient, ordering and performing treatments and interventions, ordering and review of laboratory studies, ordering and review of radiographic studies, pulse oximetry, re-evaluation of patient's condition, obtaining history from patient or surrogate and review of old charts   (including critical care time)  Medications Ordered in ED Medications  metoprolol tartrate (LOPRESSOR) injection 2.5 mg (2.5 mg Intravenous Not Given 07/17/18 2342)  apixaban (ELIQUIS) tablet 2.5 mg (has no administration in time range)  metoprolol tartrate (LOPRESSOR) tablet 25 mg (25 mg Oral Given 07/17/18 2347)  sodium chloride 0.9 % bolus 500 mL (500 mLs Intravenous New Bag/Given 07/18/18 0208)     Initial Impression / Assessment and Plan / ED Course  I have reviewed the triage vital signs and the nursing notes.  Pertinent labs & imaging results that were available during my care of the patient were reviewed by me and considered in my medical decision making (see chart for details).  Clinical Course as of Jul 18 302  Sun Jul 18, 2018  0003 Patient's heart rate improved prior to the nurse giving her IV Toprol.  I will switch her to oral metoprolol and reassess. Patient's creatinine is slightly elevated.  IV fluid ordered.  Pulse Rate(!): 107 [AN]  0206 Patient reassessed.  Her heart rate was in the 80s.  She continues to feel well.  She has CKD - we will give her eliquis 2.5 mg for stroke prevention. Dose confirmed with Pharmacy and Cardiology.  Cardiology also recommends Toprol-XL 50 mg daily for rate control.   [AN]    Clinical Course User Index [AN] [place] Dohmen, MD       86"  year old female comes in a chief complaint of tachycardia. She has known history of COPD, hypertension.  She is  noted to be in A. fib with RVR.  This is a new diagnosis that was picked up by  her Apple Watch.  She is not symptomatic with her elevated heart rate and denies any prior alarms for elevated heart rate on her apple watch.  Patient does not wear her watch at nighttime, and so I am unsure if she has had bouts of these at nighttime when asleep.  Plan is to get basic labs.  I will give her IV metoprolol 2.5 mg for her A. fib with RVR and reassess.  CHA2DS2/VAS Stroke Risk Points  Current as of 2 minutes ago     4 >= 2 Points: High Risk  1 - 1.99 Points: Medium Risk  0 Points: Low Risk    The patient's score has not changed in the past year.:  No Change     Details    This score determines the patient's risk of having a stroke if the  patient has atrial fibrillation.       Points Metrics  0 Has Congestive Heart Failure:  No    Current as of 2 minutes ago  0 Has Vascular Disease:  No    Current as of 2 minutes ago  1 Has Hypertension:  Yes    Current as of 2 minutes ago  2 Age:  62    Current as of 2 minutes ago  0 Has Diabetes:  No    Current as of 2 minutes ago  0 Had Stroke:  No  Had TIA:  No  Had thromboembolism:  No    Current as of 2 minutes ago  1 Female:  Yes    Current as of 2 minutes ago      Patient denies any history of brain bleeds, current GI bleed.  She denies frequent fall.  Pharmacy to come and consultation on Eliquis.     Final Clinical Impressions(s) / ED Diagnoses   Final diagnoses:  Atrial fibrillation with RVR [facility])    ED Discharge Orders         Ordered    apixaban (ELIQUIS) 2.5 MG TABS tablet  2 times daily     07/18/18 0255    metoprolol succinate (TOPROL-XL) 50 MG 24 hr tablet  Daily     07/18/18 0255    Amb referral to AFIB Clinic     07/17/18 2318           Varney Biles, MD 07/18/18 6468    Varney Biles, MD 07/18/18 [phone]

## 2018-07-18 DIAGNOSIS — I4891 Unspecified atrial fibrillation: Secondary | ICD-10-CM | POA: Diagnosis not present

## 2018-07-18 LAB — TSH: TSH: 2.184 u[IU]/mL (ref 0.350–4.500)

## 2018-07-18 MED ORDER — APIXABAN 2.5 MG PO TABS
2.5000 mg | ORAL_TABLET | Freq: Two times a day (BID) | ORAL | Status: DC
  Administered 2018-07-18: 2.5 mg via ORAL
  Filled 2018-07-18: qty 1

## 2018-07-18 MED ORDER — APIXABAN 2.5 MG PO TABS: 2.5000 mg | ORAL_TABLET | Freq: Two times a day (BID) | ORAL | 0 refills | Status: DC

## 2018-07-18 MED ORDER — METOPROLOL SUCCINATE ER 50 MG PO TB24: 50.0000 mg | ORAL_TABLET | Freq: Every day | ORAL | 0 refills | Status: DC

## 2018-07-18 NOTE — Discharge Instructions (Addendum)
We saw in the ER for elevated heart rate. You were noted to have a condition called atrial fibrillation.  This condition requires you to be started on medications to control your heart rate and also to prevent strokes in the future.  Therefore you will be started on 2 new medications, 1 of which called Eliquis is a blood thinner.  Please read the instructions provided.  Please follow-up with the A. fib clinic by calling them for an appointment on Monday afternoon if you do not hear from them. Return to the ER immediately if you start having elevated heart rate again along with dizziness, chest pain, shortness of breath.

## 2018-07-18 NOTE — ED Notes (Signed)
Pharmcy educating about eliquis bedside.

## 2018-07-19 ENCOUNTER — Encounter (HOSPITAL_COMMUNITY): Payer: Self-pay

## 2018-07-22 DIAGNOSIS — K219 Gastro-esophageal reflux disease without esophagitis: Secondary | ICD-10-CM | POA: Diagnosis not present

## 2018-07-22 DIAGNOSIS — I517 Cardiomegaly: Secondary | ICD-10-CM | POA: Diagnosis not present

## 2018-07-22 DIAGNOSIS — I129 Hypertensive chronic kidney disease with stage 1 through stage 4 chronic kidney disease, or unspecified chronic kidney disease: Secondary | ICD-10-CM | POA: Diagnosis not present

## 2018-07-22 DIAGNOSIS — I4891 Unspecified atrial fibrillation: Secondary | ICD-10-CM | POA: Diagnosis not present

## 2018-07-22 DIAGNOSIS — R739 Hyperglycemia, unspecified: Secondary | ICD-10-CM | POA: Diagnosis not present

## 2018-07-22 DIAGNOSIS — Z7689 Persons encountering health services in other specified circumstances: Secondary | ICD-10-CM | POA: Diagnosis not present

## 2018-07-22 DIAGNOSIS — N184 Chronic kidney disease, stage 4 (severe): Secondary | ICD-10-CM | POA: Diagnosis not present

## 2018-07-23 ENCOUNTER — Ambulatory Visit (HOSPITAL_COMMUNITY): Payer: Medicare Other | Admitting: Physician Assistant

## 2018-07-23 ENCOUNTER — Ambulatory Visit (HOSPITAL_COMMUNITY)
Admission: RE | Admit: 2018-07-23 | Discharge: 2018-07-23 | Disposition: A | Discharge status: Home / Self Care | Payer: Medicare Other | Source: Ambulatory Visit | Attending: Physician Assistant | Admitting: Physician Assistant

## 2018-07-23 ENCOUNTER — Other Ambulatory Visit: Payer: Self-pay

## 2018-07-23 ENCOUNTER — Other Ambulatory Visit (HOSPITAL_COMMUNITY): Payer: Self-pay | Admitting: *Deleted

## 2018-07-23 DIAGNOSIS — Z79899 Other long term (current) drug therapy: Secondary | ICD-10-CM

## 2018-07-23 DIAGNOSIS — I10 Essential (primary) hypertension: Secondary | ICD-10-CM | POA: Diagnosis not present

## 2018-07-23 DIAGNOSIS — Z7901 Long term (current) use of anticoagulants: Secondary | ICD-10-CM

## 2018-07-23 DIAGNOSIS — I480 Paroxysmal atrial fibrillation: Secondary | ICD-10-CM | POA: Diagnosis not present

## 2018-07-23 MED ORDER — APIXABAN 2.5 MG PO TABS: 2.5000 mg | ORAL_TABLET | Freq: Two times a day (BID) | ORAL | 2 refills | Status: DC

## 2018-07-23 MED ORDER — METOPROLOL SUCCINATE ER 25 MG PO TB24: 25.0000 mg | ORAL_TABLET | Freq: Every day | ORAL | 2 refills | Status: AC

## 2018-07-23 NOTE — Progress Notes (Signed)
Electrophysiology TeleHealth Note   Due to national recommendations of social distancing due to [place] 19, Audio/video telehealth visit is felt to be most appropriate for this patient at this time.  See MyChart message from today for patient consent regarding telehealth for the Atrial Fibrillation Clinic.    Date:  07/23/2018   ID:  Judith Boone, DOB May 04, 1931, MRN [id number]  Location: home  Provider location: [address] [place], [place] [address] Evaluation Performed: New patient consult  PCP:  Velna Hatchet, MD   CC: Consultation for atrial fibrillation   History of Present Illness: Judith Boone is a 85 y.o. female who presents via audio/video conferencing for a telehealth visit today.   The patient is referred for new consultation regarding new onset paroxysmal atrial fibkrilaltion by [facility]. Patient has a h/o COPD, HTN, HLD, and PVD. Patient reports that about one week ago, her Apple Watch alerted her to elevated heart rates. She was asymptomatic but called EMS and was taken to the ER where she was found to be in afib with RVR. She was started on Eliquis and metoprolol and sent home in rate controlled afib. She reports that not long after being home, her Apple Watch ECG read as normal rhythm. She has noted some increased fatigue over the last week. She denies any snoring or significant alcohol use.  Today, she denies symptoms of palpitations, chest pain, shortness of breath, orthopnea, PND, lower extremity edema, claudication, dizziness, presyncope, syncope, bleeding, or neurologic sequela. +fatigue. The patient is tolerating medications without difficulties and is otherwise without complaint today.   she denies symptoms of cough, fevers, chills, or new SOB worrisome for COVID 19.     Atrial Fibrillation Risk Factors:  she does not have symptoms or diagnosis of sleep apnea. she does not have a history of rheumatic fever. she does not have a history of alcohol use. The  patient does not have a history of early familial atrial fibrillation or other arrhythmias.  she has a BMI of There is no height or weight on file to calculate BMI..  BP 136/63 (provided by patient home BP machine) Pulse 60  Past Medical History:  Diagnosis Date  . Allergy    allergic rhinitis  . Arthritis   . Cellulitis of left leg 07-2014   hospitalized for 4 days  . COPD (chronic obstructive pulmonary disease) ([place])   . GERD (gastroesophageal reflux disease)   . History of granulomatous disease ([place])   . Hyperlipidemia   . Hypertension   . Left leg swelling   . Right-sided low back pain with sciatica    Past Surgical History:  Procedure Laterality Date  . ABDOMINAL HYSTERECTOMY    . broken ankle repair     left  . CATARACT EXTRACTION    . TOTAL KNEE ARTHROPLASTY     right     Current Outpatient Medications  Medication Sig Dispense Refill  . amLODipine (NORVASC) 5 MG tablet Take 5 mg by mouth daily.  5  . ANORO ELLIPTA 62.5-25 MCG/INH AEPB Inhale 1 puff into the lungs daily.    Marland Kitchen apixaban (ELIQUIS) 2.5 MG TABS tablet Take 1 tablet (2.5 mg total) by mouth 2 (two) times daily for 30 days. 60 tablet 0  . atorvastatin (LIPITOR) 20 MG tablet Take 20 mg by mouth daily at 6 PM.     . esomeprazole (NEXIUM) 40 MG capsule Take 40 mg by mouth daily.     . hydrALAZINE (APRESOLINE)  25 MG tablet Take 25 mg by mouth 2 (two) times daily.     . metoprolol succinate (TOPROL-XL) 50 MG 24 hr tablet Take 1 tablet (50 mg total) by mouth daily. 14 tablet 0  . pramipexole (MIRAPEX) 0.125 MG tablet Take 0.125 mg by mouth at bedtime.  3  . quinapril (ACCUPRIL) 40 MG tablet Take 40 mg by mouth at bedtime.    . saccharomyces boulardii (FLORASTOR) 250 MG capsule Take 1 capsule (250 mg total) by mouth 2 (two) times daily. (Patient not taking: Reported on 07/17/2018) 30 capsule 0  . traMADol (ULTRAM) 50 MG tablet Take 50 mg by mouth every 6 (six) hours as needed for moderate pain.     . VENTOLIN HFA  108 (90 BASE) MCG/ACT inhaler Inhale 1-2 puffs into the lungs every 6 (six) hours as needed for wheezing.   3   No current facility-administered medications for this encounter.     Allergies:   Bacitracin; Neosporin [neomycin-bacitracin zn-polymyx]; and Polysporin [bacitracin-polymyxin b]   Social History:  The patient  reports that she quit smoking about 39 years ago. She has never used smokeless tobacco. She reports current alcohol use. She reports that she does not use drugs.   Family History:  The patient's family history includes Alcohol abuse in her father; Heart disease in her father; Hypertension in her daughter, father, and son.    ROS:  Please see the history of present illness.   All other systems are personally reviewed and negative.   Exam: Well appearing, alert and conversant, regular work of breathing,  good skin color  Recent Labs: 07/17/2018: BUN 37; Creatinine, Ser 1.78; Hemoglobin 12.3; Magnesium 2.0; Platelets 183; Potassium 4.4; Sodium 140; TSH 2.184  personally reviewed    Other studies personally reviewed: Additional studies/ records that were reviewed today include: Epic notes  The patient has wearable device technology. Patient given instructions on how to upload ECG strips for review in MyChart.   ASSESSMENT AND PLAN:  1. New onset paroxysmal atrial fibrillation Patient was asymptomatic during her afib episode. Apple Watch currently reading has NSR HR 60s. Patient does report more fatigue, could be related to new BB. Will decrease Toprol to 25 mg daily and encouraged her to take this in the evening. Continue Eliquis 2.5 mg BID Will consider echocardiogram once COVID-19 precautions end. Bmet/mag/TSH stable at ER.  General education about afib provided.   This patients CHA2DS2-VASc Score and unadjusted Ischemic Stroke Rate (% per year) is equal to 7.2 % stroke rate/year from a score of 5  Above score calculated as 1 point each if present [CHF, HTN, DM,  Vascular=MI/PAD/Aortic Plaque, Age if 65-74, or Female] Above score calculated as 2 points each if present [Age > 75, or Stroke/TIA/TE]  2. HTN Stable, med change as above.   COVID screen The patient does not have any symptoms that suggest any further testing/ screening at this time.  Social distancing reinforced today.  Follow-up for telehealth visit with afib clinic in 4 weeks.   Current medicines are reviewed at length with the patient today.   The patient does not have concerns regarding her medicines.  The following changes were made today:  Decrease metoprolol  Labs/ tests ordered today include:  No orders of the defined types were placed in this encounter.   Patient Risk:  after full review of this patients clinical status, I feel that they are at moderate risk at this time.   Today, I have spent 23 minutes with  the patient with telehealth technology discussing general afib education, therapeutic options, and anticoagulation precautions.    Gwenlyn Perking PA-C 07/23/2018 2:53 PM  Afib [facility] [address] [place],  [address] [phone]

## 2018-07-26 ENCOUNTER — Encounter (HOSPITAL_COMMUNITY): Payer: Self-pay

## 2018-08-17 ENCOUNTER — Encounter (HOSPITAL_COMMUNITY): Payer: Self-pay

## 2018-08-18 ENCOUNTER — Ambulatory Visit (HOSPITAL_COMMUNITY)
Admission: RE | Admit: 2018-08-18 | Discharge: 2018-08-18 | Disposition: A | Discharge status: Home / Self Care | Payer: Medicare Other | Source: Ambulatory Visit | Attending: Physician Assistant | Admitting: Physician Assistant

## 2018-08-18 ENCOUNTER — Other Ambulatory Visit: Payer: Self-pay

## 2018-08-18 DIAGNOSIS — I480 Paroxysmal atrial fibrillation: Secondary | ICD-10-CM

## 2018-08-18 NOTE — Progress Notes (Signed)
Electrophysiology TeleHealth Note   Due to national recommendations of social distancing due to [place] 19, Audio/video telehealth visit is felt to be most appropriate for this patient at this time.  See MyChart message from today for patient consent regarding telehealth for the [facility].    Date:  08/18/2018   ID:  Judith Boone, DOB Mar 13, 1932, MRN [id number]  Location: home  Provider location: [address] [place], [place] [address] Evaluation Performed: Follow up  PCP:  Velna Hatchet, MD   CC: Follow up for atrial fibrillation   History of Present Illness: Judith Boone is a 85 y.o. female who presents via audio/video conferencing for a telehealth visit today. Patient reports that she has done well since her last visit. She checks her Apple Watch regularly which has shown SR. Her fatigue has also improved with decreasing her BB. Overall she feels well.  Today, she denies symptoms of palpitations, chest pain, shortness of breath, orthopnea, PND, lower extremity edema, claudication, dizziness, presyncope, syncope, bleeding, or neurologic sequela. The patient is tolerating medications without difficulties and is otherwise without complaint today.   she denies symptoms of cough, fevers, chills, or new SOB worrisome for COVID 19.     Atrial Fibrillation Risk Factors:  she does not have symptoms or diagnosis of sleep apnea. she does not have a history of rheumatic fever. she does not have a history of alcohol use. The patient does not have a history of early familial atrial fibrillation or other arrhythmias.  she has a BMI of There is no height or weight on file to calculate BMI.Marland Kitchen   Past Medical History:  Diagnosis Date  . Allergy    allergic rhinitis  . Arthritis   . Cellulitis of left leg 07-2014   hospitalized for 4 days  . COPD (chronic obstructive pulmonary disease) ([place])   . GERD (gastroesophageal reflux disease)   . History of granulomatous disease  ([place])   . Hyperlipidemia   . Hypertension   . Left leg swelling   . Right-sided low back pain with sciatica    Past Surgical History:  Procedure Laterality Date  . ABDOMINAL HYSTERECTOMY    . broken ankle repair     left  . CATARACT EXTRACTION    . TOTAL KNEE ARTHROPLASTY     right     Current Outpatient Medications  Medication Sig Dispense Refill  . amLODipine (NORVASC) 5 MG tablet Take 5 mg by mouth daily.  5  . ANORO ELLIPTA 62.5-25 MCG/INH AEPB Inhale 1 puff into the lungs daily.    Marland Kitchen apixaban (ELIQUIS) 2.5 MG TABS tablet Take 1 tablet (2.5 mg total) by mouth 2 (two) times daily for 30 days. 180 tablet 2  . atorvastatin (LIPITOR) 20 MG tablet Take 20 mg by mouth daily at 6 PM.     . esomeprazole (NEXIUM) 40 MG capsule Take 40 mg by mouth daily.     . hydrALAZINE (APRESOLINE) 25 MG tablet Take 25 mg by mouth 2 (two) times daily.     . metoprolol succinate (TOPROL-XL) 25 MG 24 hr tablet Take 1 tablet (25 mg total) by mouth daily. 90 tablet 2  . pramipexole (MIRAPEX) 0.125 MG tablet Take 0.125 mg by mouth at bedtime.  3  . quinapril (ACCUPRIL) 40 MG tablet Take 40 mg by mouth at bedtime.    . saccharomyces boulardii (FLORASTOR) 250 MG capsule Take 1 capsule (250 mg total) by mouth 2 (two) times daily. (Patient not  taking: Reported on 07/17/2018) 30 capsule 0  . traMADol (ULTRAM) 50 MG tablet Take 50 mg by mouth every 6 (six) hours as needed for moderate pain.     . VENTOLIN HFA 108 (90 BASE) MCG/ACT inhaler Inhale 1-2 puffs into the lungs every 6 (six) hours as needed for wheezing.   3   No current facility-administered medications for this encounter.     Allergies:   Bacitracin; Neosporin [neomycin-bacitracin zn-polymyx]; and Polysporin [bacitracin-polymyxin b]   Social History:  The patient  reports that she quit smoking about 39 years ago. She has never used smokeless tobacco. She reports current alcohol use. She reports that she does not use drugs.   Family History:  The  patient's family history includes Alcohol abuse in her father; Heart disease in her father; Hypertension in her daughter, father, and son.    ROS:  Please see the history of present illness.   All other systems are personally reviewed and negative.   Exam: Well appearing, alert and conversant, regular work of breathing, good skin color.  Recent Labs: 07/17/2018: BUN 37; Creatinine, Ser 1.78; Hemoglobin 12.3; Magnesium 2.0; Platelets 183; Potassium 4.4; Sodium 140; TSH 2.184  personally reviewed    Other studies personally reviewed: Additional studies/ records that were reviewed today include: Epic notes  The patient has wearable device technology. Patient given instructions on how to upload ECG strips for review in MyChart.   ASSESSMENT AND PLAN:  1. Paroxysmal atrial fibrillation Patient was asymptomatic during her afib episode.  Patient checks Apple Watch regularly which has shown SR. Continue Toprol 25 mg daily Continue Eliquis 2.5 mg BID Will consider echocardiogram once COVID-19 precautions end. Check CBC/Bmet at f/u  This patients CHA2DS2-VASc Score and unadjusted Ischemic Stroke Rate (% per year) is equal to 7.2 % stroke rate/year from a score of 5  Above score calculated as 1 point each if present [CHF, HTN, DM, Vascular=MI/PAD/Aortic Plaque, Age if 65-74, or Female] Above score calculated as 2 points each if present [Age > 75, or Stroke/TIA/TE]  2. HTN Stable, no changes today.   COVID screen The patient does not have any symptoms that suggest any further testing/ screening at this time.  Social distancing reinforced today.  Follow-up with AF clinic in 3 months.   Current medicines are reviewed at length with the patient today.   The patient does not have concerns regarding her medicines.  The following changes were made today:  none  Labs/ tests ordered today include:  No orders of the defined types were placed in this encounter.   Patient Risk:  after full  review of this patients clinical status, I feel that they are at moderate risk at this time.   Today, I have spent 12 minutes with the patient with telehealth technology discussing general afib education, therapeutic options, and anticoagulation precautions.    Gwenlyn Perking PA-C 08/18/2018 1:57 PM  [facility] [address] [place], [place] [address] [phone]

## 2018-09-16 ENCOUNTER — Other Ambulatory Visit (HOSPITAL_COMMUNITY): Payer: Self-pay | Admitting: *Deleted

## 2018-09-16 DIAGNOSIS — I480 Paroxysmal atrial fibrillation: Secondary | ICD-10-CM

## 2018-09-21 ENCOUNTER — Encounter (HOSPITAL_COMMUNITY): Payer: Self-pay

## 2018-10-06 DIAGNOSIS — Z85828 Personal history of other malignant neoplasm of skin: Secondary | ICD-10-CM | POA: Diagnosis not present

## 2018-10-06 DIAGNOSIS — L720 Epidermal cyst: Secondary | ICD-10-CM | POA: Diagnosis not present

## 2018-10-12 ENCOUNTER — Other Ambulatory Visit: Payer: Self-pay

## 2018-10-12 ENCOUNTER — Ambulatory Visit (HOSPITAL_COMMUNITY)
Admission: RE | Admit: 2018-10-12 | Discharge: 2018-10-12 | Disposition: A | Discharge status: Home / Self Care | Payer: Medicare Other | Source: Ambulatory Visit | Attending: Physician Assistant | Admitting: Physician Assistant

## 2018-10-12 DIAGNOSIS — I071 Rheumatic tricuspid insufficiency: Secondary | ICD-10-CM | POA: Insufficient documentation

## 2018-10-12 DIAGNOSIS — I480 Paroxysmal atrial fibrillation: Secondary | ICD-10-CM | POA: Diagnosis not present

## 2018-10-12 NOTE — Progress Notes (Signed)
  Echocardiogram 2D Echocardiogram has been performed.  Judith Boone Judith Boone 10/12/2018, 2:50 PM

## 2018-10-13 ENCOUNTER — Encounter (HOSPITAL_COMMUNITY): Payer: Self-pay | Admitting: *Deleted

## 2018-11-23 ENCOUNTER — Ambulatory Visit (HOSPITAL_COMMUNITY): Payer: Medicare Other | Admitting: Physician Assistant

## 2018-11-24 ENCOUNTER — Other Ambulatory Visit: Payer: Self-pay

## 2018-11-24 ENCOUNTER — Ambulatory Visit (HOSPITAL_COMMUNITY)
Admission: RE | Admit: 2018-11-24 | Discharge: 2018-11-24 | Disposition: A | Discharge status: Home / Self Care | Payer: Medicare Other | Source: Ambulatory Visit | Attending: Physician Assistant | Admitting: Physician Assistant

## 2018-11-24 ENCOUNTER — Encounter (HOSPITAL_COMMUNITY): Payer: Self-pay | Admitting: Physician Assistant

## 2018-11-24 VITALS — BP 162/80 | HR 84 | Ht 60.0 in | Wt 147.0 lb

## 2018-11-24 DIAGNOSIS — J449 Chronic obstructive pulmonary disease, unspecified: Secondary | ICD-10-CM | POA: Insufficient documentation

## 2018-11-24 DIAGNOSIS — I10 Essential (primary) hypertension: Secondary | ICD-10-CM | POA: Diagnosis not present

## 2018-11-24 DIAGNOSIS — I480 Paroxysmal atrial fibrillation: Secondary | ICD-10-CM | POA: Diagnosis not present

## 2018-11-24 DIAGNOSIS — Z7901 Long term (current) use of anticoagulants: Secondary | ICD-10-CM | POA: Insufficient documentation

## 2018-11-24 DIAGNOSIS — Z87891 Personal history of nicotine dependence: Secondary | ICD-10-CM | POA: Diagnosis not present

## 2018-11-24 DIAGNOSIS — K219 Gastro-esophageal reflux disease without esophagitis: Secondary | ICD-10-CM | POA: Insufficient documentation

## 2018-11-24 DIAGNOSIS — I739 Peripheral vascular disease, unspecified: Secondary | ICD-10-CM | POA: Diagnosis not present

## 2018-11-24 DIAGNOSIS — Z79899 Other long term (current) drug therapy: Secondary | ICD-10-CM | POA: Diagnosis not present

## 2018-11-24 DIAGNOSIS — E785 Hyperlipidemia, unspecified: Secondary | ICD-10-CM | POA: Insufficient documentation

## 2018-11-24 LAB — BASIC METABOLIC PANEL
Anion gap: 9 (ref 5–15)
BUN: 25 mg/dL — ABNORMAL HIGH (ref 8–23)
CO2: 21 mmol/L — ABNORMAL LOW (ref 22–32)
Calcium: 8.9 mg/dL (ref 8.9–10.3)
Chloride: 112 mmol/L — ABNORMAL HIGH (ref 98–111)
Creatinine, Ser: 1.43 mg/dL — ABNORMAL HIGH (ref 0.44–1.00)
GFR calc Af Amer: 39 mL/min — ABNORMAL LOW (ref >60)
GFR calc non Af Amer: 33 mL/min — ABNORMAL LOW (ref >60)
Glucose, Bld: 103 mg/dL — ABNORMAL HIGH (ref 70–99)
Potassium: 4.3 mmol/L (ref 3.5–5.1)
Sodium: 142 mmol/L (ref 135–145)

## 2018-11-24 LAB — CBC
HCT: 36.6 % (ref 36.0–46.0)
Hemoglobin: 11.9 g/dL — ABNORMAL LOW (ref 12.0–15.0)
MCH: 27.7 pg (ref 26.0–34.0)
MCHC: 32.5 g/dL (ref 30.0–36.0)
MCV: 85.3 fL (ref 80.0–100.0)
Platelets: 180 10*3/uL (ref 150–400)
RBC: 4.29 MIL/uL (ref 3.87–5.11)
RDW: 19.5 % — ABNORMAL HIGH (ref 11.5–15.5)
WBC: 5.4 10*3/uL (ref 4.0–10.5)
nRBC: 0 % (ref 0.0–0.2)

## 2018-11-24 NOTE — Progress Notes (Signed)
Primary Care Physician: Velna Hatchet, MD Primary Cardiologist: none Primary Electrophysiologist: none Referring Physician: [facility]   Judith Boone is a 85 y.o. female with a history of COPD, HTN, HLD, PVD and paroxysmal atrial fibrillation who presents for follow up in the [facility].  The patient was initially diagnosed with atrial fibrillation 07/17/18 after presenting to the ER with elevated heart rates on her Apple Watch. She was asymptomatic during the event. She was started on Eliquis and metoprolol.   On follow up today, patient reports that she has done very well. She has not had any symptoms of heart racing or palpitations. She has not had any afib on her Apple Watch.   Today, she denies symptoms of palpitations, chest pain, shortness of breath, orthopnea, PND, lower extremity edema, dizziness, presyncope, syncope, snoring, daytime somnolence, bleeding, or neurologic sequela. The patient is tolerating medications without difficulties and is otherwise without complaint today.    Atrial Fibrillation Risk Factors:  she does not have symptoms or diagnosis of sleep apnea. she does not have a history of rheumatic fever. she does not have a history of alcohol use. The patient does not have a history of early familial atrial fibrillation or other arrhythmias.  she has a BMI of Body mass index is 28.71 kg/m.Marland Kitchen Filed Weights   11/24/18 1422  Weight: 66.7 kg    Family History  Problem Relation Age of Onset  . Alcohol abuse Father   . Heart disease Father   . Hypertension Father   . Hypertension Daughter   . Hypertension Son      Atrial Fibrillation Management history:  Previous antiarrhythmic drugs: none Previous cardioversions: none Previous ablations: none CHADS2VASC score: 5 Anticoagulation history: Eliquis, renally dosed.    Past Medical History:  Diagnosis Date  . Allergy    allergic rhinitis  . Arthritis   . Cellulitis of  left leg 07-2014   hospitalized for 4 days  . COPD (chronic obstructive pulmonary disease) ([place])   . GERD (gastroesophageal reflux disease)   . History of granulomatous disease ([place])   . Hyperlipidemia   . Hypertension   . Left leg swelling   . Right-sided low back pain with sciatica    Past Surgical History:  Procedure Laterality Date  . ABDOMINAL HYSTERECTOMY    . broken ankle repair     left  . CATARACT EXTRACTION    . TOTAL KNEE ARTHROPLASTY     right    Current Outpatient Medications  Medication Sig Dispense Refill  . amLODipine (NORVASC) 5 MG tablet Take 5 mg by mouth daily.  5  . ANORO ELLIPTA 62.5-25 MCG/INH AEPB Inhale 1 puff into the lungs daily.    Marland Kitchen apixaban (ELIQUIS) 2.5 MG TABS tablet Take 1 tablet (2.5 mg total) by mouth 2 (two) times daily for 30 days. 180 tablet 2  . atorvastatin (LIPITOR) 20 MG tablet Take 20 mg by mouth daily at 6 PM.     . cetirizine (ZYRTEC) 10 MG tablet Take 10 mg by mouth daily.    . famotidine (PEPCID) 20 MG tablet Take 20 mg by mouth daily.    . hydrALAZINE (APRESOLINE) 25 MG tablet Take 25 mg by mouth 2 (two) times daily.     . metoprolol succinate (TOPROL-XL) 25 MG 24 hr tablet Take 1 tablet (25 mg total) by mouth daily. 90 tablet 2  . omeprazole (PRILOSEC) 40 MG capsule 1 capsule daily.    . pramipexole (MIRAPEX)  0.125 MG tablet Take 0.125 mg by mouth at bedtime.  3  . quinapril (ACCUPRIL) 40 MG tablet Take 40 mg by mouth at bedtime.    . traMADol (ULTRAM) 50 MG tablet Take 50 mg by mouth every 6 (six) hours as needed for moderate pain.     . VENTOLIN HFA 108 (90 BASE) MCG/ACT inhaler Inhale 1-2 puffs into the lungs every 6 (six) hours as needed for wheezing.   3   No current facility-administered medications for this encounter.     Allergies  Allergen Reactions  . Bacitracin     unknown  . Neosporin [Neomycin-Bacitracin Zn-Polymyx]     unknown  . Polysporin [Bacitracin-Polymyxin B]     unknown    Social History    Socioeconomic History  . Marital status: Widowed    Spouse name: Not on file  . Number of children: Not on file  . Years of education: Not on file  . Highest education level: Not on file  Occupational History  . Not on file  Social Needs  . Financial resource strain: Not on file  . Food insecurity    Worry: Not on file    Inability: Not on file  . Transportation needs    Medical: Not on file    Non-medical: Not on file  Tobacco Use  . Smoking status: Former Smoker    Quit date: 04/29/1979    Years since quitting: 39.6  . Smokeless tobacco: Never Used  Substance and Sexual Activity  . Alcohol use: Yes    Alcohol/week: 0.0 standard drinks  . Drug use: No  . Sexual activity: Not on file  Lifestyle  . Physical activity    Days per week: Not on file    Minutes per session: Not on file  . Stress: Not on file  Relationships  . Social Herbalist on phone: Not on file    Gets together: Not on file    Attends religious service: Not on file    Active member of club or organization: Not on file    Attends meetings of clubs or organizations: Not on file    Relationship status: Not on file  . Intimate partner violence    Fear of current or ex partner: Not on file    Emotionally abused: Not on file    Physically abused: Not on file    Forced sexual activity: Not on file  Other Topics Concern  . Not on file  Social History Narrative  . Not on file     ROS- All systems are reviewed and negative except as per the HPI above.  Physical Exam: Vitals:   11/24/18 1422  BP: (!) 162/80  Pulse: 84  Weight: 66.7 kg  Height: 5' (1.524 m)    GEN- The patient is well appearing elderly female, alert and oriented x 3 today.   Head- normocephalic, atraumatic Eyes-  Sclera clear, conjunctiva pink Ears- hearing intact Oropharynx- clear Neck- supple  Lungs- Clear to ausculation bilaterally, normal work of breathing Heart- Regular rate and rhythm, no murmurs, rubs or gallops   GI- soft, NT, ND, + BS Extremities- no clubbing, cyanosis, or edema MS- no significant deformity or atrophy Skin- no rash or lesion Psych- euthymic mood, full affect Neuro- strength and sensation are intact  Wt Readings from Last 3 Encounters:  11/24/18 66.7 kg  07/17/18 63.5 kg  11/04/17 62.6 kg    EKG today demonstrates SR HR 84, 1st degree AV block,  slow R wave prog, PR 244, QRS 72, QTc 432  Echo 10/12/18 demonstrated   1. The left ventricle has normal systolic function with an ejection fraction of 60-65%. The cavity size was normal. Left ventricular diastolic Doppler parameters are consistent with impaired relaxation.  2. The mitral valve is grossly normal.  3. The tricuspid valve is grossly normal.  4. The aortic valve was not well visualized. Aortic valve regurgitation is trivial by color flow Doppler. No stenosis of the aortic valve.  5. Normal LV function; mild diastolic dysfunction; trace AI; mild TR; mildly elevated pulmonary pressure.  Epic records are reviewed at length today  Assessment and Plan:  1. Paroxysmal atrial fibrillation Patient appears to be maintaining SR. Continue Eliquis 2.5 mg BID. CBC/Bmet today. Continue metoprolol 25 mg daily Encouraged pt to continue to monitor for afib with her Apple Watch.  This patients CHA2DS2-VASc Score and unadjusted Ischemic Stroke Rate (% per year) is equal to 7.2 % stroke rate/year from a score of 5  Above score calculated as 1 point each if present [CHF, HTN, DM, Vascular=MI/PAD/Aortic Plaque, Age if 65-74, or Female] Above score calculated as 2 points each if present [Age > 75, or Stroke/TIA/TE]   2. HTN Elevated today, has been well controlled at home and and other office visits. Patient to keep BP log for review at next visit. No changes today.   Follow up in AF clinic in 4 months.   [facility] [address] [place], [place] [address] [phone] 11/24/2018 3:00  PM

## 2018-12-03 DIAGNOSIS — Z961 Presence of intraocular lens: Secondary | ICD-10-CM | POA: Diagnosis not present

## 2018-12-03 DIAGNOSIS — H04123 Dry eye syndrome of bilateral lacrimal glands: Secondary | ICD-10-CM | POA: Diagnosis not present

## 2018-12-03 DIAGNOSIS — H524 Presbyopia: Secondary | ICD-10-CM | POA: Diagnosis not present

## 2018-12-03 DIAGNOSIS — H353211 Exudative age-related macular degeneration, right eye, with active choroidal neovascularization: Secondary | ICD-10-CM | POA: Diagnosis not present

## 2019-01-19 DIAGNOSIS — M8580 Other specified disorders of bone density and structure, unspecified site: Secondary | ICD-10-CM | POA: Diagnosis not present

## 2019-01-19 DIAGNOSIS — R739 Hyperglycemia, unspecified: Secondary | ICD-10-CM | POA: Diagnosis not present

## 2019-01-19 DIAGNOSIS — E7849 Other hyperlipidemia: Secondary | ICD-10-CM | POA: Diagnosis not present

## 2019-01-19 DIAGNOSIS — M8589 Other specified disorders of bone density and structure, multiple sites: Secondary | ICD-10-CM | POA: Diagnosis not present

## 2019-01-26 DIAGNOSIS — I739 Peripheral vascular disease, unspecified: Secondary | ICD-10-CM | POA: Diagnosis not present

## 2019-01-26 DIAGNOSIS — I4891 Unspecified atrial fibrillation: Secondary | ICD-10-CM | POA: Diagnosis not present

## 2019-01-26 DIAGNOSIS — M810 Age-related osteoporosis without current pathological fracture: Secondary | ICD-10-CM | POA: Diagnosis not present

## 2019-01-26 DIAGNOSIS — Z1331 Encounter for screening for depression: Secondary | ICD-10-CM | POA: Diagnosis not present

## 2019-01-26 DIAGNOSIS — Z0000 Encounter for general adult medical examination without abnormal findings: Secondary | ICD-10-CM | POA: Diagnosis not present

## 2019-01-26 DIAGNOSIS — K219 Gastro-esophageal reflux disease without esophagitis: Secondary | ICD-10-CM | POA: Diagnosis not present

## 2019-01-26 DIAGNOSIS — D692 Other nonthrombocytopenic purpura: Secondary | ICD-10-CM | POA: Diagnosis not present

## 2019-01-26 DIAGNOSIS — R509 Fever, unspecified: Secondary | ICD-10-CM | POA: Diagnosis not present

## 2019-01-26 DIAGNOSIS — I129 Hypertensive chronic kidney disease with stage 1 through stage 4 chronic kidney disease, or unspecified chronic kidney disease: Secondary | ICD-10-CM | POA: Diagnosis not present

## 2019-01-26 DIAGNOSIS — I517 Cardiomegaly: Secondary | ICD-10-CM | POA: Diagnosis not present

## 2019-01-26 DIAGNOSIS — E785 Hyperlipidemia, unspecified: Secondary | ICD-10-CM | POA: Diagnosis not present

## 2019-01-26 DIAGNOSIS — I10 Essential (primary) hypertension: Secondary | ICD-10-CM | POA: Diagnosis not present

## 2019-01-26 DIAGNOSIS — N184 Chronic kidney disease, stage 4 (severe): Secondary | ICD-10-CM | POA: Diagnosis not present

## 2019-02-03 ENCOUNTER — Other Ambulatory Visit: Payer: Self-pay

## 2019-02-03 ENCOUNTER — Emergency Department (HOSPITAL_BASED_OUTPATIENT_CLINIC_OR_DEPARTMENT_OTHER): Payer: Medicare Other

## 2019-02-03 ENCOUNTER — Encounter (HOSPITAL_BASED_OUTPATIENT_CLINIC_OR_DEPARTMENT_OTHER): Payer: Self-pay | Admitting: *Deleted

## 2019-02-03 ENCOUNTER — Inpatient Hospital Stay (HOSPITAL_BASED_OUTPATIENT_CLINIC_OR_DEPARTMENT_OTHER)
Admission: EM | Admit: 2019-02-03 | Discharge: 2019-02-07 | DRG: 871 | Disposition: A | Discharge status: Home Health | Payer: Medicare Other | Attending: Internal Medicine | Admitting: Internal Medicine

## 2019-02-03 DIAGNOSIS — I10 Essential (primary) hypertension: Secondary | ICD-10-CM | POA: Diagnosis not present

## 2019-02-03 DIAGNOSIS — M1990 Unspecified osteoarthritis, unspecified site: Secondary | ICD-10-CM | POA: Diagnosis present

## 2019-02-03 DIAGNOSIS — E785 Hyperlipidemia, unspecified: Secondary | ICD-10-CM | POA: Diagnosis present

## 2019-02-03 DIAGNOSIS — Z87891 Personal history of nicotine dependence: Secondary | ICD-10-CM | POA: Diagnosis not present

## 2019-02-03 DIAGNOSIS — A419 Sepsis, unspecified organism: Secondary | ICD-10-CM | POA: Diagnosis not present

## 2019-02-03 DIAGNOSIS — K219 Gastro-esophageal reflux disease without esophagitis: Secondary | ICD-10-CM | POA: Diagnosis present

## 2019-02-03 DIAGNOSIS — N1832 Chronic kidney disease, stage 3b: Secondary | ICD-10-CM | POA: Diagnosis present

## 2019-02-03 DIAGNOSIS — I509 Heart failure, unspecified: Secondary | ICD-10-CM | POA: Diagnosis present

## 2019-02-03 DIAGNOSIS — Z20828 Contact with and (suspected) exposure to other viral communicable diseases: Secondary | ICD-10-CM | POA: Diagnosis present

## 2019-02-03 DIAGNOSIS — Z811 Family history of alcohol abuse and dependence: Secondary | ICD-10-CM | POA: Diagnosis not present

## 2019-02-03 DIAGNOSIS — J189 Pneumonia, unspecified organism: Secondary | ICD-10-CM | POA: Diagnosis not present

## 2019-02-03 DIAGNOSIS — R0602 Shortness of breath: Secondary | ICD-10-CM | POA: Diagnosis not present

## 2019-02-03 DIAGNOSIS — J440 Chronic obstructive pulmonary disease with acute lower respiratory infection: Secondary | ICD-10-CM | POA: Diagnosis present

## 2019-02-03 DIAGNOSIS — I130 Hypertensive heart and chronic kidney disease with heart failure and stage 1 through stage 4 chronic kidney disease, or unspecified chronic kidney disease: Secondary | ICD-10-CM | POA: Diagnosis present

## 2019-02-03 DIAGNOSIS — E7800 Pure hypercholesterolemia, unspecified: Secondary | ICD-10-CM | POA: Diagnosis present

## 2019-02-03 DIAGNOSIS — R6520 Severe sepsis without septic shock: Secondary | ICD-10-CM | POA: Diagnosis not present

## 2019-02-03 DIAGNOSIS — M5410 Radiculopathy, site unspecified: Secondary | ICD-10-CM | POA: Diagnosis present

## 2019-02-03 DIAGNOSIS — N179 Acute kidney failure, unspecified: Secondary | ICD-10-CM

## 2019-02-03 DIAGNOSIS — J9601 Acute respiratory failure with hypoxia: Secondary | ICD-10-CM | POA: Diagnosis present

## 2019-02-03 DIAGNOSIS — Z96659 Presence of unspecified artificial knee joint: Secondary | ICD-10-CM | POA: Diagnosis present

## 2019-02-03 DIAGNOSIS — Z8249 Family history of ischemic heart disease and other diseases of the circulatory system: Secondary | ICD-10-CM

## 2019-02-03 DIAGNOSIS — Z23 Encounter for immunization: Secondary | ICD-10-CM

## 2019-02-03 DIAGNOSIS — Z7901 Long term (current) use of anticoagulants: Secondary | ICD-10-CM | POA: Diagnosis not present

## 2019-02-03 DIAGNOSIS — R0902 Hypoxemia: Secondary | ICD-10-CM | POA: Diagnosis not present

## 2019-02-03 DIAGNOSIS — G8929 Other chronic pain: Secondary | ICD-10-CM | POA: Diagnosis present

## 2019-02-03 DIAGNOSIS — I480 Paroxysmal atrial fibrillation: Secondary | ICD-10-CM | POA: Diagnosis present

## 2019-02-03 DIAGNOSIS — R05 Cough: Secondary | ICD-10-CM | POA: Diagnosis not present

## 2019-02-03 DIAGNOSIS — I429 Cardiomyopathy, unspecified: Secondary | ICD-10-CM | POA: Diagnosis present

## 2019-02-03 DIAGNOSIS — J9801 Acute bronchospasm: Secondary | ICD-10-CM | POA: Diagnosis present

## 2019-02-03 LAB — LACTIC ACID, PLASMA
Lactic Acid, Venous: 2.5 mmol/L (ref 0.5–1.9)
Lactic Acid, Venous: 0.9 mmol/L (ref 0.5–1.9)

## 2019-02-03 LAB — COMPREHENSIVE METABOLIC PANEL
ALT: 21 U/L (ref 0–44)
AST: 26 U/L (ref 15–41)
Albumin: 3 g/dL — ABNORMAL LOW (ref 3.5–5.0)
Alkaline Phosphatase: 85 U/L (ref 38–126)
Anion gap: 12 (ref 5–15)
BUN: 39 mg/dL — ABNORMAL HIGH (ref 8–23)
CO2: 16 mmol/L — ABNORMAL LOW (ref 22–32)
Calcium: 8.4 mg/dL — ABNORMAL LOW (ref 8.9–10.3)
Chloride: 106 mmol/L (ref 98–111)
Creatinine, Ser: 1.86 mg/dL — ABNORMAL HIGH (ref 0.44–1.00)
GFR calc Af Amer: 28 mL/min — ABNORMAL LOW (ref >60)
GFR calc non Af Amer: 24 mL/min — ABNORMAL LOW (ref >60)
Glucose, Bld: 147 mg/dL — ABNORMAL HIGH (ref 70–99)
Potassium: 4.1 mmol/L (ref 3.5–5.1)
Sodium: 134 mmol/L — ABNORMAL LOW (ref 135–145)
Total Bilirubin: 0.5 mg/dL (ref 0.3–1.2)
Total Protein: 6.7 g/dL (ref 6.5–8.1)

## 2019-02-03 LAB — TROPONIN I (HIGH SENSITIVITY)
Troponin I (High Sensitivity): 9 ng/L (ref <18)
Troponin I (High Sensitivity): 9 ng/L (ref <18)

## 2019-02-03 LAB — CBC WITH DIFFERENTIAL/PLATELET
Abs Immature Granulocytes: 0.1 10*3/uL — ABNORMAL HIGH (ref 0.00–0.07)
Basophils Absolute: 0.1 10*3/uL (ref 0.0–0.1)
Basophils Relative: 1 %
Eosinophils Absolute: 0 10*3/uL (ref 0.0–0.5)
Eosinophils Relative: 0 %
HCT: 32.5 % — ABNORMAL LOW (ref 36.0–46.0)
Hemoglobin: 10.2 g/dL — ABNORMAL LOW (ref 12.0–15.0)
Immature Granulocytes: 1 %
Lymphocytes Relative: 10 %
Lymphs Abs: 1.5 10*3/uL (ref 0.7–4.0)
MCH: 26.4 pg (ref 26.0–34.0)
MCHC: 31.4 g/dL (ref 30.0–36.0)
MCV: 84 fL (ref 80.0–100.0)
Monocytes Absolute: 1.6 10*3/uL — ABNORMAL HIGH (ref 0.1–1.0)
Monocytes Relative: 11 %
Neutro Abs: 11.7 10*3/uL — ABNORMAL HIGH (ref 1.7–7.7)
Neutrophils Relative %: 77 %
Platelets: 253 10*3/uL (ref 150–400)
RBC: 3.87 MIL/uL (ref 3.87–5.11)
RDW: 19.9 % — ABNORMAL HIGH (ref 11.5–15.5)
WBC: 15 10*3/uL — ABNORMAL HIGH (ref 4.0–10.5)
nRBC: 0 % (ref 0.0–0.2)

## 2019-02-03 LAB — URINALYSIS, ROUTINE W REFLEX MICROSCOPIC
Bilirubin Urine: NEGATIVE
Glucose, UA: NEGATIVE mg/dL
Hgb urine dipstick: NEGATIVE
Ketones, ur: NEGATIVE mg/dL
Leukocytes,Ua: NEGATIVE
Nitrite: NEGATIVE
Protein, ur: NEGATIVE mg/dL
Specific Gravity, Urine: 1.01 (ref 1.005–1.030)
pH: 5.5 (ref 5.0–8.0)

## 2019-02-03 LAB — PROTIME-INR
INR: 2 — ABNORMAL HIGH (ref 0.8–1.2)
Prothrombin Time: 22.3 seconds — ABNORMAL HIGH (ref 11.4–15.2)

## 2019-02-03 LAB — BRAIN NATRIURETIC PEPTIDE: B Natriuretic Peptide: 348.8 pg/mL — ABNORMAL HIGH (ref 0.0–100.0)

## 2019-02-03 LAB — SARS CORONAVIRUS 2 BY RT PCR (HOSPITAL ORDER, PERFORMED IN ~~LOC~~ HOSPITAL LAB): SARS Coronavirus 2: NEGATIVE

## 2019-02-03 LAB — APTT: aPTT: 49 seconds — ABNORMAL HIGH (ref 24–36)

## 2019-02-03 MED ORDER — DOXYCYCLINE HYCLATE 100 MG PO TABS: 100.0000 mg | ORAL_TABLET | Freq: Two times a day (BID) | ORAL | Status: DC

## 2019-02-03 MED ORDER — SODIUM CHLORIDE 0.9 % IV SOLN
500.0000 mg | INTRAVENOUS | Status: DC
  Administered 2019-02-03: 500 mg via INTRAVENOUS
  Filled 2019-02-03 (×3): qty 500

## 2019-02-03 MED ORDER — APIXABAN 2.5 MG PO TABS
2.5000 mg | ORAL_TABLET | Freq: Two times a day (BID) | ORAL | Status: DC
  Administered 2019-02-03 – 2019-02-07 (×8): 2.5 mg via ORAL
  Filled 2019-02-03 (×8): qty 1

## 2019-02-03 MED ORDER — SODIUM CHLORIDE 0.9 % IV SOLN
1.5000 g | Freq: Two times a day (BID) | INTRAVENOUS | Status: DC
  Administered 2019-02-03 – 2019-02-04 (×2): 1.5 g via INTRAVENOUS
  Filled 2019-02-03 (×2): qty 1.5
  Filled 2019-02-03 (×3): qty 4

## 2019-02-03 MED ORDER — SODIUM CHLORIDE 0.9 % IV SOLN
2.0000 g | INTRAVENOUS | Status: DC
  Administered 2019-02-03: 2 g via INTRAVENOUS
  Filled 2019-02-03 (×2): qty 20

## 2019-02-03 MED ORDER — INFLUENZA VAC A&B SA ADJ QUAD 0.5 ML IM PRSY
0.5000 mL | PREFILLED_SYRINGE | INTRAMUSCULAR | Status: AC
  Administered 2019-02-04: 0.5 mL via INTRAMUSCULAR
  Filled 2019-02-03: qty 0.5

## 2019-02-03 MED ORDER — PANTOPRAZOLE SODIUM 40 MG PO TBEC
40.0000 mg | DELAYED_RELEASE_TABLET | Freq: Every day | ORAL | Status: DC
  Administered 2019-02-04 – 2019-02-05 (×2): 40 mg via ORAL
  Filled 2019-02-03 (×2): qty 1

## 2019-02-03 MED ORDER — PRAMIPEXOLE DIHYDROCHLORIDE 0.125 MG PO TABS
0.1250 mg | ORAL_TABLET | Freq: Once | ORAL | Status: DC
  Filled 2019-02-03: qty 1

## 2019-02-03 MED ORDER — HYDRALAZINE HCL 20 MG/ML IJ SOLN: 10.0000 mg | Freq: Four times a day (QID) | INTRAMUSCULAR | Status: DC | PRN

## 2019-02-03 MED ORDER — ATORVASTATIN CALCIUM 20 MG PO TABS
20.0000 mg | ORAL_TABLET | Freq: Every day | ORAL | Status: DC
  Filled 2019-02-03: qty 1

## 2019-02-03 MED ORDER — AMLODIPINE BESYLATE 5 MG PO TABS
2.5000 mg | ORAL_TABLET | Freq: Every day | ORAL | Status: DC
  Administered 2019-02-04 – 2019-02-07 (×4): 2.5 mg via ORAL
  Filled 2019-02-03 (×4): qty 1

## 2019-02-03 MED ORDER — TRAMADOL HCL 50 MG PO TABS: 50.0000 mg | ORAL_TABLET | Freq: Every evening | ORAL | Status: DC | PRN

## 2019-02-03 MED ORDER — UMECLIDINIUM-VILANTEROL 62.5-25 MCG/INH IN AEPB
1.0000 | INHALATION_SPRAY | Freq: Every day | RESPIRATORY_TRACT | Status: DC
  Administered 2019-02-04 – 2019-02-07 (×4): 1 via RESPIRATORY_TRACT
  Filled 2019-02-03: qty 14

## 2019-02-03 MED ORDER — SODIUM CHLORIDE 0.9 % IV SOLN
INTRAVENOUS | Status: DC
  Administered 2019-02-03 – 2019-02-04 (×2): via INTRAVENOUS

## 2019-02-03 MED ORDER — LORATADINE 10 MG PO TABS
10.0000 mg | ORAL_TABLET | Freq: Every day | ORAL | Status: DC
  Administered 2019-02-04 – 2019-02-07 (×4): 10 mg via ORAL
  Filled 2019-02-03 (×4): qty 1

## 2019-02-03 MED ORDER — GUAIFENESIN-DM 100-10 MG/5ML PO SYRP
5.0000 mL | ORAL_SOLUTION | ORAL | Status: DC | PRN
  Administered 2019-02-03 – 2019-02-06 (×7): 5 mL via ORAL
  Filled 2019-02-03 (×7): qty 10

## 2019-02-03 MED ORDER — PANTOPRAZOLE SODIUM 40 MG PO TBEC: 40.0000 mg | DELAYED_RELEASE_TABLET | Freq: Once | ORAL | Status: DC

## 2019-02-03 MED ORDER — METOPROLOL SUCCINATE ER 25 MG PO TB24
25.0000 mg | ORAL_TABLET | Freq: Every day | ORAL | Status: DC
  Administered 2019-02-03 – 2019-02-04 (×2): 25 mg via ORAL
  Filled 2019-02-03 (×2): qty 1

## 2019-02-03 MED ORDER — SODIUM CHLORIDE 0.9 % IV BOLUS
1000.0000 mL | Freq: Once | INTRAVENOUS | Status: AC
  Administered 2019-02-03: 1000 mL via INTRAVENOUS

## 2019-02-03 MED ORDER — HYDRALAZINE HCL 25 MG PO TABS
25.0000 mg | ORAL_TABLET | Freq: Once | ORAL | Status: AC
  Administered 2019-02-03: 25 mg via ORAL
  Filled 2019-02-03: qty 1

## 2019-02-03 MED ORDER — TRAMADOL HCL 50 MG PO TABS: 50.0000 mg | ORAL_TABLET | Freq: Four times a day (QID) | ORAL | Status: DC | PRN

## 2019-02-03 MED ORDER — ALBUTEROL SULFATE (2.5 MG/3ML) 0.083% IN NEBU: 2.5000 mg | INHALATION_SOLUTION | Freq: Four times a day (QID) | RESPIRATORY_TRACT | Status: DC | PRN

## 2019-02-03 MED ORDER — PRAMIPEXOLE DIHYDROCHLORIDE 0.125 MG PO TABS
0.1250 mg | ORAL_TABLET | Freq: Every day | ORAL | Status: DC
  Administered 2019-02-03 – 2019-02-06 (×4): 0.125 mg via ORAL
  Filled 2019-02-03 (×6): qty 1

## 2019-02-03 MED ORDER — APIXABAN 2.5 MG PO TABS: 2.5000 mg | ORAL_TABLET | Freq: Once | ORAL | Status: DC

## 2019-02-03 MED ORDER — ATORVASTATIN CALCIUM 20 MG PO TABS
20.0000 mg | ORAL_TABLET | Freq: Every day | ORAL | Status: DC
  Administered 2019-02-03 – 2019-02-06 (×4): 20 mg via ORAL
  Filled 2019-02-03 (×4): qty 1

## 2019-02-03 NOTE — ED Notes (Signed)
[facility] arrived to transport pt to [facility]. VSS. 

## 2019-02-03 NOTE — ED Notes (Signed)
ED TO INPATIENT HANDOFF REPORT  ED Nurse Name and Phone #: Shelda Pal [place] Name/Age/Gender Judith Boone 85 y.o. female Room/Bed: MH07/MH07  Code Status   Code Status: Prior  Home/SNF/Other Home Patient oriented to: self, place, time and situation Is this baseline? Yes   Triage Complete: Triage complete  Chief Complaint shortness of breath, cough and fever  Triage Note Pt reports fevers up to 101.8 at home for 12 days, over the last 2 days had developed cough and increasing sob. ekg performed while pt being triaged, iv access obtained, pt is awake and alert, cooperative with care. Rt to bedside for assessment.    Allergies Allergies  Allergen Reactions  . Bacitracin     unknown  . Neosporin [Neomycin-Bacitracin Zn-Polymyx]     unknown  . Polysporin [Bacitracin-Polymyxin B]     unknown    Level of Care/Admitting Diagnosis ED Disposition    ED Disposition Condition Comment   Admit  Hospital Area: [place] [[id number]]  Level of Care: Telemetry [5]  Admit to tele based on following criteria: Complex arrhythmia (Bradycardia/Tachycardia)  Covid Evaluation: Confirmed COVID Negative  Diagnosis: Sepsis [facility]) [[id number]]  Admitting Physician: Lavina Hamman [[id number]]  Attending Physician: Lavina Hamman [[id number]]  Estimated length of stay: past midnight tomorrow  Certification:: I certify this patient will need inpatient services for at least 2 midnights  PT Class (Do Not Modify): Inpatient [101]  PT Acc Code (Do Not Modify): Private [1]       B Medical/Surgery History Past Medical History:  Diagnosis Date  . Allergy    allergic rhinitis  . Arthritis   . Cellulitis of left leg 07-2014   hospitalized for 4 days  . COPD (chronic obstructive pulmonary disease) ([place])   . GERD (gastroesophageal reflux disease)   . History of granulomatous disease   . Hyperlipidemia   . Hypertension   . Left leg swelling   . Right-sided low back pain with  sciatica    Past Surgical History:  Procedure Laterality Date  . ABDOMINAL HYSTERECTOMY    . broken ankle repair     left  . CATARACT EXTRACTION    . TOTAL KNEE ARTHROPLASTY     right     A IV Location/Drains/Wounds Patient Lines/Drains/Airways Status   Active Line/Drains/Airways    Name:   Placement date:   Placement time:   Site:   Days:   Peripheral IV 02/03/19 Left Forearm   02/03/19    1222    Forearm   less than 1   Peripheral IV 02/03/19 Right Forearm   02/03/19    1222    Forearm   less than 1   Wound / Incision (Open or Dehisced) 11/04/17 Other (Comment) Knee Left   11/04/17    1556    Knee   456          Intake/Output Last 24 hours  Intake/Output Summary (Last 24 hours) at 02/03/2019 1526 Last data filed at 02/03/2019 1431 Gross per 24 hour  Intake 1100 ml  Output -  Net 1100 ml    Labs/Imaging Results for orders placed or performed during the hospital encounter of 02/03/19 (from the past 48 hour(s))  Comprehensive metabolic panel     Status: Abnormal   Collection Time: 02/03/19 12:23 PM  Result Value Ref Range   Sodium 134 (L) 135 - 145 mmol/L   Potassium 4.1 3.5 - 5.1 mmol/L   Chloride 106 98 - 111 mmol/L  CO2 16 (L) 22 - 32 mmol/L   Glucose, Bld 147 (H) 70 - 99 mg/dL   BUN 39 (H) 8 - 23 mg/dL   Creatinine, Ser 1.86 (H) 0.44 - 1.00 mg/dL   Calcium 8.4 (L) 8.9 - 10.3 mg/dL   Total Protein 6.7 6.5 - 8.1 g/dL   Albumin 3.0 (L) 3.5 - 5.0 g/dL   AST 26 15 - 41 U/L   ALT 21 0 - 44 U/L   Alkaline Phosphatase 85 38 - 126 U/L   Total Bilirubin 0.5 0.3 - 1.2 mg/dL   GFR calc non Af Amer 24 (L) >60 mL/min   GFR calc Af Amer 28 (L) >60 mL/min   Anion gap 12 5 - 15    Comment: Performed at [facility], [address] [place], [place] [address]  CBC with Differential     Status: Abnormal   Collection Time: 02/03/19 12:23 PM  Result Value Ref Range   WBC 15.0 (H) 4.0 - 10.5 K/uL   RBC 3.87 3.87 - 5.11 MIL/uL   Hemoglobin 10.2 (L) 12.0 - 15.0  g/dL   HCT 32.5 (L) 36.0 - 46.0 %   MCV 84.0 80.0 - 100.0 fL   MCH 26.4 26.0 - 34.0 pg   MCHC 31.4 30.0 - 36.0 g/dL   RDW 19.9 (H) 11.5 - 15.5 %   Platelets 253 150 - 400 K/uL   nRBC 0.0 0.0 - 0.2 %   Neutrophils Relative % 77 %   Neutro Abs 11.7 (H) 1.7 - 7.7 K/uL   Lymphocytes Relative 10 %   Lymphs Abs 1.5 0.7 - 4.0 K/uL   Monocytes Relative 11 %   Monocytes Absolute 1.6 (H) 0.1 - 1.0 K/uL   Eosinophils Relative 0 %   Eosinophils Absolute 0.0 0.0 - 0.5 K/uL   Basophils Relative 1 %   Basophils Absolute 0.1 0.0 - 0.1 K/uL   Immature Granulocytes 1 %   Abs Immature Granulocytes 0.10 (H) 0.00 - 0.07 K/uL    Comment: Performed at [facility], [address] [place], [place] [address]  SARS Coronavirus 2 [facility] order, Performed in [facility] hospital lab) Nasopharyngeal Nasopharyngeal Swab     Status: None   Collection Time: 02/03/19 12:23 PM   Specimen: Nasopharyngeal Swab  Result Value Ref Range   SARS Coronavirus 2 NEGATIVE NEGATIVE    Comment: (NOTE) If result is NEGATIVE SARS-CoV-2 target nucleic acids are NOT DETECTED. The SARS-CoV-2 RNA is generally detectable in upper and lower  respiratory specimens during the acute phase of infection. The lowest  concentration of SARS-CoV-2 viral copies this assay can detect is 250  copies / mL. A negative result does not preclude SARS-CoV-2 infection  and should not be used as the sole basis for treatment or other  patient management decisions.  A negative result may occur with  improper specimen collection / handling, submission of specimen other  than nasopharyngeal swab, presence of viral mutation(s) within the  areas targeted by this assay, and inadequate number of viral copies  (<250 copies / mL). A negative result must be combined with clinical  observations, patient history, and epidemiological information. If result is POSITIVE SARS-CoV-2 target nucleic acids are DETECTED. The SARS-CoV-2 RNA is generally  detectable in upper and lower  respiratory specimens dur ing the acute phase of infection.  Positive  results are indicative of active infection with SARS-CoV-2.  Clinical  correlation with patient history and other diagnostic information  is  necessary to determine patient infection status.  Positive results do  not rule out bacterial infection or co-infection with other viruses. If result is PRESUMPTIVE POSTIVE SARS-CoV-2 nucleic acids MAY BE PRESENT.   A presumptive positive result was obtained on the submitted specimen  and confirmed on repeat testing.  While 2019 novel coronavirus  (SARS-CoV-2) nucleic acids may be present in the submitted sample  additional confirmatory testing may be necessary for epidemiological  and / or clinical management purposes  to differentiate between  SARS-CoV-2 and other Sarbecovirus currently known to infect humans.  If clinically indicated additional testing with an alternate test  methodology [phone]) is advised. The SARS-CoV-2 RNA is generally  detectable in upper and lower respiratory sp ecimens during the acute  phase of infection. The expected result is Negative. Fact Sheet for Patients:  StrictlyIdeas.no Fact Sheet for Healthcare Providers: BankingDealers.co.za This test is not yet approved or cleared by the [place] FDA and has been authorized for detection and/or diagnosis of SARS-CoV-2 by FDA under an Emergency Use Authorization (EUA).  This EUA will remain in effect (meaning this test can be used) for the duration of the COVID-19 declaration under Section 564(b)(1) of the Act, 21 U.S.C. section 360bbb-3(b)(1), unless the authorization is terminated or revoked sooner. Performed at [facility], [address] [place], [place] [address]   Troponin I (High Sensitivity)     Status: None   Collection Time: 02/03/19 12:23 PM  Result Value Ref Range   Troponin I (High  Sensitivity) 9 <18 ng/L    Comment: (NOTE) Elevated high sensitivity troponin I (hsTnI) values and significant  changes across serial measurements may suggest ACS but many other  chronic and acute conditions are known to elevate hsTnI results.  Refer to the "Links" section for chest pain algorithms and additional  guidance. Performed at [facility], [address] [place], [place] [address]   Lactic acid, plasma     Status: Abnormal   Collection Time: 02/03/19 12:23 PM  Result Value Ref Range   Lactic Acid, Venous 2.5 (HH) 0.5 - 1.9 mmol/L    Comment: CRITICAL RESULT CALLED TO, READ BACK BY AND VERIFIED WITH: AMY HARTLEY @1259  02/03/2019 OLSONM Performed at [facility], [address], [place], [place] [address]   Brain natriuretic peptide     Status: Abnormal   Collection Time: 02/03/19 12:23 PM  Result Value Ref Range   B Natriuretic Peptide 348.8 (H) 0.0 - 100.0 pg/mL    Comment: Performed at [facility], [address] [place], [place] [address]  Protime-INR     Status: Abnormal   Collection Time: 02/03/19 12:23 PM  Result Value Ref Range   Prothrombin Time 22.3 (H) 11.4 - 15.2 seconds   INR 2.0 (H) 0.8 - 1.2    Comment: (NOTE) INR goal varies based on device and disease states. Performed at [facility], [address] [place], [place] [address]   APTT     Status: Abnormal   Collection Time: 02/03/19 12:23 PM  Result Value Ref Range   aPTT 49 (H) 24 - 36 seconds    Comment:        IF BASELINE aPTT IS ELEVATED, SUGGEST PATIENT RISK ASSESSMENT BE USED TO DETERMINE APPROPRIATE ANTICOAGULANT THERAPY. Performed at [facility], [address] [place], [place] [address]   Urinalysis, Routine w reflex microscopic     Status:  None   Collection Time: 02/03/19 12:35 PM  Result Value Ref Range   Color, Urine YELLOW YELLOW   APPearance CLEAR CLEAR   Specific Gravity, Urine 1.010 1.005 - 1.030   pH 5.5 5.0 - 8.0    Glucose, UA NEGATIVE NEGATIVE mg/dL   Hgb urine dipstick NEGATIVE NEGATIVE   Bilirubin Urine NEGATIVE NEGATIVE   Ketones, ur NEGATIVE NEGATIVE mg/dL   Protein, ur NEGATIVE NEGATIVE mg/dL   Nitrite NEGATIVE NEGATIVE   Leukocytes,Ua NEGATIVE NEGATIVE    Comment: Microscopic not done on urines with negative protein, blood, leukocytes, nitrite, or glucose < 500 mg/dL. Performed at [facility], [address], [place], [place] [address]    Dg Chest Portable 1 View  Result Date: 02/03/2019 CLINICAL DATA:  Cough and shortness of breath EXAM: PORTABLE CHEST 1 VIEW COMPARISON:  07/17/2018 FINDINGS: Cardiac shadow is stable. Aortic calcifications are again seen. Hiatal hernia is again noted. Patchy infiltrate is noted primarily within the right mid lung but extending into the right lung base. The left lung is clear. No bony abnormality is seen. IMPRESSION: Patchy infiltrate on the left primarily in the mid lung laterally. Electronically Signed   By: Inez Catalina M.D.   On: 02/03/2019 13:32    Pending Labs Unresulted Labs (From admission, onward)    Start     Ordered   02/03/19 1500  Lactic acid, plasma  Now then every 2 hours,   STAT     02/03/19 1321   02/03/19 1317  Blood Culture (routine x 2)  BLOOD CULTURE X 2,   STAT     02/03/19 1321   02/03/19 1225  Lactic acid, plasma  Now then every 2 hours,   STAT     02/03/19 1224          Vitals/Pain Today's Vitals   02/03/19 1328 02/03/19 1330 02/03/19 1430 02/03/19 1500  BP: 98/68 (!) 112/47 (!) 134/102 (!) 93/59  Pulse: 74  86 80  Resp: (!) 23 (!) 23 (!) 26 19  Temp:      TempSrc:      SpO2: 97% 97% 100% 99%  Weight:      PainSc:        Isolation Precautions No active isolations  Medications Medications  cefTRIAXone (ROCEPHIN) 2 g in sodium chloride 0.9 % 100 mL IVPB (0 g Intravenous Stopped 02/03/19 1431)  azithromycin (ZITHROMAX) 500 mg in sodium chloride 0.9 % 250 mL IVPB (500 mg Intravenous New Bag/Given 02/03/19  1420)  apixaban (ELIQUIS) tablet 2.5 mg (has no administration in time range)  hydrALAZINE (APRESOLINE) tablet 25 mg (has no administration in time range)  atorvastatin (LIPITOR) tablet 20 mg (has no administration in time range)  traMADol (ULTRAM) tablet 50 mg (has no administration in time range)  pramipexole (MIRAPEX) tablet 0.125 mg (has no administration in time range)  pantoprazole (PROTONIX) EC tablet 40 mg (has no administration in time range)  sodium chloride 0.9 % bolus 1,000 mL (0 mLs Intravenous Stopped 02/03/19 1353)    Mobility walks with device Low fall risk   Focused Assessments Cardiac Assessment Handoff:  Cardiac Rhythm: Normal sinus rhythm Lab Results  Component Value Date   TROPONINI 0.03 08/16/2014   No results found for: DDIMER Does the Patient currently have chest pain? No     R Recommendations: See Admitting Provider Note  Report given to:   Additional Notes: Pt on 2L nasal cannula, dyspnea with exertion- uses bedpan or bedside commode.

## 2019-02-03 NOTE — H&P (Addendum)
History and Physical    DOA: 02/03/2019  PCP: Velna Hatchet, MD  Patient coming from: home  Chief Complaint: dyspnea, fever  HPI: Judith Boone is a 85 y.o. female with history h/o 85 y.o. female with a history of paroxysmal A. fib, on metoprolol and Eliquis, COPD (not on home oxygen), hypertension, high cholesterol, presenting to the [facility] with c/o intermittent fever (Tmax 101.8) , myalgias for 10 days , dry cough x 3 days and shortness of breath since this morning unrelieved by home neb Rx. She denies any chest pain or swelling in her legs or weight gain. She states that she lives alone is been following social distancing/standard precuations with ongoing COVID pandemic. No known exposures.  She lives alone and only goes out for groceries once a week.Son lives close by and checks on her frequently.  W/U in [facility] ED: Her pulse ox was 92% on room air on, initial presentation with tachypnea, WBC 15K and lactate level at 2.5.BUN 39, creat 1.86. Her BNP was elevated , hence received judicious IV fluids-I lit at outside ED. COVID -ve. CXR revealed Patchy infiltrate on the right base/ mid lung. She received IV ceftriaxone/ Zithromycin. Hospitalist service consulted for admission and patient transferred to [facility]   She is currently on 2lits [place] and saturating well. She is noted to have dry cough during the interview. She denies any dysphagia or coughing with food intake. No h/o sleep apnea but takes Tramadol at night to help with arthritis pain/sleep.    Review of Systems: As per HPI otherwise 10 point review of systems negative.    Past Medical History:  Diagnosis Date  . Allergy    allergic rhinitis  . Arthritis   . Cellulitis of left leg 07-2014   hospitalized for 4 days  . COPD (chronic obstructive pulmonary disease) ([place])   . GERD (gastroesophageal reflux disease)   . History of granulomatous disease   . Hyperlipidemia   . Hypertension   . Left leg swelling   . Right-sided low back  pain with sciatica     Past Surgical History:  Procedure Laterality Date  . ABDOMINAL HYSTERECTOMY    . broken ankle repair     left  . CATARACT EXTRACTION    . TOTAL KNEE ARTHROPLASTY     right    Social history:  reports that she quit smoking about 39 years ago. She has never used smokeless tobacco. She reports current alcohol use. She reports that she does not use drugs.   Allergies  Allergen Reactions  . Bacitracin     unknown  . Neosporin [Neomycin-Bacitracin Zn-Polymyx]     unknown  . Polysporin [Bacitracin-Polymyxin B]     unknown    Family History  Problem Relation Age of Onset  . Alcohol abuse Father   . Heart disease Father   . Hypertension Father   . Hypertension Daughter   . Hypertension Son       Prior to Admission medications   Medication Sig Start Date End Date Taking? Authorizing Provider  amLODipine (NORVASC) 5 MG tablet Take 5 mg by mouth daily. 04/13/15   [provider]  ANORO ELLIPTA 62.5-25 MCG/INH AEPB Inhale 1 puff into the lungs daily. 07/04/18   [provider]  apixaban (ELIQUIS) 2.5 MG TABS tablet Take 1 tablet (2.5 mg total) by mouth 2 (two) times daily for 30 days. 07/23/18 11/24/18  Fenton, Clint R, PA  atorvastatin (LIPITOR) 20 MG tablet Take 20 mg by mouth daily  at 6 PM.     [provider]  cetirizine (ZYRTEC) 10 MG tablet Take 10 mg by mouth daily.    [provider]  famotidine (PEPCID) 20 MG tablet Take 20 mg by mouth daily.    [provider]  hydrALAZINE (APRESOLINE) 25 MG tablet Take 25 mg by mouth 2 (two) times daily.     [provider]  metoprolol succinate (TOPROL-XL) 25 MG 24 hr tablet Take 1 tablet (25 mg total) by mouth daily. 07/23/18   Fenton, Clint R, PA  omeprazole (PRILOSEC) 40 MG capsule 1 capsule daily. 07/22/18   [provider]  pramipexole (MIRAPEX) 0.125 MG tablet Take 0.125 mg by mouth at bedtime. 02/09/15   [provider]  quinapril  (ACCUPRIL) 40 MG tablet Take 40 mg by mouth at bedtime.    [provider]  traMADol (ULTRAM) 50 MG tablet Take 50 mg by mouth every 6 (six) hours as needed for moderate pain.     [provider]  VENTOLIN HFA 108 (90 BASE) MCG/ACT inhaler Inhale 1-2 puffs into the lungs every 6 (six) hours as needed for wheezing.  03/03/15   [provider]    Physical Exam: Vitals:   02/03/19 1500 02/03/19 1530 02/03/19 1600 02/03/19 1630  BP: (!) 93/59 106/63 (!) 109/56 (!) 115/55  Pulse: 80 76 73 80  Resp: 19 20 (!) 22 (!) 22  Temp:      TempSrc:      SpO2: 99% 97% 97% 99%  Weight:        Constitutional: In mild distress due to cough Eyes: PERRL, lids and conjunctivae normal ENMT: Mucous membranes are moist. Posterior pharynx clear of any exudate or lesions.Normal dentition.  Neck: normal, supple, no masses, no thyromegaly Respiratory: clear to auscultation bilaterally, no wheezing, no crackles. Normal respiratory effort. No accessory muscle use.  Cardiovascular: Regular rate and rhythm, no murmurs / rubs / gallops. No extremity edema. 2+ pedal pulses. No carotid bruits.  Abdomen: no tenderness, no masses palpated. No hepatosplenomegaly. Bowel sounds positive.  Musculoskeletal: no clubbing / cyanosis. No joint deformity upper and lower extremities. Good ROM, no contractures. Normal muscle tone.  Neurologic: CN 2-12 grossly intact. Sensation intact, DTR normal. Strength 5/5 in all 4.  Psychiatric: Normal judgment and insight. Alert and oriented x 3. Normal mood.  SKIN/catheters: no rashes, lesions, ulcers. No induration  Labs on Admission: I have personally reviewed following labs and imaging studies  CBC: Recent Labs  Lab 02/03/19 1223  WBC 15.0*  NEUTROABS 11.7*  HGB 10.2*  HCT 32.5*  MCV 84.0  PLT 147   Basic Metabolic Panel: Recent Labs  Lab 02/03/19 1223  NA 134*  K 4.1  CL 106  CO2 16*  GLUCOSE 147*  BUN 39*  CREATININE 1.86*  CALCIUM 8.4*    GFR: Estimated Creatinine Clearance: 18.6 mL/min (A) (by C-G formula based on SCr of 1.86 mg/dL (H)). Liver Function Tests: Recent Labs  Lab 02/03/19 1223  AST 26  ALT 21  ALKPHOS 85  BILITOT 0.5  PROT 6.7  ALBUMIN 3.0*   No results for input(s): LIPASE, AMYLASE in the last 168 hours. No results for input(s): AMMONIA in the last 168 hours. Coagulation Profile: Recent Labs  Lab 02/03/19 1223  INR 2.0*   Cardiac Enzymes: No results for input(s): CKTOTAL, CKMB, CKMBINDEX, TROPONINI in the last 168 hours. BNP (last 3 results) No results for input(s): PROBNP in the last 8760 hours. HbA1C: No results for input(s):  HGBA1C in the last 72 hours. CBG: No results for input(s): GLUCAP in the last 168 hours. Lipid Profile: No results for input(s): CHOL, HDL, LDLCALC, TRIG, CHOLHDL, LDLDIRECT in the last 72 hours. Thyroid Function Tests: No results for input(s): TSH, T4TOTAL, FREET4, T3FREE, THYROIDAB in the last 72 hours. Anemia Panel: No results for input(s): VITAMINB12, FOLATE, FERRITIN, TIBC, IRON, RETICCTPCT in the last 72 hours. Urine analysis:    Component Value Date/Time   COLORURINE YELLOW 02/03/2019 [place] 02/03/2019 1235   LABSPEC 1.010 02/03/2019 1235   PHURINE 5.5 02/03/2019 [place] 02/03/2019 1235   HGBUR NEGATIVE 02/03/2019 [place] 02/03/2019 [place] 02/03/2019 1235   PROTEINUR NEGATIVE 02/03/2019 1235   UROBILINOGEN 0.2 08/17/2014 0000   NITRITE NEGATIVE 02/03/2019 [place] 02/03/2019 1235    Radiological Exams on Admission: Personally reviewed  Dg Chest Portable 1 View  Result Date: 02/03/2019 CLINICAL DATA:  Cough and shortness of breath EXAM: PORTABLE CHEST 1 VIEW COMPARISON:  07/17/2018 FINDINGS: Cardiac shadow is stable. Aortic calcifications are again seen. Hiatal hernia is again noted. Patchy infiltrate is noted primarily within the right mid lung but  extending into the right lung base. The left lung is clear. No bony abnormality is seen. IMPRESSION: Patchy infiltrate on the left primarily in the mid lung laterally. Electronically Signed   By: Inez Catalina M.D.   On: 02/03/2019 13:32    EKG: Independently reviewed. Sinus tachycardia, PACs with Qtc 438 msec     Assessment and Plan:   Active Problems:   Sepsis ([place])    1.  Right-sided pneumonia with sepsis syndrome and acute hypoxic respiratory failure : Present on admission.  Patient had mild hypotension with initial blood pressure recording of 93/59, leukocytosis, tachypnea, mild hypoxia (93% at rest and 86% with exertion per nurse), mild tachycardia with lactate level of 2.5 and chest x-ray showing right-sided infiltrates.  Patient reports fever at home but been afebrile since presentation. Repeat lactate level at 0.9.  Patient received 1 L IV fluids and blood pressure improved significantly.  Her systolic blood pressure was later on elevated in fact and patient was resumed on home antihypertensives.  She received 1 dose of Rocephin and azithromycin.  Given right-sided infiltrates, aspiration pneumonia on the differential.  Patient reports taking tramadol at night and does have reflux disease which may predispose her to silent aspiration.  COVID testing is negative.  Will obtain respiratory viral panel including flu testing.  Check urine Legionella and streptococcal antigen.  Will change Rocephin to Unasyn to cover aspiration organisms and consult speech therapy.  Continue atypical coverage with azithromycin (QTC 430 ms).  Antitussives/nebulizer treatments to help with bronchospasm and dry cough.  2.  AKI: In the setting of problem #1 and poor oral intake over the last 1 week.  Hold ACE inhibitor's.  Gentle IV hydration and concern for  elevated BNP and most recent echo in June showing diastolic dysfunction.  Labs in a.m.  3.  Paroxysmal atrial fibrillation: Resume metoprolol as blood pressure  better now.  Resume Eliquis.  4.  Hypertension: Patient had soft blood pressures on arrival which are now improved after hydration at outside ED.  Currently blood pressure elevated at systolic 161W to 960A.  Resume metoprolol and low-dose Norvasc.  Hold scheduled hydralazine but will have PRN available.  Hold ACE inhibitors and concern for problem #2  5.  Hyperlipidemia: Resume home medications  6.  COPD: Resume home inhaler therapy.  Albuterol neb treatments PRN available.  Currently requiring 2 L nasal cannula in the setting of problem #1, last echo did show elevated pulmonary pressures but patient not on home O2.  Taper as tolerated.    7.  GERD: Patient on PPI as well as Pepcid as outpatient  8. Chronic arthritis pain: Resume tramadol.  9. Elevated BNP: in the setting of AKI. Given fever and white count favor infectious etiology for CXR findings that CHF. No clinical signs of volume overload. Repeat level in am.   DVT prophylaxis: On Eliquis.  Of note, patient's INR at 2  COVID screen: negative  Code Status: Full code   .Health care proxy would be her son  Patient/Family Communication: Discussed with patient and all questions answered to satisfaction.  Consults called: none Admission status : I certify that at the point of admission it is my clinical judgment that the patient will require inpatient hospital care spanning beyond 2 midnights from the point of admission due to high intensity of service and high frequency of surveillance required.Inpatient status is judged to be reasonable and necessary in order to provide the required intensity of service to ensure the patient's safety. The patient's presenting symptoms, physical exam findings, and initial radiographic and laboratory data in the context of their chronic comorbidities is felt to place them at high risk for further clinical deterioration. The following factors support the patient status of inpatient : pneumonia with sepsis  syndrome/AKI/acute hypoxia Expected LOS:  3-4 days    Guilford Shi MD [facility] Pager [phone]  If 7PM-7AM, please contact night-coverage [email or web address] Password [facility]  02/03/2019, 5:47 PM

## 2019-02-03 NOTE — ED Triage Notes (Signed)
Pt reports fevers up to 101.8 at home for 12 days, over the last 2 days had developed cough and increasing sob. ekg performed while pt being triaged, iv access obtained, pt is awake and alert, cooperative with care. Rt to bedside for assessment.

## 2019-02-03 NOTE — Progress Notes (Signed)
PHARMACY NOTE:  ANTIMICROBIAL RENAL DOSAGE ADJUSTMENT  Current antimicrobial regimen includes a mismatch between antimicrobial dosage and estimated renal function.  As per policy approved by the Pharmacy & Therapeutics and Medical Executive Committees, the antimicrobial dosage will be adjusted accordingly.  Current antimicrobial dosage:  Unasyn 1.5g every 6 hours  Indication: Aspiration Pneumonia  Renal Function:  Estimated Creatinine Clearance: 19.1 mL/min (A) (by C-G formula based on SCr of 1.86 mg/dL (H)). []      On intermittent HD, scheduled: []      On CRRT    Antimicrobial dosage has been changed to: Unasyn 1.5g every 12 hours    Thank you for allowing pharmacy to be a part of this patient's care.  Darnelle Bos, [facility] 02/03/2019 8:00 PM

## 2019-02-03 NOTE — ED Notes (Signed)
Pt assisted to BR with oxygen. Dyspnea with exertion- SpO2 dropped to 85% on 2L. Pt assisted back to bed and oxygen increased to 4L until 96% was reached. Pt currently in NAD and SpO2 97% on 2L.

## 2019-02-03 NOTE — ED Notes (Signed)
Pt on monitor 

## 2019-02-03 NOTE — ED Provider Notes (Signed)
[facility] EMERGENCY DEPARTMENT Provider Note   CSN: [id number] Arrival date & time: 02/03/19  1156     History   Chief Complaint Chief Complaint  Patient presents with  . Shortness of Breath    HPI Judith Boone is a 85 y.o. female with a history of paroxysmal A. fib, on metoprolol and Eliquis, COPD (not on home oxygen, hypertension, high cholesterol, presenting to the emergency department with fever myalgias and shortness of breath.  She reports that her symptoms began 11 days ago, when she woke up in the morning that Sunday feeling feverish and weak.  She reports that throughout the course of last week she continued to feel like she was having fevers and chills, with a T-max of 101.8 Fahrenheit at home.  She reports that 3 days ago she began to have a cough and dyspnea on exertion.  She says the shortness of breath is worse with exertion and better at rest.  It is not associated with lying flat.  She denies any swelling in her legs.  She denies any recent weight gain.  She reports having congestion for several days but denies any headache, sore throat, nausea or vomiting, diarrhea.  She denies any dysuria, hematuria.  She denies any flank pain.  She denies any new rashes or open wounds.  She denies any chest pain or pressure.  She denies any history of cardiac stents or MI.  She states that she lives alone is been keeping fairly isolated for the past month.  She only goes out for groceries once a week.  In terms of her COPD she does use breathing treatments at home and to try today with minimal relief.  She uses Ellipta as well as an albuterol pump.  She denies any recent antibiotics.  She reports the last time she was diagnosed with pneumonia was during childhood.     HPI  Past Medical History:  Diagnosis Date  . Allergy    allergic rhinitis  . Arthritis   . Cellulitis of left leg 07-2014   hospitalized for 4 days  . COPD (chronic obstructive pulmonary disease)  ([place])   . GERD (gastroesophageal reflux disease)   . History of granulomatous disease   . Hyperlipidemia   . Hypertension   . Left leg swelling   . Right-sided low back pain with sciatica     Patient Active Problem List   Diagnosis Date Noted  . CKD (chronic kidney disease) stage 3, GFR 30-59 ml/min 08/17/2014  . Sepsis ([place]) 08/17/2014  . Cellulitis of leg, left 08/16/2014  . Pain of great toe 03/23/2012  . Leg swelling 03/23/2012  . Decreased hearing 12/23/2011  . Renal insufficiency 09/27/2011  . Insomnia 05/16/2011  . Radicular leg pain 05/16/2011  . Hearing loss 12/12/2010  . HTN (hypertension) 07/02/2010  . Other and unspecified hyperlipidemia 07/02/2010  . Arthritis 07/02/2010  . GERD (gastroesophageal reflux disease) 07/02/2010  . Esophageal web 07/02/2010  . PAD (peripheral artery disease) ([place]) 07/02/2010    Past Surgical History:  Procedure Laterality Date  . ABDOMINAL HYSTERECTOMY    . broken ankle repair     left  . CATARACT EXTRACTION    . TOTAL KNEE ARTHROPLASTY     right     OB History   No obstetric history on file.      Home Medications    Prior to Admission medications   Medication Sig Start Date End Date Taking? Authorizing Provider  amLODipine (NORVASC) 5 MG tablet  Take 5 mg by mouth daily. 04/13/15   [provider]  ANORO ELLIPTA 62.5-25 MCG/INH AEPB Inhale 1 puff into the lungs daily. 07/04/18   [provider]  apixaban (ELIQUIS) 2.5 MG TABS tablet Take 1 tablet (2.5 mg total) by mouth 2 (two) times daily for 30 days. 07/23/18 11/24/18  Fenton, Clint R, PA  atorvastatin (LIPITOR) 20 MG tablet Take 20 mg by mouth daily at 6 PM.     [provider]  cetirizine (ZYRTEC) 10 MG tablet Take 10 mg by mouth daily.    [provider]  famotidine (PEPCID) 20 MG tablet Take 20 mg by mouth daily.    [provider]  hydrALAZINE (APRESOLINE) 25 MG tablet Take 25 mg by mouth 2 (two) times daily.     [provider]  metoprolol succinate (TOPROL-XL) 25 MG 24 hr tablet Take 1 tablet (25 mg total) by mouth daily. 07/23/18   Fenton, Clint R, PA  omeprazole (PRILOSEC) 40 MG capsule 1 capsule daily. 07/22/18   [provider]  pramipexole (MIRAPEX) 0.125 MG tablet Take 0.125 mg by mouth at bedtime. 02/09/15   [provider]  quinapril (ACCUPRIL) 40 MG tablet Take 40 mg by mouth at bedtime.    [provider]  traMADol (ULTRAM) 50 MG tablet Take 50 mg by mouth every 6 (six) hours as needed for moderate pain.     [provider]  VENTOLIN HFA 108 (90 BASE) MCG/ACT inhaler Inhale 1-2 puffs into the lungs every 6 (six) hours as needed for wheezing.  03/03/15   [provider]    Family History Family History  Problem Relation Age of Onset  . Alcohol abuse Father   . Heart disease Father   . Hypertension Father   . Hypertension Daughter   . Hypertension Son     Social History Social History   Tobacco Use  . Smoking status: Former Smoker    Quit date: 04/29/1979    Years since quitting: 39.7  . Smokeless tobacco: Never Used  Substance Use Topics  . Alcohol use: Yes    Alcohol/week: 0.0 standard drinks  . Drug use: No     Allergies   Bacitracin, Neosporin [neomycin-bacitracin zn-polymyx], and Polysporin [bacitracin-polymyxin b]   Review of Systems Review of Systems  Constitutional: Positive for chills, fatigue and fever.  HENT: Positive for congestion. Negative for ear pain and sore throat.   Eyes: Negative for pain and visual disturbance.  Respiratory: Positive for cough and shortness of breath.   Cardiovascular: Negative for chest pain, palpitations and leg swelling.  Gastrointestinal: Negative for abdominal pain, diarrhea, nausea and vomiting.  Genitourinary: Negative for dysuria, flank pain and hematuria.  Musculoskeletal: Negative for arthralgias and back pain.  Skin: Negative for pallor and rash.  Neurological: Negative for  syncope and headaches.  Psychiatric/Behavioral: Negative for agitation and confusion.  All other systems reviewed and are negative.    Physical Exam Updated Vital Signs BP (!) 134/102   Pulse 86   Temp 98 F (36.7 C) (Oral)   Resp (!) 26   Wt 64.9 kg   SpO2 100%   BMI 27.93 kg/m   Physical Exam Vitals signs and nursing note reviewed.  Constitutional:      General: She is not in acute distress.    Appearance: She is well-developed.  HENT:     Head: Normocephalic and atraumatic.  Eyes:     Conjunctiva/sclera: Conjunctivae normal.  Neck:     Musculoskeletal:  Neck supple.  Cardiovascular:     Rate and Rhythm: Normal rate and regular rhythm.  Pulmonary:     Effort: Pulmonary effort is normal. No accessory muscle usage or respiratory distress.     Breath sounds: Normal breath sounds. No decreased breath sounds, wheezing, rhonchi or rales.     Comments: Satting 92% on room air, improved to 98% on 2L [place] RR 26-28 Speaking comfortably in full sentences Abdominal:     Palpations: Abdomen is soft.     Tenderness: There is no abdominal tenderness.  Musculoskeletal:     Right lower leg: She exhibits no tenderness. No edema.     Left lower leg: She exhibits no tenderness. No edema.  Skin:    General: Skin is warm and dry.  Neurological:     Mental Status: She is alert.  Psychiatric:        Mood and Affect: Mood normal.        Behavior: Behavior normal.      ED Treatments / Results  Labs (all labs ordered are listed, but only abnormal results are displayed) Labs Reviewed  COMPREHENSIVE METABOLIC PANEL - Abnormal; Notable for the following components:      Result Value   Sodium 134 (*)    CO2 16 (*)    Glucose, Bld 147 (*)    BUN 39 (*)    Creatinine, Ser 1.86 (*)    Calcium 8.4 (*)    Albumin 3.0 (*)    GFR calc non Af Amer 24 (*)    GFR calc Af Amer 28 (*)    All other components within normal limits  CBC WITH DIFFERENTIAL/PLATELET - Abnormal; Notable for the  following components:   WBC 15.0 (*)    Hemoglobin 10.2 (*)    HCT 32.5 (*)    RDW 19.9 (*)    Neutro Abs 11.7 (*)    Monocytes Absolute 1.6 (*)    Abs Immature Granulocytes 0.10 (*)    All other components within normal limits  LACTIC ACID, PLASMA - Abnormal; Notable for the following components:   Lactic Acid, Venous 2.5 (*)    All other components within normal limits  BRAIN NATRIURETIC PEPTIDE - Abnormal; Notable for the following components:   B Natriuretic Peptide 348.8 (*)    All other components within normal limits  PROTIME-INR - Abnormal; Notable for the following components:   Prothrombin Time 22.3 (*)    INR 2.0 (*)    All other components within normal limits  APTT - Abnormal; Notable for the following components:   aPTT 49 (*)    All other components within normal limits  SARS CORONAVIRUS 2 (HOSPITAL ORDER, [place] [facility])  CULTURE, BLOOD (ROUTINE X 2)  CULTURE, BLOOD (ROUTINE X 2)  URINALYSIS, ROUTINE W REFLEX MICROSCOPIC  LACTIC ACID, PLASMA  LACTIC ACID, PLASMA  LACTIC ACID, PLASMA  TROPONIN I (HIGH SENSITIVITY)  TROPONIN I (HIGH SENSITIVITY)    EKG EKG Interpretation  Date/Time:  Thursday February 03 2019 12:08:46 EDT Ventricular Rate:  84 PR Interval:    QRS Duration: 83 QT Interval:  370 QTC Calculation: 438 R Axis:   68 Text Interpretation:  Sinus rhythm Atrial premature complex Borderline prolonged PR interval No STEMI  Confirmed by Octaviano Glow [phone]) on 02/03/2019 12:31:52 PM   Radiology Dg Chest Portable 1 View  Result Date: 02/03/2019 CLINICAL DATA:  Cough and shortness of breath EXAM: PORTABLE CHEST 1 VIEW COMPARISON:  07/17/2018 FINDINGS: Cardiac shadow is  stable. Aortic calcifications are again seen. Hiatal hernia is again noted. Patchy infiltrate is noted primarily within the right mid lung but extending into the right lung base. The left lung is clear. No bony abnormality is seen. IMPRESSION: Patchy infiltrate on  the left primarily in the mid lung laterally. Electronically Signed   By: Inez Catalina M.D.   On: 02/03/2019 13:32    Procedures .Critical Care Performed by: Wyvonnia Dusky, MD Authorized by: Wyvonnia Dusky, MD   Critical care provider statement:    Critical care time (minutes):  30   Critical care was necessary to treat or prevent imminent or life-threatening deterioration of the following conditions:  Sepsis   Critical care was time spent personally by me on the following activities:  Discussions with consultants, evaluation of patient's response to treatment, examination of patient, ordering and performing treatments and interventions, ordering and review of laboratory studies, ordering and review of radiographic studies, pulse oximetry, re-evaluation of patient's condition, obtaining history from patient or surrogate and review of old charts   (including critical care time)  Medications Ordered in ED Medications  cefTRIAXone (ROCEPHIN) 2 g in sodium chloride 0.9 % 100 mL IVPB (0 g Intravenous Stopped 02/03/19 1431)  azithromycin (ZITHROMAX) 500 mg in sodium chloride 0.9 % 250 mL IVPB (500 mg Intravenous New Bag/Given 02/03/19 1420)  apixaban (ELIQUIS) tablet 2.5 mg (has no administration in time range)  hydrALAZINE (APRESOLINE) tablet 25 mg (has no administration in time range)  atorvastatin (LIPITOR) tablet 20 mg (has no administration in time range)  traMADol (ULTRAM) tablet 50 mg (has no administration in time range)  pramipexole (MIRAPEX) tablet 0.125 mg (has no administration in time range)  pantoprazole (PROTONIX) EC tablet 40 mg (has no administration in time range)  sodium chloride 0.9 % bolus 1,000 mL (0 mLs Intravenous Stopped 02/03/19 1353)     Initial Impression / Assessment and Plan / ED Course  I have reviewed the triage vital signs and the nursing notes.  Pertinent labs & imaging results that were available during my care of the patient were reviewed by me and  considered in my medical decision making (see chart for details).  85 year old female presenting to the emergency department with 11 days of fevers and chills and myalgias and weakness.  Also reporting 3 days of productive cough and dyspnea on exertion.  Differential includes viral illness versus COVID-19 versus bacterial pneumonia versus UTI versus atypical ACS versus other.  Her pulse ox is 92% on room air.  Given that she is COPD this may be close to her baseline.  She has no wheezing on exam.  I do not believe this is COPD exacerbation.  She has no signs or symptoms of new onset heart failure at this time.  We will check a BNP  On her EKG she is in a sinus rhythm with a heart rate in the 80s.  Do not suspect that this is related to recurrent A. Fib.  On presentation for her vital signs she is not septic.  However she did take Tylenol prior to coming in.  The only sirs criteria she has an initial presentation is tachypnea.  Will order a rapid COVID test, check labs, check a UA, check a chest x-ray, give a liter of fluid and reassess.  She appears stable at this time.     Clinical Course as of Feb 02 1506  Thu Feb 03, 2019  1310 BUN(!): 39 [MT]  1310 Creatinine(!): 1.86 [MT]  1310 Lactic Acid, Venous(!!): 2.5 [MT]  1311 WBC(!): 15.0 [MT]  1313 Mild elevation in BNP, with normal echocardiogram performed June 2020 (EF 60-52F).  Some vascular congestion on Xray, without evidence of volume overload on my exam to suggest that her symptoms are entirely due to CHF.  My greater concern is a pulmonary infection.  This may be viral, but given her age and CURB 76 score, we will treat for sepsis PNA at this time.   [MT]  8295 Given the possibility of CHF however, I will be more judicious with fluids so as not to overload her with 30 cc/kg. For now we will do 1L IVF bolus ordered, plus IV antibiotics, and monitor her blood pressure.   [MT]  1336 SARS Coronavirus 2: NEGATIVE [MT]  6213 IMPRESSION:  Patchy infiltrate on the left primarily in the mid lung laterally.   [MT]  0865 Pt informed of diagnosis, sepsis PNA, will page hospitalist for [facility] [place]   [MT]  1412 Accepted by Dr. Posey Pronto at [facility].  I agree that telemetry bed would be appropriate level of care based on how the patient looks clinically right now, does not require stepdown or full ICU   [MT]    Clinical Course User Index [MT] Davinder Haff, Carola Rhine, MD    Final Clinical Impressions(s) / ED Diagnoses   Final diagnoses:  Sepsis, due to unspecified organism, unspecified whether acute organ dysfunction present [facility])  Community acquired pneumonia of left lung, unspecified part of lung    ED Discharge Orders    None       Tayley Mudrick, Carola Rhine, MD 02/03/19 1507

## 2019-02-04 DIAGNOSIS — R6520 Severe sepsis without septic shock: Secondary | ICD-10-CM

## 2019-02-04 DIAGNOSIS — A419 Sepsis, unspecified organism: Principal | ICD-10-CM

## 2019-02-04 LAB — BASIC METABOLIC PANEL
Anion gap: 13 (ref 5–15)
BUN: 27 mg/dL — ABNORMAL HIGH (ref 8–23)
CO2: 16 mmol/L — ABNORMAL LOW (ref 22–32)
Calcium: 8.1 mg/dL — ABNORMAL LOW (ref 8.9–10.3)
Chloride: 110 mmol/L (ref 98–111)
Creatinine, Ser: 1.43 mg/dL — ABNORMAL HIGH (ref 0.44–1.00)
GFR calc Af Amer: 39 mL/min — ABNORMAL LOW (ref >60)
GFR calc non Af Amer: 33 mL/min — ABNORMAL LOW (ref >60)
Glucose, Bld: 111 mg/dL — ABNORMAL HIGH (ref 70–99)
Potassium: 4.4 mmol/L (ref 3.5–5.1)
Sodium: 139 mmol/L (ref 135–145)

## 2019-02-04 LAB — BLOOD CULTURE ID PANEL (REFLEXED)

## 2019-02-04 LAB — CBC
HCT: 30.1 % — ABNORMAL LOW (ref 36.0–46.0)
Hemoglobin: 9.5 g/dL — ABNORMAL LOW (ref 12.0–15.0)
MCH: 26.7 pg (ref 26.0–34.0)
MCHC: 31.6 g/dL (ref 30.0–36.0)
MCV: 84.6 fL (ref 80.0–100.0)
Platelets: 232 10*3/uL (ref 150–400)
RBC: 3.56 MIL/uL — ABNORMAL LOW (ref 3.87–5.11)
RDW: 20.1 % — ABNORMAL HIGH (ref 11.5–15.5)
WBC: 13 10*3/uL — ABNORMAL HIGH (ref 4.0–10.5)
nRBC: 0 % (ref 0.0–0.2)

## 2019-02-04 LAB — RESPIRATORY PANEL BY PCR

## 2019-02-04 LAB — BRAIN NATRIURETIC PEPTIDE: B Natriuretic Peptide: 512 pg/mL — ABNORMAL HIGH (ref 0.0–100.0)

## 2019-02-04 MED ORDER — AZITHROMYCIN 250 MG PO TABS
500.0000 mg | ORAL_TABLET | Freq: Every day | ORAL | Status: DC
  Administered 2019-02-04 – 2019-02-07 (×4): 500 mg via ORAL
  Filled 2019-02-04 (×4): qty 2

## 2019-02-04 MED ORDER — SODIUM CHLORIDE 0.9 % IV SOLN
3.0000 g | Freq: Two times a day (BID) | INTRAVENOUS | Status: DC
  Administered 2019-02-04 – 2019-02-05 (×2): 3 g via INTRAVENOUS
  Filled 2019-02-04 (×2): qty 3

## 2019-02-04 NOTE — Evaluation (Signed)
Clinical/Bedside Swallow Evaluation Patient Details  Name: Judith Boone MRN: [id number] Date of Birth: Mar 31, 1932  Today's Date: 02/04/2019 Time: SLP Start Time (ACUTE ONLY): 1308 SLP Stop Time (ACUTE ONLY): 1648 SLP Time Calculation (min) (ACUTE ONLY): 59 min  Past Medical History:  Past Medical History:  Diagnosis Date  . Allergy    allergic rhinitis  . Arthritis   . Cellulitis of left leg 07-2014   hospitalized for 4 days  . COPD (chronic obstructive pulmonary disease) ([place])   . GERD (gastroesophageal reflux disease)   . History of granulomatous disease   . Hyperlipidemia   . Hypertension   . Left leg swelling   . Right-sided low back pain with sciatica    Past Surgical History:  Past Surgical History:  Procedure Laterality Date  . ABDOMINAL HYSTERECTOMY    . broken ankle repair     left  . CATARACT EXTRACTION    . TOTAL KNEE ARTHROPLASTY     right   HPI:  pt is an 85 yo female adm to [facility] with dyspnea, found to have left lobe pna.  Pt has h/o cervical web s/p diliatation, GERD, COPD, prior smoker - stopped 39 years ago, hiatal hernia.  Pt admits to severe reflux a few days after her fever was noted causing her to cough excessively.  She also states she will reflux to level of throat.  Pt denies significant dysphagia but does admit to occasionally sensing food/pills lodging in pharynx. She denies having to expectorate tablets.  Pt also reports severe problems with xerostomia and recent "mouth pain".     Assessment / Plan / Recommendation Clinical Impression  Patient able to easily pass [facility] Protocol without difficulties. She is observed to chronically clear her throar with and without intake and does report occasional sensation of food/pills lodging in pharynx.  No overt indications of aspiration. Given pt h/o multiple endoscopies,dilatations and cervical web as well as recent significant reflux episodes.  Suspect symptoms are related to esophageal deficits.  LPR  (laryngopharyngeal reflux) likely as pt scored 27/45 on Reflux Sympton Index which may contribute to symptoms as well.  SLP provided pt with information re: xerostomia, reviewed reflux/aspiration precautions and alternatives for medication administration.  Implored pt to inform MD of her recent significant reflux episodes - most recently at home in the middle of the night causing her to cough excessively for one hour.  Recommend continue diet as tolerated.   SLP Visit Diagnosis: Dysphagia, unspecified (R13.10)    Aspiration Risk  Mild aspiration risk    Diet Recommendation Regular;Thin liquid   Liquid Administration via: Cup;Straw Medication Administration: Whole meds with liquid(or with puree if problematic) Supervision: Patient able to self feed Compensations: Slow rate;Small sips/bites(start intake with liquids, consume small frequent meals) Postural Changes: Seated upright at 90 degrees;Remain upright for at least 30 minutes after po intake    Other  Recommendations   n/a  Follow up Recommendations   n/a  n/a   Frequency and Duration     n/a       Prognosis    n/a    Swallow Study   General HPI: pt is an 85 yo female adm to [facility] with dyspnea, found to have left lobe pna.  Pt has h/o cervical web s/p diliatation, GERD, COPD, prior smoker - stopped 39 years ago, hiatal hernia.  Pt admits to severe reflux a few days after her fever was noted causing her to cough excessively.  She also states she will  reflux to level of throat.  Pt denies significant dysphagia but does admit to occasionally sensing food/pills lodging in pharynx. She denies having to expectorate tablets.  Pt also reports severe problems with xerostomia and recent "mouth pain".   Type of Study: Bedside Swallow Evaluation Previous Swallow Assessment: esophagram 02/1999- web C5-C6 Diet Prior to this Study: Regular Temperature Spikes Noted: N/A Respiratory Status: Nasal cannula History of Recent Intubation:  No Behavior/Cognition: Alert Oral Cavity Assessment: Within Functional Limits Oral Care Completed by SLP: No Oral Cavity - Dentition: Adequate natural dentition Vision: Functional for self-feeding Self-Feeding Abilities: Able to feed self Patient Positioning: Upright in bed Baseline Vocal Quality: Normal Volitional Cough: Strong Volitional Swallow: Able to elicit    Oral/Motor/Sensory Function Overall Oral Motor/Sensory Function: Other (comment)(eye closure on right which pt states is involuntary)   Ice Chips Ice chips: Not tested   Thin Liquid Thin Liquid: Within functional limits    Nectar Thick Nectar Thick Liquid: Not tested   Honey Thick Honey Thick Liquid: Not tested   Puree Puree: Within functional limits   Solid     Solid: Within functional limits      Judith Boone 02/04/2019,5:01 PM  Luanna Salk, [facility] SLP Acute Rehab Services Pager [phone] Office [phone]

## 2019-02-04 NOTE — Progress Notes (Signed)
PHARMACIST - PHYSICIAN COMMUNICATION DR:   Earnest Conroy CONCERNING: Antibiotic IV to Oral Route Change Policy  RECOMMENDATION: This patient is receiving azithromycin by the intravenous route.  Based on criteria approved by the Pharmacy and Therapeutics Committee, the antibiotic(s) is/are being converted to the equivalent oral dose form(s).   DESCRIPTION: These criteria include:  Patient being treated for a respiratory tract infection, urinary tract infection, cellulitis or clostridium difficile associated diarrhea if on metronidazole  The patient is not neutropenic and does not exhibit a GI malabsorption state  The patient is eating (either orally or via tube) and/or has been taking other orally administered medications for a least 24 hours  The patient is improving clinically and has a Tmax < 100.5  If you have questions about this conversion, please contact the Pharmacy Department  []   [phone] )  [facility] []   [phone] )  [facility] []   [phone] )  [facility] []   [phone] )  [facility] [x]   [phone] )  [facility]

## 2019-02-04 NOTE — Progress Notes (Signed)
Assumed care of pt at 1500. Condition stable. No changes in initial am assessment at this time. Cont with plan of care

## 2019-02-04 NOTE — Progress Notes (Signed)
PHARMACY - PHYSICIAN COMMUNICATION CRITICAL VALUE ALERT - BLOOD CULTURE IDENTIFICATION (BCID)  Judith Boone is an 85 y.o. female who presented to [facility] on 02/03/2019 with a chief complaint of dyspnea, fever.  Assessment:  Currently on antibiotic for PNA on ampicillin/sulbactam for aspiration PNA.  BCID + 1/4 GPC staph species, no methicillin resistance detected. Pathogen vs. Contaminant?   Name of physician (or Provider) ContactedLorra Hals  Current antibiotics: Ampicillin/sulbactam + azithromycin  Changes to prescribed antibiotics: Discussed with provider. No changes to be made at this time, possible contaminant but ampicillin/sulbactam should cover. Will increase dose to ampicillin/sulbactam 3 g IV q12h.  Results for orders placed or performed during the hospital encounter of 02/03/19  Blood Culture ID Panel (Reflexed) (Collected: 02/03/2019 12:00 PM)  Result Value Ref Range   Enterococcus species NOT DETECTED NOT DETECTED   Listeria monocytogenes NOT DETECTED NOT DETECTED   Staphylococcus species DETECTED (A) NOT DETECTED   Staphylococcus aureus (BCID) NOT DETECTED NOT DETECTED   Methicillin resistance NOT DETECTED NOT DETECTED   Streptococcus species NOT DETECTED NOT DETECTED   Streptococcus agalactiae NOT DETECTED NOT DETECTED   Streptococcus pneumoniae NOT DETECTED NOT DETECTED   Streptococcus pyogenes NOT DETECTED NOT DETECTED   Acinetobacter baumannii NOT DETECTED NOT DETECTED   Enterobacteriaceae species NOT DETECTED NOT DETECTED   Enterobacter cloacae complex NOT DETECTED NOT DETECTED   Escherichia coli NOT DETECTED NOT DETECTED   Klebsiella oxytoca NOT DETECTED NOT DETECTED   Klebsiella pneumoniae NOT DETECTED NOT DETECTED   Proteus species NOT DETECTED NOT DETECTED   Serratia marcescens NOT DETECTED NOT DETECTED   Haemophilus influenzae NOT DETECTED NOT DETECTED   Neisseria meningitidis NOT DETECTED NOT DETECTED   Pseudomonas aeruginosa NOT DETECTED NOT DETECTED    Candida albicans NOT DETECTED NOT DETECTED   Candida glabrata NOT DETECTED NOT DETECTED   Candida krusei NOT DETECTED NOT DETECTED   Candida parapsilosis NOT DETECTED NOT DETECTED   Candida tropicalis NOT DETECTED NOT DETECTED    Lenis Noon, PharmD 02/04/2019  9:22 PM

## 2019-02-04 NOTE — Progress Notes (Addendum)
PROGRESS NOTE    Judith Boone  YIR:[id number]  DOB: 03-18-1932  DOA: 02/03/2019 PCP: Velna Hatchet, MD  Brief Narrative:   85 y.o.femalewith a history of paroxysmal A. fib, on metoprolol and Eliquis, COPD (not on home oxygen), hypertension, high cholesterol, presenting to the [facility] with c/o intermittent fever (Tmax 101.8) , myalgias for 10 days , dry cough x 3 days and shortness of breath since this morning unrelieved by home neb Rx. W/U in [facility] ED: Her pulse ox was 92% on room air on, initial presentation with tachypnea, WBC 15K and lactate level at 2.5.BUN 39, creat 1.86. Her BNP was elevated , hence received judicious IV fluids-I lit at outside ED. COVID -ve. CXR revealed Patchy infiltrate on the right base/ mid lung.  Patient admitted with empiric antibiotics including Unasyn/azithromycin to cover aspiration and community-acquired pneumonia  Subjective:   Patient feels better today with improvement in cough.  T-max 99.3 overnight  Objective: Vitals:   02/04/19 0655 02/04/19 0836 02/04/19 0838 02/04/19 1307  BP: (!) 137/92   (!) 149/62  Pulse: 91   91  Resp: 20   16  Temp: 99.3 F (37.4 C)   98.6 F (37 C)  TempSrc: Oral   Oral  SpO2: 94% 94% 94% 96%  Weight:      Height:        Intake/Output Summary (Last 24 hours) at 02/04/2019 1629 Last data filed at 02/04/2019 1500 Gross per 24 hour  Intake 1796.35 ml  Output 1900 ml  Net -103.65 ml   Filed Weights   02/03/19 1236 02/03/19 1747  Weight: 64.9 kg 68.3 kg    Physical Examination:  General exam: Appears calm and comfortable  Respiratory system: Clear to auscultation. Respiratory effort normal. Cardiovascular system: Decreased breath sounds at bases left greater than right, S1 & S2 heard, RRR. No JVD, murmurs, rubs, gallops or clicks. No pedal edema. Gastrointestinal system: Abdomen is nondistended, soft and nontender. No organomegaly or masses felt. Normal bowel sounds heard. Central nervous system: Alert and  oriented. No focal neurological deficits. Extremities: Symmetric 5 x 5 power. Skin: No rashes, lesions or ulcers Psychiatry: Judgement and insight appear normal. Mood & affect appropriate.     Data Reviewed: I have personally reviewed following labs and imaging studies  CBC: Recent Labs  Lab 02/03/19 1223 02/04/19 0341  WBC 15.0* 13.0*  NEUTROABS 11.7*  --   HGB 10.2* 9.5*  HCT 32.5* 30.1*  MCV 84.0 84.6  PLT 253 500   Basic Metabolic Panel: Recent Labs  Lab 02/03/19 1223 02/04/19 0341  NA 134* 139  K 4.1 4.4  CL 106 110  CO2 16* 16*  GLUCOSE 147* 111*  BUN 39* 27*  CREATININE 1.86* 1.43*  CALCIUM 8.4* 8.1*   GFR: Estimated Creatinine Clearance: 24.8 mL/min (A) (by C-G formula based on SCr of 1.43 mg/dL (H)). Liver Function Tests: Recent Labs  Lab 02/03/19 1223  AST 26  ALT 21  ALKPHOS 85  BILITOT 0.5  PROT 6.7  ALBUMIN 3.0*   No results for input(s): LIPASE, AMYLASE in the last 168 hours. No results for input(s): AMMONIA in the last 168 hours. Coagulation Profile: Recent Labs  Lab 02/03/19 1223  INR 2.0*   Cardiac Enzymes: No results for input(s): CKTOTAL, CKMB, CKMBINDEX, TROPONINI in the last 168 hours. BNP (last 3 results) No results for input(s): PROBNP in the last 8760 hours. HbA1C: No results for input(s): HGBA1C in the last 72 hours. CBG: No results for input(s): GLUCAP  in the last 168 hours. Lipid Profile: No results for input(s): CHOL, HDL, LDLCALC, TRIG, CHOLHDL, LDLDIRECT in the last 72 hours. Thyroid Function Tests: No results for input(s): TSH, T4TOTAL, FREET4, T3FREE, THYROIDAB in the last 72 hours. Anemia Panel: No results for input(s): VITAMINB12, FOLATE, FERRITIN, TIBC, IRON, RETICCTPCT in the last 72 hours. Sepsis Labs: Recent Labs  Lab 02/03/19 1223 02/03/19 1511  LATICACIDVEN 2.5* 0.9    Recent Results (from the past 240 hour(s))  Blood Culture (routine x 2)     Status: None (Preliminary result)   Collection Time:  02/03/19 12:00 PM   Specimen: BLOOD  Result Value Ref Range Status   Specimen Description   Final    BLOOD BLOOD LEFT FOREARM Performed at [facility], [address] [place], [place] [address]    Special Requests   Final    BOTTLES DRAWN AEROBIC AND ANAEROBIC Blood Culture adequate volume Performed at [facility], [address] [place], [place] [address]    Culture   Final    NO GROWTH < 24 HOURS Performed at [facility], [place] [address] [place], [place] [address]    Report Status PENDING  Incomplete  Blood Culture (routine x 2)     Status: None (Preliminary result)   Collection Time: 02/03/19 12:10 PM   Specimen: BLOOD  Result Value Ref Range Status   Specimen Description   Final    BLOOD BLOOD RIGHT FOREARM Performed at [facility], [address] [place], [place] [address]    Special Requests   Final    BOTTLES DRAWN AEROBIC AND ANAEROBIC Blood Culture adequate volume Performed at [facility], [address] [place], [place] [address]    Culture   Final    NO GROWTH < 24 HOURS Performed at [facility], [place] [address], [place], [place] [address]    Report Status PENDING  Incomplete  SARS Coronavirus 2 [facility] order, Performed in [facility] hospital lab) Nasopharyngeal Nasopharyngeal Swab     Status: None   Collection Time: 02/03/19 12:23 PM   Specimen: Nasopharyngeal Swab  Result Value Ref Range Status   SARS Coronavirus 2 NEGATIVE NEGATIVE Final    Comment: (NOTE) If result is NEGATIVE SARS-CoV-2 target nucleic acids are NOT DETECTED. The SARS-CoV-2 RNA is generally detectable in upper and lower  respiratory specimens during the acute phase of infection. The lowest  concentration of SARS-CoV-2 viral copies this assay can detect is 250  copies / mL. A negative result does not preclude SARS-CoV-2 infection  and should not be used as the sole basis for treatment or other  patient management decisions.   A negative result may occur with  improper specimen collection / handling, submission of specimen other  than nasopharyngeal swab, presence of viral mutation(s) within the  areas targeted by this assay, and inadequate number of viral copies  (<250 copies / mL). A negative result must be combined with clinical  observations, patient history, and epidemiological information. If result is POSITIVE SARS-CoV-2 target nucleic acids are DETECTED. The SARS-CoV-2 RNA is generally detectable in upper and lower  respiratory specimens dur ing the acute phase of infection.  Positive  results are indicative of active infection with SARS-CoV-2.  Clinical  correlation with patient history and other diagnostic information is  necessary to determine patient infection status.  Positive results do  not rule out bacterial infection or co-infection with other viruses.  If result is PRESUMPTIVE POSTIVE SARS-CoV-2 nucleic acids MAY BE PRESENT.   A presumptive positive result was obtained on the submitted specimen  and confirmed on repeat testing.  While 2019 novel coronavirus  (SARS-CoV-2) nucleic acids may be present in the submitted sample  additional confirmatory testing may be necessary for epidemiological  and / or clinical management purposes  to differentiate between  SARS-CoV-2 and other Sarbecovirus currently known to infect humans.  If clinically indicated additional testing with an alternate test  methodology [phone]) is advised. The SARS-CoV-2 RNA is generally  detectable in upper and lower respiratory sp ecimens during the acute  phase of infection. The expected result is Negative. Fact Sheet for Patients:  StrictlyIdeas.no Fact Sheet for Healthcare Providers: BankingDealers.co.za This test is not yet approved or cleared by the [place] FDA and has been authorized for detection and/or diagnosis of SARS-CoV-2 by FDA under an Emergency Use  Authorization (EUA).  This EUA will remain in effect (meaning this test can be used) for the duration of the COVID-19 declaration under Section 564(b)(1) of the Act, 21 U.S.C. section 360bbb-3(b)(1), unless the authorization is terminated or revoked sooner. Performed at [facility], [address] [place], [place] [address]   Respiratory Panel by PCR     Status: None   Collection Time: 02/04/19  8:40 AM   Specimen: Nasopharyngeal Swab; Respiratory  Result Value Ref Range Status   Adenovirus NOT DETECTED NOT DETECTED Final   Coronavirus 229E NOT DETECTED NOT DETECTED Final    Comment: (NOTE) The Coronavirus on the Respiratory Panel, DOES NOT test for the novel  Coronavirus (2019 nCoV)    Coronavirus HKU1 NOT DETECTED NOT DETECTED Final   Coronavirus NL63 NOT DETECTED NOT DETECTED Final   Coronavirus OC43 NOT DETECTED NOT DETECTED Final   Metapneumovirus NOT DETECTED NOT DETECTED Final   Rhinovirus / Enterovirus NOT DETECTED NOT DETECTED Final   Influenza A NOT DETECTED NOT DETECTED Final   Influenza B NOT DETECTED NOT DETECTED Final   Parainfluenza Virus 1 NOT DETECTED NOT DETECTED Final   Parainfluenza Virus 2 NOT DETECTED NOT DETECTED Final   Parainfluenza Virus 3 NOT DETECTED NOT DETECTED Final   Parainfluenza Virus 4 NOT DETECTED NOT DETECTED Final   Respiratory Syncytial Virus NOT DETECTED NOT DETECTED Final   Bordetella pertussis NOT DETECTED NOT DETECTED Final   Chlamydophila pneumoniae NOT DETECTED NOT DETECTED Final   Mycoplasma pneumoniae NOT DETECTED NOT DETECTED Final    Comment: Performed at [facility], [place]. [address] [place], [place] [address]      Radiology Studies: Dg Chest Portable 1 View  Result Date: 02/03/2019 CLINICAL DATA:  Cough and shortness of breath EXAM: PORTABLE CHEST 1 VIEW COMPARISON:  07/17/2018 FINDINGS: Cardiac shadow is stable. Aortic calcifications are again seen. Hiatal hernia is again noted. Patchy infiltrate is noted  primarily within the right mid lung but extending into the right lung base. The left lung is clear. No bony abnormality is seen. IMPRESSION: Patchy infiltrate on the left primarily in the mid lung laterally. Electronically Signed   By: Inez Catalina M.D.   On: 02/03/2019 13:32        Scheduled Meds: . amLODipine  2.5 mg Oral Daily  . apixaban  2.5 mg Oral BID  . atorvastatin  20 mg Oral q1800  . azithromycin  500 mg Oral Daily  . loratadine  10 mg Oral Daily  . metoprolol succinate  25 mg Oral QHS  .  pantoprazole  40 mg Oral Daily  . pramipexole  0.125 mg Oral QHS  . umeclidinium-vilanterol  1 puff Inhalation Daily   Continuous Infusions: . sodium chloride 60 mL/hr at 02/04/19 1404  . ampicillin-sulbactam (UNASYN) IV 1.5 g (02/04/19 1026)    Assessment & Plan:    1.  Right-sided pneumonia/sepsis syndrome/acute hypoxic respiratory failure: Continue IV Unasyn and p.o. Zithromax.  Speech therapy evaluation for swallowing pending.  Cough improving.  Respiratory panel negative for viral etiology.  COVID screen negative.  Urine streptococcal/Legionella antigens pending.  Can come off droplet precautions.  Leukocytosis resolved.  Appears to be defervescing well.  Chest negative so far.  If continues to do well, can transition to oral antibiotics soon and complete course as outpatient.  Taper O2 to off as tolerated, nebs PRN  2.  AKI on CKD stage III: Improved with IV fluids.  Baseline creatinine appears to be around 1.4.  DC IV fluids and encourage oral fluid intake.  Holding ACE inhibitors  3.  Paroxysmal atrial fibrillation: Resumed metoprolol as blood pressure better now.  Resume Eliquis.  4.  Hypertension: Patient had soft blood pressures on arrival which are now improved after hydration .  Currently blood pressure elevated at systolic 021J to 155M.  Resumed metoprolol.  Increase Norvasc to home dose.  Holding scheduled hydralazine but will have PRN available.  Hold ACE inhibitors in  concern for problem #2  5.  Hyperlipidemia: Resume home medications  6.  COPD: Resume home inhaler therapy.  Albuterol neb treatments PRN available.  Currently requiring 2 L nasal cannula in the setting of problem #1, last echo did show elevated pulmonary pressures but patient not on home O2.  Taper as tolerated.    7.  GERD: Patient on PPI as well as Pepcid as outpatient  8. Chronic arthritis pain: Resume tramadol.  9.  Diastolic cardiomyopathy: Patient has elevated BNP in the setting of renal insufficiency.  Given fever and white count favor infectious etiology for CXR findings that CHF. No clinical signs of volume overload.  Since blood pressure improved, DC IV fluids to avoid pulmonary edema       DVT prophylaxis: On anticoagulation Code Status: Full code Family / Patient Communication: Discussed with patient Disposition Plan: Home in next 24 to 48 hours     LOS: 1 day    Time spent: 35 minutes    Guilford Shi, MD [facility] Pager [phone]  If 7PM-7AM, please contact night-coverage [email or web address] Password [email or web address] 02/04/2019, 4:29 PM

## 2019-02-05 LAB — CBC
HCT: 32.2 % — ABNORMAL LOW (ref 36.0–46.0)
Hemoglobin: 10.3 g/dL — ABNORMAL LOW (ref 12.0–15.0)
MCH: 26.8 pg (ref 26.0–34.0)
MCHC: 32 g/dL (ref 30.0–36.0)
MCV: 83.6 fL (ref 80.0–100.0)
Platelets: 267 10*3/uL (ref 150–400)
RBC: 3.85 MIL/uL — ABNORMAL LOW (ref 3.87–5.11)
RDW: 20.2 % — ABNORMAL HIGH (ref 11.5–15.5)
WBC: 13 10*3/uL — ABNORMAL HIGH (ref 4.0–10.5)
nRBC: 0 % (ref 0.0–0.2)

## 2019-02-05 LAB — CULTURE, BLOOD (ROUTINE X 2): Special Requests: ADEQUATE

## 2019-02-05 LAB — BASIC METABOLIC PANEL
Anion gap: 13 (ref 5–15)
BUN: 21 mg/dL (ref 8–23)
CO2: 17 mmol/L — ABNORMAL LOW (ref 22–32)
Calcium: 8.3 mg/dL — ABNORMAL LOW (ref 8.9–10.3)
Chloride: 109 mmol/L (ref 98–111)
Creatinine, Ser: 1.14 mg/dL — ABNORMAL HIGH (ref 0.44–1.00)
GFR calc Af Amer: 51 mL/min — ABNORMAL LOW (ref >60)
GFR calc non Af Amer: 44 mL/min — ABNORMAL LOW (ref >60)
Glucose, Bld: 98 mg/dL (ref 70–99)
Potassium: 4 mmol/L (ref 3.5–5.1)
Sodium: 139 mmol/L (ref 135–145)

## 2019-02-05 MED ORDER — METOPROLOL SUCCINATE ER 50 MG PO TB24
50.0000 mg | ORAL_TABLET | Freq: Every day | ORAL | Status: DC
  Administered 2019-02-05 – 2019-02-07 (×3): 50 mg via ORAL
  Filled 2019-02-05 (×3): qty 1

## 2019-02-05 MED ORDER — PANTOPRAZOLE SODIUM 40 MG PO TBEC
40.0000 mg | DELAYED_RELEASE_TABLET | Freq: Every day | ORAL | Status: DC
  Administered 2019-02-06: 40 mg via ORAL
  Filled 2019-02-05: qty 1

## 2019-02-05 MED ORDER — SODIUM CHLORIDE 0.9 % IV SOLN
3.0000 g | Freq: Three times a day (TID) | INTRAVENOUS | Status: DC
  Administered 2019-02-05 – 2019-02-07 (×6): 3 g via INTRAVENOUS
  Filled 2019-02-05 (×4): qty 3
  Filled 2019-02-05: qty 8
  Filled 2019-02-05 (×2): qty 3

## 2019-02-05 NOTE — Progress Notes (Signed)
Pharmacy: Abx renal dose adjustment SCr down to 1.14. CrCl 31.64ml/min  Plan: increase Unasyn to 3 gm IV q8h for CrCl > 30  Eudelia Bunch, Pharm.D 02/05/2019 10:37 AM

## 2019-02-05 NOTE — Progress Notes (Signed)
Patient now in A-fib rate control, does have hx of A-fib, Dr. Earnest Conroy notified. Will continue to assess patient.

## 2019-02-05 NOTE — Progress Notes (Signed)
PROGRESS NOTE    Judith Boone  GNF:[id number]  DOB: [birth year]  DOA: 02/03/2019 PCP: Velna Hatchet, MD  Brief Narrative:   85 y.o.femalewith a history of paroxysmal A. fib, on metoprolol and Eliquis, COPD (not on home oxygen), hypertension, high cholesterol, presenting to the [facility] with c/o intermittent fever (Tmax 101.8) , myalgias for 10 days , dry cough x 3 days and shortness of breath since this morning unrelieved by home neb Rx. W/U in [facility] ED: Her pulse ox was 92% on room air on, initial presentation with tachypnea, WBC 15K and lactate level at 2.5.BUN 39, creat 1.86. Her BNP was elevated , hence received judicious IV fluids-I lit at outside ED. COVID -ve. CXR revealed Patchy infiltrate on the right base/ mid lung.  Patient admitted with empiric antibiotics including Unasyn/azithromycin to cover aspiration and community-acquired pneumonia  Subjective:   Patient feels better today with improvement in cough.  T-max 99.3 overnight  Objective: Vitals:   02/05/19 0434 02/05/19 0820 02/05/19 0950 02/05/19 1346  BP: (!) 150/64  130/63 123/71  Pulse: 92   (!) 105  Resp: 18   20  Temp: 98.1 F (36.7 C)   98 F (36.7 C)  TempSrc:    Oral  SpO2: (!) 89% 94%  93%  Weight:      Height:        Intake/Output Summary (Last 24 hours) at 02/05/2019 1552 Last data filed at 02/05/2019 1041 Gross per 24 hour  Intake 474 ml  Output 500 ml  Net -26 ml   Filed Weights   02/03/19 1236 02/03/19 1747  Weight: 64.9 kg 68.3 kg    Physical Examination:  General exam: Appears calm and comfortable  Respiratory system: Clear to auscultation. Respiratory effort normal. Cardiovascular system: Decreased breath sounds at bases left greater than right, S1 & S2 heard, RRR. No JVD, murmurs, rubs, gallops or clicks. No pedal edema. Gastrointestinal system: Abdomen is nondistended, soft and nontender. No organomegaly or masses felt. Normal bowel sounds heard. Central nervous system: Alert and  oriented. No focal neurological deficits. Extremities: Symmetric 5 x 5 power. Skin: No rashes, lesions or ulcers Psychiatry: Judgement and insight appear normal. Mood & affect appropriate.     Data Reviewed: I have personally reviewed following labs and imaging studies  CBC: Recent Labs  Lab 02/03/19 1223 02/04/19 0341 02/05/19 0939  WBC 15.0* 13.0* 13.0*  NEUTROABS 11.7*  --   --   HGB 10.2* 9.5* 10.3*  HCT 32.5* 30.1* 32.2*  MCV 84.0 84.6 83.6  PLT 253 232 846   Basic Metabolic Panel: Recent Labs  Lab 02/03/19 1223 02/04/19 0341 02/05/19 0440  NA 134* 139 139  K 4.1 4.4 4.0  CL 106 110 109  CO2 16* 16* 17*  GLUCOSE 147* 111* 98  BUN 39* 27* 21  CREATININE 1.86* 1.43* 1.14*  CALCIUM 8.4* 8.1* 8.3*   GFR: Estimated Creatinine Clearance: 31.1 mL/min (A) (by C-G formula based on SCr of 1.14 mg/dL (H)). Liver Function Tests: Recent Labs  Lab 02/03/19 1223  AST 26  ALT 21  ALKPHOS 85  BILITOT 0.5  PROT 6.7  ALBUMIN 3.0*   No results for input(s): LIPASE, AMYLASE in the last 168 hours. No results for input(s): AMMONIA in the last 168 hours. Coagulation Profile: Recent Labs  Lab 02/03/19 1223  INR 2.0*   Cardiac Enzymes: No results for input(s): CKTOTAL, CKMB, CKMBINDEX, TROPONINI in the last 168 hours. BNP (last 3 results) No results for input(s): PROBNP in  the last 8760 hours. HbA1C: No results for input(s): HGBA1C in the last 72 hours. CBG: No results for input(s): GLUCAP in the last 168 hours. Lipid Profile: No results for input(s): CHOL, HDL, LDLCALC, TRIG, CHOLHDL, LDLDIRECT in the last 72 hours. Thyroid Function Tests: No results for input(s): TSH, T4TOTAL, FREET4, T3FREE, THYROIDAB in the last 72 hours. Anemia Panel: No results for input(s): VITAMINB12, FOLATE, FERRITIN, TIBC, IRON, RETICCTPCT in the last 72 hours. Sepsis Labs: Recent Labs  Lab 02/03/19 1223 02/03/19 1511  LATICACIDVEN 2.5* 0.9    Recent Results (from the past 240  hour(s))  Blood Culture (routine x 2)     Status: Abnormal   Collection Time: 02/03/19 12:00 PM   Specimen: BLOOD LEFT FOREARM  Result Value Ref Range Status   Specimen Description   Final    BLOOD LEFT FOREARM Performed at [facility], [place] [address] [place], [place] [address]    Special Requests   Final    BOTTLES DRAWN AEROBIC AND ANAEROBIC Blood Culture adequate volume Performed at [facility], [address] [place], [place] [address]    Culture  Setup Time   Final    AEROBIC BOTTLE ONLY GRAM POSITIVE COCCI CRITICAL RESULT CALLED TO, READ BACK BY AND VERIFIED WITH: J GADHIA [facility] 02/03/2025 JDW    Culture (A)  Final    STAPHYLOCOCCUS SPECIES (COAGULASE NEGATIVE) THE SIGNIFICANCE OF ISOLATING THIS ORGANISM FROM A SINGLE SET OF BLOOD CULTURES WHEN MULTIPLE SETS ARE DRAWN IS UNCERTAIN. PLEASE NOTIFY THE MICROBIOLOGY DEPARTMENT WITHIN ONE WEEK IF SPECIATION AND SENSITIVITIES ARE REQUIRED. Performed at [facility], [place] [address], [place], [place] [address]    Report Status 02/05/2019 FINAL  Final  Blood Culture ID Panel (Reflexed)     Status: Abnormal   Collection Time: 02/03/19 12:00 PM  Result Value Ref Range Status   Enterococcus species NOT DETECTED NOT DETECTED Final   Listeria monocytogenes NOT DETECTED NOT DETECTED Final   Staphylococcus species DETECTED (A) NOT DETECTED Final    Comment: Methicillin (oxacillin) susceptible coagulase negative staphylococcus. Possible blood culture contaminant (unless isolated from more than one blood culture draw or clinical case suggests pathogenicity). No antibiotic treatment is indicated for blood  culture contaminants. CRITICAL RESULT CALLED TO, READ BACK BY AND VERIFIED WITH: Melodye Ped [facility] 02/04/19 2026 JDW    Staphylococcus aureus (BCID) NOT DETECTED NOT DETECTED Final   Methicillin resistance NOT DETECTED NOT DETECTED Final   Streptococcus species NOT DETECTED NOT DETECTED Final   Streptococcus  agalactiae NOT DETECTED NOT DETECTED Final   Streptococcus pneumoniae NOT DETECTED NOT DETECTED Final   Streptococcus pyogenes NOT DETECTED NOT DETECTED Final   Acinetobacter baumannii NOT DETECTED NOT DETECTED Final   Enterobacteriaceae species NOT DETECTED NOT DETECTED Final   Enterobacter cloacae complex NOT DETECTED NOT DETECTED Final   Escherichia coli NOT DETECTED NOT DETECTED Final   Klebsiella oxytoca NOT DETECTED NOT DETECTED Final   Klebsiella pneumoniae NOT DETECTED NOT DETECTED Final   Proteus species NOT DETECTED NOT DETECTED Final   Serratia marcescens NOT DETECTED NOT DETECTED Final   Haemophilus influenzae NOT DETECTED NOT DETECTED Final   Neisseria meningitidis NOT DETECTED NOT DETECTED Final   Pseudomonas aeruginosa NOT DETECTED NOT DETECTED Final   Candida albicans NOT DETECTED NOT DETECTED Final   Candida glabrata NOT DETECTED NOT DETECTED Final   Candida krusei NOT DETECTED NOT DETECTED Final   Candida parapsilosis NOT DETECTED NOT DETECTED Final   Candida tropicalis  NOT DETECTED NOT DETECTED Final    Comment: Performed at [facility], [place] [address], [place], [place] [address]  Blood Culture (routine x 2)     Status: None (Preliminary result)   Collection Time: 02/03/19 12:10 PM   Specimen: BLOOD RIGHT FOREARM  Result Value Ref Range Status   Specimen Description   Final    BLOOD RIGHT FOREARM Performed at [facility], [place] [address] [place], [place] [address]    Special Requests   Final    BOTTLES DRAWN AEROBIC AND ANAEROBIC Blood Culture adequate volume Performed at [facility], [address] [place], [place] [address]    Culture   Final    NO GROWTH 2 DAYS Performed at [facility], [place] [address], [place], [place] [address]    Report Status PENDING  Incomplete  SARS Coronavirus 2 [facility] order, Performed in [facility] hospital lab) Nasopharyngeal Nasopharyngeal Swab     Status: None   Collection Time: 02/03/19 12:23  PM   Specimen: Nasopharyngeal Swab  Result Value Ref Range Status   SARS Coronavirus 2 NEGATIVE NEGATIVE Final    Comment: (NOTE) If result is NEGATIVE SARS-CoV-2 target nucleic acids are NOT DETECTED. The SARS-CoV-2 RNA is generally detectable in upper and lower  respiratory specimens during the acute phase of infection. The lowest  concentration of SARS-CoV-2 viral copies this assay can detect is 250  copies / mL. A negative result does not preclude SARS-CoV-2 infection  and should not be used as the sole basis for treatment or other  patient management decisions.  A negative result may occur with  improper specimen collection / handling, submission of specimen other  than nasopharyngeal swab, presence of viral mutation(s) within the  areas targeted by this assay, and inadequate number of viral copies  (<250 copies / mL). A negative result must be combined with clinical  observations, patient history, and epidemiological information. If result is POSITIVE SARS-CoV-2 target nucleic acids are DETECTED. The SARS-CoV-2 RNA is generally detectable in upper and lower  respiratory specimens dur ing the acute phase of infection.  Positive  results are indicative of active infection with SARS-CoV-2.  Clinical  correlation with patient history and other diagnostic information is  necessary to determine patient infection status.  Positive results do  not rule out bacterial infection or co-infection with other viruses. If result is PRESUMPTIVE POSTIVE SARS-CoV-2 nucleic acids MAY BE PRESENT.   A presumptive positive result was obtained on the submitted specimen  and confirmed on repeat testing.  While 2019 novel coronavirus  (SARS-CoV-2) nucleic acids may be present in the submitted sample  additional confirmatory testing may be necessary for epidemiological  and / or clinical management purposes  to differentiate between  SARS-CoV-2 and other Sarbecovirus currently known to infect humans.    If clinically indicated additional testing with an alternate test  methodology [phone]) is advised. The SARS-CoV-2 RNA is generally  detectable in upper and lower respiratory sp ecimens during the acute  phase of infection. The expected result is Negative. Fact Sheet for Patients:  StrictlyIdeas.no Fact Sheet for Healthcare Providers: BankingDealers.co.za This test is not yet approved or cleared by the [place] FDA and has been authorized for detection and/or diagnosis of SARS-CoV-2 by FDA under an Emergency Use Authorization (EUA).  This EUA will remain in effect (meaning this test can be used) for the duration of the COVID-19 declaration under Section 564(b)(1) of the Act, 21 U.S.C.  section 360bbb-3(b)(1), unless the authorization is terminated or revoked sooner. Performed at [facility], [address] [place], [place] [address]   Respiratory Panel by PCR     Status: None   Collection Time: 02/04/19  8:40 AM   Specimen: Nasopharyngeal Swab; Respiratory  Result Value Ref Range Status   Adenovirus NOT DETECTED NOT DETECTED Final   Coronavirus 229E NOT DETECTED NOT DETECTED Final    Comment: (NOTE) The Coronavirus on the Respiratory Panel, DOES NOT test for the novel  Coronavirus (2019 nCoV)    Coronavirus HKU1 NOT DETECTED NOT DETECTED Final   Coronavirus NL63 NOT DETECTED NOT DETECTED Final   Coronavirus OC43 NOT DETECTED NOT DETECTED Final   Metapneumovirus NOT DETECTED NOT DETECTED Final   Rhinovirus / Enterovirus NOT DETECTED NOT DETECTED Final   Influenza A NOT DETECTED NOT DETECTED Final   Influenza B NOT DETECTED NOT DETECTED Final   Parainfluenza Virus 1 NOT DETECTED NOT DETECTED Final   Parainfluenza Virus 2 NOT DETECTED NOT DETECTED Final   Parainfluenza Virus 3 NOT DETECTED NOT DETECTED Final   Parainfluenza Virus 4 NOT DETECTED NOT DETECTED Final   Respiratory Syncytial Virus NOT DETECTED NOT  DETECTED Final   Bordetella pertussis NOT DETECTED NOT DETECTED Final   Chlamydophila pneumoniae NOT DETECTED NOT DETECTED Final   Mycoplasma pneumoniae NOT DETECTED NOT DETECTED Final    Comment: Performed at [facility], [place], [place], [place] [address]      Radiology Studies: No results found.      Scheduled Meds:  amLODipine  2.5 mg Oral Daily   apixaban  2.5 mg Oral BID   atorvastatin  20 mg Oral q1800   azithromycin  500 mg Oral Daily   loratadine  10 mg Oral Daily   metoprolol succinate  50 mg Oral Daily   pantoprazole  40 mg Oral Daily   pramipexole  0.125 mg Oral QHS   umeclidinium-vilanterol  1 puff Inhalation Daily   Continuous Infusions:  ampicillin-sulbactam (UNASYN) IV      Assessment & Plan:    1.  Right-sided pneumonia/sepsis syndrome/acute hypoxic respiratory failure: Continue IV Unasyn and p.o. Zithromax.  Speech therapy evaluation appreciated-patient felt to be mild aspiration risk from severe reflux disease.  Advised to change PPI to bedtime and also follow aspiration precautions with head elevation as well as swallowing techniques as advised by ST.  Cough improving.  Respiratory panel negative for viral etiology.  COVID screen negative.  Urine streptococcal/Legionella antigen ordered but not sent yet--likely redundant at this point.  Will discontinue lab request. Leukocytosis resolved.  Appears to be defervescing well.  Blood cultures growing coagulase-negative staph-likely contaminant.  If continues to do well, can transition to oral antibiotics soon and complete course as outpatient.  Tried tapering O2 to off today but patient was tachycardic and O2 sat dropped to 88% with minimum exertion.  Will continue antibiotics for an additional day and obtain PT eval in a.m-repeat desat studies  2.  AKI on CKD stage III: Improved with IV fluids.  Baseline creatinine appears to be around 1.4.  DC IV fluids and encourage oral fluid intake.   Holding ACE inhibitors  3.  Paroxysmal atrial fibrillation: Resumed metoprolol as blood pressure better now.  Increase dose given tachycardia and patient in A. fib today.   Continue Eliquis.  4.  Hypertension: Patient had soft blood pressures on arrival which are now improved after hydration.  Increased metoprolol dose today for problem #  3.  Continue Norvasc at 2.5 mg. Holding scheduled hydralazine but will have PRN available.  Holding ACE inhibitors in concern for problem #2  5.  Hyperlipidemia: Resume home medications  6.  COPD: Resume home inhaler therapy.  Albuterol neb treatments PRN available.  Currently requiring 1-2 L nasal cannula in the setting of problem #1, last echo did show elevated pulmonary pressures but patient not on home O2.  Taper as tolerated.    Repeat O2 desat studies in a.m.  7. Diastolic cardiomyopathy: Patient has elevated BNP in the setting of renal insufficiency.  Given fever and white count favor infectious etiology for CXR findings that CHF. No clinical signs of volume overload.  Since blood pressure improved, DCed IV fluids to avoid pulmonary edema  8. Chronic arthritis pain: Resume tramadol.  9.  Positive blood cultures: Blood cultures drawn on admission grew coagulase-negative staph in 1 out of 2 bottles.  Likely contamination.  Doubt true bacteremia.   Can consider repeat cultures if patient spikes fever.  Afebrile so far  10. GERD: Patient on PPI as well as Pepcid as outpatient.  Changed PPI to nightly.      DVT prophylaxis: On anticoagulation Code Status: Full code Family / Patient Communication: Discussed with patient Disposition Plan: Home in next 24 to 48 hours.  PT eval for discharge safety as patient lives alone.     LOS: 2 days    Time spent: 35 minutes    Guilford Shi, MD [facility] Pager [phone]  If 7PM-7AM, please contact night-coverage [email or web address] Password [email or web address] 02/05/2019, 3:52 PM

## 2019-02-06 DIAGNOSIS — R0902 Hypoxemia: Secondary | ICD-10-CM

## 2019-02-06 MED ORDER — SENNOSIDES-DOCUSATE SODIUM 8.6-50 MG PO TABS: 2.0000 | ORAL_TABLET | Freq: Every evening | ORAL | Status: DC | PRN

## 2019-02-06 MED ORDER — POLYETHYLENE GLYCOL 3350 17 G PO PACK: 17.0000 g | PACK | Freq: Every day | ORAL | Status: DC | PRN

## 2019-02-06 NOTE — Evaluation (Signed)
Occupational Therapy Evaluation Patient Details Name: Judith Boone MRN: [id number] DOB: [birth year] Today's Date: 02/06/2019    History of Present Illness 85 year old with history of paroxysmal A. fib on Eliquis, bil TKAs, arthritis, COPD, essential hypertension, hyperlipidemia, ankle surgery.   admitted with complains of fevers and myalgias, cough.  Chest x-ray showed patchy infiltrate of the right mid and lower lung base.  COVID-negative.   Clinical Impression   Pt admitted with above. She demonstrates the below listed deficits and will benefit from continued OT to maximize safety and independence with BADLs.  Pt presents to OT with generalized weakness, decreased activity tolerance, and mild balance deficits.  She is able to perform ADLs with min guard assist and performs functional mobility with min guard assist.  She lives alone, and was mod I PTA - used a RW at night and for long distance ambulation.  She reports her daughter will be staying with her at discharge.  Will follow acutely.       Follow Up Recommendations  Home health OT;Supervision/Assistance - 24 hour(initially )    Equipment Recommendations  None recommended by OT    Recommendations for Other Services       Precautions / Restrictions Precautions Precautions: Fall Restrictions Weight Bearing Restrictions: No      Mobility Bed Mobility               General bed mobility comments: pt sitting up in the chair   Transfers Overall transfer level: Needs assistance   Transfers: Sit to/from Stand;Stand Pivot Transfers Sit to Stand: Min guard Stand pivot transfers: Min guard       General transfer comment: min guard for safety     Balance Overall balance assessment: Needs assistance(denies falls)                                         ADL either performed or assessed with clinical judgement   ADL Overall ADL's : Needs assistance/impaired Eating/Feeding: Independent   Grooming:  Wash/dry hands;Wash/dry face;Oral care;Brushing hair;Min guard;Standing   Upper Body Bathing: Min guard;Standing   Lower Body Bathing: Min guard;Sit to/from stand Lower Body Bathing Details (indicate cue type and reason): performed simulated shower in standing with no LOB.  DOE 2/4  Upper Body Dressing : Set up;Sitting   Lower Body Dressing: Min guard;Sit to/from stand Lower Body Dressing Details (indicate cue type and reason): Pt able to don socks, but struggles.  She does have a sock aid at home  Toilet Transfer: Min guard;Ambulation;Regular Toilet;Grab bars;RW   Toileting- Water quality scientist and Hygiene: Min guard;Sit to/from stand       Functional mobility during ADLs: Min guard;Cueing for sequencing       Vision Baseline Vision/History: Wears glasses Wears Glasses: At all times Patient Visual Report: No change from baseline       Perception     Praxis      Pertinent Vitals/Pain Pain Assessment: No/denies pain     Hand Dominance Right   Extremity/Trunk Assessment Upper Extremity Assessment Upper Extremity Assessment: Defer to OT evaluation;Overall [facility] for tasks assessed   Lower Extremity Assessment Lower Extremity Assessment: Generalized weakness   Cervical / Trunk Assessment Cervical / Trunk Assessment: Kyphotic   Communication Communication Communication: No difficulties   Cognition Arousal/Alertness: Awake/alert Behavior During Therapy: WFL for tasks assessed/performed Overall Cognitive Status: Within Functional Limits for tasks assessed  General Comments  02 sats 92-95% on RA.  DOE 2/4    Exercises     Shoulder Instructions      Home Living Family/patient expects to be discharged to:: Private residence Living Arrangements: Alone Available Help at Discharge: Family;Available 24 hours/day Type of Home: House(townhouse) Home Access: Level entry     Home Layout: One level     Bathroom  Shower/Tub: Occupational psychologist: Handicapped height Bathroom Accessibility: Yes   Home Equipment: Environmental consultant - 2 wheels;Walker - 4 wheels Adaptive Equipment: Sock aid Additional Comments: daughter coming to stay with pt for 1-2 weeks      Prior Functioning/Environment Level of Independence: Independent;Independent with assistive device(s)        Comments: amb with rollator for longer distances        OT Problem List: Decreased activity tolerance;Impaired balance (sitting and/or standing);Cardiopulmonary status limiting activity      OT Treatment/Interventions: Self-care/ADL training;Therapeutic exercise;Energy conservation;DME and/or AE instruction;Therapeutic activities;Patient/family education;Balance training    OT Goals(Current goals can be found in the care plan section) Acute Rehab OT Goals Patient Stated Goal: To go home "it's boring home"  OT Goal Formulation: With patient/family Time For Goal Achievement: 04/22/19 Potential to Achieve Goals: Good ADL Goals Pt Will Perform Grooming: with modified independence;standing Pt Will Perform Upper Body Bathing: with modified independence;standing Pt Will Perform Lower Body Bathing: with modified independence;sit to/from stand Pt Will Perform Lower Body Dressing: with modified independence;with adaptive equipment;sit to/from stand Pt Will Transfer to Toilet: with modified independence;ambulating;regular height toilet;grab bars Pt Will Perform Toileting - Clothing Manipulation and hygiene: with modified independence;sit to/from stand Pt Will Perform Tub/Shower Transfer: Shower transfer;with modified independence;ambulating;shower seat;rolling walker Pt/caregiver will Perform Home Exercise Program: Increased strength;Both right and left upper extremity;With theraband;With written HEP provided;Independently  OT Frequency: Min 2X/week   Barriers to D/C:            Co-evaluation              AM-PAC OT "6  Clicks" Daily Activity     Outcome Measure Help from another person eating meals?: None Help from another person taking care of personal grooming?: A Little Help from another person toileting, which includes using toliet, bedpan, or urinal?: A Little Help from another person bathing (including washing, rinsing, drying)?: A Little Help from another person to put on and taking off regular upper body clothing?: A Little Help from another person to put on and taking off regular lower body clothing?: A Little 6 Click Score: 19   End of Session Equipment Utilized During Treatment: Rolling walker Nurse Communication: Mobility status  Activity Tolerance: Patient tolerated treatment well Patient left: Other (comment)(with PT )  OT Visit Diagnosis: Muscle weakness (generalized) (M62.81)                Time: 8889-1694 OT Time Calculation (min): 14 min Charges:  OT General Charges $OT Visit: 1 Visit OT Evaluation $OT Eval Moderate Complexity: 1 Mod  Lucille Passy, OTR/L [place] Pager [phone] Office [phone]   Lucille Passy M 02/06/2019, 12:57 PM

## 2019-02-06 NOTE — Evaluation (Signed)
Physical Therapy Evaluation Patient Details Name: Judith Boone MRN: [id number] DOB: July 23, 1931 Today's Date: 02/06/2019   History of Present Illness  85 year old with history of paroxysmal A. fib on Eliquis, bil TKAs, arthritis, COPD, essential hypertension, hyperlipidemia, ankle surgery.   admitted with complains of fevers and myalgias, cough.  Chest x-ray showed patchy infiltrate of the right mid and lower lung base.  COVID-negative.  Clinical Impression  Pt admitted with above diagnosis.  Pt is independent at baseline, weaker than her baseline currently therefore recommend HHPT f/u post acute; see below for gait/O2 details.   Pt currently with functional limitations due to the deficits listed below (see PT Problem List). Pt will benefit from skilled PT to increase their independence and safety with mobility to allow discharge to the venue listed below.       Follow Up Recommendations Home health PT;Supervision for mobility/OOB    Equipment Recommendations  None recommended by PT    Recommendations for Other Services       Precautions / Restrictions Precautions Precautions: Fall Restrictions Weight Bearing Restrictions: No      Mobility  Bed Mobility Overal bed mobility: Needs Assistance Bed Mobility: Sit to Supine       Sit to supine: Min guard   General bed mobility comments: for safety  Transfers Overall transfer level: Needs assistance Equipment used: Rolling walker (2 wheeled) Transfers: Sit to/from Stand Sit to Stand: Min guard Stand pivot transfers: Min guard       General transfer comment: min guard for safety   Ambulation/Gait Ambulation/Gait assistance: Min guard;Min assist Gait Distance (Feet): 80 Feet Assistive device: Rolling walker (2 wheeled) Gait Pattern/deviations: Step-through pattern;Decreased stride length;Trunk flexed;Narrow base of support     General Gait Details: min/guard for safety overall, intermittent min assist to maneuver RW  (pt uses rollator), incr time needed, one brief standing rest d/t DOE. SpO2=90% on RA, HR 100-90 after activity  Stairs            Wheelchair Mobility    Modified Rankin (Stroke Patients Only)       Balance Overall balance assessment: Needs assistance Sitting-balance support: No upper extremity supported;Feet supported Sitting balance-Leahy Scale: Good       Standing balance-Leahy Scale: Fair Standing balance comment: reliant on UEs for dynamic tasks                             Pertinent Vitals/Pain Pain Assessment: Faces Faces Pain Scale: Hurts a little bit Pain Location: chronic back pain Pain Descriptors / Indicators: Grimacing;Sore Pain Intervention(s): Monitored during session    Home Living Family/patient expects to be discharged to:: Private residence Living Arrangements: Alone Available Help at Discharge: Family;Available 24 hours/day Type of Home: House(townhouse) Home Access: Level entry     Home Layout: One level Home Equipment: Walker - 2 wheels;Walker - 4 wheels Additional Comments: daughter coming to stay with pt for 1-2 weeks    Prior Function Level of Independence: Independent;Independent with assistive device(s)         Comments: amb with rollator for longer distances     Hand Dominance   Dominant Hand: Right    Extremity/Trunk Assessment   Upper Extremity Assessment Upper Extremity Assessment: Defer to OT evaluation;Overall [facility] for tasks assessed    Lower Extremity Assessment Lower Extremity Assessment: Generalized weakness    Cervical / Trunk Assessment Cervical / Trunk Assessment: Kyphotic  Communication   Communication: No difficulties  Cognition Arousal/Alertness:  Awake/alert Behavior During Therapy: WFL for tasks assessed/performed Overall Cognitive Status: Within Functional Limits for tasks assessed                                        General Comments General comments (skin  integrity, edema, etc.): 02 sats 92-95% on RA.  DOE 2/4    Exercises     Assessment/Plan    PT Assessment Patient needs continued PT services  PT Problem List Decreased strength;Decreased activity tolerance;Decreased balance;Decreased knowledge of use of DME;Decreased mobility       PT Treatment Interventions DME instruction;Therapeutic activities;Functional mobility training;Gait training;Therapeutic exercise;Patient/family education;Balance training    PT Goals (Current goals can be found in the Care Plan section)  Acute Rehab PT Goals Patient Stated Goal: To go home "it's boring home"  PT Goal Formulation: With patient Time For Goal Achievement: 02/13/19 Potential to Achieve Goals: Good    Frequency Min 3X/week   Barriers to discharge        Co-evaluation               AM-PAC PT "6 Clicks" Mobility  Outcome Measure Help needed turning from your back to your side while in a flat bed without using bedrails?: A Little Help needed moving from lying on your back to sitting on the side of a flat bed without using bedrails?: A Little Help needed moving to and from a bed to a chair (including a wheelchair)?: A Little Help needed standing up from a chair using your arms (e.g., wheelchair or bedside chair)?: A Little Help needed to walk in hospital room?: A Little Help needed climbing 3-5 steps with a railing? : A Little 6 Click Score: 18    End of Session   Activity Tolerance: Patient tolerated treatment well Patient left: in bed;with call bell/phone within reach;with family/visitor present;with bed alarm set   PT Visit Diagnosis: Unsteadiness on feet (R26.81);Difficulty in walking, not elsewhere classified (R26.2)    Time: 1610-9604 PT Time Calculation (min) (ACUTE ONLY): 25 min   Charges:   PT Evaluation $PT Eval Low Complexity: 1 Low PT Treatments $Gait Training: 8-22 mins        Kenyon Ana, PT  Pager: [phone] Acute Rehab Dept [facility]):  [phone]   02/06/2019   [facility] 02/06/2019, 1:09 PM

## 2019-02-06 NOTE — Progress Notes (Signed)
PROGRESS NOTE    Judith Boone  XHB:[id number] DOB: [birth year] DOA: 02/03/2019 PCP: Velna Hatchet, MD   Brief Narrative:  85 year old with history of paroxysmal A. fib on Eliquis, COPD, essential hypertension, hyperlipidemia came with complains of fevers and myalgias along with cough.  Chest x-ray showed patchy infiltrate of the right mid and lower lung base.  COVID-negative.   Assessment & Plan:   Principal Problem:   Sepsis ([place]) Active Problems:   Hypercholesteremia   Radicular leg pain   Pneumonia   AKI (acute kidney injury) ([place])   AF (paroxysmal atrial fibrillation) ([facility])   Sepsis secondary to community-acquired pneumonia, right lower lobe, POA Acute hypoxia secondary to pneumonia, 85% on room air.  2 L nasal cannula. -Follow-up culture data -IV Unasyn.  P.o. azithromycin.  Eventually should complete total 7-day course. - Seen by speech therapy-mild aspiration risk from severe reflux -PPI.  Aspiration precaution. - Respiratory panel-negative.  COVID screen-negative - Urine streptococcal -Legionella antigen  History of COPD - Home bronchodilators. -Incentive spirometer and flutter valve.  Acute kidney injury on CKD stage III - Baseline 1.4 - Holding ACE inhibitor  Paroxysmal atrial fibrillation -On metoprolol.  Continue Eliquis  Essential hypertension -Continue Norvasc.  Holding hydralazine and ACE inhibitor.  Diastolic cardiomyopathy - Appears to be more or less euvolemic.  Continue to monitor.  Chronic arthritis -Pain control  GERD -PPI  DVT prophylaxis: Eliquis Code Status: Full code Family Communication: None at bedside Disposition Plan: Maintain hospital stay due to hypoxia still requiring 2 L nasal cannula.  Plan would be to wean her off oxygen.  Hopefully discharge in next 24 hours.    Subjective: Symptomatically feels better.  Still has hypoxia down to 85% on room air.  Review of Systems Otherwise negative except as per HPI, including:  General: Denies fever, chills, night sweats or unintended weight loss. Resp: Denies cough, wheezing, shortness of breath. Cardiac: Denies chest pain, palpitations, orthopnea, paroxysmal nocturnal dyspnea. GI: Denies abdominal pain, nausea, vomiting, diarrhea or constipation GU: Denies dysuria, frequency, hesitancy or incontinence MS: Denies muscle aches, joint pain or swelling Neuro: Denies headache, neurologic deficits (focal weakness, numbness, tingling), abnormal gait Psych: Denies anxiety, depression, SI/HI/AVH Skin: Denies new rashes or lesions ID: Denies sick contacts, exotic exposures, travel  Objective: Vitals:   02/05/19 1346 02/05/19 1742 02/05/19 2028 02/06/19 0612  BP: 123/71 (!) 142/81 122/75 123/65  Pulse: (!) 105 (!) 104 100 100  Resp: 20  20 20   Temp: 98 F (36.7 C)  98.7 F (37.1 C) 98.1 F (36.7 C)  TempSrc: Oral  Oral Oral  SpO2: 93%  98% 98%  Weight:      Height:        Intake/Output Summary (Last 24 hours) at 02/06/2019 0821 Last data filed at 02/06/2019 0600 Gross per 24 hour  Intake 420 ml  Output 100 ml  Net 320 ml   Filed Weights   02/03/19 1236 02/03/19 1747  Weight: 64.9 kg 68.3 kg    Examination:  General exam: Appears calm and comfortable, 2 L nasal cannula Respiratory system: Mild rhonchi at the bases Cardiovascular system: S1 & S2 heard, RRR. No JVD, murmurs, rubs, gallops or clicks. No pedal edema. Gastrointestinal system: Abdomen is nondistended, soft and nontender. No organomegaly or masses felt. Normal bowel sounds heard. Central nervous system: Alert and oriented. No focal neurological deficits. Extremities: Symmetric 5 x 5 power. Skin: No rashes, lesions or ulcers Psychiatry: Judgement and insight appear normal. Mood & affect appropriate.  External foley   Data Reviewed:   CBC: Recent Labs  Lab 02/03/19 1223 02/04/19 0341 02/05/19 0939  WBC 15.0* 13.0* 13.0*  NEUTROABS 11.7*  --   --   HGB 10.2* 9.5* 10.3*  HCT  32.5* 30.1* 32.2*  MCV 84.0 84.6 83.6  PLT 253 232 831   Basic Metabolic Panel: Recent Labs  Lab 02/03/19 1223 02/04/19 0341 02/05/19 0440  NA 134* 139 139  K 4.1 4.4 4.0  CL 106 110 109  CO2 16* 16* 17*  GLUCOSE 147* 111* 98  BUN 39* 27* 21  CREATININE 1.86* 1.43* 1.14*  CALCIUM 8.4* 8.1* 8.3*   GFR: Estimated Creatinine Clearance: 31.1 mL/min (A) (by C-G formula based on SCr of 1.14 mg/dL (H)). Liver Function Tests: Recent Labs  Lab 02/03/19 1223  AST 26  ALT 21  ALKPHOS 85  BILITOT 0.5  PROT 6.7  ALBUMIN 3.0*   No results for input(s): LIPASE, AMYLASE in the last 168 hours. No results for input(s): AMMONIA in the last 168 hours. Coagulation Profile: Recent Labs  Lab 02/03/19 1223  INR 2.0*   Cardiac Enzymes: No results for input(s): CKTOTAL, CKMB, CKMBINDEX, TROPONINI in the last 168 hours. BNP (last 3 results) No results for input(s): PROBNP in the last 8760 hours. HbA1C: No results for input(s): HGBA1C in the last 72 hours. CBG: No results for input(s): GLUCAP in the last 168 hours. Lipid Profile: No results for input(s): CHOL, HDL, LDLCALC, TRIG, CHOLHDL, LDLDIRECT in the last 72 hours. Thyroid Function Tests: No results for input(s): TSH, T4TOTAL, FREET4, T3FREE, THYROIDAB in the last 72 hours. Anemia Panel: No results for input(s): VITAMINB12, FOLATE, FERRITIN, TIBC, IRON, RETICCTPCT in the last 72 hours. Sepsis Labs: Recent Labs  Lab 02/03/19 1223 02/03/19 1511  LATICACIDVEN 2.5* 0.9    Recent Results (from the past 240 hour(s))  Blood Culture (routine x 2)     Status: Abnormal   Collection Time: 02/03/19 12:00 PM   Specimen: BLOOD LEFT FOREARM  Result Value Ref Range Status   Specimen Description   Final    BLOOD LEFT FOREARM Performed at [facility], [place] [address] [place], [place] [address]    Special Requests   Final    BOTTLES DRAWN AEROBIC AND ANAEROBIC Blood Culture adequate volume Performed at [facility],  [address] [place], [place] [address]    Culture  Setup Time   Final    AEROBIC BOTTLE ONLY GRAM POSITIVE COCCI CRITICAL RESULT CALLED TO, READ BACK BY AND VERIFIED WITH: J GADHIA [facility] 02/03/2025 JDW    Culture (A)  Final    STAPHYLOCOCCUS SPECIES (COAGULASE NEGATIVE) THE SIGNIFICANCE OF ISOLATING THIS ORGANISM FROM A SINGLE SET OF BLOOD CULTURES WHEN MULTIPLE SETS ARE DRAWN IS UNCERTAIN. PLEASE NOTIFY THE MICROBIOLOGY DEPARTMENT WITHIN ONE WEEK IF SPECIATION AND SENSITIVITIES ARE REQUIRED. Performed at [facility], [place] [address], [place], [place] [address]    Report Status 02/05/2019 FINAL  Final  Blood Culture ID Panel (Reflexed)     Status: Abnormal   Collection Time: 02/03/19 12:00 PM  Result Value Ref Range Status   Enterococcus species NOT DETECTED NOT DETECTED Final   Listeria monocytogenes NOT DETECTED NOT DETECTED Final   Staphylococcus species DETECTED (A) NOT DETECTED Final    Comment: Methicillin (oxacillin) susceptible coagulase negative staphylococcus. Possible blood culture contaminant (unless isolated from more than one blood culture draw or clinical case suggests pathogenicity). No antibiotic treatment is indicated for blood  culture contaminants. CRITICAL RESULT CALLED TO, READ BACK BY AND VERIFIED WITH: Melodye Ped [facility] 02/04/19 2026 JDW    Staphylococcus aureus (BCID) NOT DETECTED NOT DETECTED Final   Methicillin resistance NOT DETECTED NOT DETECTED Final   Streptococcus species NOT DETECTED NOT DETECTED Final   Streptococcus agalactiae NOT DETECTED NOT DETECTED Final   Streptococcus pneumoniae NOT DETECTED NOT DETECTED Final   Streptococcus pyogenes NOT DETECTED NOT DETECTED Final   Acinetobacter baumannii NOT DETECTED NOT DETECTED Final   Enterobacteriaceae species NOT DETECTED NOT DETECTED Final   Enterobacter cloacae complex NOT DETECTED NOT DETECTED Final   Escherichia coli NOT DETECTED NOT DETECTED Final   Klebsiella oxytoca NOT DETECTED NOT  DETECTED Final   Klebsiella pneumoniae NOT DETECTED NOT DETECTED Final   Proteus species NOT DETECTED NOT DETECTED Final   Serratia marcescens NOT DETECTED NOT DETECTED Final   Haemophilus influenzae NOT DETECTED NOT DETECTED Final   Neisseria meningitidis NOT DETECTED NOT DETECTED Final   Pseudomonas aeruginosa NOT DETECTED NOT DETECTED Final   Candida albicans NOT DETECTED NOT DETECTED Final   Candida glabrata NOT DETECTED NOT DETECTED Final   Candida krusei NOT DETECTED NOT DETECTED Final   Candida parapsilosis NOT DETECTED NOT DETECTED Final   Candida tropicalis NOT DETECTED NOT DETECTED Final    Comment: Performed at [facility], [place]. [address], [place], [place] [address]  Blood Culture (routine x 2)     Status: None (Preliminary result)   Collection Time: 02/03/19 12:10 PM   Specimen: BLOOD RIGHT FOREARM  Result Value Ref Range Status   Specimen Description   Final    BLOOD RIGHT FOREARM Performed at [facility], [place] [address], [place], [place] [address]    Special Requests   Final    BOTTLES DRAWN AEROBIC AND ANAEROBIC Blood Culture adequate volume Performed at [facility], [address] [place], [place] [address]    Culture   Final    NO GROWTH 2 DAYS Performed at [facility], [place] [address] [place], [place] [address]    Report Status PENDING  Incomplete  SARS Coronavirus 2 [facility] order, Performed in [facility] hospital lab) Nasopharyngeal Nasopharyngeal Swab     Status: None   Collection Time: 02/03/19 12:23 PM   Specimen: Nasopharyngeal Swab  Result Value Ref Range Status   SARS Coronavirus 2 NEGATIVE NEGATIVE Final    Comment: (NOTE) If result is NEGATIVE SARS-CoV-2 target nucleic acids are NOT DETECTED. The SARS-CoV-2 RNA is generally detectable in upper and lower  respiratory specimens during the acute phase of infection. The lowest  concentration of SARS-CoV-2 viral copies this assay can detect is 250  copies / mL. A  negative result does not preclude SARS-CoV-2 infection  and should not be used as the sole basis for treatment or other  patient management decisions.  A negative result may occur with  improper specimen collection / handling, submission of specimen other  than nasopharyngeal swab, presence of viral mutation(s) within the  areas targeted by this assay, and inadequate number of viral copies  (<250 copies / mL). A negative result must be combined with clinical  observations, patient history, and epidemiological information. If result is POSITIVE SARS-CoV-2 target nucleic acids are DETECTED. The SARS-CoV-2 RNA is generally detectable in upper and lower  respiratory specimens dur ing the acute phase of infection.  Positive  results are indicative of active infection with SARS-CoV-2.  Clinical  correlation with patient history and  other diagnostic information is  necessary to determine patient infection status.  Positive results do  not rule out bacterial infection or co-infection with other viruses. If result is PRESUMPTIVE POSTIVE SARS-CoV-2 nucleic acids MAY BE PRESENT.   A presumptive positive result was obtained on the submitted specimen  and confirmed on repeat testing.  While 2019 novel coronavirus  (SARS-CoV-2) nucleic acids may be present in the submitted sample  additional confirmatory testing may be necessary for epidemiological  and / or clinical management purposes  to differentiate between  SARS-CoV-2 and other Sarbecovirus currently known to infect humans.  If clinically indicated additional testing with an alternate test  methodology [phone]) is advised. The SARS-CoV-2 RNA is generally  detectable in upper and lower respiratory sp ecimens during the acute  phase of infection. The expected result is Negative. Fact Sheet for Patients:  StrictlyIdeas.no Fact Sheet for Healthcare Providers: BankingDealers.co.za This test is not  yet approved or cleared by the [place] FDA and has been authorized for detection and/or diagnosis of SARS-CoV-2 by FDA under an Emergency Use Authorization (EUA).  This EUA will remain in effect (meaning this test can be used) for the duration of the COVID-19 declaration under Section 564(b)(1) of the Act, 21 U.S.C. section 360bbb-3(b)(1), unless the authorization is terminated or revoked sooner. Performed at [facility], [address] [place], [place] [address]   Respiratory Panel by PCR     Status: None   Collection Time: 02/04/19  8:40 AM   Specimen: Nasopharyngeal Swab; Respiratory  Result Value Ref Range Status   Adenovirus NOT DETECTED NOT DETECTED Final   Coronavirus 229E NOT DETECTED NOT DETECTED Final    Comment: (NOTE) The Coronavirus on the Respiratory Panel, DOES NOT test for the novel  Coronavirus (2019 nCoV)    Coronavirus HKU1 NOT DETECTED NOT DETECTED Final   Coronavirus NL63 NOT DETECTED NOT DETECTED Final   Coronavirus OC43 NOT DETECTED NOT DETECTED Final   Metapneumovirus NOT DETECTED NOT DETECTED Final   Rhinovirus / Enterovirus NOT DETECTED NOT DETECTED Final   Influenza A NOT DETECTED NOT DETECTED Final   Influenza B NOT DETECTED NOT DETECTED Final   Parainfluenza Virus 1 NOT DETECTED NOT DETECTED Final   Parainfluenza Virus 2 NOT DETECTED NOT DETECTED Final   Parainfluenza Virus 3 NOT DETECTED NOT DETECTED Final   Parainfluenza Virus 4 NOT DETECTED NOT DETECTED Final   Respiratory Syncytial Virus NOT DETECTED NOT DETECTED Final   Bordetella pertussis NOT DETECTED NOT DETECTED Final   Chlamydophila pneumoniae NOT DETECTED NOT DETECTED Final   Mycoplasma pneumoniae NOT DETECTED NOT DETECTED Final    Comment: Performed at [facility], [place]. [address], [place], [place] [address]         Radiology Studies: No results found.      Scheduled Meds: . amLODipine  2.5 mg Oral Daily  . apixaban  2.5 mg Oral BID  . atorvastatin   20 mg Oral q1800  . azithromycin  500 mg Oral Daily  . loratadine  10 mg Oral Daily  . metoprolol succinate  50 mg Oral Daily  . pantoprazole  40 mg Oral QHS  . pramipexole  0.125 mg Oral QHS  . umeclidinium-vilanterol  1 puff Inhalation Daily   Continuous Infusions: . ampicillin-sulbactam (UNASYN) IV 3 g (02/06/19 0219)     LOS: 3 days   Time spent= 27 mins    Isaura Schiller Arsenio Loader, MD [facility]  If 7PM-7AM, please contact night-coverage [email or web address]  02/06/2019, 8:21 AM

## 2019-02-07 LAB — CBC
HCT: 26.5 % — ABNORMAL LOW (ref 36.0–46.0)
Hemoglobin: 8.6 g/dL — ABNORMAL LOW (ref 12.0–15.0)
MCH: 27 pg (ref 26.0–34.0)
MCHC: 32.5 g/dL (ref 30.0–36.0)
MCV: 83.1 fL (ref 80.0–100.0)
Platelets: 272 10*3/uL (ref 150–400)
RBC: 3.19 MIL/uL — ABNORMAL LOW (ref 3.87–5.11)
RDW: 20.1 % — ABNORMAL HIGH (ref 11.5–15.5)
WBC: 9.1 10*3/uL (ref 4.0–10.5)
nRBC: 0 % (ref 0.0–0.2)

## 2019-02-07 LAB — MAGNESIUM: Magnesium: 1.5 mg/dL — ABNORMAL LOW (ref 1.7–2.4)

## 2019-02-07 LAB — BRAIN NATRIURETIC PEPTIDE: B Natriuretic Peptide: 186 pg/mL — ABNORMAL HIGH (ref 0.0–100.0)

## 2019-02-07 LAB — BASIC METABOLIC PANEL
Anion gap: 11 (ref 5–15)
BUN: 21 mg/dL (ref 8–23)
CO2: 20 mmol/L — ABNORMAL LOW (ref 22–32)
Calcium: 7.9 mg/dL — ABNORMAL LOW (ref 8.9–10.3)
Chloride: 111 mmol/L (ref 98–111)
Creatinine, Ser: 1.32 mg/dL — ABNORMAL HIGH (ref 0.44–1.00)
GFR calc Af Amer: 43 mL/min — ABNORMAL LOW (ref >60)
GFR calc non Af Amer: 37 mL/min — ABNORMAL LOW (ref >60)
Glucose, Bld: 98 mg/dL (ref 70–99)
Potassium: 3.5 mmol/L (ref 3.5–5.1)
Sodium: 142 mmol/L (ref 135–145)

## 2019-02-07 LAB — PROCALCITONIN: Procalcitonin: 0.2 ng/mL

## 2019-02-07 MED ORDER — MAGNESIUM SULFATE 2 GM/50ML IV SOLN
2.0000 g | Freq: Once | INTRAVENOUS | Status: AC
  Administered 2019-02-07: 09:00:00 2 g via INTRAVENOUS
  Filled 2019-02-07: qty 50

## 2019-02-07 MED ORDER — SODIUM CHLORIDE 0.9 % IV SOLN
3.0000 g | Freq: Two times a day (BID) | INTRAVENOUS | Status: DC
  Filled 2019-02-07: qty 8

## 2019-02-07 MED ORDER — AMOXICILLIN-POT CLAVULANATE 875-125 MG PO TABS: 1.0000 | ORAL_TABLET | Freq: Two times a day (BID) | ORAL | 0 refills | Status: AC

## 2019-02-07 MED ORDER — POTASSIUM CHLORIDE CRYS ER 20 MEQ PO TBCR
40.0000 meq | EXTENDED_RELEASE_TABLET | Freq: Once | ORAL | Status: AC
  Administered 2019-02-07: 40 meq via ORAL
  Filled 2019-02-07: qty 2

## 2019-02-07 NOTE — Progress Notes (Signed)
Discharge instructions reviewed. Questions concerns were denied. No change from am assessment.

## 2019-02-07 NOTE — Progress Notes (Signed)
Patient is reluctant to take potassium this am due history kidney disease. Education provided. Will update provider.

## 2019-02-07 NOTE — Progress Notes (Signed)
PHARMACY NOTE:  ANTIMICROBIAL RENAL DOSAGE ADJUSTMENT  Current antimicrobial regimen includes a mismatch between antimicrobial dosage and estimated renal function. As per policy approved by the Pharmacy & Therapeutics and Medical Executive Committees, the antimicrobial dosage will be adjusted accordingly.  Current antimicrobial and dosage:  Unasyn 3 g q8 hr  Indication: aspiration PNA  Renal Function:   Estimated Creatinine Clearance: 26.9 mL/min (A) (by C-G formula based on SCr of 1.32 mg/dL (H)). []      On intermittent HD, scheduled: []      On CRRT    Antimicrobial dosage has been changed to:  3g IV q12 hr   Additional Comments: n/a   Thank you for allowing pharmacy to be a part of this patient's care.  Reuel Boom, PharmD, BCPS [phone] 02/07/2019, 12:23 PM

## 2019-02-07 NOTE — Care Management Important Message (Signed)
Important Message  Patient Details IM Letter given to Cookie McGibboney RN to present to the Patient Name: Judith Boone MRN: [id number] Date of Birth: [birth year]   Medicare Important Message Given:  Yes     Kerin Salen 02/07/2019, 11:43 AM

## 2019-02-07 NOTE — TOC Progression Note (Addendum)
Transition of Care [facility]) - Progression Note    Patient Details  Name: REIS PIENTA MRN: [id number] Date of Birth: May 02, 1931  Transition of Care [facility]) CM/SW Contact  Purcell Mouton, RN Phone Number: 02/07/2019, 11:15 AM  Clinical Narrative:    Pt plan to discharge home with daughter at bedside. Pt selected  [facility]. Referral given to [facility] with Arville Go.    Expected Discharge Plan: [place]    Expected Discharge Plan and Services Expected Discharge Plan: [place]   Discharge Planning Services: CM Consult Post Acute Care Choice: [place] arrangements for the past 2 months: Single Family Home Expected Discharge Date: 02/07/19                         HH Arranged: PT, OT HH Agency: [facility]) Date [place]: 02/07/19 Time [place]: 1114 Representative spoke with at [place]: [place] ([place]) Interventions    Readmission Risk Interventions No flowsheet data found.

## 2019-02-07 NOTE — Discharge Summary (Signed)
Physician Discharge Summary  Judith Boone JFH:[id number] DOB: 09/28/1931 DOA: 02/03/2019  PCP: Velna Hatchet, MD  Admit date: 02/03/2019 Discharge date: 02/07/2019  Admitted From: Home Disposition: Home with home health  Recommendations for Outpatient Follow-up:  1. Follow up with PCP in 1-2 weeks 2. Please obtain BMP/CBC in one week your next doctors visit.  3. Augmentin for 7 days 4. Encouraged to use incentive spirometer and flutter valve  Home Health: PT/OT Equipment/Devices: None Discharge Condition: Stable CODE STATUS: Full code Diet recommendation: Heart healthy  Brief/Interim Summary: 85 year old with history of paroxysmal A. fib on Eliquis, COPD, essential hypertension, hyperlipidemia came with complains of fevers and myalgias along with cough.  Chest x-ray showed patchy infiltrate of the right mid and lower lung base.  COVID-negative. During the hospitalization treated with IV Unasyn and p.o. azithromycin due to concerns of aspiration pneumonia eventually transitioned to oral Augmentin at the time of discharge.  Respiratory panel was negative.  Saturating greater than 90% on room air.  Ambulating okay with minimal assistance.  She wanted to go home today therefore home health arrangements made.   Discharge Diagnoses:  Principal Problem:   Sepsis ([place]) Active Problems:   Hypercholesteremia   Radicular leg pain   Pneumonia   AKI (acute kidney injury) ([place])   AF (paroxysmal atrial fibrillation) ([facility])   Sepsis secondary to community-acquired pneumonia, right lower lobe, POA Acute hypoxia secondary to pneumonia, resolved -Feeling better.  Now on room air saturating greater than 90% -Transition to oral Augmentin for 7 more days. -PPI.  Aspiration precaution. - Respiratory panel-negative.  COVID screen-negative  History of COPD - Home bronchodilators. -Incentive spirometer and flutter valve.  Acute kidney injury on CKD stage III - Baseline 1.4.  Resolved, resume  home meds  Paroxysmal atrial fibrillation -On metoprolol.  Continue Eliquis  Essential hypertension -Resume home meds  Diastolic cardiomyopathy - Appears to be more or less euvolemic.  Continue to monitor.  Chronic arthritis -Pain control  GERD -PPI   Consultations:  None  Subjective: Feels much better, wishes to go home.  Discharge Exam: Vitals:   02/07/19 0853 02/07/19 0913  BP:  136/66  Pulse:  78  Resp:  16  Temp:    SpO2: 92% 92%   Vitals:   02/07/19 0703 02/07/19 0851 02/07/19 0853 02/07/19 0913  BP: (!) 125/56   136/66  Pulse: 77   78  Resp: 16   16  Temp: 98.4 F (36.9 C)     TempSrc: Oral     SpO2: 91% 92% 92% 92%  Weight:      Height:        General: Pt is alert, awake, not in acute distress Cardiovascular: RRR, S1/S2 +, no rubs, no gallops Respiratory: CTA bilaterally, no wheezing, no rhonchi Abdominal: Soft, NT, ND, bowel sounds + Extremities: no edema, no cyanosis  Discharge Instructions  Discharge Instructions    Diet - low sodium heart healthy   Complete by: As directed    Increase activity slowly   Complete by: As directed      Allergies as of 02/07/2019      Reactions   Bacitracin    unknown   Neosporin [neomycin-bacitracin Zn-polymyx]    unknown   Polysporin [bacitracin-polymyxin B]    unknown      Medication List    TAKE these medications   amLODipine 5 MG tablet Commonly known as: NORVASC Take 5 mg by mouth at bedtime.   amoxicillin-clavulanate 875-125 MG tablet Commonly known as:  Augmentin Take 1 tablet by mouth every 12 (twelve) hours for 7 days.   Anoro Ellipta 62.5-25 MCG/INH Aepb Generic drug: umeclidinium-vilanterol Inhale 1 puff into the lungs daily.   apixaban 2.5 MG Tabs tablet Commonly known as: ELIQUIS Take 1 tablet (2.5 mg total) by mouth 2 (two) times daily for 30 days.   atorvastatin 20 MG tablet Commonly known as: LIPITOR Take 20 mg by mouth at bedtime.   cetirizine 10 MG  tablet Commonly known as: ZYRTEC Take 10 mg by mouth daily.   famotidine 20 MG tablet Commonly known as: PEPCID Take 20 mg by mouth at bedtime.   hydrALAZINE 25 MG tablet Commonly known as: APRESOLINE Take 25 mg by mouth 2 (two) times daily.   metoprolol succinate 25 MG 24 hr tablet Commonly known as: TOPROL-XL Take 1 tablet (25 mg total) by mouth daily. What changed: when to take this   omeprazole 40 MG capsule Commonly known as: PRILOSEC Take 40 mg by mouth daily.   pramipexole 0.125 MG tablet Commonly known as: MIRAPEX Take 0.125 mg by mouth at bedtime.   quinapril 40 MG tablet Commonly known as: ACCUPRIL Take 40 mg by mouth daily.   traMADol 50 MG tablet Commonly known as: ULTRAM Take 50 mg by mouth at bedtime as needed for moderate pain.   Ventolin HFA 108 (90 Base) MCG/ACT inhaler Generic drug: albuterol Inhale 1-2 puffs into the lungs every 6 (six) hours as needed for wheezing.      Follow-up Information    Velna Hatchet, MD. Schedule an appointment as soon as possible for a visit in 1 week(s).   Specialty: Internal Medicine Contact information: [place] [address] [place], Home Health Follow up.   Why: Will follow in 48 hours after discharge.  Contact information: [address] [place] [address] [phone]          Allergies  Allergen Reactions  . Bacitracin     unknown  . Neosporin [Neomycin-Bacitracin Zn-Polymyx]     unknown  . Polysporin [Bacitracin-Polymyxin B]     unknown    You were cared for by a hospitalist during your hospital stay. If you have any questions about your discharge medications or the care you received while you were in the hospital after you are discharged, you can call the unit and asked to speak with the hospitalist on call if the hospitalist that took care of you is not available. Once you are discharged, your primary care physician will handle any  further medical issues. Please note that no refills for any discharge medications will be authorized once you are discharged, as it is imperative that you return to your primary care physician (or establish a relationship with a primary care physician if you do not have one) for your aftercare needs so that they can reassess your need for medications and monitor your lab values.   Procedures/Studies: Dg Chest Portable 1 View  Result Date: 02/03/2019 CLINICAL DATA:  Cough and shortness of breath EXAM: PORTABLE CHEST 1 VIEW COMPARISON:  07/17/2018 FINDINGS: Cardiac shadow is stable. Aortic calcifications are again seen. Hiatal hernia is again noted. Patchy infiltrate is noted primarily within the right mid lung but extending into the right lung base. The left lung is clear. No bony abnormality is seen. IMPRESSION: Patchy infiltrate on the left primarily in the mid lung laterally. Electronically Signed   By: Inez Catalina M.D.   On:  02/03/2019 13:32      The results of significant diagnostics from this hospitalization (including imaging, microbiology, ancillary and laboratory) are listed below for reference.     Microbiology: Recent Results (from the past 240 hour(s))  Blood Culture (routine x 2)     Status: Abnormal   Collection Time: 02/03/19 12:00 PM   Specimen: BLOOD LEFT FOREARM  Result Value Ref Range Status   Specimen Description   Final    BLOOD LEFT FOREARM Performed at [facility], [address], [place], [place] [address]    Special Requests   Final    BOTTLES DRAWN AEROBIC AND ANAEROBIC Blood Culture adequate volume Performed at [facility], [address] [place], [place] [address]    Culture  Setup Time   Final    AEROBIC BOTTLE ONLY GRAM POSITIVE COCCI CRITICAL RESULT CALLED TO, READ BACK BY AND VERIFIED WITH: J GADHIA [place] 02/03/2025 JDW    Culture (A)  Final    STAPHYLOCOCCUS SPECIES (COAGULASE NEGATIVE) THE SIGNIFICANCE OF ISOLATING THIS ORGANISM  FROM A SINGLE SET OF BLOOD CULTURES WHEN MULTIPLE SETS ARE DRAWN IS UNCERTAIN. PLEASE NOTIFY THE MICROBIOLOGY DEPARTMENT WITHIN ONE WEEK IF SPECIATION AND SENSITIVITIES ARE REQUIRED. Performed at [facility], [place] [address] [place], [place] [address]    Report Status 02/05/2019 FINAL  Final  Blood Culture ID Panel (Reflexed)     Status: Abnormal   Collection Time: 02/03/19 12:00 PM  Result Value Ref Range Status   Enterococcus species NOT DETECTED NOT DETECTED Final   Listeria monocytogenes NOT DETECTED NOT DETECTED Final   Staphylococcus species DETECTED (A) NOT DETECTED Final    Comment: Methicillin (oxacillin) susceptible coagulase negative staphylococcus. Possible blood culture contaminant (unless isolated from more than one blood culture draw or clinical case suggests pathogenicity). No antibiotic treatment is indicated for blood  culture contaminants. CRITICAL RESULT CALLED TO, READ BACK BY AND VERIFIED WITH: Melodye Ped [facility] 02/04/19 2026 JDW    Staphylococcus aureus (BCID) NOT DETECTED NOT DETECTED Final   Methicillin resistance NOT DETECTED NOT DETECTED Final   Streptococcus species NOT DETECTED NOT DETECTED Final   Streptococcus agalactiae NOT DETECTED NOT DETECTED Final   Streptococcus pneumoniae NOT DETECTED NOT DETECTED Final   Streptococcus pyogenes NOT DETECTED NOT DETECTED Final   Acinetobacter baumannii NOT DETECTED NOT DETECTED Final   Enterobacteriaceae species NOT DETECTED NOT DETECTED Final   Enterobacter cloacae complex NOT DETECTED NOT DETECTED Final   Escherichia coli NOT DETECTED NOT DETECTED Final   Klebsiella oxytoca NOT DETECTED NOT DETECTED Final   Klebsiella pneumoniae NOT DETECTED NOT DETECTED Final   Proteus species NOT DETECTED NOT DETECTED Final   Serratia marcescens NOT DETECTED NOT DETECTED Final   Haemophilus influenzae NOT DETECTED NOT DETECTED Final   Neisseria meningitidis NOT DETECTED NOT DETECTED Final   Pseudomonas aeruginosa NOT  DETECTED NOT DETECTED Final   Candida albicans NOT DETECTED NOT DETECTED Final   Candida glabrata NOT DETECTED NOT DETECTED Final   Candida krusei NOT DETECTED NOT DETECTED Final   Candida parapsilosis NOT DETECTED NOT DETECTED Final   Candida tropicalis NOT DETECTED NOT DETECTED Final    Comment: Performed at [facility], [place]. [address], [place],  [address]  Blood Culture (routine x 2)     Status: None (Preliminary result)   Collection Time: 02/03/19 12:10 PM   Specimen: BLOOD RIGHT FOREARM  Result Value Ref Range Status   Specimen Description   Final  BLOOD RIGHT FOREARM Performed at [facility], [place] [address], [place], [place] [address]    Special Requests   Final    BOTTLES DRAWN AEROBIC AND ANAEROBIC Blood Culture adequate volume Performed at [facility], [place] [place], [place] [address]    Culture   Final    NO GROWTH 4 DAYS Performed at [facility], [place] [address], [place], [place] [address]    Report Status PENDING  Incomplete  SARS Coronavirus 2 [facility] order, Performed in [facility] hospital lab) Nasopharyngeal Nasopharyngeal Swab     Status: None   Collection Time: 02/03/19 12:23 PM   Specimen: Nasopharyngeal Swab  Result Value Ref Range Status   SARS Coronavirus 2 NEGATIVE NEGATIVE Final    Comment: (NOTE) If result is NEGATIVE SARS-CoV-2 target nucleic acids are NOT DETECTED. The SARS-CoV-2 RNA is generally detectable in upper and lower  respiratory specimens during the acute phase of infection. The lowest  concentration of SARS-CoV-2 viral copies this assay can detect is 250  copies / mL. A negative result does not preclude SARS-CoV-2 infection  and should not be used as the sole basis for treatment or other  patient management decisions.  A negative result may occur with  improper specimen collection / handling, submission of specimen other  than nasopharyngeal swab, presence of viral mutation(s) within the   areas targeted by this assay, and inadequate number of viral copies  (<250 copies / mL). A negative result must be combined with clinical  observations, patient history, and epidemiological information. If result is POSITIVE SARS-CoV-2 target nucleic acids are DETECTED. The SARS-CoV-2 RNA is generally detectable in upper and lower  respiratory specimens dur ing the acute phase of infection.  Positive  results are indicative of active infection with SARS-CoV-2.  Clinical  correlation with patient history and other diagnostic information is  necessary to determine patient infection status.  Positive results do  not rule out bacterial infection or co-infection with other viruses. If result is PRESUMPTIVE POSTIVE SARS-CoV-2 nucleic acids MAY BE PRESENT.   A presumptive positive result was obtained on the submitted specimen  and confirmed on repeat testing.  While 2019 novel coronavirus  (SARS-CoV-2) nucleic acids may be present in the submitted sample  additional confirmatory testing may be necessary for epidemiological  and / or clinical management purposes  to differentiate between  SARS-CoV-2 and other Sarbecovirus currently known to infect humans.  If clinically indicated additional testing with an alternate test  methodology [phone]) is advised. The SARS-CoV-2 RNA is generally  detectable in upper and lower respiratory sp ecimens during the acute  phase of infection. The expected result is Negative. Fact Sheet for Patients:  StrictlyIdeas.no Fact Sheet for Healthcare Providers: BankingDealers.co.za This test is not yet approved or cleared by the [place] FDA and has been authorized for detection and/or diagnosis of SARS-CoV-2 by FDA under an Emergency Use Authorization (EUA).  This EUA will remain in effect (meaning this test can be used) for the duration of the COVID-19 declaration under Section 564(b)(1) of the Act, 21  U.S.C. section 360bbb-3(b)(1), unless the authorization is terminated or revoked sooner. Performed at [facility], [address] [place], [place] [address]   Respiratory Panel by PCR     Status: None   Collection Time: 02/04/19  8:40 AM   Specimen: Nasopharyngeal Swab; Respiratory  Result Value Ref Range Status   Adenovirus NOT DETECTED NOT DETECTED Final  Coronavirus 229E NOT DETECTED NOT DETECTED Final    Comment: (NOTE) The Coronavirus on the Respiratory Panel, DOES NOT test for the novel  Coronavirus (2019 nCoV)    Coronavirus HKU1 NOT DETECTED NOT DETECTED Final   Coronavirus NL63 NOT DETECTED NOT DETECTED Final   Coronavirus OC43 NOT DETECTED NOT DETECTED Final   Metapneumovirus NOT DETECTED NOT DETECTED Final   Rhinovirus / Enterovirus NOT DETECTED NOT DETECTED Final   Influenza A NOT DETECTED NOT DETECTED Final   Influenza B NOT DETECTED NOT DETECTED Final   Parainfluenza Virus 1 NOT DETECTED NOT DETECTED Final   Parainfluenza Virus 2 NOT DETECTED NOT DETECTED Final   Parainfluenza Virus 3 NOT DETECTED NOT DETECTED Final   Parainfluenza Virus 4 NOT DETECTED NOT DETECTED Final   Respiratory Syncytial Virus NOT DETECTED NOT DETECTED Final   Bordetella pertussis NOT DETECTED NOT DETECTED Final   Chlamydophila pneumoniae NOT DETECTED NOT DETECTED Final   Mycoplasma pneumoniae NOT DETECTED NOT DETECTED Final    Comment: Performed at [facility], [place] [address], [place], [place] [address]     Labs: BNP (last 3 results) Recent Labs    02/03/19 1223 02/04/19 0341 02/07/19 0413  BNP 348.8* 512.0* 604.5*   Basic Metabolic Panel: Recent Labs  Lab 02/03/19 1223 02/04/19 0341 02/05/19 0440 02/07/19 0413  NA 134* 139 139 142  K 4.1 4.4 4.0 3.5  CL 106 110 109 111  CO2 16* 16* 17* 20*  GLUCOSE 147* 111* 98 98  BUN 39* 27* 21 21  CREATININE 1.86* 1.43* 1.14* 1.32*  CALCIUM 8.4* 8.1* 8.3* 7.9*  MG  --   --   --  1.5*   Liver Function  Tests: Recent Labs  Lab 02/03/19 1223  AST 26  ALT 21  ALKPHOS 85  BILITOT 0.5  PROT 6.7  ALBUMIN 3.0*   No results for input(s): LIPASE, AMYLASE in the last 168 hours. No results for input(s): AMMONIA in the last 168 hours. CBC: Recent Labs  Lab 02/03/19 1223 02/04/19 0341 02/05/19 0939 02/07/19 0413  WBC 15.0* 13.0* 13.0* 9.1  NEUTROABS 11.7*  --   --   --   HGB 10.2* 9.5* 10.3* 8.6*  HCT 32.5* 30.1* 32.2* 26.5*  MCV 84.0 84.6 83.6 83.1  PLT 253 232 267 272   Cardiac Enzymes: No results for input(s): CKTOTAL, CKMB, CKMBINDEX, TROPONINI in the last 168 hours. BNP: Invalid input(s): POCBNP CBG: No results for input(s): GLUCAP in the last 168 hours. D-Dimer No results for input(s): DDIMER in the last 72 hours. Hgb A1c No results for input(s): HGBA1C in the last 72 hours. Lipid Profile No results for input(s): CHOL, HDL, LDLCALC, TRIG, CHOLHDL, LDLDIRECT in the last 72 hours. Thyroid function studies No results for input(s): TSH, T4TOTAL, T3FREE, THYROIDAB in the last 72 hours.  Invalid input(s): FREET3 Anemia work up No results for input(s): VITAMINB12, FOLATE, FERRITIN, TIBC, IRON, RETICCTPCT in the last 72 hours. Urinalysis    Component Value Date/Time   COLORURINE YELLOW 02/03/2019 [place] 02/03/2019 1235   LABSPEC 1.010 02/03/2019 1235   PHURINE 5.5 02/03/2019 1235   GLUCOSEU NEGATIVE 02/03/2019 1235   HGBUR NEGATIVE 02/03/2019 [place] 02/03/2019 [place] 02/03/2019 1235   PROTEINUR NEGATIVE 02/03/2019 1235   UROBILINOGEN 0.2 08/17/2014 0000   NITRITE NEGATIVE 02/03/2019 [place] 02/03/2019 1235   Sepsis Labs Invalid input(s): PROCALCITONIN,  WBC,  LACTICIDVEN Microbiology Recent Results (from  the past 240 hour(s))  Blood Culture (routine x 2)     Status: Abnormal   Collection Time: 02/03/19 12:00 PM   Specimen: BLOOD LEFT FOREARM  Result Value Ref Range Status   Specimen  Description   Final    BLOOD LEFT FOREARM Performed at [facility], [place] [address] [place], [place] [address]    Special Requests   Final    BOTTLES DRAWN AEROBIC AND ANAEROBIC Blood Culture adequate volume Performed at [facility], [place] [place], [place] [address]    Culture  Setup Time   Final    AEROBIC BOTTLE ONLY GRAM POSITIVE COCCI CRITICAL RESULT CALLED TO, READ BACK BY AND VERIFIED WITH: J GADHIA [facility] 02/03/2025 JDW    Culture (A)  Final    STAPHYLOCOCCUS SPECIES (COAGULASE NEGATIVE) THE SIGNIFICANCE OF ISOLATING THIS ORGANISM FROM A SINGLE SET OF BLOOD CULTURES WHEN MULTIPLE SETS ARE DRAWN IS UNCERTAIN. PLEASE NOTIFY THE MICROBIOLOGY DEPARTMENT WITHIN ONE WEEK IF SPECIATION AND SENSITIVITIES ARE REQUIRED. Performed at [facility], [place] [address] [place], [place] [address]    Report Status 02/05/2019 FINAL  Final  Blood Culture ID Panel (Reflexed)     Status: Abnormal   Collection Time: 02/03/19 12:00 PM  Result Value Ref Range Status   Enterococcus species NOT DETECTED NOT DETECTED Final   Listeria monocytogenes NOT DETECTED NOT DETECTED Final   Staphylococcus species DETECTED (A) NOT DETECTED Final    Comment: Methicillin (oxacillin) susceptible coagulase negative staphylococcus. Possible blood culture contaminant (unless isolated from more than one blood culture draw or clinical case suggests pathogenicity). No antibiotic treatment is indicated for blood  culture contaminants. CRITICAL RESULT CALLED TO, READ BACK BY AND VERIFIED WITH: Melodye Ped [facility] 02/04/19 2026 JDW    Staphylococcus aureus (BCID) NOT DETECTED NOT DETECTED Final   Methicillin resistance NOT DETECTED NOT DETECTED Final   Streptococcus species NOT DETECTED NOT DETECTED Final   Streptococcus agalactiae NOT DETECTED NOT DETECTED Final   Streptococcus pneumoniae NOT DETECTED NOT DETECTED Final   Streptococcus pyogenes NOT DETECTED NOT DETECTED Final   Acinetobacter  baumannii NOT DETECTED NOT DETECTED Final   Enterobacteriaceae species NOT DETECTED NOT DETECTED Final   Enterobacter cloacae complex NOT DETECTED NOT DETECTED Final   Escherichia coli NOT DETECTED NOT DETECTED Final   Klebsiella oxytoca NOT DETECTED NOT DETECTED Final   Klebsiella pneumoniae NOT DETECTED NOT DETECTED Final   Proteus species NOT DETECTED NOT DETECTED Final   Serratia marcescens NOT DETECTED NOT DETECTED Final   Haemophilus influenzae NOT DETECTED NOT DETECTED Final   Neisseria meningitidis NOT DETECTED NOT DETECTED Final   Pseudomonas aeruginosa NOT DETECTED NOT DETECTED Final   Candida albicans NOT DETECTED NOT DETECTED Final   Candida glabrata NOT DETECTED NOT DETECTED Final   Candida krusei NOT DETECTED NOT DETECTED Final   Candida parapsilosis NOT DETECTED NOT DETECTED Final   Candida tropicalis NOT DETECTED NOT DETECTED Final    Comment: Performed at [facility], [place]. [address] [place], [place] [address]  Blood Culture (routine x 2)     Status: None (Preliminary result)   Collection Time: 02/03/19 12:10 PM   Specimen: BLOOD RIGHT FOREARM  Result Value Ref Range Status   Specimen Description   Final    BLOOD RIGHT FOREARM Performed at [facility], [place] [address] [place],  [address]    Special Requests   Final    BOTTLES DRAWN AEROBIC AND ANAEROBIC  Blood Culture adequate volume Performed at [facility], [place] [place], [place] [address]    Culture   Final    NO GROWTH 4 DAYS Performed at [facility], [place] [address], [place], [place] [address]    Report Status PENDING  Incomplete  SARS Coronavirus 2 [facility] order, Performed in [facility] hospital lab) Nasopharyngeal Nasopharyngeal Swab     Status: None   Collection Time: 02/03/19 12:23 PM   Specimen: Nasopharyngeal Swab  Result Value Ref Range Status   SARS Coronavirus 2 NEGATIVE NEGATIVE Final    Comment: (NOTE) If result is NEGATIVE SARS-CoV-2 target  nucleic acids are NOT DETECTED. The SARS-CoV-2 RNA is generally detectable in upper and lower  respiratory specimens during the acute phase of infection. The lowest  concentration of SARS-CoV-2 viral copies this assay can detect is 250  copies / mL. A negative result does not preclude SARS-CoV-2 infection  and should not be used as the sole basis for treatment or other  patient management decisions.  A negative result may occur with  improper specimen collection / handling, submission of specimen other  than nasopharyngeal swab, presence of viral mutation(s) within the  areas targeted by this assay, and inadequate number of viral copies  (<250 copies / mL). A negative result must be combined with clinical  observations, patient history, and epidemiological information. If result is POSITIVE SARS-CoV-2 target nucleic acids are DETECTED. The SARS-CoV-2 RNA is generally detectable in upper and lower  respiratory specimens dur ing the acute phase of infection.  Positive  results are indicative of active infection with SARS-CoV-2.  Clinical  correlation with patient history and other diagnostic information is  necessary to determine patient infection status.  Positive results do  not rule out bacterial infection or co-infection with other viruses. If result is PRESUMPTIVE POSTIVE SARS-CoV-2 nucleic acids MAY BE PRESENT.   A presumptive positive result was obtained on the submitted specimen  and confirmed on repeat testing.  While 2019 novel coronavirus  (SARS-CoV-2) nucleic acids may be present in the submitted sample  additional confirmatory testing may be necessary for epidemiological  and / or clinical management purposes  to differentiate between  SARS-CoV-2 and other Sarbecovirus currently known to infect humans.  If clinically indicated additional testing with an alternate test  methodology [phone]) is advised. The SARS-CoV-2 RNA is generally  detectable in upper and lower  respiratory sp ecimens during the acute  phase of infection. The expected result is Negative. Fact Sheet for Patients:  StrictlyIdeas.no Fact Sheet for Healthcare Providers: BankingDealers.co.za This test is not yet approved or cleared by the [place] FDA and has been authorized for detection and/or diagnosis of SARS-CoV-2 by FDA under an Emergency Use Authorization (EUA).  This EUA will remain in effect (meaning this test can be used) for the duration of the COVID-19 declaration under Section 564(b)(1) of the Act, 21 U.S.C. section 360bbb-3(b)(1), unless the authorization is terminated or revoked sooner. Performed at [facility], [place] [place], [place] [address]   Respiratory Panel by PCR     Status: None   Collection Time: 02/04/19  8:40 AM   Specimen: Nasopharyngeal Swab; Respiratory  Result Value Ref Range Status   Adenovirus NOT DETECTED NOT DETECTED Final   Coronavirus 229E NOT DETECTED NOT DETECTED Final    Comment: (NOTE) The Coronavirus on the Respiratory Panel, DOES NOT test for the novel  Coronavirus (2019 nCoV)    Coronavirus  HKU1 NOT DETECTED NOT DETECTED Final   Coronavirus NL63 NOT DETECTED NOT DETECTED Final   Coronavirus OC43 NOT DETECTED NOT DETECTED Final   Metapneumovirus NOT DETECTED NOT DETECTED Final   Rhinovirus / Enterovirus NOT DETECTED NOT DETECTED Final   Influenza A NOT DETECTED NOT DETECTED Final   Influenza B NOT DETECTED NOT DETECTED Final   Parainfluenza Virus 1 NOT DETECTED NOT DETECTED Final   Parainfluenza Virus 2 NOT DETECTED NOT DETECTED Final   Parainfluenza Virus 3 NOT DETECTED NOT DETECTED Final   Parainfluenza Virus 4 NOT DETECTED NOT DETECTED Final   Respiratory Syncytial Virus NOT DETECTED NOT DETECTED Final   Bordetella pertussis NOT DETECTED NOT DETECTED Final   Chlamydophila pneumoniae NOT DETECTED NOT DETECTED Final   Mycoplasma pneumoniae NOT DETECTED NOT  DETECTED Final    Comment: Performed at [facility], [place] [address], [place], [place] [address]     Time coordinating discharge:  I have spent 35 minutes face to face with the patient and on the ward discussing the patients care, assessment, plan and disposition with other care givers. >50% of the time was devoted counseling the patient about the risks and benefits of treatment/Discharge disposition and coordinating care.   SIGNED:   Damita Lack, MD  [facility] 02/07/2019, 12:11 PM   If 7PM-7AM, please contact night-coverage [email or web address]

## 2019-02-08 LAB — CULTURE, BLOOD (ROUTINE X 2)
Culture: NO GROWTH
Special Requests: ADEQUATE

## 2019-02-09 DIAGNOSIS — Z7901 Long term (current) use of anticoagulants: Secondary | ICD-10-CM | POA: Diagnosis not present

## 2019-02-09 DIAGNOSIS — M1990 Unspecified osteoarthritis, unspecified site: Secondary | ICD-10-CM | POA: Diagnosis not present

## 2019-02-09 DIAGNOSIS — A419 Sepsis, unspecified organism: Secondary | ICD-10-CM | POA: Diagnosis not present

## 2019-02-09 DIAGNOSIS — J189 Pneumonia, unspecified organism: Secondary | ICD-10-CM | POA: Diagnosis not present

## 2019-02-09 DIAGNOSIS — I129 Hypertensive chronic kidney disease with stage 1 through stage 4 chronic kidney disease, or unspecified chronic kidney disease: Secondary | ICD-10-CM | POA: Diagnosis not present

## 2019-02-09 DIAGNOSIS — N183 Chronic kidney disease, stage 3 unspecified: Secondary | ICD-10-CM | POA: Diagnosis not present

## 2019-02-09 DIAGNOSIS — M791 Myalgia, unspecified site: Secondary | ICD-10-CM | POA: Diagnosis not present

## 2019-02-09 DIAGNOSIS — K219 Gastro-esophageal reflux disease without esophagitis: Secondary | ICD-10-CM | POA: Diagnosis not present

## 2019-02-09 DIAGNOSIS — E785 Hyperlipidemia, unspecified: Secondary | ICD-10-CM | POA: Diagnosis not present

## 2019-02-09 DIAGNOSIS — J449 Chronic obstructive pulmonary disease, unspecified: Secondary | ICD-10-CM | POA: Diagnosis not present

## 2019-02-09 DIAGNOSIS — I428 Other cardiomyopathies: Secondary | ICD-10-CM | POA: Diagnosis not present

## 2019-02-09 DIAGNOSIS — I480 Paroxysmal atrial fibrillation: Secondary | ICD-10-CM | POA: Diagnosis not present

## 2019-02-10 DIAGNOSIS — A419 Sepsis, unspecified organism: Secondary | ICD-10-CM | POA: Diagnosis not present

## 2019-02-10 DIAGNOSIS — J189 Pneumonia, unspecified organism: Secondary | ICD-10-CM | POA: Diagnosis not present

## 2019-02-10 DIAGNOSIS — I428 Other cardiomyopathies: Secondary | ICD-10-CM | POA: Diagnosis not present

## 2019-02-10 DIAGNOSIS — I480 Paroxysmal atrial fibrillation: Secondary | ICD-10-CM | POA: Diagnosis not present

## 2019-02-10 DIAGNOSIS — I129 Hypertensive chronic kidney disease with stage 1 through stage 4 chronic kidney disease, or unspecified chronic kidney disease: Secondary | ICD-10-CM | POA: Diagnosis not present

## 2019-02-10 DIAGNOSIS — N183 Chronic kidney disease, stage 3 unspecified: Secondary | ICD-10-CM | POA: Diagnosis not present

## 2019-02-15 ENCOUNTER — Encounter: Payer: Self-pay | Admitting: Physician Assistant

## 2019-02-15 DIAGNOSIS — R197 Diarrhea, unspecified: Secondary | ICD-10-CM | POA: Diagnosis not present

## 2019-02-15 DIAGNOSIS — R1310 Dysphagia, unspecified: Secondary | ICD-10-CM | POA: Diagnosis not present

## 2019-02-15 DIAGNOSIS — J690 Pneumonitis due to inhalation of food and vomit: Secondary | ICD-10-CM | POA: Diagnosis not present

## 2019-02-15 DIAGNOSIS — N179 Acute kidney failure, unspecified: Secondary | ICD-10-CM | POA: Diagnosis not present

## 2019-02-15 DIAGNOSIS — J189 Pneumonia, unspecified organism: Secondary | ICD-10-CM | POA: Diagnosis not present

## 2019-02-15 DIAGNOSIS — J9601 Acute respiratory failure with hypoxia: Secondary | ICD-10-CM | POA: Diagnosis not present

## 2019-02-15 DIAGNOSIS — A419 Sepsis, unspecified organism: Secondary | ICD-10-CM | POA: Diagnosis not present

## 2019-02-16 DIAGNOSIS — I428 Other cardiomyopathies: Secondary | ICD-10-CM | POA: Diagnosis not present

## 2019-02-16 DIAGNOSIS — N183 Chronic kidney disease, stage 3 unspecified: Secondary | ICD-10-CM | POA: Diagnosis not present

## 2019-02-16 DIAGNOSIS — I129 Hypertensive chronic kidney disease with stage 1 through stage 4 chronic kidney disease, or unspecified chronic kidney disease: Secondary | ICD-10-CM | POA: Diagnosis not present

## 2019-02-16 DIAGNOSIS — A419 Sepsis, unspecified organism: Secondary | ICD-10-CM | POA: Diagnosis not present

## 2019-02-16 DIAGNOSIS — I480 Paroxysmal atrial fibrillation: Secondary | ICD-10-CM | POA: Diagnosis not present

## 2019-02-16 DIAGNOSIS — J189 Pneumonia, unspecified organism: Secondary | ICD-10-CM | POA: Diagnosis not present

## 2019-02-17 DIAGNOSIS — J189 Pneumonia, unspecified organism: Secondary | ICD-10-CM | POA: Diagnosis not present

## 2019-02-17 DIAGNOSIS — I480 Paroxysmal atrial fibrillation: Secondary | ICD-10-CM | POA: Diagnosis not present

## 2019-02-17 DIAGNOSIS — N183 Chronic kidney disease, stage 3 unspecified: Secondary | ICD-10-CM | POA: Diagnosis not present

## 2019-02-17 DIAGNOSIS — I129 Hypertensive chronic kidney disease with stage 1 through stage 4 chronic kidney disease, or unspecified chronic kidney disease: Secondary | ICD-10-CM | POA: Diagnosis not present

## 2019-02-17 DIAGNOSIS — I428 Other cardiomyopathies: Secondary | ICD-10-CM | POA: Diagnosis not present

## 2019-02-17 DIAGNOSIS — A419 Sepsis, unspecified organism: Secondary | ICD-10-CM | POA: Diagnosis not present

## 2019-02-22 DIAGNOSIS — J189 Pneumonia, unspecified organism: Secondary | ICD-10-CM | POA: Diagnosis not present

## 2019-02-22 DIAGNOSIS — N183 Chronic kidney disease, stage 3 unspecified: Secondary | ICD-10-CM | POA: Diagnosis not present

## 2019-02-22 DIAGNOSIS — I480 Paroxysmal atrial fibrillation: Secondary | ICD-10-CM | POA: Diagnosis not present

## 2019-02-22 DIAGNOSIS — A419 Sepsis, unspecified organism: Secondary | ICD-10-CM | POA: Diagnosis not present

## 2019-02-22 DIAGNOSIS — I428 Other cardiomyopathies: Secondary | ICD-10-CM | POA: Diagnosis not present

## 2019-02-22 DIAGNOSIS — I129 Hypertensive chronic kidney disease with stage 1 through stage 4 chronic kidney disease, or unspecified chronic kidney disease: Secondary | ICD-10-CM | POA: Diagnosis not present

## 2019-02-23 ENCOUNTER — Ambulatory Visit: Payer: Medicare Other | Admitting: Physician Assistant

## 2019-02-23 DIAGNOSIS — A419 Sepsis, unspecified organism: Secondary | ICD-10-CM | POA: Diagnosis not present

## 2019-02-23 DIAGNOSIS — N183 Chronic kidney disease, stage 3 unspecified: Secondary | ICD-10-CM | POA: Diagnosis not present

## 2019-02-23 DIAGNOSIS — I428 Other cardiomyopathies: Secondary | ICD-10-CM | POA: Diagnosis not present

## 2019-02-23 DIAGNOSIS — I480 Paroxysmal atrial fibrillation: Secondary | ICD-10-CM | POA: Diagnosis not present

## 2019-02-23 DIAGNOSIS — I129 Hypertensive chronic kidney disease with stage 1 through stage 4 chronic kidney disease, or unspecified chronic kidney disease: Secondary | ICD-10-CM | POA: Diagnosis not present

## 2019-02-23 DIAGNOSIS — J189 Pneumonia, unspecified organism: Secondary | ICD-10-CM | POA: Diagnosis not present

## 2019-02-28 DIAGNOSIS — D3131 Benign neoplasm of right choroid: Secondary | ICD-10-CM | POA: Diagnosis not present

## 2019-02-28 DIAGNOSIS — H35363 Drusen (degenerative) of macula, bilateral: Secondary | ICD-10-CM | POA: Diagnosis not present

## 2019-02-28 DIAGNOSIS — H35453 Secondary pigmentary degeneration, bilateral: Secondary | ICD-10-CM | POA: Diagnosis not present

## 2019-02-28 DIAGNOSIS — H353121 Nonexudative age-related macular degeneration, left eye, early dry stage: Secondary | ICD-10-CM | POA: Diagnosis not present

## 2019-02-28 DIAGNOSIS — Z961 Presence of intraocular lens: Secondary | ICD-10-CM | POA: Diagnosis not present

## 2019-02-28 DIAGNOSIS — H35723 Serous detachment of retinal pigment epithelium, bilateral: Secondary | ICD-10-CM | POA: Diagnosis not present

## 2019-03-01 DIAGNOSIS — M810 Age-related osteoporosis without current pathological fracture: Secondary | ICD-10-CM | POA: Diagnosis not present

## 2019-03-01 DIAGNOSIS — N184 Chronic kidney disease, stage 4 (severe): Secondary | ICD-10-CM | POA: Diagnosis not present

## 2019-03-04 DIAGNOSIS — N183 Chronic kidney disease, stage 3 unspecified: Secondary | ICD-10-CM | POA: Diagnosis not present

## 2019-03-04 DIAGNOSIS — J189 Pneumonia, unspecified organism: Secondary | ICD-10-CM | POA: Diagnosis not present

## 2019-03-04 DIAGNOSIS — I129 Hypertensive chronic kidney disease with stage 1 through stage 4 chronic kidney disease, or unspecified chronic kidney disease: Secondary | ICD-10-CM | POA: Diagnosis not present

## 2019-03-04 DIAGNOSIS — A419 Sepsis, unspecified organism: Secondary | ICD-10-CM | POA: Diagnosis not present

## 2019-03-04 DIAGNOSIS — I428 Other cardiomyopathies: Secondary | ICD-10-CM | POA: Diagnosis not present

## 2019-03-04 DIAGNOSIS — I480 Paroxysmal atrial fibrillation: Secondary | ICD-10-CM | POA: Diagnosis not present

## 2019-03-10 ENCOUNTER — Encounter (HOSPITAL_COMMUNITY): Payer: Medicare Other

## 2019-03-17 ENCOUNTER — Other Ambulatory Visit: Payer: Self-pay

## 2019-03-28 ENCOUNTER — Other Ambulatory Visit: Payer: Self-pay | Admitting: *Deleted

## 2019-03-28 NOTE — Patient Outreach (Signed)
[place] [facility]) Care Management  03/28/2019  Judith Boone 04-17-1932 [id number]   Telephone Screen  Referral Date:  03/16/2019 Referral Source:  EMMI Prevent Reason for Referral:  Screening Insurance:  Medicare   Outreach Attempt:  Successful telephone outreach to patient for telephone screening.  HIPAA verified with patient.  [facility] services reviewed and discussed.  Patient declining screening and services at this time.  States she feels her conditions are well managed and she understands her diagnoses.  Agreeable to [facility] pamphlet being mailed to home.  Encouraged patient to contact [facility] in the future if she feels needs arise.  Plan:  RN Health Coach will send Successful Letter with [facility] Pamphlet.  RN Health Coach will close case and make patient inactive with [facility] as patient declines services at this time.   [place] [phone] Read [email or web address]

## 2019-03-29 DIAGNOSIS — R197 Diarrhea, unspecified: Secondary | ICD-10-CM | POA: Diagnosis not present

## 2019-03-29 DIAGNOSIS — R34 Anuria and oliguria: Secondary | ICD-10-CM | POA: Diagnosis not present

## 2019-03-29 DIAGNOSIS — J189 Pneumonia, unspecified organism: Secondary | ICD-10-CM | POA: Diagnosis not present

## 2019-03-29 DIAGNOSIS — N184 Chronic kidney disease, stage 4 (severe): Secondary | ICD-10-CM | POA: Diagnosis not present

## 2019-03-29 DIAGNOSIS — R1310 Dysphagia, unspecified: Secondary | ICD-10-CM | POA: Diagnosis not present

## 2019-03-29 DIAGNOSIS — J449 Chronic obstructive pulmonary disease, unspecified: Secondary | ICD-10-CM | POA: Diagnosis not present

## 2019-03-29 DIAGNOSIS — J690 Pneumonitis due to inhalation of food and vomit: Secondary | ICD-10-CM | POA: Diagnosis not present

## 2019-03-29 DIAGNOSIS — J9601 Acute respiratory failure with hypoxia: Secondary | ICD-10-CM | POA: Diagnosis not present

## 2019-03-29 DIAGNOSIS — I129 Hypertensive chronic kidney disease with stage 1 through stage 4 chronic kidney disease, or unspecified chronic kidney disease: Secondary | ICD-10-CM | POA: Diagnosis not present

## 2019-03-31 ENCOUNTER — Ambulatory Visit (HOSPITAL_COMMUNITY)
Admission: RE | Admit: 2019-03-31 | Discharge: 2019-03-31 | Disposition: A | Discharge status: Home / Self Care | Payer: Medicare Other | Source: Ambulatory Visit | Attending: Physician Assistant | Admitting: Physician Assistant

## 2019-03-31 ENCOUNTER — Encounter (HOSPITAL_COMMUNITY): Payer: Self-pay | Admitting: Physician Assistant

## 2019-03-31 ENCOUNTER — Other Ambulatory Visit: Payer: Self-pay

## 2019-03-31 VITALS — BP 132/56 | HR 73 | Ht 60.0 in | Wt 138.6 lb

## 2019-03-31 DIAGNOSIS — D6869 Other thrombophilia: Secondary | ICD-10-CM

## 2019-03-31 DIAGNOSIS — Z8249 Family history of ischemic heart disease and other diseases of the circulatory system: Secondary | ICD-10-CM | POA: Insufficient documentation

## 2019-03-31 DIAGNOSIS — Z7901 Long term (current) use of anticoagulants: Secondary | ICD-10-CM | POA: Insufficient documentation

## 2019-03-31 DIAGNOSIS — R002 Palpitations: Secondary | ICD-10-CM

## 2019-03-31 DIAGNOSIS — Z87891 Personal history of nicotine dependence: Secondary | ICD-10-CM | POA: Diagnosis not present

## 2019-03-31 DIAGNOSIS — Z79899 Other long term (current) drug therapy: Secondary | ICD-10-CM | POA: Diagnosis not present

## 2019-03-31 DIAGNOSIS — I10 Essential (primary) hypertension: Secondary | ICD-10-CM | POA: Diagnosis not present

## 2019-03-31 DIAGNOSIS — K219 Gastro-esophageal reflux disease without esophagitis: Secondary | ICD-10-CM | POA: Insufficient documentation

## 2019-03-31 DIAGNOSIS — I480 Paroxysmal atrial fibrillation: Secondary | ICD-10-CM | POA: Diagnosis not present

## 2019-03-31 DIAGNOSIS — J449 Chronic obstructive pulmonary disease, unspecified: Secondary | ICD-10-CM | POA: Insufficient documentation

## 2019-03-31 DIAGNOSIS — I440 Atrioventricular block, first degree: Secondary | ICD-10-CM | POA: Diagnosis not present

## 2019-03-31 DIAGNOSIS — E785 Hyperlipidemia, unspecified: Secondary | ICD-10-CM | POA: Insufficient documentation

## 2019-03-31 DIAGNOSIS — I739 Peripheral vascular disease, unspecified: Secondary | ICD-10-CM | POA: Diagnosis not present

## 2019-03-31 NOTE — Progress Notes (Addendum)
Primary Care Physician: Velna Hatchet, MD Primary Cardiologist: none Primary Electrophysiologist: none Referring Physician: [facility]   Judith Boone is a 85 y.o. female with a history of COPD, HTN, HLD, PVD and paroxysmal atrial fibrillation who presents for follow up in the [facility].  The patient was initially diagnosed with atrial fibrillation 07/17/18 after presenting to the ER with elevated heart rates on her Apple Watch. She was asymptomatic during the event. She was started on Eliquis for a CHADS2VASC score 5 and metoprolol.   On follow up today, patient was hospitalized in 01/2019 with sepsis from PNA. Fortunately, she has maintained SR. She does report that she has an elevated heart rate in the evenings with rates close to 110. She denies any symptoms.   Today, she denies symptoms of palpitations, chest pain, shortness of breath, orthopnea, PND, lower extremity edema, dizziness, presyncope, syncope, snoring, daytime somnolence, bleeding, or neurologic sequela. The patient is tolerating medications without difficulties and is otherwise without complaint today.    Atrial Fibrillation Risk Factors:  she does not have symptoms or diagnosis of sleep apnea. she does not have a history of rheumatic fever. she does not have a history of alcohol use. The patient does not have a history of early familial atrial fibrillation or other arrhythmias.  she has a BMI of Body mass index is 27.07 kg/m.Marland Kitchen Filed Weights   03/31/19 1416  Weight: 62.9 kg    Family History  Problem Relation Age of Onset  . Alcohol abuse Father   . Heart disease Father   . Hypertension Father   . Hypertension Daughter   . Hypertension Son      Atrial Fibrillation Management history:  Previous antiarrhythmic drugs: none Previous cardioversions: none Previous ablations: none CHADS2VASC score: 5 Anticoagulation history: Eliquis   Past Medical History:  Diagnosis Date   . Allergy    allergic rhinitis  . Arthritis   . Cellulitis of left leg 07-2014   hospitalized for 4 days  . COPD (chronic obstructive pulmonary disease) ([place])   . GERD (gastroesophageal reflux disease)   . History of granulomatous disease   . Hyperlipidemia   . Hypertension   . Left leg swelling   . Right-sided low back pain with sciatica    Past Surgical History:  Procedure Laterality Date  . ABDOMINAL HYSTERECTOMY    . broken ankle repair     left  . CATARACT EXTRACTION    . TOTAL KNEE ARTHROPLASTY     right    Current Outpatient Medications  Medication Sig Dispense Refill  . amLODipine (NORVASC) 5 MG tablet Take 5 mg by mouth at bedtime.   5  . ANORO ELLIPTA 62.5-25 MCG/INH AEPB Inhale 1 puff into the lungs daily.    Marland Kitchen apixaban (ELIQUIS) 2.5 MG TABS tablet Take 1 tablet (2.5 mg total) by mouth 2 (two) times daily for 30 days. 180 tablet 2  . atorvastatin (LIPITOR) 20 MG tablet Take 20 mg by mouth at bedtime.     . cetirizine (ZYRTEC) 10 MG tablet Take 10 mg by mouth daily.    . famotidine (PEPCID) 20 MG tablet Take 20 mg by mouth at bedtime.     . hydrALAZINE (APRESOLINE) 25 MG tablet Take 25 mg by mouth 2 (two) times daily.     . metoprolol succinate (TOPROL-XL) 25 MG 24 hr tablet Take 1 tablet (25 mg total) by mouth daily. (Patient taking differently: Take 25 mg by mouth at  bedtime. ) 90 tablet 2  . omeprazole (PRILOSEC) 40 MG capsule Take 40 mg by mouth daily.     . pramipexole (MIRAPEX) 0.125 MG tablet Take 0.125 mg by mouth at bedtime.  3  . quinapril (ACCUPRIL) 40 MG tablet Take 40 mg by mouth daily.     . VENTOLIN HFA 108 (90 BASE) MCG/ACT inhaler Inhale 1-2 puffs into the lungs every 6 (six) hours as needed for wheezing.   3   No current facility-administered medications for this encounter.     Allergies  Allergen Reactions  . Bacitracin     unknown  . Neosporin [Neomycin-Bacitracin Zn-Polymyx]     unknown  . Polysporin [Bacitracin-Polymyxin B]     unknown     Social History   Socioeconomic History  . Marital status: Widowed    Spouse name: Not on file  . Number of children: Not on file  . Years of education: Not on file  . Highest education level: Not on file  Occupational History  . Not on file  Social Needs  . Financial resource strain: Not on file  . Food insecurity    Worry: Not on file    Inability: Not on file  . Transportation needs    Medical: Not on file    Non-medical: Not on file  Tobacco Use  . Smoking status: Former Smoker    Quit date: 04/29/1979    Years since quitting: 39.9  . Smokeless tobacco: Never Used  Substance and Sexual Activity  . Alcohol use: Yes    Alcohol/week: 2.0 - 3.0 standard drinks    Types: 2 - 3 Glasses of wine per week    Comment: weekly  . Drug use: No  . Sexual activity: Not on file  Lifestyle  . Physical activity    Days per week: Not on file    Minutes per session: Not on file  . Stress: Not on file  Relationships  . Social Herbalist on phone: Not on file    Gets together: Not on file    Attends religious service: Not on file    Active member of club or organization: Not on file    Attends meetings of clubs or organizations: Not on file    Relationship status: Not on file  . Intimate partner violence    Fear of current or ex partner: Not on file    Emotionally abused: Not on file    Physically abused: Not on file    Forced sexual activity: Not on file  Other Topics Concern  . Not on file  Social History Narrative  . Not on file     ROS- All systems are reviewed and negative except as per the HPI above.  Physical Exam: Vitals:   03/31/19 1416  BP: (!) 132/56  Pulse: 73  SpO2: 96%  Weight: 62.9 kg  Height: 5' (1.524 m)    GEN- The patient is well appearing elderly female, alert and oriented x 3 today.   HEENT-head normocephalic, atraumatic, sclera clear, conjunctiva pink, hearing intact, trachea midline. Lungs- Clear to ausculation bilaterally, normal  work of breathing Heart- Regular rate and rhythm, no murmurs, rubs or gallops  GI- soft, NT, ND, + BS Extremities- no clubbing, cyanosis, or edema MS- no significant deformity or atrophy Skin- no rash or lesion Psych- euthymic mood, full affect Neuro- strength and sensation are intact   Wt Readings from Last 3 Encounters:  03/31/19 62.9 kg  02/03/19 68.3  kg  11/24/18 66.7 kg    EKG today demonstrates SR HR 73, 1st degree AV block, PR 226, QRS 68, QTc 438  Echo 10/12/18 demonstrated   1. The left ventricle has normal systolic function with an ejection fraction of 60-65%. The cavity size was normal. Left ventricular diastolic Doppler parameters are consistent with impaired relaxation.  2. The mitral valve is grossly normal.  3. The tricuspid valve is grossly normal.  4. The aortic valve was not well visualized. Aortic valve regurgitation is trivial by color flow Doppler. No stenosis of the aortic valve.  5. Normal LV function; mild diastolic dysfunction; trace AI; mild TR; mildly elevated pulmonary pressure.  Epic records are reviewed at length today  Assessment and Plan:  1. Paroxysmal atrial fibrillation Patient appears to be maintaining SR but does have elevated heart rates in the evening. Will place 3 day Zio patch to evaluated arrhythmia. Continue Eliquis 2.5 mg BID. Will request recent labs from PCP. If Cr <1.5 will increase to 5 mg BID. Continue metoprolol 25 mg daily  This patients CHA2DS2-VASc Score and unadjusted Ischemic Stroke Rate (% per year) is equal to 7.2 % stroke rate/year from a score of 5  Above score calculated as 1 point each if present [CHF, HTN, DM, Vascular=MI/PAD/Aortic Plaque, Age if 65-74, or Female] Above score calculated as 2 points each if present [Age > 75, or Stroke/TIA/TE]   2. HTN Stable, no changes today.   Follow up in the AF clinic in 4 months.   Addendum: Labs from PCP 03/29/19 show Cr 1.5, Hgb 10.7. Keep current dose of Eliquis.    [facility] [address] [place], [place] [address] [phone] 03/31/2019 4:54 PM

## 2019-04-04 NOTE — Addendum Note (Signed)
Encounter addended by: Oliver Barre, PA on: 04/04/2019 9:43 AM  Actions taken: Clinical Note Signed

## 2019-04-08 ENCOUNTER — Other Ambulatory Visit: Payer: Self-pay | Admitting: Internal Medicine

## 2019-04-08 DIAGNOSIS — K229 Disease of esophagus, unspecified: Secondary | ICD-10-CM

## 2019-04-08 DIAGNOSIS — Q183 Webbing of neck: Secondary | ICD-10-CM | POA: Diagnosis not present

## 2019-04-08 DIAGNOSIS — K219 Gastro-esophageal reflux disease without esophagitis: Secondary | ICD-10-CM | POA: Diagnosis not present

## 2019-04-08 DIAGNOSIS — R1310 Dysphagia, unspecified: Secondary | ICD-10-CM | POA: Diagnosis not present

## 2019-04-19 ENCOUNTER — Other Ambulatory Visit: Payer: Self-pay | Admitting: Internal Medicine

## 2019-04-19 ENCOUNTER — Ambulatory Visit
Admission: RE | Admit: 2019-04-19 | Discharge: 2019-04-19 | Disposition: A | Discharge status: Home / Self Care | Payer: Medicare Other | Source: Ambulatory Visit | Attending: Internal Medicine | Admitting: Internal Medicine

## 2019-04-19 DIAGNOSIS — R1310 Dysphagia, unspecified: Secondary | ICD-10-CM

## 2019-04-19 DIAGNOSIS — K229 Disease of esophagus, unspecified: Secondary | ICD-10-CM

## 2019-05-18 DIAGNOSIS — M4316 Spondylolisthesis, lumbar region: Secondary | ICD-10-CM | POA: Diagnosis not present

## 2019-05-18 DIAGNOSIS — M4126 Other idiopathic scoliosis, lumbar region: Secondary | ICD-10-CM | POA: Diagnosis not present

## 2019-05-18 DIAGNOSIS — M47816 Spondylosis without myelopathy or radiculopathy, lumbar region: Secondary | ICD-10-CM | POA: Diagnosis not present

## 2019-05-18 DIAGNOSIS — M5127 Other intervertebral disc displacement, lumbosacral region: Secondary | ICD-10-CM | POA: Diagnosis not present

## 2019-05-25 DIAGNOSIS — I10 Essential (primary) hypertension: Secondary | ICD-10-CM | POA: Diagnosis not present

## 2019-05-25 DIAGNOSIS — M4126 Other idiopathic scoliosis, lumbar region: Secondary | ICD-10-CM | POA: Diagnosis not present

## 2019-05-25 DIAGNOSIS — Z6827 Body mass index (BMI) 27.0-27.9, adult: Secondary | ICD-10-CM | POA: Diagnosis not present

## 2019-05-25 DIAGNOSIS — M47816 Spondylosis without myelopathy or radiculopathy, lumbar region: Secondary | ICD-10-CM | POA: Diagnosis not present

## 2019-05-25 DIAGNOSIS — M48061 Spinal stenosis, lumbar region without neurogenic claudication: Secondary | ICD-10-CM | POA: Diagnosis not present

## 2019-05-26 ENCOUNTER — Encounter (HOSPITAL_COMMUNITY): Payer: Self-pay | Admitting: *Deleted

## 2019-05-26 NOTE — Progress Notes (Signed)
error 

## 2019-06-06 DIAGNOSIS — H35363 Drusen (degenerative) of macula, bilateral: Secondary | ICD-10-CM | POA: Diagnosis not present

## 2019-06-06 DIAGNOSIS — H3562 Retinal hemorrhage, left eye: Secondary | ICD-10-CM | POA: Diagnosis not present

## 2019-06-06 DIAGNOSIS — H353122 Nonexudative age-related macular degeneration, left eye, intermediate dry stage: Secondary | ICD-10-CM | POA: Diagnosis not present

## 2019-06-06 DIAGNOSIS — Z961 Presence of intraocular lens: Secondary | ICD-10-CM | POA: Diagnosis not present

## 2019-06-09 DIAGNOSIS — M47816 Spondylosis without myelopathy or radiculopathy, lumbar region: Secondary | ICD-10-CM | POA: Diagnosis not present

## 2019-06-24 DIAGNOSIS — M48061 Spinal stenosis, lumbar region without neurogenic claudication: Secondary | ICD-10-CM | POA: Diagnosis not present

## 2019-06-24 DIAGNOSIS — M47816 Spondylosis without myelopathy or radiculopathy, lumbar region: Secondary | ICD-10-CM | POA: Diagnosis not present

## 2019-07-06 DIAGNOSIS — K219 Gastro-esophageal reflux disease without esophagitis: Secondary | ICD-10-CM | POA: Diagnosis not present

## 2019-07-06 DIAGNOSIS — K449 Diaphragmatic hernia without obstruction or gangrene: Secondary | ICD-10-CM | POA: Diagnosis not present

## 2019-07-06 DIAGNOSIS — R1319 Other dysphagia: Secondary | ICD-10-CM | POA: Diagnosis not present

## 2019-07-12 ENCOUNTER — Other Ambulatory Visit (HOSPITAL_COMMUNITY): Payer: Self-pay | Admitting: Physician Assistant

## 2019-07-13 DIAGNOSIS — J449 Chronic obstructive pulmonary disease, unspecified: Secondary | ICD-10-CM | POA: Diagnosis not present

## 2019-07-13 DIAGNOSIS — J019 Acute sinusitis, unspecified: Secondary | ICD-10-CM | POA: Diagnosis not present

## 2019-07-13 DIAGNOSIS — Z9981 Dependence on supplemental oxygen: Secondary | ICD-10-CM | POA: Diagnosis not present

## 2019-07-13 DIAGNOSIS — Z1152 Encounter for screening for COVID-19: Secondary | ICD-10-CM | POA: Diagnosis not present

## 2019-07-13 DIAGNOSIS — J441 Chronic obstructive pulmonary disease with (acute) exacerbation: Secondary | ICD-10-CM | POA: Diagnosis not present

## 2019-07-13 DIAGNOSIS — R05 Cough: Secondary | ICD-10-CM | POA: Diagnosis not present

## 2019-08-01 ENCOUNTER — Encounter (HOSPITAL_COMMUNITY): Payer: Self-pay | Admitting: Physician Assistant

## 2019-08-01 ENCOUNTER — Other Ambulatory Visit: Payer: Self-pay

## 2019-08-01 ENCOUNTER — Ambulatory Visit (HOSPITAL_COMMUNITY)
Admission: RE | Admit: 2019-08-01 | Discharge: 2019-08-01 | Disposition: A | Discharge status: Home / Self Care | Payer: Medicare Other | Source: Ambulatory Visit | Attending: Physician Assistant | Admitting: Physician Assistant

## 2019-08-01 VITALS — BP 142/62 | HR 83 | Ht 60.0 in | Wt 141.2 lb

## 2019-08-01 DIAGNOSIS — K219 Gastro-esophageal reflux disease without esophagitis: Secondary | ICD-10-CM | POA: Insufficient documentation

## 2019-08-01 DIAGNOSIS — I739 Peripheral vascular disease, unspecified: Secondary | ICD-10-CM | POA: Diagnosis not present

## 2019-08-01 DIAGNOSIS — J449 Chronic obstructive pulmonary disease, unspecified: Secondary | ICD-10-CM | POA: Insufficient documentation

## 2019-08-01 DIAGNOSIS — Z7901 Long term (current) use of anticoagulants: Secondary | ICD-10-CM | POA: Insufficient documentation

## 2019-08-01 DIAGNOSIS — I480 Paroxysmal atrial fibrillation: Secondary | ICD-10-CM

## 2019-08-01 DIAGNOSIS — I10 Essential (primary) hypertension: Secondary | ICD-10-CM | POA: Diagnosis not present

## 2019-08-01 DIAGNOSIS — Z87891 Personal history of nicotine dependence: Secondary | ICD-10-CM | POA: Diagnosis not present

## 2019-08-01 DIAGNOSIS — Z79899 Other long term (current) drug therapy: Secondary | ICD-10-CM | POA: Diagnosis not present

## 2019-08-01 DIAGNOSIS — E785 Hyperlipidemia, unspecified: Secondary | ICD-10-CM | POA: Insufficient documentation

## 2019-08-01 DIAGNOSIS — D6869 Other thrombophilia: Secondary | ICD-10-CM

## 2019-08-01 NOTE — Progress Notes (Signed)
Primary Care Physician: Velna Hatchet, MD Primary Cardiologist: none Primary Electrophysiologist: none Referring Physician: [facility]   Judith Boone is a 86 y.o. female with a history of COPD, HTN, HLD, PVD and paroxysmal atrial fibrillation who presents for follow up in the [facility].  The patient was initially diagnosed with atrial fibrillation 07/17/18 after presenting to the ER with elevated heart rates on her Apple Watch. She was asymptomatic during the event. She was started on Eliquis for a CHADS2VASC score 5 and metoprolol.   On follow up today, patient reports that she has done well since her last visit. She did have an URI about one month ago which has resolved. Zio patch showed no afib with brief epidodes of atrial tach and occasional PACs and PVCs. She denies bleeding issues with anticoagulation.   Today, she denies symptoms of palpitations, chest pain, shortness of breath, orthopnea, PND, lower extremity edema, dizziness, presyncope, syncope, snoring, daytime somnolence, bleeding, or neurologic sequela. The patient is tolerating medications without difficulties and is otherwise without complaint today.    Atrial Fibrillation Risk Factors:  she does not have symptoms or diagnosis of sleep apnea. she does not have a history of rheumatic fever. she does not have a history of alcohol use. The patient does not have a history of early familial atrial fibrillation or other arrhythmias.  she has a BMI of Body mass index is 27.58 kg/m.Marland Kitchen Filed Weights   08/01/19 1402  Weight: 64 kg    Family History  Problem Relation Age of Onset  . Alcohol abuse Father   . Heart disease Father   . Hypertension Father   . Hypertension Daughter   . Hypertension Son      Atrial Fibrillation Management history:  Previous antiarrhythmic drugs: none Previous cardioversions: none Previous ablations: none CHADS2VASC score: 5 Anticoagulation history:  Eliquis   Past Medical History:  Diagnosis Date  . Allergy    allergic rhinitis  . Arthritis   . Cellulitis of left leg 07-2014   hospitalized for 4 days  . COPD (chronic obstructive pulmonary disease) ([place])   . GERD (gastroesophageal reflux disease)   . History of granulomatous disease   . Hyperlipidemia   . Hypertension   . Left leg swelling   . Right-sided low back pain with sciatica    Past Surgical History:  Procedure Laterality Date  . ABDOMINAL HYSTERECTOMY    . broken ankle repair     left  . CATARACT EXTRACTION    . TOTAL KNEE ARTHROPLASTY     right    Current Outpatient Medications  Medication Sig Dispense Refill  . amLODipine (NORVASC) 5 MG tablet Take 5 mg by mouth at bedtime.   5  . ANORO ELLIPTA 62.5-25 MCG/INH AEPB Inhale 1 puff into the lungs daily.    Marland Kitchen ascorbic acid (VITAMIN C) 500 MG tablet Take by mouth.    Marland Kitchen atorvastatin (LIPITOR) 20 MG tablet Take 20 mg by mouth at bedtime.     . cetirizine (ZYRTEC) 10 MG tablet Take 10 mg by mouth daily.    Marland Kitchen ELIQUIS 2.5 MG TABS tablet TAKE 1 TABLET (2.5 MG TOTAL) BY MOUTH 2 (TWO) TIMES DAILY 180 tablet 2  . famotidine (PEPCID) 20 MG tablet Take 20 mg by mouth at bedtime.     . fluticasone (FLONASE) 50 MCG/ACT nasal spray Place 2 sprays into both nostrils daily.    . hydrALAZINE (APRESOLINE) 25 MG tablet Take 25 mg by  mouth 2 (two) times daily.     . metoprolol succinate (TOPROL-XL) 25 MG 24 hr tablet Take 1 tablet (25 mg total) by mouth daily. 90 tablet 2  . omeprazole (PRILOSEC) 40 MG capsule Take 40 mg by mouth daily.     . pramipexole (MIRAPEX) 0.125 MG tablet Take 0.125 mg by mouth at bedtime.  3  . quinapril (ACCUPRIL) 40 MG tablet Take 40 mg by mouth daily.     . VENTOLIN HFA 108 (90 BASE) MCG/ACT inhaler Inhale 1-2 puffs into the lungs every 6 (six) hours as needed for wheezing.   3   No current facility-administered medications for this encounter.    Allergies  Allergen Reactions  . Bacitracin      unknown  . Neosporin [Neomycin-Bacitracin Zn-Polymyx]     unknown  . Polysporin [Bacitracin-Polymyxin B]     unknown    Social History   Socioeconomic History  . Marital status: Widowed    Spouse name: Not on file  . Number of children: Not on file  . Years of education: Not on file  . Highest education level: Not on file  Occupational History  . Not on file  Tobacco Use  . Smoking status: Former Smoker    Quit date: 04/29/1979    Years since quitting: 40.2  . Smokeless tobacco: Never Used  Substance and Sexual Activity  . Alcohol use: Yes    Alcohol/week: 2.0 - 3.0 standard drinks    Types: 2 - 3 Glasses of wine per week    Comment: weekly  . Drug use: No  . Sexual activity: Not on file  Other Topics Concern  . Not on file  Social History Narrative  . Not on file   Social Determinants of Health   Financial Resource Strain:   . Difficulty of Paying Living Expenses:   Food Insecurity:   . Worried About Charity fundraiser in the Last Year:   . Arboriculturist in the Last Year:   Transportation Needs:   . Film/video editor (Medical):   Marland Kitchen Lack of Transportation (Non-Medical):   Physical Activity:   . Days of Exercise per Week:   . Minutes of Exercise per Session:   Stress:   . Feeling of Stress :   Social Connections:   . Frequency of Communication with Friends and Family:   . Frequency of Social Gatherings with Friends and Family:   . Attends Religious Services:   . Active Member of Clubs or Organizations:   . Attends Archivist Meetings:   Marland Kitchen Marital Status:   Intimate Partner Violence:   . Fear of Current or Ex-Partner:   . Emotionally Abused:   Marland Kitchen Physically Abused:   . Sexually Abused:      ROS- All systems are reviewed and negative except as per the HPI above.  Physical Exam: Vitals:   08/01/19 1402  BP: (!) 142/62  Pulse: 83  Weight: 64 kg  Height: 5' (1.524 m)    GEN- The patient is well appearing elderly female, alert and  oriented x 3 today.   HEENT-head normocephalic, atraumatic, sclera clear, conjunctiva pink, hearing intact, trachea midline. Lungs- Clear to ausculation bilaterally, normal work of breathing Heart- Regular rate and rhythm, no murmurs, rubs or gallops  GI- soft, NT, ND, + BS Extremities- no clubbing, cyanosis, or edema MS- no significant deformity or atrophy Skin- no rash or lesion Psych- euthymic mood, full affect Neuro- strength and sensation are intact  Wt Readings from Last 3 Encounters:  08/01/19 64 kg  03/31/19 62.9 kg  02/03/19 68.3 kg    EKG today demonstrates SR HR 83, 1st degree AV block, PR 212, QRS 68, QTc 432  Echo 10/12/18 demonstrated   1. The left ventricle has normal systolic function with an ejection fraction of 60-65%. The cavity size was normal. Left ventricular diastolic Doppler parameters are consistent with impaired relaxation.  2. The mitral valve is grossly normal.  3. The tricuspid valve is grossly normal.  4. The aortic valve was not well visualized. Aortic valve regurgitation is trivial by color flow Doppler. No stenosis of the aortic valve.  5. Normal LV function; mild diastolic dysfunction; trace AI; mild TR; mildly elevated pulmonary pressure.  Epic records are reviewed at length today  Assessment and Plan:  1. Paroxysmal atrial fibrillation/atrial tachycardia  Patient appears to be maintaining SR. Continue Eliquis 2.5 mg BID.  Continue metoprolol 25 mg daily Patient due for labs with PCP later this week.  This patients CHA2DS2-VASc Score and unadjusted Ischemic Stroke Rate (% per year) is equal to 7.2 % stroke rate/year from a score of 5  Above score calculated as 1 point each if present [CHF, HTN, DM, Vascular=MI/PAD/Aortic Plaque, Age if 65-74, or Female] Above score calculated as 2 points each if present [Age > 75, or Stroke/TIA/TE]   2. HTN Stable, no changes today.   Follow up in the AF clinic in 4 months.    [facility] [address] [place], [place] [address] [phone] 08/01/2019 2:22 PM

## 2019-08-02 DIAGNOSIS — I10 Essential (primary) hypertension: Secondary | ICD-10-CM | POA: Diagnosis not present

## 2019-08-02 DIAGNOSIS — D692 Other nonthrombocytopenic purpura: Secondary | ICD-10-CM | POA: Diagnosis not present

## 2019-08-02 DIAGNOSIS — J984 Other disorders of lung: Secondary | ICD-10-CM | POA: Diagnosis not present

## 2019-08-02 DIAGNOSIS — Z7689 Persons encountering health services in other specified circumstances: Secondary | ICD-10-CM | POA: Diagnosis not present

## 2019-08-02 DIAGNOSIS — N1832 Chronic kidney disease, stage 3b: Secondary | ICD-10-CM | POA: Diagnosis not present

## 2019-08-02 DIAGNOSIS — Z9981 Dependence on supplemental oxygen: Secondary | ICD-10-CM | POA: Diagnosis not present

## 2019-08-02 DIAGNOSIS — K219 Gastro-esophageal reflux disease without esophagitis: Secondary | ICD-10-CM | POA: Diagnosis not present

## 2019-08-02 DIAGNOSIS — J302 Other seasonal allergic rhinitis: Secondary | ICD-10-CM | POA: Diagnosis not present

## 2019-08-02 DIAGNOSIS — R0609 Other forms of dyspnea: Secondary | ICD-10-CM | POA: Diagnosis not present

## 2019-08-02 DIAGNOSIS — I129 Hypertensive chronic kidney disease with stage 1 through stage 4 chronic kidney disease, or unspecified chronic kidney disease: Secondary | ICD-10-CM | POA: Diagnosis not present

## 2019-08-02 DIAGNOSIS — R1310 Dysphagia, unspecified: Secondary | ICD-10-CM | POA: Diagnosis not present

## 2019-08-02 DIAGNOSIS — J449 Chronic obstructive pulmonary disease, unspecified: Secondary | ICD-10-CM | POA: Diagnosis not present

## 2019-08-24 ENCOUNTER — Other Ambulatory Visit: Payer: Self-pay | Admitting: *Deleted

## 2019-08-24 ENCOUNTER — Ambulatory Visit (INDEPENDENT_AMBULATORY_CARE_PROVIDER_SITE_OTHER): Payer: Medicare Other | Admitting: Internal Medicine

## 2019-08-24 ENCOUNTER — Other Ambulatory Visit: Payer: Self-pay

## 2019-08-24 ENCOUNTER — Encounter: Payer: Self-pay | Admitting: Internal Medicine

## 2019-08-24 VITALS — BP 130/70 | HR 83 | Temp 97.9°F | Ht 60.0 in | Wt 142.8 lb

## 2019-08-24 DIAGNOSIS — J310 Chronic rhinitis: Secondary | ICD-10-CM | POA: Diagnosis not present

## 2019-08-24 DIAGNOSIS — J9611 Chronic respiratory failure with hypoxia: Secondary | ICD-10-CM

## 2019-08-24 DIAGNOSIS — J441 Chronic obstructive pulmonary disease with (acute) exacerbation: Secondary | ICD-10-CM

## 2019-08-24 DIAGNOSIS — K219 Gastro-esophageal reflux disease without esophagitis: Secondary | ICD-10-CM | POA: Diagnosis not present

## 2019-08-24 DIAGNOSIS — R0602 Shortness of breath: Secondary | ICD-10-CM | POA: Diagnosis not present

## 2019-08-24 DIAGNOSIS — Z87891 Personal history of nicotine dependence: Secondary | ICD-10-CM | POA: Diagnosis not present

## 2019-08-24 NOTE — Patient Instructions (Signed)
The patient should have follow up scheduled with myself in 1 months.   Prior to next visit patient should have: Full set of PFTs CT Chest without contrast

## 2019-08-24 NOTE — Progress Notes (Signed)
Judith Boone    [id number]    August 08, 1931  Primary Care Physician:Velna Hatchet, MD  Referring Physician: Rolene Course, PA-C [address] [place],  [place] [address] Reason for Consultation: shortness of breath Date of Consultation: 08/24/2019  Chief complaint:   Chief Complaint  Patient presents with  . Consult    COPD, oxygen dependent 2L as needed, doe, allergies     HPI: Judith Boone is a 86 y.o. woman with a 60 pack year smoking history and scoliosis who presents for new patient referral for progressive shortness of breath. She feels spring allergies have worsened things over the last few months. She used to be an avid hiker but now has difficulty walking to the mailbox.   She was hospitalized for pneumonia last fall. She was discharged home with [facility] continuously. Her oxygen saturation will drop with exertion sometimes as low as 82%.   Additionally has a hiatal hernia and active reflux for which she is treated with H2 and PPI daily.  Takes anoro daily and albuterol. Albuterol use can be 3-4 times/day. She thinks the inhalers help but she isn't sure she is taking a deep enough breathing to get the medicine in.   For allergies takes cetirizine but switched to loratidine yesterday which helps more. She has ongoing nasal congestion and rhinitis. She does have chronic cough with clear mucus production. No hemoptysis. She was treated with prednisone and antibiotics for a "sinus infection" last month and prednisone does help her breathing as well.   Used to live in the  is worried about exposure to endemic mycoses.  Social history:  Social History   Occupational History  . Not on file  Tobacco Use  . Smoking status: Former Smoker    Packs/day: 2.00    Years: 30.00    Pack years: 60.00    Types: Cigarettes    Quit date: 04/29/1979    Years since quitting: 40.3  . Smokeless tobacco: Never Used  Substance and Sexual Activity  . Alcohol use: Yes   Alcohol/week: 2.0 - 3.0 standard drinks    Types: 2 - 3 Glasses of wine per week    Comment: weekly  . Drug use: No  . Sexual activity: Not on file    Relevant family history:  Family History  Problem Relation Age of Onset  . Alcohol abuse Father   . Heart disease Father   . Hypertension Father   . Hypertension Daughter   . Hypertension Son   . Asthma Son     Past Medical History:  Diagnosis Date  . Allergy    allergic rhinitis  . Arthritis   . Cellulitis of left leg 07-2014   hospitalized for 4 days  . COPD (chronic obstructive pulmonary disease) ([place])   . GERD (gastroesophageal reflux disease)   . History of granulomatous disease   . Hyperlipidemia   . Hypertension   . Left leg swelling   . Right-sided low back pain with sciatica     Past Surgical History:  Procedure Laterality Date  . ABDOMINAL HYSTERECTOMY    . broken ankle repair     left  . CATARACT EXTRACTION    . TOTAL KNEE ARTHROPLASTY     right      Review of systems: Review of Systems  Constitutional: Negative for chills, fever and weight loss.  HENT: Positive for congestion. Negative for sinus pain and sore throat.   Eyes: Negative for discharge  and redness.  Respiratory: Positive for cough and shortness of breath. Negative for hemoptysis, sputum production and wheezing.   Cardiovascular: Negative for chest pain, palpitations and leg swelling.  Gastrointestinal: Positive for heartburn. Negative for nausea and vomiting.  Musculoskeletal: Negative for joint pain and myalgias.  Skin: Negative for rash.  Neurological: Negative for dizziness, tremors, focal weakness and headaches.  Endo/Heme/Allergies: Negative for environmental allergies.  Psychiatric/Behavioral: Negative for depression. The patient is not nervous/anxious.   All other systems reviewed and are negative.   Physical Exam: Blood pressure 130/70, pulse 83, temperature 97.9 F (36.6 C), temperature source Temporal, height 5' (1.524  m), weight 142 lb 12.8 oz (64.8 kg), SpO2 92 %. Gen:      No acute distress ENT:  no nasal polyps, mucus membranes moist Lungs:    No increased respiratory effort, symmetric chest wall excursion, clear to auscultation bilaterally, scoliosis, kyphosis, no wheezes or crackles CV:         Regular rate and rhythm; no murmurs, rubs, or gallops.  No pedal edema Abd:      + bowel sounds; soft, non-tender; no distension MSK: no acute synovitis of DIP or PIP joints, heberdens nodules bilateral hands Skin:      Warm and dry; no rashes Neuro: normal speech, no focal facial asymmetry Psych: alert and oriented x3, normal mood and affect   Data Reviewed/Medical Decision Making:  Independent interpretation of tests: Imaging: . Review of patient's chest x-ray October 2020 images revealed hyperinflation of the lungs, flattened diaphragms, bilateral lower lobe interstitial opacities, scoliosis. The patient's images have been independently reviewed by me.    PFTs:  Not available for review  Labs:  Lab Results  Component Value Date   WBC 9.1 02/07/2019   HGB 8.6 (L) 02/07/2019   HCT 26.5 (L) 02/07/2019   MCV 83.1 02/07/2019   PLT 272 02/07/2019   Lab Results  Component Value Date   NA 142 02/07/2019   K 3.5 02/07/2019   CL 111 02/07/2019   CO2 20 (L) 02/07/2019   Echocardiogram June 2020  1. The left ventricle has normal systolic function with an ejection  fraction of 60-65%. The cavity size was normal. Left ventricular diastolic  Doppler parameters are consistent with impaired relaxation.  2. The mitral valve is grossly normal.  3. The tricuspid valve is grossly normal.  4. The aortic valve was not well visualized. Aortic valve regurgitation  is trivial by color flow Doppler. No stenosis of the aortic valve.  5. Normal LV function; mild diastolic dysfunction; trace AI; mild TR;  mildly elevated pulmonary pressure.   Immunization status:  Immunization History  Administered Date(s)  Administered  . Fluad Quad(high Dose 65+) 02/04/2019  . PFIZER SARS-COV-2 Vaccination 05/13/2019, 06/02/2019  . Pneumococcal Polysaccharide-23 08/19/2014  . Tdap 08/19/2014    . I reviewed prior external note(s) from hospital stay, Dr. Ardeth Perfect . I reviewed the result(s) of the labs and imaging as noted above.  . I have ordered CT chest, PFTs   Assessment:  Chronic obstructive pulmonary disease Scoliosis Recurrent aspiration Chronic hypoxemic respiratory failure Seasonal allergic rhinitis  Plan/Recommendations:  Ms. Kasa has worsening dyspnea on exertion which is likely multifactorial from her chronic obstructive pulmonary disease, scoliosis and suspected recurrent aspiration from uncontrolled reflux.  She has not had any recent pulmonary function testing.  We will start by obtaining PFTs to determine whether the restriction or airflow limitation from her COPD is a more predominant physiology.  She can continue Anoro and  as needed albuterol for now.  Given her persistent bilateral lower lobe airspace opacities I will obtain a CT chest to further quantify the nature of his bibasilar infiltrates.  It is possible she has recurrent but could be contributing to restriction as well.  Continue over-the-counter antihistamines and nasal saline rinses for her rhinitis.  We discussed disease management and progression at length today.   Return to Care: Return in about 4 weeks (around 09/21/2019).  Lenice Llamas, MD [facility] [place]  CC: Rolene Course, [place]

## 2019-08-30 ENCOUNTER — Ambulatory Visit (HOSPITAL_BASED_OUTPATIENT_CLINIC_OR_DEPARTMENT_OTHER): Payer: Medicare Other

## 2019-08-31 ENCOUNTER — Ambulatory Visit (HOSPITAL_BASED_OUTPATIENT_CLINIC_OR_DEPARTMENT_OTHER): Payer: Medicare Other

## 2019-09-02 ENCOUNTER — Other Ambulatory Visit: Payer: Self-pay

## 2019-09-02 ENCOUNTER — Ambulatory Visit (HOSPITAL_BASED_OUTPATIENT_CLINIC_OR_DEPARTMENT_OTHER)
Admission: RE | Admit: 2019-09-02 | Discharge: 2019-09-02 | Disposition: A | Discharge status: Home / Self Care | Payer: Medicare Other | Source: Ambulatory Visit | Attending: Internal Medicine | Admitting: Internal Medicine

## 2019-09-02 DIAGNOSIS — J441 Chronic obstructive pulmonary disease with (acute) exacerbation: Secondary | ICD-10-CM | POA: Insufficient documentation

## 2019-09-02 DIAGNOSIS — R0602 Shortness of breath: Secondary | ICD-10-CM | POA: Insufficient documentation

## 2019-09-07 ENCOUNTER — Telehealth: Payer: Self-pay | Admitting: Internal Medicine

## 2019-09-07 NOTE — Telephone Encounter (Signed)
Please call and let the patient know that her CT scan is consistent with her known diagnosis of COPD and emphysema.  She will follow up with [place] after her breathing testing - please note her appointment on June 9th needs to be rescheduled with an APP as my last day before maternity leave is June 4th.

## 2019-09-09 NOTE — Telephone Encounter (Signed)
ATC patient Judith Boone to go over Ct results and her appt on 6/9 needs to be rescheduled with an APP instead of Dr. Shearon Stalls as she will be on maternity leave.

## 2019-09-12 DIAGNOSIS — H35053 Retinal neovascularization, unspecified, bilateral: Secondary | ICD-10-CM | POA: Diagnosis not present

## 2019-09-12 DIAGNOSIS — H35453 Secondary pigmentary degeneration, bilateral: Secondary | ICD-10-CM | POA: Diagnosis not present

## 2019-09-12 DIAGNOSIS — H3563 Retinal hemorrhage, bilateral: Secondary | ICD-10-CM | POA: Diagnosis not present

## 2019-09-12 DIAGNOSIS — H35363 Drusen (degenerative) of macula, bilateral: Secondary | ICD-10-CM | POA: Diagnosis not present

## 2019-09-12 DIAGNOSIS — H35723 Serous detachment of retinal pigment epithelium, bilateral: Secondary | ICD-10-CM | POA: Diagnosis not present

## 2019-09-12 DIAGNOSIS — H353211 Exudative age-related macular degeneration, right eye, with active choroidal neovascularization: Secondary | ICD-10-CM | POA: Diagnosis not present

## 2019-09-12 NOTE — Telephone Encounter (Signed)
Pt returning a phone call. Pt can be reached at [phone].

## 2019-09-12 NOTE — Telephone Encounter (Signed)
Left message for patient to call back  

## 2019-09-13 NOTE — Telephone Encounter (Signed)
Called and spoke with patient about CT results from Dr. Shearon Stalls as well as upcoming appointments with our office. She is aware of dates and time for covid test, PFT and Televisit. Nothing further needed at this time.

## 2019-09-19 DIAGNOSIS — M47816 Spondylosis without myelopathy or radiculopathy, lumbar region: Secondary | ICD-10-CM | POA: Diagnosis not present

## 2019-09-30 ENCOUNTER — Other Ambulatory Visit (HOSPITAL_COMMUNITY)
Admission: RE | Admit: 2019-09-30 | Discharge: 2019-09-30 | Disposition: A | Discharge status: Home / Self Care | Payer: Medicare Other | Source: Ambulatory Visit | Attending: Internal Medicine | Admitting: Internal Medicine

## 2019-09-30 DIAGNOSIS — Z20822 Contact with and (suspected) exposure to covid-19: Secondary | ICD-10-CM | POA: Diagnosis not present

## 2019-09-30 DIAGNOSIS — Z01812 Encounter for preprocedural laboratory examination: Secondary | ICD-10-CM | POA: Insufficient documentation

## 2019-10-01 LAB — SARS CORONAVIRUS 2 (TAT 6-24 HRS): SARS Coronavirus 2: NEGATIVE

## 2019-10-05 ENCOUNTER — Ambulatory Visit (INDEPENDENT_AMBULATORY_CARE_PROVIDER_SITE_OTHER): Payer: Medicare Other | Admitting: Internal Medicine

## 2019-10-05 ENCOUNTER — Ambulatory Visit: Payer: Medicare Other | Admitting: Internal Medicine

## 2019-10-05 ENCOUNTER — Other Ambulatory Visit: Payer: Self-pay

## 2019-10-05 DIAGNOSIS — R0602 Shortness of breath: Secondary | ICD-10-CM

## 2019-10-05 DIAGNOSIS — J441 Chronic obstructive pulmonary disease with (acute) exacerbation: Secondary | ICD-10-CM

## 2019-10-05 LAB — PULMONARY FUNCTION TEST
DL/VA % pred: 54 %
DL/VA: 2.25 ml/min/mmHg/L
DLCO cor % pred: 54 %
DLCO cor: 9.08 ml/min/mmHg
DLCO unc % pred: 54 %
DLCO unc: 9.08 ml/min/mmHg
FEF 25-75 Post: 0.67 L/sec
FEF 25-75 Pre: 0.42 L/sec
FEF2575-%Change-Post: 60 %
FEF2575-%Pred-Post: 72 %
FEF2575-%Pred-Pre: 44 %
FEV1-%Change-Post: 15 %
FEV1-%Pred-Post: 86 %
FEV1-%Pred-Pre: 74 %
FEV1-Post: 1.25 L
FEV1-Pre: 1.08 L
FEV1FVC-%Change-Post: 6 %
FEV1FVC-%Pred-Pre: 75 %
FEV6-%Change-Post: 12 %
FEV6-%Pred-Post: 114 %
FEV6-%Pred-Pre: 101 %
FEV6-Post: 2.11 L
FEV6-Pre: 1.88 L
FEV6FVC-%Change-Post: 3 %
FEV6FVC-%Pred-Post: 105 %
FEV6FVC-%Pred-Pre: 101 %
FVC-%Change-Post: 8 %
FVC-%Pred-Post: 108 %
FVC-%Pred-Pre: 100 %
FVC-Post: 2.16 L
FVC-Pre: 1.99 L
Post FEV1/FVC ratio: 58 %
Post FEV6/FVC ratio: 98 %
Pre FEV1/FVC ratio: 54 %
Pre FEV6/FVC Ratio: 95 %
RV % pred: 138 %
RV: 3.27 L
TLC % pred: 113 %
TLC: 5.25 L

## 2019-10-05 NOTE — Progress Notes (Signed)
PFT done today. 

## 2019-10-07 ENCOUNTER — Other Ambulatory Visit: Payer: Self-pay

## 2019-10-07 ENCOUNTER — Ambulatory Visit (INDEPENDENT_AMBULATORY_CARE_PROVIDER_SITE_OTHER): Payer: Medicare Other | Admitting: Adult Health

## 2019-10-07 ENCOUNTER — Encounter: Payer: Self-pay | Admitting: Adult Health

## 2019-10-07 DIAGNOSIS — J449 Chronic obstructive pulmonary disease, unspecified: Secondary | ICD-10-CM

## 2019-10-07 DIAGNOSIS — J9611 Chronic respiratory failure with hypoxia: Secondary | ICD-10-CM

## 2019-10-07 MED ORDER — AEROCHAMBER MV MISC: 0 refills | Status: AC

## 2019-10-07 NOTE — Patient Instructions (Addendum)
Continue on ANORO 1 puff daily. Rinse your mouth out after inhaler use Ventolin as needed. We will send a spacer to the pharmacy. Discussed with primary care provider that Accupril may be aggravating your cough. Activity as tolerated Follow-up in 3 to 4 months with Dr. Shearon Stalls and As needed   Please contact office for sooner follow up if symptoms do not improve or worsen or seek emergency care

## 2019-10-07 NOTE — Progress Notes (Signed)
Virtual Visit via Telephone Note  I connected with Judith Boone on 10/07/19 at  9:30 AM EDT by telephone and verified that I am speaking with the correct person using two identifiers.  Location: Patient: Home  Provider: Office    I discussed the limitations, risks, security and privacy concerns of performing an evaluation and management service by telephone and the availability of in person appointments. I also discussed with the patient that there may be a patient responsible charge related to this service. The patient expressed understanding and agreed to proceed.   History of Present Illness: 86 year old female seen for pulmonary consult 08/24/2019 to establish for COPD and shortness of breath. Patient is oxygen dependent with 2 L with activity  Today's televisit is a 6-week follow-up for COPD and to review pulmonary function testing. Last visit for a pulmonary consult to establish for COPD. Patient had been having some shortness of breath that seems to be slowly getting worse. Patient says she tries to be active but does get winded with heavy activity. She tries to exercise about 3 days a week. She does use her rolling walker which seems to help quite a bit. She did have pneumonia in the fall 2020. She was hospitalized. And started on oxygen at discharge. Patient says her oxygen levels have been doing well at rest. Does drop sometimes with activity. Patient was set up for pulmonary function testing that was done on 10/05/2019 that showed mild to moderate airflow obstruction with an FEV1 at 86%, ratio 58, FVC 108%. Positive significant bronchodilator response. Positive mid flow obstruction and reversibility. DLCO 54%. Patient was set up for a CT chest that was done last month 09/02/2019 that showed emphysema, scattered calcifications throughout both lungs consistent with granulomatous disease hernia. She says she does have a daily dry cough. Remains on Anoro. Uses albuterol as needed. Does feel  that it is hard to use her Ventolin at times is hard to coordinate the breaths.  Patient is on ACE inhibitor..   Patient Active Problem List   Diagnosis Date Noted  . Secondary hypercoagulable state ([place]) 03/31/2019  . Pneumonia 02/03/2019  . AKI (acute kidney injury) ([place]) 02/03/2019  . Paroxysmal atrial fibrillation ([place]) 02/03/2019  . CKD (chronic kidney disease) stage 3, GFR 30-59 ml/min 08/17/2014  . Sepsis ([place]) 08/17/2014  . Cellulitis of leg, left 08/16/2014  . Pain of great toe 03/23/2012  . Leg swelling 03/23/2012  . Decreased hearing 12/23/2011  . Renal insufficiency 09/27/2011  . Insomnia 05/16/2011  . Radicular leg pain 05/16/2011  . Hearing loss 12/12/2010  . HTN (hypertension) 07/02/2010  . Hypercholesteremia 07/02/2010  . Arthritis 07/02/2010  . GERD (gastroesophageal reflux disease) 07/02/2010  . Esophageal web 07/02/2010  . PAD (peripheral artery disease) ([place]) 07/02/2010    Current Outpatient Medications on File Prior to Visit  Medication Sig Dispense Refill  . amLODipine (NORVASC) 5 MG tablet Take 5 mg by mouth at bedtime.   5  . ANORO ELLIPTA 62.5-25 MCG/INH AEPB Inhale 1 puff into the lungs daily.    Marland Kitchen ascorbic acid (VITAMIN C) 500 MG tablet Take by mouth.    Marland Kitchen atorvastatin (LIPITOR) 20 MG tablet Take 20 mg by mouth at bedtime.     . calcium carbonate (OSCAL) 1500 (600 Ca) MG TABS tablet Take by mouth.    Arne Cleveland 2.5 MG TABS tablet TAKE 1 TABLET (2.5 MG TOTAL) BY MOUTH 2 (TWO) TIMES DAILY 180 tablet 2  . famotidine (PEPCID) 20 MG tablet Take  20 mg by mouth at bedtime.     . fluticasone (FLONASE) 50 MCG/ACT nasal spray Place 2 sprays into both nostrils daily.    . hydrALAZINE (APRESOLINE) 25 MG tablet Take 25 mg by mouth 2 (two) times daily.     Marland Kitchen loratadine (CLARITIN) 10 MG tablet Take 10 mg by mouth daily.    . metoprolol succinate (TOPROL-XL) 25 MG 24 hr tablet Take 1 tablet (25 mg total) by mouth daily. 90 tablet 2  . montelukast (SINGULAIR) 10 MG  tablet Take 10 mg by mouth daily.    Marland Kitchen omeprazole (PRILOSEC) 40 MG capsule Take 40 mg by mouth daily.     . pramipexole (MIRAPEX) 0.125 MG tablet Take 0.125 mg by mouth at bedtime.  3  . quinapril (ACCUPRIL) 40 MG tablet Take 40 mg by mouth daily.     . VENTOLIN HFA 108 (90 BASE) MCG/ACT inhaler Inhale 1-2 puffs into the lungs every 6 (six) hours as needed for wheezing.   3   No current facility-administered medications on file prior to visit.     Observations/Objective: Speaks in full sentences .   Assessment and Plan: Gold B/ Stage 2 COPD -patient seems to be doing well on Anoro. We will make no changes to her current maintenance regimen. We will add a spacer to see if that helps with her Ventolin use. Encouraged her on activity as tolerated. Discussed pulmonary rehab however she declines at this time. Patient is on ACE inhibitor which may be aggravating her daily cough. Have advised her discussed with her primary care provider and alternative if possible.  Exertional hypoxemia. Have encouraged her to use her oxygen 2 L with activity to keep O2 saturations greater than 88 to 90%. This may also help with her stamina and ability to exercise.  Plan  Patient Instructions  Continue on ANORO 1 puff daily. Rinse your mouth out after inhaler use Ventolin as needed. We will send a spacer to the pharmacy. Discussed with primary care provider that Accupril may be aggravating your cough. Activity as tolerated Follow-up in 3 to 4 months with Dr. Shearon Stalls and As needed   Please contact office for sooner follow up if symptoms do not improve or worsen or seek emergency care       Follow Up Instructions:    I discussed the assessment and treatment plan with the patient. The patient was provided an opportunity to ask questions and all were answered. The patient agreed with the plan and demonstrated an understanding of the instructions.   The patient was advised to call back or seek an in-person  evaluation if the symptoms worsen or if the condition fails to improve as anticipated.  I provided  25  minutes of non-face-to-face time during this encounter.   Rexene Edison, NP

## 2019-10-10 DIAGNOSIS — L570 Actinic keratosis: Secondary | ICD-10-CM | POA: Diagnosis not present

## 2019-10-10 DIAGNOSIS — Z85828 Personal history of other malignant neoplasm of skin: Secondary | ICD-10-CM | POA: Diagnosis not present

## 2019-10-10 DIAGNOSIS — C44311 Basal cell carcinoma of skin of nose: Secondary | ICD-10-CM | POA: Diagnosis not present

## 2019-10-10 DIAGNOSIS — D485 Neoplasm of uncertain behavior of skin: Secondary | ICD-10-CM | POA: Diagnosis not present

## 2019-10-10 DIAGNOSIS — L821 Other seborrheic keratosis: Secondary | ICD-10-CM | POA: Diagnosis not present

## 2019-10-10 DIAGNOSIS — L812 Freckles: Secondary | ICD-10-CM | POA: Diagnosis not present

## 2019-11-23 DIAGNOSIS — H3561 Retinal hemorrhage, right eye: Secondary | ICD-10-CM | POA: Diagnosis not present

## 2019-11-23 DIAGNOSIS — H353211 Exudative age-related macular degeneration, right eye, with active choroidal neovascularization: Secondary | ICD-10-CM | POA: Diagnosis not present

## 2019-11-23 DIAGNOSIS — H353122 Nonexudative age-related macular degeneration, left eye, intermediate dry stage: Secondary | ICD-10-CM | POA: Diagnosis not present

## 2019-11-23 DIAGNOSIS — H35051 Retinal neovascularization, unspecified, right eye: Secondary | ICD-10-CM | POA: Diagnosis not present

## 2019-11-23 DIAGNOSIS — H35453 Secondary pigmentary degeneration, bilateral: Secondary | ICD-10-CM | POA: Diagnosis not present

## 2019-11-23 DIAGNOSIS — H35363 Drusen (degenerative) of macula, bilateral: Secondary | ICD-10-CM | POA: Diagnosis not present

## 2019-12-01 ENCOUNTER — Ambulatory Visit (HOSPITAL_COMMUNITY): Payer: Medicare Other | Admitting: Physician Assistant

## 2019-12-05 DIAGNOSIS — Z961 Presence of intraocular lens: Secondary | ICD-10-CM | POA: Diagnosis not present

## 2019-12-05 DIAGNOSIS — H353211 Exudative age-related macular degeneration, right eye, with active choroidal neovascularization: Secondary | ICD-10-CM | POA: Diagnosis not present

## 2019-12-05 DIAGNOSIS — H524 Presbyopia: Secondary | ICD-10-CM | POA: Diagnosis not present

## 2019-12-05 DIAGNOSIS — H0100A Unspecified blepharitis right eye, upper and lower eyelids: Secondary | ICD-10-CM | POA: Diagnosis not present

## 2019-12-07 ENCOUNTER — Other Ambulatory Visit: Payer: Self-pay

## 2019-12-07 ENCOUNTER — Ambulatory Visit (HOSPITAL_COMMUNITY)
Admission: RE | Admit: 2019-12-07 | Discharge: 2019-12-07 | Disposition: A | Discharge status: Home / Self Care | Payer: Medicare Other | Source: Ambulatory Visit | Attending: Physician Assistant | Admitting: Physician Assistant

## 2019-12-07 ENCOUNTER — Encounter (HOSPITAL_COMMUNITY): Payer: Self-pay | Admitting: Physician Assistant

## 2019-12-07 VITALS — BP 150/66 | HR 81 | Ht 60.0 in | Wt 145.4 lb

## 2019-12-07 DIAGNOSIS — M1990 Unspecified osteoarthritis, unspecified site: Secondary | ICD-10-CM | POA: Insufficient documentation

## 2019-12-07 DIAGNOSIS — I480 Paroxysmal atrial fibrillation: Secondary | ICD-10-CM | POA: Diagnosis not present

## 2019-12-07 DIAGNOSIS — I10 Essential (primary) hypertension: Secondary | ICD-10-CM | POA: Insufficient documentation

## 2019-12-07 DIAGNOSIS — J309 Allergic rhinitis, unspecified: Secondary | ICD-10-CM | POA: Insufficient documentation

## 2019-12-07 DIAGNOSIS — D6869 Other thrombophilia: Secondary | ICD-10-CM

## 2019-12-07 DIAGNOSIS — Z87891 Personal history of nicotine dependence: Secondary | ICD-10-CM | POA: Insufficient documentation

## 2019-12-07 DIAGNOSIS — Z7901 Long term (current) use of anticoagulants: Secondary | ICD-10-CM | POA: Diagnosis not present

## 2019-12-07 DIAGNOSIS — Z8249 Family history of ischemic heart disease and other diseases of the circulatory system: Secondary | ICD-10-CM | POA: Insufficient documentation

## 2019-12-07 DIAGNOSIS — Z881 Allergy status to other antibiotic agents status: Secondary | ICD-10-CM | POA: Diagnosis not present

## 2019-12-07 DIAGNOSIS — Z888 Allergy status to other drugs, medicaments and biological substances status: Secondary | ICD-10-CM | POA: Diagnosis not present

## 2019-12-07 DIAGNOSIS — K219 Gastro-esophageal reflux disease without esophagitis: Secondary | ICD-10-CM | POA: Insufficient documentation

## 2019-12-07 DIAGNOSIS — Z79899 Other long term (current) drug therapy: Secondary | ICD-10-CM | POA: Diagnosis not present

## 2019-12-07 DIAGNOSIS — R600 Localized edema: Secondary | ICD-10-CM | POA: Diagnosis not present

## 2019-12-07 DIAGNOSIS — J449 Chronic obstructive pulmonary disease, unspecified: Secondary | ICD-10-CM | POA: Diagnosis not present

## 2019-12-07 DIAGNOSIS — Z96651 Presence of right artificial knee joint: Secondary | ICD-10-CM | POA: Diagnosis not present

## 2019-12-07 DIAGNOSIS — E785 Hyperlipidemia, unspecified: Secondary | ICD-10-CM | POA: Diagnosis not present

## 2019-12-07 NOTE — Progress Notes (Signed)
Primary Care Physician: Velna Hatchet, MD Primary Cardiologist: none Primary Electrophysiologist: none Referring Physician: [facility]   Judith Boone is a 86 y.o. female with a history of COPD, HTN, HLD, PVD and paroxysmal atrial fibrillation who presents for follow up in the [facility].  The patient was initially diagnosed with atrial fibrillation 07/17/18 after presenting to the ER with elevated heart rates on her Apple Watch. She was asymptomatic during the event. She was started on Eliquis for a CHADS2VASC score 5 and metoprolol.  Zio patch 03/2019 showed no afib with brief epidodes of atrial tach and occasional PACs and PVCs.   On follow up today, patient reports that she has done well since her last visit. She denies any heart racing or palpitations. She denies bleeding issues on anticoagulation. She has noted some lower extremity edema.  Today, she denies symptoms of palpitations, chest pain, shortness of breath, orthopnea, PND, dizziness, presyncope, syncope, snoring, daytime somnolence, bleeding, or neurologic sequela. The patient is tolerating medications without difficulties and is otherwise without complaint today.    Atrial Fibrillation Risk Factors:  she does not have symptoms or diagnosis of sleep apnea. she does not have a history of rheumatic fever. she does not have a history of alcohol use. The patient does not have a history of early familial atrial fibrillation or other arrhythmias.  she has a BMI of Body mass index is 28.4 kg/m.Marland Kitchen Filed Weights   12/07/19 1332  Weight: 66 kg    Family History  Problem Relation Age of Onset   Alcohol abuse Father    Heart disease Father    Hypertension Father    Hypertension Daughter    Hypertension Son    Asthma Son      Atrial Fibrillation Management history:  Previous antiarrhythmic drugs: none Previous cardioversions: none Previous ablations: none CHADS2VASC score:  5 Anticoagulation history: Eliquis   Past Medical History:  Diagnosis Date   Allergy    allergic rhinitis   Arthritis    Cellulitis of left leg 07-2014   hospitalized for 4 days   COPD (chronic obstructive pulmonary disease) ([facility])    GERD (gastroesophageal reflux disease)    History of granulomatous disease    Hyperlipidemia    Hypertension    Left leg swelling    Right-sided low back pain with sciatica    Past Surgical History:  Procedure Laterality Date   ABDOMINAL HYSTERECTOMY     broken ankle repair     left   CATARACT EXTRACTION     TOTAL KNEE ARTHROPLASTY     right    Current Outpatient Medications  Medication Sig Dispense Refill   amLODipine (NORVASC) 5 MG tablet Take 5 mg by mouth at bedtime.   5   ANORO ELLIPTA 62.5-25 MCG/INH AEPB Inhale 1 puff into the lungs daily.     ascorbic acid (VITAMIN C) 500 MG tablet Take by mouth.     atorvastatin (LIPITOR) 20 MG tablet Take 20 mg by mouth at bedtime.      calcium carbonate (OSCAL) 1500 (600 Ca) MG TABS tablet Take by mouth.     cetirizine (ZYRTEC) 10 MG tablet Take 10 mg by mouth daily.     ELIQUIS 2.5 MG TABS tablet TAKE 1 TABLET (2.5 MG TOTAL) BY MOUTH 2 (TWO) TIMES DAILY 180 tablet 2   famotidine (PEPCID) 20 MG tablet Take 20 mg by mouth at bedtime.      fluticasone (FLONASE) 50 MCG/ACT nasal  spray Place 2 sprays into both nostrils daily.     hydrALAZINE (APRESOLINE) 25 MG tablet Take 25 mg by mouth 2 (two) times daily.      metoprolol succinate (TOPROL-XL) 25 MG 24 hr tablet Take 1 tablet (25 mg total) by mouth daily. 90 tablet 2   montelukast (SINGULAIR) 10 MG tablet Take 10 mg by mouth daily.     omeprazole (PRILOSEC) 40 MG capsule Take 40 mg by mouth daily.      pramipexole (MIRAPEX) 0.125 MG tablet Take 0.125 mg by mouth at bedtime.  3   quinapril (ACCUPRIL) 40 MG tablet Take 40 mg by mouth daily.      Spacer/Aero-Holding Chambers (AEROCHAMBER MV) inhaler Use as instructed with  Albuterol HFA 1 each 0   VENTOLIN HFA 108 (90 BASE) MCG/ACT inhaler Inhale 1-2 puffs into the lungs every 6 (six) hours as needed for wheezing.   3   No current facility-administered medications for this encounter.    Allergies  Allergen Reactions   Bacitracin     unknown   Neosporin [Neomycin-Bacitracin Zn-Polymyx]     unknown   Polysporin [Bacitracin-Polymyxin B]     unknown    Social History   Socioeconomic History   Marital status: Widowed    Spouse name: Not on file   Number of children: Not on file   Years of education: Not on file   Highest education level: Not on file  Occupational History   Not on file  Tobacco Use   Smoking status: Former Smoker    Packs/day: 2.00    Years: 30.00    Pack years: 60.00    Types: Cigarettes    Quit date: 04/29/1979    Years since quitting: 40.6   Smokeless tobacco: Never Used  Vaping Use   Vaping Use: Never assessed  Substance and Sexual Activity   Alcohol use: Yes    Alcohol/week: 2.0 - 3.0 standard drinks    Types: 2 - 3 Glasses of wine per week    Comment: weekly   Drug use: No   Sexual activity: Not on file  Other Topics Concern   Not on file  Social History Narrative   Not on file   Social Determinants of Health   Financial Resource Strain:    Difficulty of Paying Living Expenses:   Food Insecurity:    Worried About Charity fundraiser in the Last Year:    Arboriculturist in the Last Year:   Transportation Needs:    Film/video editor (Medical):    Lack of Transportation (Non-Medical):   Physical Activity:    Days of Exercise per Week:    Minutes of Exercise per Session:   Stress:    Feeling of Stress :   Social Connections:    Frequency of Communication with Friends and Family:    Frequency of Social Gatherings with Friends and Family:    Attends Religious Services:    Active Member of Clubs or Organizations:    Attends Music therapist:    Marital Status:    Intimate Partner Violence:    Fear of Current or Ex-Partner:    Emotionally Abused:    Physically Abused:    Sexually Abused:      ROS- All systems are reviewed and negative except as per the HPI above.  Physical Exam: Vitals:   12/07/19 1332  BP: (!) 150/66  Pulse: 81  Weight: 66 kg  Height: 5' (1.524 m)  GEN- The patient is well appearing elderly female, alert and oriented x 3 today.   HEENT-head normocephalic, atraumatic, sclera clear, conjunctiva pink, hearing intact, trachea midline. Lungs- Clear to ausculation bilaterally, normal work of breathing Heart- Regular rate and rhythm, no murmurs, rubs or gallops  GI- soft, NT, ND, + BS Extremities- no clubbing, cyanosis. 1+ bilateral edema MS- no significant deformity or atrophy Skin- no rash or lesion Psych- euthymic mood, full affect Neuro- strength and sensation are intact   Wt Readings from Last 3 Encounters:  12/07/19 66 kg  08/24/19 64.8 kg  08/01/19 64 kg    EKG today demonstrates SR HR 81, 1st degree AV block, PR 236, QRS 72, QTc 436  Echo 10/12/18 demonstrated   1. The left ventricle has normal systolic function with an ejection fraction of 60-65%. The cavity size was normal. Left ventricular diastolic Doppler parameters are consistent with impaired relaxation.  2. The mitral valve is grossly normal.  3. The tricuspid valve is grossly normal.  4. The aortic valve was not well visualized. Aortic valve regurgitation is trivial by color flow Doppler. No stenosis of the aortic valve.  5. Normal LV function; mild diastolic dysfunction; trace AI; mild TR; mildly elevated pulmonary pressure.  Epic records are reviewed at length today  Assessment and Plan:  1. Paroxysmal atrial fibrillation/atrial tachycardia  Patient appears to be maintaining SR. Continue Eliquis 2.5 mg BID. (age, Cr) Continue metoprolol 25 mg daily  This patients CHA2DS2-VASc Score and unadjusted Ischemic Stroke Rate (% per year) is  equal to 7.2 % stroke rate/year from a score of 5  Above score calculated as 1 point each if present [CHF, HTN, DM, Vascular=MI/PAD/Aortic Plaque, Age if 65-74, or Female] Above score calculated as 2 points each if present [Age > 75, or Stroke/TIA/TE]  2. HTN Stable, no changes today.  3. Lower extremity edema Patient admits to eating frequent high sodium meals (soup). Encouraged low sodium diet. 2 g Daily weights, encouraged her to call clinic for 3-4 lbs gain overnight or 5-6 lbs gain in one week.   Follow up in the AF clinic in 4 months.    [facility] [address] [place], [place] [address] [phone] 12/07/2019 2:00 PM

## 2019-12-21 DIAGNOSIS — H35363 Drusen (degenerative) of macula, bilateral: Secondary | ICD-10-CM | POA: Diagnosis not present

## 2019-12-21 DIAGNOSIS — H35051 Retinal neovascularization, unspecified, right eye: Secondary | ICD-10-CM | POA: Diagnosis not present

## 2020-01-23 DIAGNOSIS — Z23 Encounter for immunization: Secondary | ICD-10-CM | POA: Diagnosis not present

## 2020-01-29 ENCOUNTER — Emergency Department (HOSPITAL_BASED_OUTPATIENT_CLINIC_OR_DEPARTMENT_OTHER): Payer: Medicare Other

## 2020-01-29 ENCOUNTER — Other Ambulatory Visit: Payer: Self-pay

## 2020-01-29 ENCOUNTER — Encounter (HOSPITAL_BASED_OUTPATIENT_CLINIC_OR_DEPARTMENT_OTHER): Payer: Self-pay | Admitting: *Deleted

## 2020-01-29 ENCOUNTER — Emergency Department (HOSPITAL_BASED_OUTPATIENT_CLINIC_OR_DEPARTMENT_OTHER)
Admission: EM | Admit: 2020-01-29 | Discharge: 2020-01-30 | Disposition: A | Discharge status: Home / Self Care | Payer: Medicare Other | Attending: Emergency Medicine | Admitting: Emergency Medicine

## 2020-01-29 DIAGNOSIS — Z87891 Personal history of nicotine dependence: Secondary | ICD-10-CM | POA: Insufficient documentation

## 2020-01-29 DIAGNOSIS — J449 Chronic obstructive pulmonary disease, unspecified: Secondary | ICD-10-CM | POA: Insufficient documentation

## 2020-01-29 DIAGNOSIS — N183 Chronic kidney disease, stage 3 unspecified: Secondary | ICD-10-CM | POA: Diagnosis not present

## 2020-01-29 DIAGNOSIS — Z7951 Long term (current) use of inhaled steroids: Secondary | ICD-10-CM | POA: Insufficient documentation

## 2020-01-29 DIAGNOSIS — Z7901 Long term (current) use of anticoagulants: Secondary | ICD-10-CM | POA: Insufficient documentation

## 2020-01-29 DIAGNOSIS — R600 Localized edema: Secondary | ICD-10-CM | POA: Diagnosis not present

## 2020-01-29 DIAGNOSIS — M79605 Pain in left leg: Secondary | ICD-10-CM | POA: Diagnosis not present

## 2020-01-29 DIAGNOSIS — L03116 Cellulitis of left lower limb: Secondary | ICD-10-CM

## 2020-01-29 DIAGNOSIS — R590 Localized enlarged lymph nodes: Secondary | ICD-10-CM | POA: Diagnosis not present

## 2020-01-29 DIAGNOSIS — Z96651 Presence of right artificial knee joint: Secondary | ICD-10-CM | POA: Diagnosis not present

## 2020-01-29 DIAGNOSIS — Z79899 Other long term (current) drug therapy: Secondary | ICD-10-CM | POA: Diagnosis not present

## 2020-01-29 DIAGNOSIS — I129 Hypertensive chronic kidney disease with stage 1 through stage 4 chronic kidney disease, or unspecified chronic kidney disease: Secondary | ICD-10-CM | POA: Diagnosis not present

## 2020-01-29 DIAGNOSIS — M7989 Other specified soft tissue disorders: Secondary | ICD-10-CM | POA: Diagnosis not present

## 2020-01-29 MED ORDER — DOXYCYCLINE HYCLATE 100 MG PO TABS
100.0000 mg | ORAL_TABLET | Freq: Once | ORAL | Status: AC
  Administered 2020-01-29: 100 mg via ORAL
  Filled 2020-01-29: qty 1

## 2020-01-29 NOTE — ED Triage Notes (Signed)
Pt reports left leg swelling and pain x 3-4 days in the area where she had surgery ~25 years ago. Reports leg is painful and swollen. She reports temp 99.4 this evening. States she had Pfizer booster shot this week

## 2020-01-30 ENCOUNTER — Emergency Department (HOSPITAL_BASED_OUTPATIENT_CLINIC_OR_DEPARTMENT_OTHER)
Admit: 2020-01-30 | Discharge: 2020-01-30 | Disposition: A | Discharge status: Home / Self Care | Payer: Medicare Other | Attending: Emergency Medicine | Admitting: Emergency Medicine

## 2020-01-30 DIAGNOSIS — R590 Localized enlarged lymph nodes: Secondary | ICD-10-CM | POA: Diagnosis not present

## 2020-01-30 DIAGNOSIS — L03116 Cellulitis of left lower limb: Secondary | ICD-10-CM | POA: Diagnosis not present

## 2020-01-30 DIAGNOSIS — M79605 Pain in left leg: Secondary | ICD-10-CM | POA: Diagnosis not present

## 2020-01-30 DIAGNOSIS — R600 Localized edema: Secondary | ICD-10-CM | POA: Diagnosis not present

## 2020-01-30 MED ORDER — DOXYCYCLINE HYCLATE 100 MG PO CAPS
100.0000 mg | ORAL_CAPSULE | Freq: Two times a day (BID) | ORAL | 0 refills | Status: DC
Start: 2020-01-30 — End: 2020-04-11

## 2020-01-30 NOTE — Discharge Instructions (Addendum)
You were seen today for left lower extremity swelling and redness.  You likely have a skin infection called cellulitis.  Take antibiotics as prescribed.  Monitor for worsening swelling or redness.  If you develop fevers, you need to be reevaluated.  Your x-rays were negative.  Given that you are on Eliquis, there is low suspicion for blood clot.  However, I have scheduled you for an ultrasound tomorrow.  Continue your Eliquis as prescribed.

## 2020-01-30 NOTE — ED Provider Notes (Signed)
[facility] EMERGENCY DEPARTMENT Provider Note   CSN: [id number] Arrival date & time: 01/29/20  1909     History Chief Complaint  Patient presents with  . Leg Pain    Judith Boone is a 86 y.o. female.  HPI     This is an 86 year old female with a history of paroxysmal atrial fibrillation on Eliquis, COPD, hypertension, hyperlipidemia who presents with swelling and pain in the left lower extremity.  Patient reports 3 to 4-day history of worsening swelling and pain in the left lower extremity.  It is difficult to ambulate on secondary to pain.  She denies any new injury but reports remote injury for which she required plates and screws approximately 25 years ago.  She is concerned about possible infection.  She does state that she was at the beach recently and "every time I would go down the steps, I would hit the back of my leg."  She has noted some redness.  She denies any fevers.  She reports compliance with her Eliquis at 2.5 mg/day.  She rates her pain currently at 2 out of 10.  She is not taking anything for pain.  Past Medical History:  Diagnosis Date  . Allergy    allergic rhinitis  . Arthritis   . Cellulitis of left leg 07-2014   hospitalized for 4 days  . COPD (chronic obstructive pulmonary disease) ([place])   . GERD (gastroesophageal reflux disease)   . History of granulomatous disease   . Hyperlipidemia   . Hypertension   . Left leg swelling   . Right-sided low back pain with sciatica     Patient Active Problem List   Diagnosis Date Noted  . Secondary hypercoagulable state ([place]) 03/31/2019  . Pneumonia 02/03/2019  . AKI (acute kidney injury) ([place]) 02/03/2019  . Paroxysmal atrial fibrillation ([place]) 02/03/2019  . CKD (chronic kidney disease) stage 3, GFR 30-59 ml/min ([facility]) 08/17/2014  . Sepsis ([place]) 08/17/2014  . Cellulitis of leg, left 08/16/2014  . Pain of great toe 03/23/2012  . Leg swelling 03/23/2012  . Decreased hearing 12/23/2011  . Renal  insufficiency 09/27/2011  . Insomnia 05/16/2011  . Radicular leg pain 05/16/2011  . Hearing loss 12/12/2010  . HTN (hypertension) 07/02/2010  . Hypercholesteremia 07/02/2010  . Arthritis 07/02/2010  . GERD (gastroesophageal reflux disease) 07/02/2010  . Esophageal web 07/02/2010  . PAD (peripheral artery disease) ([place]) 07/02/2010    Past Surgical History:  Procedure Laterality Date  . ABDOMINAL HYSTERECTOMY    . broken ankle repair     left  . CATARACT EXTRACTION    . TOTAL KNEE ARTHROPLASTY     right     OB History   No obstetric history on file.     Family History  Problem Relation Age of Onset  . Alcohol abuse Father   . Heart disease Father   . Hypertension Father   . Hypertension Daughter   . Hypertension Son   . Asthma Son     Social History   Tobacco Use  . Smoking status: Former Smoker    Packs/day: 2.00    Years: 30.00    Pack years: 60.00    Types: Cigarettes    Quit date: 04/29/1979    Years since quitting: 40.7  . Smokeless tobacco: Never Used  Vaping Use  . Vaping Use: Never assessed  Substance Use Topics  . Alcohol use: Yes    Alcohol/week: 2.0 - 3.0 standard drinks    Types: 2 -  3 Glasses of wine per week    Comment: weekly  . Drug use: No    Home Medications Prior to Admission medications   Medication Sig Start Date End Date Taking? Authorizing Provider  amLODipine (NORVASC) 5 MG tablet Take 5 mg by mouth at bedtime.  04/13/15   [provider]  ANORO ELLIPTA 62.5-25 MCG/INH AEPB Inhale 1 puff into the lungs daily. 07/04/18   [provider]  ascorbic acid (VITAMIN C) 500 MG tablet Take by mouth.    [provider]  atorvastatin (LIPITOR) 20 MG tablet Take 20 mg by mouth at bedtime.     [provider]  calcium carbonate (OSCAL) 1500 (600 Ca) MG TABS tablet Take by mouth.    [provider]  cetirizine (ZYRTEC) 10 MG tablet Take 10 mg by mouth daily.    [provider]  doxycycline  (VIBRAMYCIN) 100 MG capsule Take 1 capsule (100 mg total) by mouth 2 (two) times daily. 01/30/20   Denina Rieger, Barbette Hair, MD  ELIQUIS 2.5 MG TABS tablet TAKE 1 TABLET (2.5 MG TOTAL) BY MOUTH 2 (TWO) TIMES DAILY 07/12/19   Fenton, Clint R, PA  famotidine (PEPCID) 20 MG tablet Take 20 mg by mouth at bedtime.     [provider]  fluticasone (FLONASE) 50 MCG/ACT nasal spray Place 2 sprays into both nostrils daily. 07/13/19   [provider]  hydrALAZINE (APRESOLINE) 25 MG tablet Take 25 mg by mouth 2 (two) times daily.     [provider]  metoprolol succinate (TOPROL-XL) 25 MG 24 hr tablet Take 1 tablet (25 mg total) by mouth daily. 07/23/18   Fenton, Clint R, PA  montelukast (SINGULAIR) 10 MG tablet Take 10 mg by mouth daily. 08/02/19   [provider]  omeprazole (PRILOSEC) 40 MG capsule Take 40 mg by mouth daily.  07/22/18   [provider]  pramipexole (MIRAPEX) 0.125 MG tablet Take 0.125 mg by mouth at bedtime. 02/09/15   [provider]  quinapril (ACCUPRIL) 40 MG tablet Take 40 mg by mouth daily.     [provider]  Spacer/Aero-Holding Chambers (AEROCHAMBER MV) inhaler Use as instructed with Albuterol HFA 10/07/19   Parrett, Tammy S, NP  VENTOLIN HFA 108 (90 BASE) MCG/ACT inhaler Inhale 1-2 puffs into the lungs every 6 (six) hours as needed for wheezing.  03/03/15   [provider]    Allergies    Bacitracin, Neosporin [neomycin-bacitracin zn-polymyx], and Polysporin [bacitracin-polymyxin b]  Review of Systems   Review of Systems  Constitutional: Negative for fever.  Respiratory: Negative for shortness of breath.   Cardiovascular: Positive for leg swelling. Negative for chest pain.  Gastrointestinal: Negative for abdominal pain, nausea and vomiting.  Skin: Positive for color change.  All other systems reviewed and are negative.   Physical Exam Updated Vital Signs BP 131/64   Pulse 93   Temp 97.7 F (36.5 C) (Oral)    Resp 16   Ht 1.524 m (5')   Wt 62.6 kg   SpO2 95%   BMI 26.95 kg/m   Physical Exam Vitals and nursing note reviewed.  Constitutional:      Appearance: She is well-developed.     Comments: Elderly, nontoxic-appearing, no acute distress  HENT:     Head: Normocephalic and atraumatic.     Nose: Nose normal.     Mouth/Throat:     Mouth: Mucous membranes are moist.  Eyes:     Pupils: Pupils are equal, round, and reactive  to light.  Cardiovascular:     Rate and Rhythm: Normal rate. Rhythm irregular.  Pulmonary:     Effort: Pulmonary effort is normal. No respiratory distress.  Abdominal:     Tenderness: There is no abdominal tenderness.  Musculoskeletal:     Cervical back: Neck supple.     Comments: Left lower extremity edema noted with erythema tracking along the lateral aspect of the ankle into the calf, no crepitus noted, old scarring noted, no calf tenderness, 2+ DP pulse  Skin:    General: Skin is warm and dry.  Neurological:     Mental Status: She is alert and oriented to person, place, and time.  Psychiatric:        Mood and Affect: Mood normal.     ED Results / Procedures / Treatments   Labs (all labs ordered are listed, but only abnormal results are displayed) Labs Reviewed - No data to display  EKG None  Radiology DG Ankle Complete Left  Result Date: 01/29/2020 CLINICAL DATA:  Swelling EXAM: LEFT ANKLE COMPLETE - 3+ VIEW COMPARISON:  None. FINDINGS: The patient is status post ORIF with plate screw fixation of the distal fibula. Ankle mortise is intact. There is diffuse soft tissue swelling seen around the ankle. A small ankle joint effusion is present. Scattered vascular calcifications are seen. Calcaneal enthesophytes are noted. IMPRESSION: No acute osseous abnormality. Electronically Signed   By: Prudencio Pair M.D.   On: 01/29/2020 23:36    Procedures Procedures (including critical care time)  Medications Ordered in ED Medications  doxycycline (VIBRA-TABS)  tablet 100 mg (100 mg Oral Given 01/29/20 2323)    ED Course  I have reviewed the triage vital signs and the nursing notes.  Pertinent labs & imaging results that were available during my care of the patient were reviewed by me and considered in my medical decision making (see chart for details).    MDM Rules/Calculators/A&P                           Patient presents with redness and swelling of the left lower extremity.  She is overall nontoxic and vital signs are largely reassuring.  She has an irregular heart rate.  Mildly tachycardic but this is her atrial fibrillation.  She reports compliance with Eliquis.  Considerations include but not limited to, cellulitis, osteomyelitis, DVT although she is compliant.  Screening x-ray obtained and shows hardware is in good place.  There is no gas to suggest deep gaseous infection or osteo-.  Have strong suspicion for cellulitis.  Will start on doxycycline.  Do not have indication for lab work at this time as she is clinically well-appearing and afebrile.  While she is compliant with her Eliquis, will order DVT study for tomorrow to ensure no concomitant blood clot.  After history, exam, and medical workup I feel the patient has been appropriately medically screened and is safe for discharge home. Pertinent diagnoses were discussed with the patient. Patient was given return precautions.   Final Clinical Impression(s) / ED Diagnoses Final diagnoses:  Cellulitis of left lower extremity    Rx / DC Orders ED Discharge Orders         Ordered    doxycycline (VIBRAMYCIN) 100 MG capsule  2 times daily        01/30/20 0001    US Venous Img Lower Unilateral Left        01/30/20 0001  Merryl Hacker, MD 01/30/20 [phone]

## 2020-01-31 ENCOUNTER — Other Ambulatory Visit (HOSPITAL_BASED_OUTPATIENT_CLINIC_OR_DEPARTMENT_OTHER): Payer: Medicare Other

## 2020-02-02 DIAGNOSIS — R609 Edema, unspecified: Secondary | ICD-10-CM | POA: Diagnosis not present

## 2020-02-02 DIAGNOSIS — I82402 Acute embolism and thrombosis of unspecified deep veins of left lower extremity: Secondary | ICD-10-CM | POA: Diagnosis not present

## 2020-02-02 DIAGNOSIS — L03116 Cellulitis of left lower limb: Secondary | ICD-10-CM | POA: Diagnosis not present

## 2020-02-03 DIAGNOSIS — L03116 Cellulitis of left lower limb: Secondary | ICD-10-CM | POA: Diagnosis not present

## 2020-02-07 DIAGNOSIS — S62607A Fracture of unspecified phalanx of left little finger, initial encounter for closed fracture: Secondary | ICD-10-CM | POA: Diagnosis not present

## 2020-02-07 DIAGNOSIS — R739 Hyperglycemia, unspecified: Secondary | ICD-10-CM | POA: Diagnosis not present

## 2020-02-07 DIAGNOSIS — E785 Hyperlipidemia, unspecified: Secondary | ICD-10-CM | POA: Diagnosis not present

## 2020-02-09 DIAGNOSIS — M25572 Pain in left ankle and joints of left foot: Secondary | ICD-10-CM | POA: Diagnosis not present

## 2020-02-10 DIAGNOSIS — L03116 Cellulitis of left lower limb: Secondary | ICD-10-CM | POA: Diagnosis not present

## 2020-02-14 DIAGNOSIS — D692 Other nonthrombocytopenic purpura: Secondary | ICD-10-CM | POA: Diagnosis not present

## 2020-02-14 DIAGNOSIS — E785 Hyperlipidemia, unspecified: Secondary | ICD-10-CM | POA: Diagnosis not present

## 2020-02-14 DIAGNOSIS — I4891 Unspecified atrial fibrillation: Secondary | ICD-10-CM | POA: Diagnosis not present

## 2020-02-14 DIAGNOSIS — I130 Hypertensive heart and chronic kidney disease with heart failure and stage 1 through stage 4 chronic kidney disease, or unspecified chronic kidney disease: Secondary | ICD-10-CM | POA: Diagnosis not present

## 2020-02-14 DIAGNOSIS — Z9981 Dependence on supplemental oxygen: Secondary | ICD-10-CM | POA: Diagnosis not present

## 2020-02-14 DIAGNOSIS — N1832 Chronic kidney disease, stage 3b: Secondary | ICD-10-CM | POA: Diagnosis not present

## 2020-02-14 DIAGNOSIS — L03818 Cellulitis of other sites: Secondary | ICD-10-CM | POA: Diagnosis not present

## 2020-02-14 DIAGNOSIS — F419 Anxiety disorder, unspecified: Secondary | ICD-10-CM | POA: Diagnosis not present

## 2020-02-14 DIAGNOSIS — R82998 Other abnormal findings in urine: Secondary | ICD-10-CM | POA: Diagnosis not present

## 2020-02-14 DIAGNOSIS — D6869 Other thrombophilia: Secondary | ICD-10-CM | POA: Diagnosis not present

## 2020-02-14 DIAGNOSIS — J449 Chronic obstructive pulmonary disease, unspecified: Secondary | ICD-10-CM | POA: Diagnosis not present

## 2020-02-14 DIAGNOSIS — Z23 Encounter for immunization: Secondary | ICD-10-CM | POA: Diagnosis not present

## 2020-02-14 DIAGNOSIS — Z0000 Encounter for general adult medical examination without abnormal findings: Secondary | ICD-10-CM | POA: Diagnosis not present

## 2020-02-16 DIAGNOSIS — Z1212 Encounter for screening for malignant neoplasm of rectum: Secondary | ICD-10-CM | POA: Diagnosis not present

## 2020-03-02 DIAGNOSIS — N1832 Chronic kidney disease, stage 3b: Secondary | ICD-10-CM | POA: Diagnosis not present

## 2020-03-05 DIAGNOSIS — H35363 Drusen (degenerative) of macula, bilateral: Secondary | ICD-10-CM | POA: Diagnosis not present

## 2020-03-05 DIAGNOSIS — H35453 Secondary pigmentary degeneration, bilateral: Secondary | ICD-10-CM | POA: Diagnosis not present

## 2020-03-05 DIAGNOSIS — H3561 Retinal hemorrhage, right eye: Secondary | ICD-10-CM | POA: Diagnosis not present

## 2020-03-05 DIAGNOSIS — H353211 Exudative age-related macular degeneration, right eye, with active choroidal neovascularization: Secondary | ICD-10-CM | POA: Diagnosis not present

## 2020-03-05 DIAGNOSIS — H353122 Nonexudative age-related macular degeneration, left eye, intermediate dry stage: Secondary | ICD-10-CM | POA: Diagnosis not present

## 2020-03-05 DIAGNOSIS — H35051 Retinal neovascularization, unspecified, right eye: Secondary | ICD-10-CM | POA: Diagnosis not present

## 2020-03-06 DIAGNOSIS — I872 Venous insufficiency (chronic) (peripheral): Secondary | ICD-10-CM | POA: Diagnosis not present

## 2020-03-06 DIAGNOSIS — I743 Embolism and thrombosis of arteries of the lower extremities: Secondary | ICD-10-CM | POA: Diagnosis not present

## 2020-03-06 DIAGNOSIS — L03116 Cellulitis of left lower limb: Secondary | ICD-10-CM | POA: Diagnosis not present

## 2020-03-06 DIAGNOSIS — L03818 Cellulitis of other sites: Secondary | ICD-10-CM | POA: Diagnosis not present

## 2020-03-06 DIAGNOSIS — S81802A Unspecified open wound, left lower leg, initial encounter: Secondary | ICD-10-CM | POA: Diagnosis not present

## 2020-03-29 ENCOUNTER — Other Ambulatory Visit (HOSPITAL_COMMUNITY): Payer: Self-pay | Admitting: Physician Assistant

## 2020-03-30 ENCOUNTER — Encounter (HOSPITAL_BASED_OUTPATIENT_CLINIC_OR_DEPARTMENT_OTHER): Payer: Medicare Other | Attending: Internal Medicine | Admitting: Internal Medicine

## 2020-03-30 ENCOUNTER — Other Ambulatory Visit: Payer: Self-pay

## 2020-03-30 DIAGNOSIS — L97822 Non-pressure chronic ulcer of other part of left lower leg with fat layer exposed: Secondary | ICD-10-CM | POA: Diagnosis not present

## 2020-03-30 DIAGNOSIS — Z7901 Long term (current) use of anticoagulants: Secondary | ICD-10-CM | POA: Insufficient documentation

## 2020-03-30 DIAGNOSIS — Z86718 Personal history of other venous thrombosis and embolism: Secondary | ICD-10-CM | POA: Insufficient documentation

## 2020-03-30 DIAGNOSIS — I129 Hypertensive chronic kidney disease with stage 1 through stage 4 chronic kidney disease, or unspecified chronic kidney disease: Secondary | ICD-10-CM | POA: Insufficient documentation

## 2020-03-30 DIAGNOSIS — W228XXA Striking against or struck by other objects, initial encounter: Secondary | ICD-10-CM | POA: Diagnosis not present

## 2020-03-30 DIAGNOSIS — I872 Venous insufficiency (chronic) (peripheral): Secondary | ICD-10-CM | POA: Diagnosis not present

## 2020-03-30 DIAGNOSIS — S8012XA Contusion of left lower leg, initial encounter: Secondary | ICD-10-CM | POA: Insufficient documentation

## 2020-03-30 DIAGNOSIS — S81812A Laceration without foreign body, left lower leg, initial encounter: Secondary | ICD-10-CM | POA: Diagnosis not present

## 2020-03-30 DIAGNOSIS — N183 Chronic kidney disease, stage 3 unspecified: Secondary | ICD-10-CM | POA: Insufficient documentation

## 2020-03-30 DIAGNOSIS — Z96653 Presence of artificial knee joint, bilateral: Secondary | ICD-10-CM | POA: Diagnosis not present

## 2020-03-30 DIAGNOSIS — M47816 Spondylosis without myelopathy or radiculopathy, lumbar region: Secondary | ICD-10-CM | POA: Diagnosis not present

## 2020-03-30 DIAGNOSIS — I480 Paroxysmal atrial fibrillation: Secondary | ICD-10-CM | POA: Insufficient documentation

## 2020-03-30 DIAGNOSIS — I70242 Atherosclerosis of native arteries of left leg with ulceration of calf: Secondary | ICD-10-CM | POA: Diagnosis not present

## 2020-03-30 DIAGNOSIS — J449 Chronic obstructive pulmonary disease, unspecified: Secondary | ICD-10-CM | POA: Insufficient documentation

## 2020-03-30 DIAGNOSIS — L97222 Non-pressure chronic ulcer of left calf with fat layer exposed: Secondary | ICD-10-CM | POA: Diagnosis not present

## 2020-03-30 NOTE — Progress Notes (Signed)
Judith Boone ([id number]) Visit Report for 03/30/2020 Abuse/Suicide Risk Screen Details Patient Name: Date of Service: [name] 03/30/2020 2:45 PM Medical Record Number: [id number] Patient Account Number: [id number] Date of Birth/Sex: Treating RN: 19-Jul-1931 (86 y.o. Judith Boone Primary Care Judith Boone: Judith Boone Other Clinician: Referring Judith Boone: Treating Judith Boone/Extender: Judith Boone in Treatment: 0 Abuse/Suicide Risk Screen Items Answer ABUSE RISK SCREEN: Has anyone close to you tried to hurt Judith harm you recentlyo No Do you feel uncomfortable with anyone in your familyo No Has anyone forced you do things that you didnt want to doo No Electronic Signature(s) Signed: 03/30/2020 5:16:12 PM By: Carlene Coria RN Entered By: Carlene Coria on 03/30/2020 14:48:25 -------------------------------------------------------------------------------- Activities of Daily Living Details Patient Name: Date of Service: Judith Boone 03/30/2020 2:45 PM Medical Record Number: [id number] Patient Account Number: [id number] Date of Birth/Sex: Treating RN: 1931-11-15 (86 y.o. Judith Boone Primary Care Judith Boone: Judith Boone Other Clinician: Referring Judith Boone: Treating Judith Boone/Extender: Judith Boone in Treatment: 0 Activities of Daily Living Items Answer Activities of Daily Living (Please select one for each item) Drive Automobile Completely Able T Medications ake Completely Able Use T elephone Completely Able Care for Appearance Completely Able Use T oilet Completely Able Bath / Shower Completely Able Dress Self Completely Able Feed Self Completely Able Walk Completely Able Get In / Out Bed Completely Able Housework Completely Able Prepare Meals Completely [place] Completely Able Shop for Self Completely Able Electronic Signature(s) Signed: 03/30/2020 5:16:12 PM By: Carlene Coria RN Entered By: Carlene Coria  on 03/30/2020 14:49:14 -------------------------------------------------------------------------------- Education Screening Details Patient Name: Date of Service: Judith Boone 03/30/2020 2:45 PM Medical Record Number: [id number] Patient Account Number: [id number] Date of Birth/Sex: Treating RN: 1931/05/29 (86 y.o. Judith Boone Primary Care Judith Boone: Judith Boone Other Clinician: Referring Judith Boone: Treating Judith Boone/Extender: Judith Boone in Treatment: 0 Learning Preferences/Education Level/Primary Language Learning Preference: Explanation Highest Education Level: College Judith Above Preferred Language: English Cognitive Barrier Language Barrier: No Translator Needed: No Memory Deficit: No Emotional Barrier: No Cultural/Religious Beliefs Affecting Medical Care: No Physical Barrier Impaired Vision: Yes Glasses Impaired Hearing: Yes Hearing Aid, bi lat Knowledge/Comprehension Knowledge Level: Medium Comprehension Level: High Ability to understand written instructions: High Ability to understand verbal instructions: High Motivation Anxiety Level: Anxious Cooperation: Cooperative Education Importance: Acknowledges Need Interest in Health Problems: Asks Questions Perception: Coherent Willingness to Engage in Self-Management High Activities: Readiness to Engage in Self-Management High Activities: Electronic Signature(s) Signed: 03/30/2020 5:16:12 PM By: Carlene Coria RN Entered By: Carlene Coria on 03/30/2020 14:50:03 -------------------------------------------------------------------------------- Fall Risk Assessment Details Patient Name: Date of Service: Judith Boone. 03/30/2020 2:45 PM Medical Record Number: [id number] Patient Account Number: [id number] Date of Birth/Sex: Treating RN: 26-Jul-1931 (86 y.o. Judith Boone Primary Care Judith Boone: Judith Boone Other Clinician: Referring Judith Boone: Treating Judith Boone/Extender: Judith Boone in Treatment: 0 Fall Risk Assessment Items Have you had 2 Judith more falls in the last 12 monthso 0 No Have you had any fall that resulted in injury in the last 12 monthso 0 No FALLS RISK SCREEN History of falling - immediate Judith within 3 months 0 No Secondary diagnosis (Do you have 2 Judith more medical diagnoseso) 0 No Ambulatory aid None/bed rest/wheelchair/nurse 0 No Crutches/cane/walker 0 No Furniture 0 No Intravenous therapy Access/Saline/Heparin Lock 0 No Gait/Transferring Normal/ bed rest/ wheelchair 0 No Weak (short steps with Judith without shuffle, stooped  but able to lift head while walking, may seek 0 No support from furniture) Impaired (short steps with shuffle, may have difficulty arising from chair, head down, impaired 0 No balance) Mental Status Oriented to own ability 0 No Electronic Signature(s) Signed: 03/30/2020 5:16:12 PM By: Carlene Coria RN Entered By: Carlene Coria on 03/30/2020 14:50:52 -------------------------------------------------------------------------------- Foot Assessment Details Patient Name: Date of Service: Judith Boone 03/30/2020 2:45 PM Medical Record Number: [id number] Patient Account Number: [id number] Date of Birth/Sex: Treating RN: 1931/06/18 (86 y.o. Judith Boone Primary Care Shital Crayton: Judith Boone Other Clinician: Referring Judith Boone: Treating Judith Boone/Extender: Judith Boone in Treatment: 0 Foot Assessment Items Site Locations + = Sensation present, - = Sensation absent, C = Callus, U = Ulcer R = Redness, W = Warmth, M = Maceration, PU = Pre-ulcerative lesion F = Fissure, S = Swelling, D = Dryness Assessment Right: Left: Other Deformity: No No Prior Foot Ulcer: No No Prior Amputation: No No Charcot Joint: No No Ambulatory Status: Ambulatory Without Help Gait: Steady Electronic Signature(s) Signed: 03/30/2020 5:16:12 PM By: Carlene Coria RN Entered By: Carlene Coria on  03/30/2020 14:53:02 -------------------------------------------------------------------------------- Nutrition Risk Screening Details Patient Name: Date of Service: Judith Boone 03/30/2020 2:45 PM Medical Record Number: [id number] Patient Account Number: [id number] Date of Birth/Sex: Treating RN: 1932/01/09 (86 y.o. Judith Boone Primary Care Jacqueleen Pulver: Judith Boone Other Clinician: Referring Darrien Belter: Treating Daelynn Blower/Extender: Judith Boone in Treatment: 0 Height (in): 60 Weight (lbs): 138 Body Mass Index (BMI): 26.9 Nutrition Risk Screening Items Score Screening NUTRITION RISK SCREEN: I have an illness Judith condition that made me change the kind and/Judith amount of food I eat 0 No I eat fewer than two meals per day 0 No I eat few fruits and vegetables, Judith milk products 0 No I have three Judith more drinks of beer, liquor Judith wine almost every day 0 No I have tooth Judith mouth problems that make it hard for me to eat 0 No I don't always have enough money to buy the food I need 0 No I eat alone most of the time 0 No I take three Judith more different prescribed Judith over-the-counter drugs a day 1 Yes Without wanting to, I have lost Judith gained 10 pounds in the last six months 0 No I am not always physically able to shop, cook and/Judith feed myself 0 No Nutrition Protocols Good Risk Protocol 0 No interventions needed Moderate Risk Protocol High Risk Proctocol Risk Level: Good Risk Score: 1 Electronic Signature(s) Signed: 03/30/2020 5:16:12 PM By: Carlene Coria RN Entered By: Carlene Coria on 03/30/2020 14:51:31

## 2020-03-30 NOTE — Progress Notes (Signed)
Judith, Boone ([id number]) Visit Report for 03/30/2020 Chief Complaint Document Details Patient Name: Date of Service: [name] 03/30/2020 2:45 PM Medical Record Number: [id number] Patient Account Number: [id number] Date of Birth/Sex: Treating RN: 07-02-1931 (86 y.o. Judith Boone Primary Care Provider: Velna Hatchet Other Clinician: Referring Provider: Treating Provider/Extender: Wendall Mola in Treatment: 0 Information Obtained from: Patient Chief Complaint She is here in follow up evaluation for a RLE ulcer 03/30/2020; patient is here for review of the wound on her left posterior calf Electronic Signature(s) Signed: 03/30/2020 5:12:12 PM By: Linton Ham MD Entered By: Linton Ham on 03/30/2020 16:11:15 -------------------------------------------------------------------------------- Debridement Details Patient Name: Date of Service: Judith Boone. 03/30/2020 2:45 PM Medical Record Number: [id number] Patient Account Number: [id number] Date of Birth/Sex: Treating RN: 1931-04-29 (86 y.o. Judith Boone Primary Care Provider: Velna Hatchet Other Clinician: Referring Provider: Treating Provider/Extender: Wendall Mola in Treatment: 0 Debridement Performed for Assessment: Wound #6 Left,Posterior Lower Leg Performed By: Physician Ricard Dillon., MD Debridement Type: Debridement Level of Consciousness (Pre-procedure): Awake and Alert Pre-procedure Verification/Time Out Yes - 15:30 Taken: Start Time: 15:30 Pain Control: Other : benzocaine 20% spray T Area Debrided (L x W): otal 1 (cm) x 0.9 (cm) = 0.9 (cm) Tissue and other material debrided: Viable, Non-Viable, Slough, Subcutaneous, Skin: Dermis , Skin: Epidermis, Slough Level: Skin/Subcutaneous Tissue Debridement Description: Excisional Instrument: Forceps, Scissors Bleeding: Minimum Hemostasis Achieved: Pressure End Time: 15:35 Procedural Pain:  8 Post Procedural Pain: 4 Response to Treatment: Procedure was tolerated well Level of Consciousness (Post- Awake and Alert procedure): Post Debridement Measurements of Total Wound Length: (cm) 1 Width: (cm) 0.9 Depth: (cm) 0.6 Volume: (cm) 0.424 Character of Wound/Ulcer Post Debridement: Improved Post Procedure Diagnosis Same as Pre-procedure Electronic Signature(s) Signed: 03/30/2020 5:01:53 PM By: Baruch Gouty RN, BSN Signed: 03/30/2020 5:12:12 PM By: Linton Ham MD Entered By: Linton Ham on 03/30/2020 16:10:55 -------------------------------------------------------------------------------- HPI Details Patient Name: Date of Service: Judith Boone. 03/30/2020 2:45 PM Medical Record Number: [id number] Patient Account Number: [id number] Date of Birth/Sex: Treating RN: April 16, 1932 (86 y.o. Judith Boone Primary Care Provider: Velna Hatchet Other Clinician: Referring Provider: Treating Provider/Extender: Wendall Mola in Treatment: 0 History of Present Illness Location: left anterior shin to wounds about 2 months ago and on the left lateral calf 1 wound about 1 week ago Quality: Patient reports experiencing a dull pain to affected area(s). Severity: Patient states wound(s) are getting worse. Duration: Patient has had the wound for > 2 months prior to seeking treatment at the wound center Timing: Pain in wound is Intermittent (comes and goes Context: The wound occurred when the patient had a blunt object hit her anterior shin and she tore her skin on the left lateral calf while she was wearing her trousers Modifying Factors: Patient wound(s)/ulcer(s) are worsening due to :swelling and redness and discharge ssociated Signs and Symptoms: Patient reports having increase swelling. A HPI Description: 86 year old patient was seen at her PCPs office at [place] for her left shin injury about a month ago. was seen in the ER for  redness and drainage. She was started on Keflex 4 times a day and was then seen in follow-up in the office. Past medical history for GERD, right sciatica, COPD, DVT left lower extremity in May 2016. Also known to have a left femoral artery occlusion with collaterals and was seen in May 2016 by [facility]. He is status  post bilateral knee arthroplasty, appendectomy, TAH/BSO in 2000. The patient was seen again in follow-up on 12/12/2016 with difficulty to heal wound due to peripheral artery disease and venous disease and there started on doxycycline 100 mg twice a day for 10 days and referred her to the wound center for an opinion. the patient has had a right lower extremity venous Doppler ultrasound and no evidence of DVT was found in the right lower extremity in July 2018. in May 2016 she had a lower extremity venous duplex evaluation done and there was deep venous reflux observed throughout the left lower extremity but there was no saphenous reflux. There was also focal deep vein thrombosis in one of the paired posterior tibial veins in the left calf. lower arterial study done in September 2015 showed right ABI of 1.08 and the left ABI of 0.87 with right toe brachial index of 0.71 in left toe brachial index of 0.87. 12/31/2016 -- had a lower extremity venous duplex reflux evaluation done which shows no evidence of bilateral lower extremity DVT There was reflux in . bilateral common femoral veins and left popliteal vein. Reflux in the right saphenofemoral junction and proximal great saphenous vein and in the great saphenous vein at the medial knee and proximal calf. Reflux in the right small saphenous vein midsegment. Reflux in the left saphenofemoral junction only. No reflux observed in the great saphenous vein and small saphenous vein. lower extremity arterial study done showed a left ABI -- 0.76 suggestive of moderate arterial occlusive disease and the right ABI -- 0.93 was within normal limits. toe  brachial indices were 0.58 on the right and 0.50 on the left which were both abnormal. 01/07/2017 -- her PCP manage to get her an appointment to see Dr. Donnetta Hutching, tomorrow. Dr. Ardeth Perfect also said she should not be on any Lasix 01/14/2017 -- was seen by Dr. Althea Charon -- who reviewed the data which showed she had reflux in the right leg in the common femoral vein and femoral vein odd and on the left side throughout the common femoral vein and femoral vein and popliteal vein. Arterial studies revealed normal ABIs on the right and a reduced ABI on the left with biphasic waveform. He did not find any correctable superficial reflux and she did have mild arterial insufficiency but felt that it was adequate flow for healing these venous stasis ulcers without revascularization attempts. If she had progressive tissue loss she could undergo arteriography. He would see her back as needed 05/05/17 READMISSION This is a patient who is seen here earlier in the year for a wound on the left anterior lower leg. She had reflux studies and arterial studies done at that time and she saw Dr. early. Dr. Tawni Millers did not feel she had correctable superficial reflux. As far as arterial studies were concerned she had a left ABI of 0.76 a right ABI of 0.93 a TBI on the left at 0.50 and on the right at 0.5 date. She was felt to have mild arterial disease but blood flow was felt to be adequate for healing. The patient had a fall on November 30 causing wounds on the right lateral lower leg and the right mid humeral level. She is simply been covering these although recently she was put on cephalexin by her primary physician. She is here for review of these wounds. She has been compliant with the compression stockings that were ordered during her last admission here. These are 20-30 mm 05/12/17; we put Santyl on  her right leg wound and wrapped her and the wound actually looks quite a bit healthier today. There is been very significant  reduction in the skin tear in her right mid humerus. We xeroform The patient requests not to have her leg wrap this week, she has 20-30 mm compression stockings and would like me to prescribe Santyl so she can change the dressing even though I clearly explained it is likely we'll be changing to silver collagen or Hydrofera Blue next week 05/19/17; patient is changing her own dressing using Santyl on the right leg and Xeroform on the arm the remaining small areas on the arm are looking epithelialized I don't think the Xeroform is necessary simply cover with a foam border. Still using Santyl to the right leg. No change in dimensions 05/26/17; the patient's wound on her arm has closed over. Changed her to [place] last week from [place]. She still has a non viable surface today unfortunately 06/02/17; the patient's right arm wound remains closed over. Changed her to [facility] last week after a vigorous debridement of the right anterior lower leg wound. Surface looks better 06/09/17; the patient's right arm remains closed. She is been on Prisma now for 2 weeks with hydrogel. 06/16/17; traumatic wound on the right anterior leg. I switched 3 weeks ago to [facility] from [place] although we've made no real progress. Still a gritty surface. I changed her to Iodoflex today. She is still using her on 20-30 mm stockings 06/23/17; traumatic wound on the right anterior leg in the setting of chronic venous insufficiency. Changed her to Iodoflex last week because of continued need for surface debridement. She arrives today with a better-looking wound surface. She is using her own compression stockings 06/30/17; traumatic wound on the right anterior leg in the setting of chronic venous insufficiency. Much better-looking wound surface. Will change to [facility] today. She is using her own compression stockings and the edema control looks adequate 07/07/17; traumatic wound on the right anterior leg in the setting of chronic venous  insufficiency. Slightly smaller today using Hydrofera Blue 07/14/17-she is here for evaluation for right lower extremity ulcer. There is improvement in measurements and appearance. We will continue with same treatment plan and follow-up next week 07/23/17; right lower extremity ulcer. Continued improvement in measurements in appearance. Under the bright light there is still some surface slough although I elected not to debrided this today as the dimensions are significantly improved. We use Hydrofera Blue 08/04/17; right lower extremity traumatic ulcer in the setting of chronic venous insufficiency. Somewhat surprisingly she is healed today. Totally epithelialized. She has 20-30 mm compression stockings READMISSION 03/30/2020 Judith Boone is a now an 86 year old woman who was in this clinic in 2018 and then for 3 months in 2019 at which time she had had a fall and had a wound on her right leg. She was discharged with 20/30 stockings. She tells me that her problem here began in September [place] rubbing the posterior left calf on the steps. She came home with a "large bruise". This eventually opened. She is on Eliquis. She was seen by her primary on 01/29/2020 at which time she had left leg swelling a DVT rule out was negative she was given doxycycline. There was no mention of an actual wound. At some point this area actually opened. She simply been applying a Band-Aid over it currently and presented with 4 small interconnecting wounds on the posterior  calf however there was 0.6 cm in depth and undermining. Past medical history; chronic venous insufficiency, paroxysmal A. fib on Eliquis, COPD, lumbar spondylosis, hyperlipidemia, chronic renal failure stage III hypertension ABI in our clinic was 0.52. Her last noninvasive studies were in 2018. At that point on the left her ABI was 0.76 her TBI was 0.50. She had a extensive evaluation of her vascular  status in September 2018 by Dr. Darene Lamer early. It was generally felt that she had enough blood flow to heal the predominantly venous odd ulcers on her left lower leg. He was also noted that she had very significant venous hypertension related to deep venous reflux. At that point she did not have any correctable superficial reflux. Dr. Donnetta Hutching assessed her dorsalis pedis and posterior tibial with a hand-held Doppler and he felt that her flow was good Electronic Signature(s) Signed: 03/30/2020 5:12:12 PM By: Linton Ham MD Entered By: Linton Ham on 03/30/2020 16:20:15 -------------------------------------------------------------------------------- Physical Exam Details Patient Name: Date of Service: Judith Boone 03/30/2020 2:45 PM Medical Record Number: [id number] Patient Account Number: [id number] Date of Birth/Sex: Treating RN: 11/18/1931 (86 y.o. Judith Boone Primary Care Provider: Velna Hatchet Other Clinician: Referring Provider: Treating Provider/Extender: Wendall Mola in Treatment: 0 Constitutional Sitting or standing Blood Pressure is within target range for patient.. Pulse regular and within target range for patient.Marland Kitchen Respirations regular, non-labored and within target range.. Temperature is normal and within the target range for the patient.Marland Kitchen Appears in no distress. Cardiovascular Pedal pulses on the left were not palpable but her foot was warm. There is localized swelling around the wound minimal drainage and edema in the left lower leg. Integumentary (Hair, Skin) No surrounding erythema around the wound. Psychiatric appears at normal baseline. Notes Wound exam; left posterior mid calf for small interconnecting areas covered by skin. This was not going to be a viable surface. I used pickups and a #15 blade to separate the skin and expose the wound. There was debris on the bottom of this that I also removed as much as possible being cautious  because she was on Eliquis. Hemostasis with direct pressure Electronic Signature(s) Signed: 03/30/2020 5:12:12 PM By: Linton Ham MD Entered By: Linton Ham on 03/30/2020 16:21:36 -------------------------------------------------------------------------------- Physician Orders Details Patient Name: Date of Service: Judith Boone 03/30/2020 2:45 PM Medical Record Number: [id number] Patient Account Number: [id number] Date of Birth/Sex: Treating RN: Jul 22, 1931 (86 y.o. Judith Boone Primary Care Provider: Velna Hatchet Other Clinician: Referring Provider: Treating Provider/Extender: Wendall Mola in Treatment: 0 Verbal / Phone Orders: No Diagnosis Coding Follow-up Appointments Return Appointment in 1 week. Bathing/ Shower/ Hygiene May shower with protection but do not get wound dressing(s) wet. - may use cast protector to cover wrap to shower Edema Control - Lymphedema / SCD / Other Bilateral Lower Extremities Elevate legs to the level of the heart or above for 30 minutes daily and/or when sitting, a frequency of: Avoid standing for long periods of time. Exercise regularly Wound Treatment Wound #6 - Lower Leg Wound Laterality: Left, Posterior Peri-Wound Care: Sween Lotion (Moisturizing lotion) 1 x Per Week Discharge Instructions: Apply moisturizing lotion as directed Prim Dressing: IODOFLEX 0.9% Cadexomer Iodine Pad 4x6 cm 1 x Per Week ary Discharge Instructions: Apply to wound bed as instructed Secondary Dressing: Woven Gauze Sponge, Non-Sterile 4x4 in 1 x Per Week Discharge Instructions: Apply over primary dressing as directed. Compression Wrap: Kerlix Roll 4.5x3.1 (in/yd) 1 x Per Week Discharge  Instructions: Apply Kerlix and Coban compression as directed. Compression Wrap: Coban Self-Adherent Wrap 4x5 (in/yd) 1 x Per Week Discharge Instructions: Apply over Kerlix as directed. NOT TOO TIGHT!! Patient Medications llergies: bacitracin,  Neosporin (neo-bac-polym), Polysporin A Notifications Medication Indication Start End prior to debridement 03/30/2020 benzocaine DOSE topical 20 % aerosol - aerosol topical Electronic Signature(s) Signed: 03/30/2020 5:01:53 PM By: Baruch Gouty RN, BSN Signed: 03/30/2020 5:12:12 PM By: Linton Ham MD Entered By: Baruch Gouty on 03/30/2020 15:38:30 -------------------------------------------------------------------------------- Problem List Details Patient Name: Date of Service: Judith Boone. 03/30/2020 2:45 PM Medical Record Number: [id number] Patient Account Number: [id number] Date of Birth/Sex: Treating RN: May 21, 1931 (86 y.o. Judith Boone Primary Care Provider: Velna Hatchet Other Clinician: Referring Provider: Treating Provider/Extender: Wendall Mola in Treatment: 0 Active Problems ICD-10 Encounter Code Description Active Date MDM Diagnosis S80.12XD Contusion of left lower leg, subsequent encounter 03/30/2020 No Yes L97.222 Non-pressure chronic ulcer of left calf with fat layer exposed 03/30/2020 No Yes I70.242 Atherosclerosis of native arteries of left leg with ulceration of calf 03/30/2020 No Yes Inactive Problems Resolved Problems Electronic Signature(s) Signed: 03/30/2020 5:12:12 PM By: Linton Ham MD Entered By: Linton Ham on 03/30/2020 15:41:24 -------------------------------------------------------------------------------- Progress Note Details Patient Name: Date of Service: Judith Boone 03/30/2020 2:45 PM Medical Record Number: [id number] Patient Account Number: [id number] Date of Birth/Sex: Treating RN: 06-17-1931 (86 y.o. Judith Boone Primary Care Provider: Velna Hatchet Other Clinician: Referring Provider: Treating Provider/Extender: Wendall Mola in Treatment: 0 Subjective Chief Complaint Information obtained from Patient She is here in follow up evaluation for a RLE ulcer  03/30/2020; patient is here for review of the wound on her left posterior calf History of Present Illness (HPI) The following HPI elements were documented for the patient's wound: Location: left anterior shin to wounds about 2 months ago and on the left lateral calf 1 wound about 1 week ago Quality: Patient reports experiencing a dull pain to affected area(s). Severity: Patient states wound(s) are getting worse. Duration: Patient has had the wound for > 2 months prior to seeking treatment at the wound center Timing: Pain in wound is Intermittent (comes and goes Context: The wound occurred when the patient had a blunt object hit her anterior shin and she tore her skin on the left lateral calf while she was wearing her trousers Modifying Factors: Patient wound(s)/ulcer(s) are worsening due to :swelling and redness and discharge Associated Signs and Symptoms: Patient reports having increase swelling. 86 year old patient was seen at her PCPs office at [place] for her left shin injury about a month ago. was seen in the ER for redness and drainage. She was started on Keflex 4 times a day and was then seen in follow-up in the office. Past medical history for GERD, right sciatica, COPD, DVT left lower extremity in May 2016. Also known to have a left femoral artery occlusion with collaterals and was seen in May 2016 by [facility]. He is status post bilateral knee arthroplasty, appendectomy, TAH/BSO in 2000. The patient was seen again in follow-up on 12/12/2016 with difficulty to heal wound due to peripheral artery disease and venous disease and there started on doxycycline 100 mg twice a day for 10 days and referred her to the wound center for an opinion. the patient has had a right lower extremity venous Doppler ultrasound and no evidence of DVT was found in the right lower extremity in July 2018. in May 2016 she had a  lower extremity venous duplex evaluation done and there was deep venous  reflux observed throughout the left lower extremity but there was no saphenous reflux. There was also focal deep vein thrombosis in one of the paired posterior tibial veins in the left calf. lower arterial study done in September 2015 showed right ABI of 1.08 and the left ABI of 0.87 with right toe brachial index of 0.71 in left toe brachial index of 0.87. 12/31/2016 -- had a lower extremity venous duplex reflux evaluation done which shows no evidence of bilateral lower extremity DVT There was reflux in . bilateral common femoral veins and left popliteal vein. Reflux in the right saphenofemoral junction and proximal great saphenous vein and in the great saphenous vein at the medial knee and proximal calf. Reflux in the right small saphenous vein midsegment. Reflux in the left saphenofemoral junction only. No reflux observed in the great saphenous vein and small saphenous vein. lower extremity arterial study done showed a left ABI -- 0.76 suggestive of moderate arterial occlusive disease and the right ABI -- 0.93 was within normal limits. toe brachial indices were 0.58 on the right and 0.50 on the left which were both abnormal. 01/07/2017 -- her PCP manage to get her an appointment to see Dr. Donnetta Hutching, tomorrow. Dr. Ardeth Perfect also said she should not be on any Lasix 01/14/2017 -- was seen by Dr. Althea Charon -- who reviewed the data which showed she had reflux in the right leg in the common femoral vein and femoral vein odd and on the left side throughout the common femoral vein and femoral vein and popliteal vein. Arterial studies revealed normal ABIs on the right and a reduced ABI on the left with biphasic waveform. He did not find any correctable superficial reflux and she did have mild arterial insufficiency but felt that it was adequate flow for healing these venous stasis ulcers without revascularization attempts. If she had progressive tissue loss she could undergo arteriography. He would see her back  as needed 05/05/17 READMISSION This is a patient who is seen here earlier in the year for a wound on the left anterior lower leg. She had reflux studies and arterial studies done at that time and she saw Dr. early. Dr. Tawni Millers did not feel she had correctable superficial reflux. As far as arterial studies were concerned she had a left ABI of 0.76 a right ABI of 0.93 a TBI on the left at 0.50 and on the right at 0.5 date. She was felt to have mild arterial disease but blood flow was felt to be adequate for healing. The patient had a fall on November 30 causing wounds on the right lateral lower leg and the right mid humeral level. She is simply been covering these although recently she was put on cephalexin by her primary physician. She is here for review of these wounds. She has been compliant with the compression stockings that were ordered during her last admission here. These are 20-30 mm 05/12/17; we put Santyl on her right leg wound and wrapped her and the wound actually looks quite a bit healthier today. There is been very significant reduction in the skin tear in her right mid humerus. We xeroform The patient requests not to have her leg wrap this week, she has 20-30 mm compression stockings and would like me to prescribe Santyl so she can change the dressing even though I clearly explained it is likely we'll be changing to silver collagen or Hydrofera Blue next week 05/19/17;  patient is changing her own dressing using Santyl on the right leg and Xeroform on the arm the remaining small areas on the arm are looking epithelialized I don't think the Xeroform is necessary simply cover with a foam border. Still using Santyl to the right leg. No change in dimensions 05/26/17; the patient's wound on her arm has closed over. Changed her to [place] last week from [place]. She still has a non viable surface today unfortunately 06/02/17; the patient's right arm wound remains closed over. Changed her to [facility] last  week after a vigorous debridement of the right anterior lower leg wound. Surface looks better 06/09/17; the patient's right arm remains closed. She is been on Prisma now for 2 weeks with hydrogel. 06/16/17; traumatic wound on the right anterior leg. I switched 3 weeks ago to [facility] from [place] although we've made no real progress. Still a gritty surface. I changed her to Iodoflex today. She is still using her on 20-30 mm stockings 06/23/17; traumatic wound on the right anterior leg in the setting of chronic venous insufficiency. Changed her to Iodoflex last week because of continued need for surface debridement. She arrives today with a better-looking wound surface. She is using her own compression stockings 06/30/17; traumatic wound on the right anterior leg in the setting of chronic venous insufficiency. Much better-looking wound surface. Will change to [facility] today. She is using her own compression stockings and the edema control looks adequate 07/07/17; traumatic wound on the right anterior leg in the setting of chronic venous insufficiency. Slightly smaller today using Hydrofera Blue 07/14/17-she is here for evaluation for right lower extremity ulcer. There is improvement in measurements and appearance. We will continue with same treatment plan and follow-up next week 07/23/17; right lower extremity ulcer. Continued improvement in measurements in appearance. Under the bright light there is still some surface slough although I elected not to debrided this today as the dimensions are significantly improved. We use Hydrofera Blue 08/04/17; right lower extremity traumatic ulcer in the setting of chronic venous insufficiency. Somewhat surprisingly she is healed today. T otally epithelialized. She has 20-30 mm compression stockings READMISSION 03/30/2020 Judith Boone is a now an 86 year old woman who was in this clinic in 2018 and then for 3 months in 2019 at which time she had had a fall and had a wound  on her right leg. She was discharged with 20/30 stockings. She tells me that her problem here began in September [place] apparently rubbing the posterior left calf on the steps. She came home with a "large bruise". This eventually opened. She is on Eliquis. She was seen by her primary on 01/29/2020 at which time she had left leg swelling a DVT rule out was negative she was given doxycycline. There was no mention of an actual wound. At some point this area actually opened. She simply been applying a Band-Aid over it currently and presented with 4 small interconnecting wounds on the posterior calf however there was 0.6 cm in depth and undermining. Past medical history; chronic venous insufficiency, paroxysmal A. fib on Eliquis, COPD, lumbar spondylosis, hyperlipidemia, chronic renal failure stage III hypertension ABI in our clinic was 0.52. Her last noninvasive studies were in 2018. At that point on the left her ABI was 0.76 her TBI was 0.50. She had a extensive evaluation of her vascular status in September 2018 by Dr. Darene Lamer early. It was generally felt that she had enough blood  flow to heal the predominantly venous odd ulcers on her left lower leg. He was also noted that she had very significant venous hypertension related to deep venous reflux. At that point she did not have any correctable superficial reflux. Dr. Donnetta Hutching assessed her dorsalis pedis and posterior tibial with a hand-held Doppler and he felt that her flow was good Patient History Information obtained from Patient. Allergies bacitracin, Neosporin (neo-bac-polym), Polysporin Family History Heart Disease - Father, Hypertension - Father,Children, No family history of Cancer, Diabetes, Hereditary Spherocytosis, Seizures, Stroke, Thyroid Problems, Tuberculosis. Social History Former smoker - Quit in 1981, Marital Status - Widowed, Alcohol Use - Rarely - wine, Drug Use - No History, Caffeine Use  - Never. Medical History Eyes Patient has history of Cataracts Denies history of Glaucoma, Optic Neuritis Ear/Nose/Mouth/Throat Denies history of Chronic sinus problems/congestion, Middle ear problems Hematologic/Lymphatic Patient has history of Lymphedema Denies history of Anemia, Hemophilia, Human Immunodeficiency Virus Respiratory Denies history of Aspiration, Asthma, Chronic Obstructive Pulmonary Disease (COPD), Pneumothorax, Sleep Apnea, Tuberculosis Cardiovascular Patient has history of Hypertension, Peripheral Arterial Disease Denies history of Angina, Arrhythmia, Congestive Heart Failure, Coronary Artery Disease, Deep Vein Thrombosis, Hypotension, Myocardial Infarction, Peripheral Venous Disease, Phlebitis, Vasculitis Gastrointestinal Denies history of Cirrhosis , Colitis, Crohnoos, Hepatitis A, Hepatitis B, Hepatitis C Endocrine Denies history of Type I Diabetes, Type II Diabetes Genitourinary Denies history of End Stage Renal Disease Immunological Denies history of Lupus Erythematosus, Raynaudoos, Scleroderma Integumentary (Skin) Denies history of History of Burn Musculoskeletal Patient has history of Osteoarthritis Denies history of Gout, Rheumatoid Arthritis Neurologic Denies history of Dementia, Neuropathy, Quadriplegia, Paraplegia, Seizure Disorder Oncologic Denies history of Received Chemotherapy, Received Radiation Psychiatric Denies history of Anorexia/bulimia, Confinement Anxiety Hospitalization/Surgery History - Abdominal Hysterectomy. - L Broken Ankle Repair. - Cataract Extraction. - R T Knee Arthroplasty. otal Medical A Surgical History Notes nd Ear/Nose/Mouth/Throat Hearing Loss Hematologic/Lymphatic Hyperlipidemia sepsis Gastrointestinal GERD Esophageal Web Genitourinary Renal Insufficiency CKD Stage 3 Integumentary (Skin) Cellulitis Musculoskeletal Unspecified Arthritis Review of Systems (ROS) Constitutional Symptoms (General  Health) Denies complaints or symptoms of Fatigue, Fever, Chills, Marked Weight Change. Eyes Complains or has symptoms of Glasses / Contacts. Denies complaints or symptoms of Dry Eyes, Vision Changes. Ear/Nose/Mouth/Throat Denies complaints or symptoms of Chronic sinus problems or rhinitis. Respiratory Denies complaints or symptoms of Chronic or frequent coughs, Shortness of Breath. Cardiovascular Denies complaints or symptoms of Chest pain. Gastrointestinal Denies complaints or symptoms of Frequent diarrhea, Nausea, Vomiting. Endocrine Denies complaints or symptoms of Heat/cold intolerance. Genitourinary Denies complaints or symptoms of Frequent urination. Integumentary (Skin) Complains or has symptoms of Wounds. Musculoskeletal Denies complaints or symptoms of Muscle Pain, Muscle Weakness. Neurologic Denies complaints or symptoms of Numbness/parasthesias. Psychiatric Denies complaints or symptoms of Claustrophobia, Suicidal. Objective Constitutional Sitting or standing Blood Pressure is within target range for patient.. Pulse regular and within target range for patient.Marland Kitchen Respirations regular, non-labored and within target range.. Temperature is normal and within the target range for the patient.Marland Kitchen Appears in no distress. Vitals Time Taken: 2:44 PM, Height: 60 in, Source: Stated, Weight: 138 lbs, BMI: 26.9, Temperature: 98.2 F, Pulse: 102 bpm, Respiratory Rate: 18 breaths/min, Blood Pressure: 135/76 mmHg. Cardiovascular Pedal pulses on the left were not palpable but her foot was warm. There is localized swelling around the wound minimal drainage and edema in the left lower leg. Psychiatric appears at normal baseline. General Notes: Wound exam; left posterior mid calf for small interconnecting areas covered by skin. This was not going to be a viable surface. I used  pickups and a #15 blade to separate the skin and expose the wound. There was debris on the bottom of this that I also  removed as much as possible being cautious because she was on Eliquis. Hemostasis with direct pressure Integumentary (Hair, Skin) No surrounding erythema around the wound. Wound #6 status is Open. Original cause of wound was Trauma. The wound is located on the Left,Posterior Lower Leg. The wound measures 1cm length x 0.9cm width x 0.6cm depth; 0.707cm^2 area and 0.424cm^3 volume. There is Fat Layer (Subcutaneous Tissue) exposed. There is no tunneling or undermining noted. There is a medium amount of serosanguineous drainage noted. There is small (1-33%) red granulation within the wound bed. There is a large (67-100%) amount of necrotic tissue within the wound bed including Adherent Slough. 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 There was a Excisional Skin/Subcutaneous Tissue Debridement with a total area of 0.9 sq cm performed by Ricard Dillon., MD. With the following instrument(s): Forceps, and Scissors to remove Viable and Non-Viable tissue/material. Material removed includes Subcutaneous Tissue, Slough, Skin: Dermis, and Skin: Epidermis after achieving pain control using Other (benzocaine 20% spray). No specimens were taken. A time out was conducted at 15:30, prior to the start of the procedure. A Minimum amount of bleeding was controlled with Pressure. The procedure was tolerated well with a pain level of 8 throughout and a pain level of 4 following the procedure. Post Debridement Measurements: 1cm length x 0.9cm width x 0.6cm depth; 0.424cm^3 volume. Character of Wound/Ulcer Post Debridement is improved. Post procedure Diagnosis Wound #6: Same as Pre-Procedure Plan Follow-up  Appointments: Return Appointment in 1 week. Bathing/ Shower/ Hygiene: May shower with protection but do not get wound dressing(s) wet. - may use cast protector to cover wrap to shower Edema Control - Lymphedema / SCD / Other: Elevate legs to the level of the heart or above for 30 minutes daily and/or when sitting, a frequency of: Avoid standing for long periods of time. Exercise regularly The following medication(s) was prescribed: benzocaine topical 20 % aerosol aerosol topical for prior to debridement was prescribed at facility WOUND #6: - Lower Leg Wound Laterality: Left, Posterior Peri-Wound Care: Sween Lotion (Moisturizing lotion) 1 x Per Week/ Discharge Instructions: Apply moisturizing lotion as directed Prim Dressing: IODOFLEX 0.9% Cadexomer Iodine Pad 4x6 cm 1 x Per Week/ ary Discharge Instructions: Apply to wound bed as instructed Secondary Dressing: Woven Gauze Sponge, Non-Sterile 4x4 in 1 x Per Week/ Discharge Instructions: Apply over primary dressing as directed. Com pression Wrap: Kerlix Roll 4.5x3.1 (in/yd) 1 x Per Week/ Discharge Instructions: Apply Kerlix and Coban compression as directed. Com pression Wrap: Coban Self-Adherent Wrap 4x5 (in/yd) 1 x Per Week/ Discharge Instructions: Apply over Kerlix as directed. NOT TOO TIGHT!! 1. I am going to use Iodoflex to try to continue the clean up of the wound surface 2. This was a hematoma by the sound of it she managed to get all of this to close except the small area that was noted. Debrided because of the undermining and no viable surface to dress the wound bed. 3. The patient has significant PAD but no current symptoms of claudication. I am going to limit myself probably to kerlix Coban in terms of compression. I  cannot rule out having to send her back to vascular 4. She also has known deep venous reflux. I am not sure if she was wearing compression stockings I will need to discuss this with her. At the time she was evaluated  in 2018 she had no superficially correctable venous reflux however. 5. If we can get the surface of this cleaned up I think we can probably use endoform under kerlix and Coban. 6. She drove herself in here today she is not going to be a candidate for home health my aim is to keep the kerlix Coban wrap on all week I spent 35 minutes in review of this patient past medical history face-to-face evaluation and preparation of this record Electronic Signature(s) Signed: 03/30/2020 5:12:12 PM By: Linton Ham MD Entered By: Linton Ham on 03/30/2020 16:23:56 -------------------------------------------------------------------------------- HxROS Details Patient Name: Date of Service: Judith Boone. 03/30/2020 2:45 PM Medical Record Number: [id number] Patient Account Number: [id number] Date of Birth/Sex: Treating RN: 1931-10-04 (86 y.o. [name] Primary Care Provider: Velna Hatchet Other Clinician: Referring Provider: Treating Provider/Extender: Wendall Mola in Treatment: 0 Information Obtained From Patient Constitutional Symptoms (General Health) Complaints and Symptoms: Negative for: Fatigue; Fever; Chills; Marked Weight Change Eyes Complaints and Symptoms: Positive for: Glasses / Contacts Negative for: Dry Eyes; Vision Changes Medical History: Positive for: Cataracts Negative for: Glaucoma; Optic Neuritis Ear/Nose/Mouth/Throat Complaints and Symptoms: Negative for: Chronic sinus problems or rhinitis Medical History: Negative for: Chronic sinus problems/congestion; Middle ear problems Past Medical History Notes: Hearing Loss Respiratory Complaints and Symptoms: Negative for: Chronic or frequent coughs; Shortness of Breath Medical History: Negative for: Aspiration; Asthma; Chronic Obstructive Pulmonary Disease (COPD); Pneumothorax; Sleep Apnea; Tuberculosis Cardiovascular Complaints and Symptoms: Negative for: Chest pain Medical  History: Positive for: Hypertension; Peripheral Arterial Disease Negative for: Angina; Arrhythmia; Congestive Heart Failure; Coronary Artery Disease; Deep Vein Thrombosis; Hypotension; Myocardial Infarction; Peripheral Venous Disease; Phlebitis; Vasculitis Gastrointestinal Complaints and Symptoms: Negative for: Frequent diarrhea; Nausea; Vomiting Medical History: Negative for: Cirrhosis ; Colitis; Crohns; Hepatitis A; Hepatitis B; Hepatitis C Past Medical History Notes: GERD Esophageal Web Endocrine Complaints and Symptoms: Negative for: Heat/cold intolerance Medical History: Negative for: Type I Diabetes; Type II Diabetes Genitourinary Complaints and Symptoms: Negative for: Frequent urination Medical History: Negative for: End Stage Renal Disease Past Medical History Notes: Renal Insufficiency CKD Stage 3 Integumentary (Skin) Complaints and Symptoms: Positive for: Wounds Medical History: Negative for: History of Burn Past Medical History Notes: Cellulitis Musculoskeletal Complaints and Symptoms: Negative for: Muscle Pain; Muscle Weakness Medical History: Positive for: Osteoarthritis Negative for: Gout; Rheumatoid Arthritis Past Medical History Notes: Unspecified Arthritis Neurologic Complaints and Symptoms: Negative for: Numbness/parasthesias Medical History: Negative for: Dementia; Neuropathy; Quadriplegia; Paraplegia; Seizure Disorder Psychiatric Complaints and Symptoms: Negative for: Claustrophobia; Suicidal Medical History: Negative for: Anorexia/bulimia; Confinement Anxiety Hematologic/Lymphatic Medical History: Positive for: Lymphedema Negative for: Anemia; Hemophilia; Human Immunodeficiency Virus Past Medical History Notes: Hyperlipidemia sepsis Immunological Medical History: Negative for: Lupus Erythematosus; Raynauds; Scleroderma Oncologic Medical History: Negative for: Received Chemotherapy; Received Radiation HBO Extended History  Items Eyes: Cataracts Immunizations Pneumococcal Vaccine: Received Pneumococcal Vaccination: Yes Implantable Devices None Hospitalization / Surgery History Type of Hospitalization/Surgery Abdominal Hysterectomy L Broken Ankle Repair Cataract Extraction R T Knee Arthroplasty otal Family and Social History Cancer: No; Diabetes: No; Heart Disease: Yes - Father; Hereditary Spherocytosis: No; Hypertension: Yes - Father,Children; Seizures: No; Stroke: No; Thyroid Problems: No; Tuberculosis: No; Former smoker - Quit in 1981; Marital Status - Widowed; Alcohol Use: Rarely -  wine; Drug Use: No History; Caffeine Use: Never; Financial Concerns: No; Food, Clothing or Shelter Needs: No; Support System Lacking: No; Transportation Concerns: No Electronic Signature(s) Signed: 03/30/2020 5:12:12 PM By: Linton Ham MD Signed: 03/30/2020 5:16:12 PM By: Carlene Coria RN Entered By: Carlene Coria on 03/30/2020 14:48:10 -------------------------------------------------------------------------------- SuperBill Details Patient Name: Date of Service: Judith Boone 03/30/2020 Medical Record Number: [id number] Patient Account Number: [id number] Date of Birth/Sex: Treating RN: [birth year] (86 y.o. 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 Facility Procedures CPT4 Code: [id number] Description: 40347 - WOUND CARE VISIT-LEV 3 EST PT Modifier: Quantity: 1 CPT4 Code: [id number] Description: 75643 - DEB SUBQ TISSUE 20 SQ CM/< ICD-10 Diagnosis Description L97.222 Non-pressure chronic ulcer of left calf with fat layer  exposed Modifier: Quantity: 1 Physician Procedures : CPT4 Code Description Modifier [id number] - WC PHYS LEVEL 4 - EST PT 25 ICD-10 Diagnosis Description S80.12XD Contusion of left lower leg, subsequent encounter L97.222 Non-pressure chronic ulcer of left calf with fat layer exposed I70.242  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

## 2020-04-02 NOTE — Progress Notes (Signed)
Judith Boone, Judith Boone ([id number]) Visit Report for 03/30/2020 Allergy List Details Patient Name: Date of Service: [name] 03/30/2020 2:45 PM Medical Record Number: [id number] Patient Account Number: [id number] Date of Birth/Sex: Treating RN: 10-05-1931 (86 Judith.o. Judith Boone Primary Care Judith Boone: Judith Boone Other Clinician: Referring Judith Boone: Treating Judith Boone/Extender: Judith Boone in Treatment: 0 Allergies Active Allergies bacitracin Neosporin (neo-bac-polym) Polysporin Allergy Notes Electronic Signature(s) Signed: 03/30/2020 5:16:12 PM By: Judith Coria RN Entered By: Judith Boone on 03/30/2020 14:45:09 -------------------------------------------------------------------------------- Arrival Information Details Patient Name: Date of Service: Judith Boone. 03/30/2020 2:45 PM Medical Record Number: [id number] Patient Account Number: [id number] Date of Birth/Sex: Treating RN: 07-Feb-1932 (42 Judith.o. Judith Boone Primary Care Lummie Montijo: Judith Boone Other Clinician: Referring Judith Boone: Treating Judith Boone/Extender: Judith Boone in Treatment: 0 Visit Information Patient Arrived: Ambulatory Arrival Time: 14:37 Accompanied By: self Transfer Assistance: None Patient Identification Verified: Yes Secondary Verification Process Completed: Yes History Since Last Visit All ordered tests and consults were completed: No Added or deleted any medications: No Any new allergies or adverse reactions: No Had a fall or experienced change in activities of daily living that may affect risk of falls: No Signs or symptoms of abuse/neglect since last visito No Hospitalized since last visit: No Implantable device outside of the clinic excluding cellular tissue based products placed in the center since last visit: No Pain Present Now: Yes Electronic Signature(s) Signed: 03/30/2020 5:16:12 PM By: Judith Coria RN Entered By: Judith Boone on  03/30/2020 14:44:20 -------------------------------------------------------------------------------- Clinic Level of Care Assessment Details Patient Name: Date of Service: Judith Boone 03/30/2020 2:45 PM Medical Record Number: [id number] Patient Account Number: [id number] Date of Birth/Sex: Treating RN: 1931/09/14 (6 Judith.o. Judith Boone Primary Care Zorana Brockwell: Judith Boone Other Clinician: Referring Judith Boone: Treating Judith Boone/Extender: Judith Boone in Treatment: 0 Clinic Level of Care Assessment Items TOOL 1 Quantity Score []  - 0 Use when EandM and Procedure is performed on INITIAL visit ASSESSMENTS - Nursing Assessment / Reassessment X- 1 20 General Physical Exam (combine w/ comprehensive assessment (listed just below) when performed on new pt. evals) X- 1 25 Comprehensive Assessment (HX, ROS, Risk Assessments, Wounds Hx, etc.) ASSESSMENTS - Wound and Skin Assessment / Reassessment []  - 0 Dermatologic / Skin Assessment (not related to wound area) ASSESSMENTS - Ostomy and/or Continence Assessment and Care []  - 0 Incontinence Assessment and Management []  - 0 Ostomy Care Assessment and Management (repouching, etc.) PROCESS - Coordination of Care X - Simple Patient / Family Education for ongoing care 1 15 []  - 0 Complex (extensive) Patient / Family Education for ongoing care X- 1 10 Staff obtains Programmer, systems, Records, T Results / Process Orders est []  - 0 Staff telephones HHA, Nursing Homes / Clarify orders / etc []  - 0 Routine Transfer to another Facility (non-emergent condition) []  - 0 Routine Hospital Admission (non-emergent condition) X- 1 15 New Admissions / Biomedical engineer / Ordering NPWT Apligraf, etc. , []  - 0 Emergency Hospital Admission (emergent condition) PROCESS - Special Needs []  - 0 Pediatric / Minor Patient Management []  - 0 Isolation Patient Management []  - 0 Hearing / Language / Visual special needs []  -  0 Assessment of Community assistance (transportation, D/C planning, etc.) []  - 0 Additional assistance / Altered mentation []  - 0 Support Surface(s) Assessment (bed, cushion, seat, etc.) INTERVENTIONS - Miscellaneous []  - 0 External ear exam []  - 0 Patient Transfer (multiple staff / Civil Service fast streamer /  Similar devices) []  - 0 Simple Staple / Suture removal (25 or less) []  - 0 Complex Staple / Suture removal (26 or more) []  - 0 Hypo/Hyperglycemic Management (do not check if billed separately) X- 1 15 Ankle / Brachial Index (ABI) - do not check if billed separately Has the patient been seen at the hospital within the last three years: Yes Total Score: 100 Level Of Care: New/Established - Level 3 Electronic Signature(s) Signed: 03/30/2020 5:01:53 PM By: Baruch Gouty RN, BSN Signed: 03/30/2020 5:01:53 PM By: Baruch Gouty RN, BSN Entered By: Baruch Gouty on 03/30/2020 15:32:38 -------------------------------------------------------------------------------- Encounter Discharge Information Details Patient Name: Date of Service: Judith Boone. 03/30/2020 2:45 PM Medical Record Number: [id number] Patient Account Number: [id number] Date of Birth/Sex: Treating RN: 08-06-1931 (59 Judith.o. Judith Boone Primary Care Shavanna Furnari: Judith Boone Other Clinician: Referring Jaja Switalski: Treating Owenn Rothermel/Extender: Judith Boone in Treatment: 0 Encounter Discharge Information Items Post Procedure Vitals Discharge Condition: Stable Temperature (F): 98.2 Ambulatory Status: Ambulatory Pulse (bpm): 102 Discharge Destination: Home Respiratory Rate (breaths/min): 18 Transportation: Private Auto Blood Pressure (mmHg): 135/76 Accompanied By: SELF Schedule Follow-up Appointment: Yes Clinical Summary of Care: Patient Declined Electronic Signature(s) Signed: 04/02/2020 5:19:12 PM By: Judith Hammock RN Entered By: Judith Boone on 03/30/2020  15:46:25 -------------------------------------------------------------------------------- Lower Extremity Assessment Details Patient Name: Date of Service: Judith Boone 03/30/2020 2:45 PM Medical Record Number: [id number] Patient Account Number: [id number] Date of Birth/Sex: Treating RN: 10-03-1931 (87 Judith.o. Judith Boone Primary Care Athenia Rys: Judith Boone Other Clinician: Referring Malanie Koloski: Treating Austin Herd/Extender: Judith Boone in Treatment: 0 Edema Assessment Assessed: [Left: No] [Right: No] E[Left: dema] [Right: :] Calf Left: Right: Point of Measurement: 36 cm From Medial Instep 31 cm Ankle Left: Right: Point of Measurement: 10 cm From Medial Instep 21 cm Vascular Assessment Blood Pressure: Brachial: [Left:135] Ankle: [Left:Dorsalis Pedis: 70 0.52] Electronic Signature(s) Signed: 03/30/2020 5:16:12 PM By: Judith Coria RN Entered By: Judith Boone on 03/30/2020 14:58:35 -------------------------------------------------------------------------------- Multi Wound Chart Details Patient Name: Date of Service: Judith Boone. 03/30/2020 2:45 PM Medical Record Number: [id number] Patient Account Number: [id number] Date of Birth/Sex: Treating RN: 1931-11-16 (53 Judith.o. Judith Boone Primary Care Quetzal Meany: Judith Boone Other Clinician: Referring Montoya Watkin: Treating Anthem Frazer/Extender: Judith Boone in Treatment: 0 Vital Signs Height(in): 60 Pulse(bpm): 102 Weight(lbs): 138 Blood Pressure(mmHg): 135/76 Body Mass Index(BMI): 27 Temperature(F): 98.2 Respiratory Rate(breaths/min): 18 Photos: [6:No Photos Left, Posterior Lower Leg] [N/A:N/A N/A] Wound Location: [6:Trauma] [N/A:N/A] Wounding Event: [6:Inflammatory] [N/A:N/A] Primary Etiology: [6:Cataracts, Lymphedema,] [N/A:N/A] Comorbid History: [6:Hypertension, Peripheral Arterial Disease, Osteoarthritis 12/28/2019] [N/A:N/A] Date Acquired: [6:0]  [N/A:N/A] Weeks of Treatment: [6:Open] [N/A:N/A] Wound Status: [6:1x0.9x0.6] [N/A:N/A] Measurements L x W x D (cm) [6:0.707] [N/A:N/A] A (cm) : rea [6:0.424] [N/A:N/A] Volume (cm) : [6:0.00%] [N/A:N/A] % Reduction in A [6:rea: 0.00%] [N/A:N/A] % Reduction in Volume: [6:Full Thickness Without Exposed] [N/A:N/A] Classification: [6:Support Structures Medium] [N/A:N/A] Exudate A mount: [6:Serosanguineous] [N/A:N/A] Exudate Type: [6:red, brown] [N/A:N/A] Exudate Color: [6:Small (1-33%)] [N/A:N/A] Granulation A mount: [6:Red] [N/A:N/A] Granulation Quality: [6:Large (67-100%)] [N/A:N/A] Necrotic A mount: [6:Fat Layer (Subcutaneous Tissue): Yes N/A] Exposed Structures: [6:Fascia: No Tendon: No Muscle: No Joint: No Bone: No Small (1-33%)] [N/A:N/A] Epithelialization: [6:Debridement - Excisional] [N/A:N/A] Debridement: Pre-procedure Verification/Time Out 15:30 [N/A:N/A] Taken: [6:Other] [N/A:N/A] Pain Control: [6:Subcutaneous, Slough] [N/A:N/A] Tissue Debrided: [6:Skin/Subcutaneous Tissue] [N/A:N/A] Level: [6:0.9] [N/A:N/A] Debridement A (sq cm): [6:rea Forceps, Scissors] [N/A:N/A] Instrument: [6:Minimum] [N/A:N/A] Bleeding: [6:Pressure] [N/A:N/A] Hemostasis A chieved: [6:8] [N/A:N/A] Procedural  Pain: [6:4] [N/A:N/A] Post Procedural Pain: [6:Procedure was tolerated well] [N/A:N/A] Debridement Treatment Response: [6:1x0.9x0.6] [N/A:N/A] Post Debridement Measurements L x W x D (cm) [6:0.424] [N/A:N/A] Post Debridement Volume: (cm) [6:Debridement] [N/A:N/A] Treatment Notes Wound #6 (Lower Leg) Wound Laterality: Left, Posterior Cleanser Peri-Wound Care Sween Lotion (Moisturizing lotion) Discharge Instruction: Apply moisturizing lotion as directed Topical Primary Dressing IODOFLEX 0.9% Cadexomer Iodine Pad 4x6 cm Discharge Instruction: Apply to wound bed as instructed Secondary Dressing Woven Gauze Sponge, Non-Sterile 4x4 in Discharge Instruction: Apply over primary dressing as  directed. Secured With Compression Wrap Kerlix Roll 4.5x3.1 (in/yd) Discharge Instruction: Apply Kerlix and Coban compression as directed. Coban Self-Adherent Wrap 4x5 (in/yd) Discharge Instruction: Apply over Kerlix as directed. NOT TOO TIGHT!! Compression Stockings Add-Ons Electronic Signature(s) Signed: 03/30/2020 5:01:53 PM By: Baruch Gouty RN, BSN Signed: 03/30/2020 5:12:12 PM By: Linton Ham MD Entered By: Linton Ham on 03/30/2020 16:10:18 -------------------------------------------------------------------------------- Multi-Disciplinary Care Plan Details Patient Name: Date of Service: Judith Boone. 03/30/2020 2:45 PM Medical Record Number: [id number] Patient Account Number: [id number] Date of Birth/Sex: Treating RN: June 17, 1931 (68 Judith.o. Judith Boone Primary Care Oliviarose Punch: Judith Boone Other Clinician: Referring Aariana Shankland: Treating Jaree Dwight/Extender: Judith Boone in Treatment: 0 Active Inactive Abuse / Safety / Falls / Self Care Management Nursing Diagnoses: Potential for falls Goals: Patient/caregiver will verbalize/demonstrate measures taken to prevent injury and/or falls Date Initiated: 03/30/2020 Target Resolution Date: 04/27/2020 Goal Status: Active Interventions: Assess fall risk on admission and as needed Notes: Venous Leg Ulcer Nursing Diagnoses: Knowledge deficit related to disease process and management Potential for venous Insuffiency (use before diagnosis confirmed) Goals: Patient will maintain optimal edema control Date Initiated: 03/30/2020 Target Resolution Date: 04/27/2020 Goal Status: Active Interventions: Assess peripheral edema status every visit. Compression as ordered Provide education on venous insufficiency Treatment Activities: Therapeutic compression applied : 03/30/2020 Notes: Wound/Skin Impairment Nursing Diagnoses: Impaired tissue integrity Knowledge deficit related to  ulceration/compromised skin integrity Goals: Patient/caregiver will verbalize understanding of skin care regimen Date Initiated: 03/30/2020 Target Resolution Date: 04/27/2020 Goal Status: Active Ulcer/skin breakdown will have a volume reduction of 30% by week 4 Date Initiated: 03/30/2020 Target Resolution Date: 04/27/2020 Goal Status: Active Interventions: Assess patient/caregiver ability to obtain necessary supplies Assess patient/caregiver ability to perform ulcer/skin care regimen upon admission and as needed Assess ulceration(s) every visit Provide education on ulcer and skin care Treatment Activities: Skin care regimen initiated : 03/30/2020 Topical wound management initiated : 03/30/2020 Notes: Electronic Signature(s) Signed: 03/30/2020 5:01:53 PM By: Baruch Gouty RN, BSN Entered By: Baruch Gouty on 03/30/2020 15:28:54 -------------------------------------------------------------------------------- Pain Assessment Details Patient Name: Date of Service: Judith Boone 03/30/2020 2:45 PM Medical Record Number: [id number] Patient Account Number: [id number] Date of Birth/Sex: Treating RN: 05-12-1931 (47 Judith.o. Judith Boone Primary Care Eisa Conaway: Judith Boone Other Clinician: Referring Jacqualynn Parco: Treating Jack Mineau/Extender: Judith Boone in Treatment: 0 Active Problems Location of Pain Severity and Description of Pain Patient Has Paino Yes Site Locations With Dressing Change: Yes Duration of the Pain. Constant / Intermittento Intermittent How Long Does it Lasto Hours: Minutes: 15 Rate the pain. Current Pain Level: 2 Worst Pain Level: 5 Least Pain Level: 0 Tolerable Pain Level: 5 Character of Pain Describe the Pain: Tender Pain Management and Medication Current Pain Management: Medication: Yes Cold Application: No Rest: Yes Massage: No Activity: No T.E.N.S.: No Heat Application: No Leg drop or elevation: No Is the Current Pain  Management Adequate: Inadequate How does your wound impact your activities of daily  livingo Sleep: No Bathing: No Appetite: No Relationship With Others: No Bladder Continence: No Emotions: No Bowel Continence: No Work: No Toileting: No Drive: No Dressing: No Hobbies: No Electronic Signature(s) Signed: 03/30/2020 5:16:12 PM By: Judith Coria RN Entered By: Judith Boone on 03/30/2020 15:05:09 -------------------------------------------------------------------------------- Patient/Caregiver Education Details Patient Name: Date of Service: Judith Boone 12/3/2021andnbsp2:45 PM Medical Record Number: [id number] Patient Account Number: [id number] Date of Birth/Gender: Treating RN: 05-08-1931 (37 Judith.o. Judith Boone Primary Care Physician: Judith Boone Other Clinician: Referring Physician: Treating Physician/Extender: Judith Boone in Treatment: 0 Education Assessment Education Provided To: Patient Education Topics Provided Wound/Skin Impairment: Methods: Explain/Verbal Responses: Reinforcements needed, State content correctly Electronic Signature(s) Signed: 03/30/2020 5:01:53 PM By: Baruch Gouty RN, BSN Entered By: Baruch Gouty on 03/30/2020 15:29:10 -------------------------------------------------------------------------------- Wound Assessment Details Patient Name: Date of Service: Judith Boone. 03/30/2020 2:45 PM Medical Record Number: [id number] Patient Account Number: [id number] Date of Birth/Sex: Treating RN: July 31, 1931 (66 Judith.o. Judith Boone Primary Care Kino Dunsworth: Judith Boone Other Clinician: Referring Tyanna Hach: Treating Catrice Zuleta/Extender: Judith Boone in Treatment: 0 Wound Status Wound Number: 6 Primary Inflammatory Etiology: Wound Location: Left, Posterior Lower Leg Wound Open Wounding Event: Trauma Status: Date Acquired: 12/28/2019 Comorbid Cataracts, Lymphedema, Hypertension,  Peripheral Arterial Weeks Of Treatment: 0 History: Disease, Osteoarthritis Clustered Wound: No Wound Measurements Length: (cm) 1 Width: (cm) 0.9 Depth: (cm) 0.6 Area: (cm) 0.707 Volume: (cm) 0.424 % Reduction in Area: 0% % Reduction in Volume: 0% Epithelialization: Small (1-33%) Tunneling: No Undermining: No Wound Description Classification: Full Thickness Without Exposed Support Structures Exudate Amount: Medium Exudate Type: Serosanguineous Exudate Color: red, brown Foul Odor After Cleansing: No Slough/Fibrino Yes Wound Bed Granulation Amount: Small (1-33%) Exposed Structure Granulation Quality: Red Fascia Exposed: No Necrotic Amount: Large (67-100%) Fat Layer (Subcutaneous Tissue) Exposed: Yes Necrotic Quality: Adherent Slough Tendon Exposed: No Muscle Exposed: No Joint Exposed: No Bone Exposed: No Treatment Notes Wound #6 (Lower Leg) Wound Laterality: Left, Posterior Cleanser Peri-Wound Care Sween Lotion (Moisturizing lotion) Discharge Instruction: Apply moisturizing lotion as directed Topical Primary Dressing IODOFLEX 0.9% Cadexomer Iodine Pad 4x6 cm Discharge Instruction: Apply to wound bed as instructed Secondary Dressing Woven Gauze Sponge, Non-Sterile 4x4 in Discharge Instruction: Apply over primary dressing as directed. Secured With Compression Wrap Kerlix Roll 4.5x3.1 (in/yd) Discharge Instruction: Apply Kerlix and Coban compression as directed. Coban Self-Adherent Wrap 4x5 (in/yd) Discharge Instruction: Apply over Kerlix as directed. NOT TOO TIGHT!! Compression Stockings Add-Ons Electronic Signature(s) Signed: 03/30/2020 5:16:12 PM By: Judith Coria RN Entered By: Judith Boone on 03/30/2020 15:04:03 -------------------------------------------------------------------------------- Vitals Details Patient Name: Date of Service: Judith Boone. 03/30/2020 2:45 PM Medical Record Number: [id number] Patient Account Number: [id number] Date of  Birth/Sex: Treating RN: 1931/06/15 (71 Judith.o. Judith Boone Primary Care Haruo Stepanek: Judith Boone Other Clinician: Referring Annamaria Salah: Treating Joshau Code/Extender: Judith Boone in Treatment: 0 Vital Signs Time Taken: 14:44 Temperature (F): 98.2 Height (in): 60 Pulse (bpm): 102 Source: Stated Respiratory Rate (breaths/min): 18 Weight (lbs): 138 Blood Pressure (mmHg): 135/76 Body Mass Index (BMI): 26.9 Reference Range: 80 - 120 mg / dl Electronic Signature(s) Signed: 03/30/2020 5:16:12 PM By: Judith Coria RN Entered By: Judith Boone on 03/30/2020 14:44:53

## 2020-04-06 ENCOUNTER — Other Ambulatory Visit: Payer: Self-pay

## 2020-04-06 ENCOUNTER — Encounter (HOSPITAL_BASED_OUTPATIENT_CLINIC_OR_DEPARTMENT_OTHER): Payer: Medicare Other | Admitting: Internal Medicine

## 2020-04-06 DIAGNOSIS — I872 Venous insufficiency (chronic) (peripheral): Secondary | ICD-10-CM | POA: Diagnosis not present

## 2020-04-06 DIAGNOSIS — I70242 Atherosclerosis of native arteries of left leg with ulceration of calf: Secondary | ICD-10-CM | POA: Diagnosis not present

## 2020-04-06 DIAGNOSIS — L97222 Non-pressure chronic ulcer of left calf with fat layer exposed: Secondary | ICD-10-CM | POA: Diagnosis not present

## 2020-04-06 DIAGNOSIS — S81812A Laceration without foreign body, left lower leg, initial encounter: Secondary | ICD-10-CM | POA: Diagnosis not present

## 2020-04-06 DIAGNOSIS — L97822 Non-pressure chronic ulcer of other part of left lower leg with fat layer exposed: Secondary | ICD-10-CM | POA: Diagnosis not present

## 2020-04-06 DIAGNOSIS — S8012XA Contusion of left lower leg, initial encounter: Secondary | ICD-10-CM | POA: Diagnosis not present

## 2020-04-06 DIAGNOSIS — L988 Other specified disorders of the skin and subcutaneous tissue: Secondary | ICD-10-CM | POA: Diagnosis not present

## 2020-04-11 ENCOUNTER — Ambulatory Visit (HOSPITAL_COMMUNITY)
Admission: RE | Admit: 2020-04-11 | Discharge: 2020-04-11 | Disposition: A | Discharge status: Home / Self Care | Payer: Medicare Other | Source: Ambulatory Visit | Attending: Physician Assistant | Admitting: Physician Assistant

## 2020-04-11 ENCOUNTER — Encounter (HOSPITAL_COMMUNITY): Payer: Self-pay | Admitting: Physician Assistant

## 2020-04-11 ENCOUNTER — Other Ambulatory Visit: Payer: Self-pay

## 2020-04-11 VITALS — BP 122/58 | HR 85 | Ht 60.0 in | Wt 136.6 lb

## 2020-04-11 DIAGNOSIS — D485 Neoplasm of uncertain behavior of skin: Secondary | ICD-10-CM | POA: Diagnosis not present

## 2020-04-11 DIAGNOSIS — D6869 Other thrombophilia: Secondary | ICD-10-CM | POA: Diagnosis not present

## 2020-04-11 DIAGNOSIS — C44319 Basal cell carcinoma of skin of other parts of face: Secondary | ICD-10-CM | POA: Diagnosis not present

## 2020-04-11 DIAGNOSIS — I471 Supraventricular tachycardia: Secondary | ICD-10-CM | POA: Insufficient documentation

## 2020-04-11 DIAGNOSIS — I10 Essential (primary) hypertension: Secondary | ICD-10-CM | POA: Insufficient documentation

## 2020-04-11 DIAGNOSIS — Z87891 Personal history of nicotine dependence: Secondary | ICD-10-CM | POA: Insufficient documentation

## 2020-04-11 DIAGNOSIS — L812 Freckles: Secondary | ICD-10-CM | POA: Diagnosis not present

## 2020-04-11 DIAGNOSIS — Z79899 Other long term (current) drug therapy: Secondary | ICD-10-CM | POA: Diagnosis not present

## 2020-04-11 DIAGNOSIS — Z7901 Long term (current) use of anticoagulants: Secondary | ICD-10-CM | POA: Insufficient documentation

## 2020-04-11 DIAGNOSIS — L570 Actinic keratosis: Secondary | ICD-10-CM | POA: Diagnosis not present

## 2020-04-11 DIAGNOSIS — I480 Paroxysmal atrial fibrillation: Secondary | ICD-10-CM | POA: Diagnosis not present

## 2020-04-11 DIAGNOSIS — L821 Other seborrheic keratosis: Secondary | ICD-10-CM | POA: Diagnosis not present

## 2020-04-11 DIAGNOSIS — Z85828 Personal history of other malignant neoplasm of skin: Secondary | ICD-10-CM | POA: Diagnosis not present

## 2020-04-11 DIAGNOSIS — I440 Atrioventricular block, first degree: Secondary | ICD-10-CM | POA: Insufficient documentation

## 2020-04-11 DIAGNOSIS — L239 Allergic contact dermatitis, unspecified cause: Secondary | ICD-10-CM | POA: Diagnosis not present

## 2020-04-11 NOTE — Progress Notes (Addendum)
Primary Care Physician: Velna Hatchet, MD Primary Cardiologist: none Primary Electrophysiologist: none Referring Physician: [facility]   Judith Boone is a 86 y.o. female with a history of COPD, HTN, HLD, PVD and paroxysmal atrial fibrillation who presents for follow up in the [facility]. The patient was initially diagnosed with atrial fibrillation 07/17/18 after presenting to the ER with elevated heart rates on her Apple Watch. She was asymptomatic during the event. She was started on Eliquis for a CHADS2VASC score 5 and metoprolol.  Zio patch 03/2019 showed no afib with brief epidodes of atrial tach and occasional PACs and PVCs.   On follow up today, patient reports she has done well since her last visit with no heart racing or palpitations. No episodes noted on her Apple Watch. She denies any bleeding issues on anticoagulation.   Today, she denies symptoms of palpitations, chest pain, shortness of breath, orthopnea, PND, dizziness, presyncope, syncope, snoring, daytime somnolence, bleeding, or neurologic sequela. The patient is tolerating medications without difficulties and is otherwise without complaint today.    Atrial Fibrillation Risk Factors:  she does not have symptoms or diagnosis of sleep apnea. she does not have a history of rheumatic fever. she does not have a history of alcohol use. The patient does not have a history of early familial atrial fibrillation or other arrhythmias.  she has a BMI of Body mass index is 26.68 kg/m.Marland Kitchen Filed Weights   04/11/20 1339  Weight: 62 kg    Family History  Problem Relation Age of Onset  . Alcohol abuse Father   . Heart disease Father   . Hypertension Father   . Hypertension Daughter   . Hypertension Son   . Asthma Son      Atrial Fibrillation Management history:  Previous antiarrhythmic drugs: none Previous cardioversions: none Previous ablations: none CHADS2VASC score: 5 Anticoagulation  history: Eliquis   Past Medical History:  Diagnosis Date  . Allergy    allergic rhinitis  . Arthritis   . Cellulitis of left leg 07-2014   hospitalized for 4 days  . COPD (chronic obstructive pulmonary disease) ([place])   . GERD (gastroesophageal reflux disease)   . History of granulomatous disease   . Hyperlipidemia   . Hypertension   . Left leg swelling   . Right-sided low back pain with sciatica    Past Surgical History:  Procedure Laterality Date  . ABDOMINAL HYSTERECTOMY    . broken ankle repair     left  . CATARACT EXTRACTION    . TOTAL KNEE ARTHROPLASTY     right    Current Outpatient Medications  Medication Sig Dispense Refill  . amLODipine (NORVASC) 5 MG tablet Take 5 mg by mouth at bedtime.   5  . ANORO ELLIPTA 62.5-25 MCG/INH AEPB Inhale 1 puff into the lungs daily.    Marland Kitchen ascorbic acid (VITAMIN C) 500 MG tablet Take by mouth.    Marland Kitchen atorvastatin (LIPITOR) 20 MG tablet Take 20 mg by mouth at bedtime.     . calcium carbonate (OSCAL) 1500 (600 Ca) MG TABS tablet Take by mouth.    . cetirizine (ZYRTEC) 10 MG tablet Take 10 mg by mouth daily.    Marland Kitchen ELIQUIS 2.5 MG TABS tablet TAKE 1 TABLET (2.5 MG TOTAL) BY MOUTH 2 (TWO) TIMES DAILY 180 tablet 2  . famotidine (PEPCID) 20 MG tablet Take 20 mg by mouth at bedtime.     . fluticasone (FLONASE) 50 MCG/ACT nasal spray  Place 2 sprays into both nostrils daily.    . hydrALAZINE (APRESOLINE) 25 MG tablet Take 25 mg by mouth 2 (two) times daily.     . metoprolol succinate (TOPROL-XL) 25 MG 24 hr tablet Take 1 tablet (25 mg total) by mouth daily. 90 tablet 2  . montelukast (SINGULAIR) 10 MG tablet Take 10 mg by mouth daily.    Marland Kitchen omeprazole (PRILOSEC) 40 MG capsule Take 40 mg by mouth daily.     . pramipexole (MIRAPEX) 0.125 MG tablet Take 0.125 mg by mouth at bedtime.  3  . quinapril (ACCUPRIL) 40 MG tablet Take 40 mg by mouth daily.     . SODIUM FLUORIDE 5000 PPM 1.1 % GEL dental gel     . Spacer/Aero-Holding Chambers (AEROCHAMBER  MV) inhaler Use as instructed with Albuterol HFA 1 each 0  . VENTOLIN HFA 108 (90 BASE) MCG/ACT inhaler Inhale 1-2 puffs into the lungs every 6 (six) hours as needed for wheezing.   3   No current facility-administered medications for this encounter.    Allergies  Allergen Reactions  . Bacitracin     unknown  . Neosporin [Neomycin-Bacitracin Zn-Polymyx]     unknown  . Polysporin [Bacitracin-Polymyxin B]     unknown    Social History   Socioeconomic History  . Marital status: Widowed    Spouse name: Not on file  . Number of children: Not on file  . Years of education: Not on file  . Highest education level: Not on file  Occupational History  . Not on file  Tobacco Use  . Smoking status: Former Smoker    Packs/day: 2.00    Years: 30.00    Pack years: 60.00    Types: Cigarettes    Quit date: 04/29/1979    Years since quitting: 40.9  . Smokeless tobacco: Never Used  Vaping Use  . Vaping Use: Not on file  Substance and Sexual Activity  . Alcohol use: Yes    Alcohol/week: 2.0 - 3.0 standard drinks    Types: 2 - 3 Glasses of wine per week    Comment: weekly  . Drug use: No  . Sexual activity: Not on file  Other Topics Concern  . Not on file  Social History Narrative  . Not on file   Social Determinants of Health   Financial Resource Strain: Not on file  Food Insecurity: Not on file  Transportation Needs: Not on file  Physical Activity: Not on file  Stress: Not on file  Social Connections: Not on file  Intimate Partner Violence: Not on file     ROS- All systems are reviewed and negative except as per the HPI above.  Physical Exam: Vitals:   04/11/20 1339  BP: (!) 122/58  Pulse: 85  Weight: 62 kg  Height: 5' (1.524 m)    GEN- The patient is well appearing elderly female, alert and oriented x 3 today.   HEENT-head normocephalic, atraumatic, sclera clear, conjunctiva pink, hearing intact, trachea midline. Lungs- Clear to ausculation bilaterally, normal  work of breathing Heart- Regular rate and rhythm, no murmurs, rubs or gallops  GI- soft, NT, ND, + BS Extremities- no clubbing, cyanosis, or edema MS- no significant deformity or atrophy Skin- no rash or lesion Psych- euthymic mood, full affect Neuro- strength and sensation are intact   Wt Readings from Last 3 Encounters:  04/11/20 62 kg  01/29/20 62.6 kg  12/07/19 66 kg    EKG today demonstrates SR HR 85, 1st degree  AV block, PR 216, QRS 66, QTc 418  Echo 10/12/18 demonstrated   1. The left ventricle has normal systolic function with an ejection fraction of 60-65%. The cavity size was normal. Left ventricular diastolic Doppler parameters are consistent with impaired relaxation.  2. The mitral valve is grossly normal.  3. The tricuspid valve is grossly normal.  4. The aortic valve was not well visualized. Aortic valve regurgitation is trivial by color flow Doppler. No stenosis of the aortic valve.  5. Normal LV function; mild diastolic dysfunction; trace AI; mild TR; mildly elevated pulmonary pressure.  Epic records are reviewed at length today  Assessment and Plan:  1. Paroxysmal atrial fibrillation/atrial tachycardia  Patient appears to be maintaining SR. Continue Eliquis 2.5 mg BID. (age, Cr) Patient had recent labs with PCP, will request these results.  Continue metoprolol 25 mg daily Apple Watch for home monitoring.   This patients CHA2DS2-VASc Score and unadjusted Ischemic Stroke Rate (% per year) is equal to 7.2 % stroke rate/year from a score of 5  Above score calculated as 1 point each if present [CHF, HTN, DM, Vascular=MI/PAD/Aortic Plaque, Age if 65-74, or Female] Above score calculated as 2 points each if present [Age > 75, or Stroke/TIA/TE]  2. HTN Stable, no changes today.   Follow up in the AF clinic in 6 months.    Addendum: Labs from PCP 02/07/20 Cr 1.7, K+ 4.9, AST 16, ALT 14 WBC 8.27, Hgb 10.5, Hct 31.9 TSH 1.19   Adline Peals PA-C Afib  [facility] [address] [place], [place] [address] [phone] 04/11/2020 1:57 PM

## 2020-04-11 NOTE — Addendum Note (Signed)
Encounter addended by: Oliver Barre, PA on: 04/11/2020 2:59 PM  Actions taken: Clinical Note Signed

## 2020-04-12 NOTE — Progress Notes (Signed)
MAKEILA, YAMAGUCHI ([id number]) Visit Report for 04/06/2020 Arrival Information Details Patient Name: Date of Service: [name] 04/06/2020 1:00 PM Medical Record Number: [id number] Patient Account Number: [id number] Date of Birth/Sex: Treating RN: [birth year] (86 y.o. [name] Primary Care Cecil Vandyke: Velna Hatchet Other Clinician: Referring Bobbiejo Ishikawa: Treating Aviana Shevlin/Extender: Wendall Mola in Treatment: 1 Visit Information History Since Last Visit All ordered tests and consults were completed: No Patient Arrived: Ambulatory Added or deleted any medications: No Arrival Time: 13:16 Any new allergies or adverse reactions: No Accompanied By: self Had a fall or experienced change in No Transfer Assistance: None activities of daily living that may affect Patient Identification Verified: Yes risk of falls: Secondary Verification Process Completed: Yes Signs or symptoms of abuse/neglect since last visito No Patient Requires Transmission-Based Precautions: No Hospitalized since last visit: No Patient Has Alerts: No Implantable device outside of the clinic excluding No cellular tissue based products placed in the center since last visit: Has Dressing in Place as Prescribed: Yes Has Compression in Place as Prescribed: Yes Pain Present Now: No Electronic Signature(s) Signed: 04/06/2020 2:11:24 PM By: Carlene Coria RN Entered By: Carlene Coria on 04/06/2020 13:26:15 -------------------------------------------------------------------------------- Encounter Discharge Information Details Patient Name: Date of Service: [name]. 04/06/2020 1:00 PM Medical Record Number: [id number] Patient Account Number: [id number] Date of Birth/Sex: Treating RN: [birth year] (86 y.o. [name] Primary Care Armonte Tortorella: Velna Hatchet Other Clinician: Referring Sayra Frisby: Treating Dezire Turk/Extender: Wendall Mola in Treatment: 1 Encounter  Discharge Information Items Post Procedure Vitals Discharge Condition: Stable Temperature (F): 98.2 Ambulatory Status: Ambulatory Pulse (bpm): 87 Discharge Destination: Home Respiratory Rate (breaths/min): 18 Transportation: Private Auto Blood Pressure (mmHg): 135/72 Accompanied By: alone Schedule Follow-up Appointment: Yes Clinical Summary of Care: Patient Declined Electronic Signature(s) Signed: 04/10/2020 5:24:10 PM By: Levan Hurst RN, BSN Entered By: Levan Hurst on 04/06/2020 14:38:31 -------------------------------------------------------------------------------- Lower Extremity Assessment Details Patient Name: Date of Service: [name]. 04/06/2020 1:00 PM Medical Record Number: [id number] Patient Account Number: [id number] Date of Birth/Sex: Treating RN: 03-12-1932 (86 y.o. [name] Primary Care Lundynn Cohoon: Velna Hatchet Other Clinician: Referring Eulamae Greenstein: Treating Tyrell Brereton/Extender: Wendall Mola in Treatment: 1 Edema Assessment Assessed: [Left: No] [Right: No] E[Left: dema] [Right: :] Calf Left: Right: Point of Measurement: 36 cm From Medial Instep 31 cm Ankle Left: Right: Point of Measurement: 10 cm From Medial Instep 21 cm Electronic Signature(s) Signed: 04/06/2020 2:11:24 PM By: Carlene Coria RN Entered By: Carlene Coria on 04/06/2020 13:27:25 -------------------------------------------------------------------------------- Multi Wound Chart Details Patient Name: Date of Service: [name]. 04/06/2020 1:00 PM Medical Record Number: [id number] Patient Account Number: [id number] Date of Birth/Sex: Treating RN: Nov 26, 1931 (86 y.o. [name] Primary Care Ted Leonhart: Velna Hatchet Other Clinician: Referring Bebe Moncure: Treating Hosey Burmester/Extender: Wendall Mola in Treatment: 1 Vital Signs Height(in): 60 Pulse(bpm): 87 Weight(lbs): 138 Blood Pressure(mmHg): 135/72 Body Mass  Index(BMI): 27 Temperature(F): 98.2 Respiratory Rate(breaths/min): 18 Photos: [6:No Photos Left, Posterior Lower Leg] [N/A:N/A N/A] Wound Location: [6:Trauma] [N/A:N/A] Wounding Event: [6:Inflammatory] [N/A:N/A] Primary Etiology: [6:Cataracts, Lymphedema,] [N/A:N/A] Comorbid History: [6:Hypertension, Peripheral Arterial Disease, Osteoarthritis 12/28/2019] [N/A:N/A] Date Acquired: [6:1] [N/A:N/A] Weeks of Treatment: [6:Open] [N/A:N/A] Wound Status: [6:1.1x1.4x0.3] [N/A:N/A] Measurements L x W x D (cm) [6:1.21] [N/A:N/A] A (cm) : rea [6:0.363] [N/A:N/A] Volume (cm) : [6:-71.10%] [N/A:N/A] % Reduction in Area: [6:14.40%] [N/A:N/A] % Reduction in Volume: [6:Full Thickness Without Exposed] [N/A:N/A] Classification: [6:Support Structures Medium] [N/A:N/A] Exudate Amount: [6:Serosanguineous] [  N/A:N/A] Exudate Type: [6:red, brown] [N/A:N/A] Exudate Color: [6:Small (1-33%)] [N/A:N/A] Granulation Amount: [6:Red] [N/A:N/A] Granulation Quality: [6:Large (67-100%)] [N/A:N/A] Necrotic Amount: [6:Fat Layer (Subcutaneous Tissue): Yes N/A] Exposed Structures: [6:Fascia: No Tendon: No Muscle: No Joint: No Bone: No Small (1-33%)] [N/A:N/A] Epithelialization: [6:Debridement - Excisional] [N/A:N/A] Debridement: Pre-procedure Verification/Time Out 13:50 [N/A:N/A] Taken: [6:Other] [N/A:N/A] Pain Control: [6:Subcutaneous, Slough] [N/A:N/A] Tissue Debrided: [6:Skin/Subcutaneous Tissue] [N/A:N/A] Level: [6:1.54] [N/A:N/A] Debridement A (sq cm): [6:rea Curette] [N/A:N/A] Instrument: [6:Minimum] [N/A:N/A] Bleeding: [6:Pressure] [N/A:N/A] Hemostasis A chieved: [6:7] [N/A:N/A] Procedural Pain: [6:4] [N/A:N/A] Post Procedural Pain: [6:Procedure was tolerated well] [N/A:N/A] Debridement Treatment Response: [6:1.1x1.4x0.3] [N/A:N/A] Post Debridement Measurements L x W x D (cm) [6:0.363] [N/A:N/A] Post Debridement Volume: (cm) [6:Debridement] [N/A:N/A] Treatment Notes Electronic Signature(s) Signed:  04/06/2020 5:15:15 PM By: Baruch Gouty RN, BSN Signed: 04/12/2020 8:09:40 AM By: Linton Ham MD Entered By: Linton Ham on 04/06/2020 13:53:42 -------------------------------------------------------------------------------- Multi-Disciplinary Care Plan Details Patient Name: Date of Service: [name], [name]. 04/06/2020 1:00 PM Medical Record Number: [id number] Patient Account Number: [id number] Date of Birth/Sex: Treating RN: [birth year] (86 y.o. [name] Primary Care Delonte Musich: Velna Hatchet Other Clinician: Referring Tejas Seawood: Treating Charleston Vierling/Extender: Wendall Mola in Treatment: 1 Active Inactive Abuse / Safety / Falls / Self Care Management Nursing Diagnoses: Potential for falls Goals: Patient/caregiver will verbalize/demonstrate measures taken to prevent injury and/or falls Date Initiated: 03/30/2020 Target Resolution Date: 04/27/2020 Goal Status: Active Interventions: Assess fall risk on admission and as needed Notes: Venous Leg Ulcer Nursing Diagnoses: Knowledge deficit related to disease process and management Potential for venous Insuffiency (use before diagnosis confirmed) Goals: Patient will maintain optimal edema control Date Initiated: 03/30/2020 Target Resolution Date: 04/27/2020 Goal Status: Active Interventions: Assess peripheral edema status every visit. Compression as ordered Provide education on venous insufficiency Treatment Activities: Therapeutic compression applied : 03/30/2020 Notes: Wound/Skin Impairment Nursing Diagnoses: Impaired tissue integrity Knowledge deficit related to ulceration/compromised skin integrity Goals: Patient/caregiver will verbalize understanding of skin care regimen Date Initiated: 03/30/2020 Target Resolution Date: 04/27/2020 Goal Status: Active Ulcer/skin breakdown will have a volume reduction of 30% by week 4 Date Initiated: 03/30/2020 Target Resolution Date:  04/27/2020 Goal Status: Active Interventions: Assess patient/caregiver ability to obtain necessary supplies Assess patient/caregiver ability to perform ulcer/skin care regimen upon admission and as needed Assess ulceration(s) every visit Provide education on ulcer and skin care Treatment Activities: Skin care regimen initiated : 03/30/2020 Topical wound management initiated : 03/30/2020 Notes: Electronic Signature(s) Signed: 04/06/2020 5:15:15 PM By: Baruch Gouty RN, BSN Entered By: Baruch Gouty on 04/06/2020 13:43:59 -------------------------------------------------------------------------------- Pain Assessment Details Patient Name: Date of Service: [name]. 04/06/2020 1:00 PM Medical Record Number: [id number] Patient Account Number: [id number] Date of Birth/Sex: Treating RN: [birth year] (86 y.o. [name] Primary Care Yareli Carthen: Velna Hatchet Other Clinician: Referring Waverly Chavarria: Treating Aleyssa Pike/Extender: Wendall Mola in Treatment: 1 Active Problems Location of Pain Severity and Description of Pain Patient Has Paino Yes Site Locations With Dressing Change: Yes Duration of the Pain. Constant / Intermittento Intermittent Rate the pain. Current Pain Level: 4 Worst Pain Level: 5 Least Pain Level: 0 Tolerable Pain Level: 0 Character of Pain Describe the Pain: Tender, Throbbing Pain Management and Medication Current Pain Management: Medication: Yes Cold Application: No Rest: Yes Massage: No Activity: No T.E.N.S.: No Heat Application: No Leg drop or elevation: No Is the Current Pain Management Adequate: Inadequate How does your wound impact your activities of daily livingo Sleep: No Bathing: No Appetite: No Relationship With Others:  No Bladder Continence: No Emotions: No Bowel Continence: No Work: No Toileting: No Drive: No Dressing: No Hobbies: No Electronic Signature(s) Signed: 04/06/2020 2:11:24 PM By: Carlene Coria RN Entered By: Carlene Coria on 04/06/2020 13:27:21 -------------------------------------------------------------------------------- Patient/Caregiver Education Details Patient Name: Date of Service: [name] 12/10/2021andnbsp1:00 PM Medical Record Number: [id number] Patient Account Number: [id number] Date of Birth/Gender: Treating RN: 1931/11/15 (86 y.o. [name] Primary Care Physician: Velna Hatchet Other Clinician: Referring Physician: Treating Physician/Extender: Wendall Mola in Treatment: 1 Education Assessment Education Provided To: Patient Education Topics Provided Venous: Methods: Explain/Verbal Responses: Reinforcements needed, State content correctly Wound/Skin Impairment: Methods: Explain/Verbal Responses: Reinforcements needed, State content correctly Electronic Signature(s) Signed: 04/06/2020 5:15:15 PM By: Baruch Gouty RN, BSN Entered By: Baruch Gouty on 04/06/2020 13:44:19 -------------------------------------------------------------------------------- Wound Assessment Details Patient Name: Date of Service: [name]. 04/06/2020 1:00 PM Medical Record Number: [id number] Patient Account Number: [id number] Date of Birth/Sex: Treating RN: 1932/02/21 (86 y.o. [name] Primary Care Dontez Hauss: Velna Hatchet Other Clinician: Referring Adella Manolis: Treating Earnestene Angello/Extender: Wendall Mola in Treatment: 1 Wound Status Wound Number: 6 Primary Inflammatory Etiology: Wound Location: Left, Posterior Lower Leg Wound Open Wounding Event: Trauma Status: Date Acquired: 12/28/2019 Comorbid Cataracts, Lymphedema, Hypertension, Peripheral Arterial Weeks Of Treatment: 1 History: Disease, Osteoarthritis Clustered Wound: No Wound Measurements Length: (cm) 1.1 Width: (cm) 1.4 Depth: (cm) 0.3 Area: (cm) 1.21 Volume: (cm) 0.363 % Reduction in Area: -71.1% % Reduction in Volume:  14.4% Epithelialization: Small (1-33%) Tunneling: No Undermining: No Wound Description Classification: Full Thickness Without Exposed Support Structures Exudate Amount: Medium Exudate Type: Serosanguineous Exudate Color: red, brown Foul Odor After Cleansing: No Slough/Fibrino Yes Wound Bed Granulation Amount: Small (1-33%) Exposed Structure Granulation Quality: Red Fascia Exposed: No Necrotic Amount: Large (67-100%) Fat Layer (Subcutaneous Tissue) Exposed: Yes Necrotic Quality: Adherent Slough Tendon Exposed: No Muscle Exposed: No Joint Exposed: No Bone Exposed: No Treatment Notes Wound #6 (Lower Leg) Wound Laterality: Left, Posterior Cleanser Peri-Wound Care Sween Lotion (Moisturizing lotion) Discharge Instruction: Apply moisturizing lotion as directed Topical Primary Dressing IODOFLEX 0.9% Cadexomer Iodine Pad 4x6 cm Discharge Instruction: Apply to wound bed as instructed Secondary Dressing Woven Gauze Sponge, Non-Sterile 4x4 in Discharge Instruction: Apply over primary dressing as directed. Secured With Compression Wrap Kerlix Roll 4.5x3.1 (in/yd) Discharge Instruction: Apply Kerlix and Coban compression as directed. Coban Self-Adherent Wrap 4x5 (in/yd) Discharge Instruction: Apply over Kerlix as directed. NOT TOO TIGHT!! Compression Stockings Add-Ons Electronic Signature(s) Signed: 04/06/2020 2:11:24 PM By: Carlene Coria RN Entered By: Carlene Coria on 04/06/2020 13:27:47 -------------------------------------------------------------------------------- Vitals Details Patient Name: Date of Service: [name], [name]. 04/06/2020 1:00 PM Medical Record Number: [id number] Patient Account Number: [id number] Date of Birth/Sex: Treating RN: 09/29/1931 (86 y.o. [name] Primary Care Christle Nolting: Velna Hatchet Other Clinician: Referring Henrietta Cieslewicz: Treating Jesson Foskey/Extender: Wendall Mola in Treatment: 1 Vital Signs Time Taken:  13:26 Temperature (F): 98.2 Height (in): 60 Pulse (bpm): 87 Weight (lbs): 138 Respiratory Rate (breaths/min): 18 Body Mass Index (BMI): 26.9 Blood Pressure (mmHg): 135/72 Reference Range: 80 - 120 mg / dl Electronic Signature(s) Signed: 04/06/2020 2:11:24 PM By: Carlene Coria RN Entered By: Carlene Coria on 04/06/2020 13:26:43

## 2020-04-12 NOTE — Progress Notes (Signed)
Judith Boone, Judith Boone ([id number]) Visit Report for 04/06/2020 Debridement Details Patient Name: Date of Service: Judith Boone 04/06/2020 1:00 PM Medical Record Number: [id number] Patient Account Number: [id number] Date of Birth/Sex: Treating RN: 08-Dec-1931 (86 y.o. [name] Primary Care Provider: Velna Hatchet Other Clinician: Referring Provider: Treating Provider/Extender: Wendall Mola in Treatment: 1 Debridement Performed for Assessment: Wound #6 Left,Posterior Lower Leg Performed By: Physician Ricard Dillon., MD Debridement Type: Debridement Level of Consciousness (Pre-procedure): Awake and Alert Pre-procedure Verification/Time Out Yes - 13:50 Taken: Start Time: 13:50 Pain Control: Other : Benzocaine 20% T Area Debrided (L x W): otal 1.1 (cm) x 1.4 (cm) = 1.54 (cm) Tissue and other material debrided: Viable, Non-Viable, Slough, Subcutaneous, Slough Level: Skin/Subcutaneous Tissue Debridement Description: Excisional Instrument: Curette Bleeding: Minimum Hemostasis Achieved: Pressure End Time: 13:52 Procedural Pain: 7 Post Procedural Pain: 4 Response to Treatment: Procedure was tolerated well Level of Consciousness (Post- Awake and Alert procedure): Post Debridement Measurements of Total Wound Length: (cm) 1.1 Width: (cm) 1.4 Depth: (cm) 0.3 Volume: (cm) 0.363 Character of Wound/Ulcer Post Debridement: Improved Post Procedure Diagnosis Same as Pre-procedure Electronic Signature(s) Signed: 04/06/2020 5:15:15 PM By: Baruch Gouty RN, BSN Signed: 04/12/2020 8:09:40 AM By: Linton Ham MD Entered By: Linton Ham on 04/06/2020 13:53:52 -------------------------------------------------------------------------------- HPI Details Patient Name: Date of Service: Judith Cheshire. 04/06/2020 1:00 PM Medical Record Number: [id number] Patient Account Number: [id number] Date of Birth/Sex: Treating RN: [birth year] (86 y.o. [name] Primary Care Provider: Velna Hatchet Other Clinician: Referring Provider: Treating Provider/Extender: Wendall Mola in Treatment: 1 History of Present Illness Location: left anterior shin to wounds about 2 months ago and on the left lateral calf 1 wound about 1 week ago Quality: Patient reports experiencing a dull pain to affected area(s). Severity: Patient states wound(s) are getting worse. Duration: Patient has had the wound for > 2 months prior to seeking treatment at the wound center Timing: Pain in wound is Intermittent (comes and goes Context: The wound occurred when the patient had a blunt object hit her anterior shin and she tore her skin on the left lateral calf while she was wearing her trousers Modifying Factors: Patient wound(s)/ulcer(s) are worsening due to :swelling and redness and discharge ssociated Signs and Symptoms: Patient reports having increase swelling. A HPI Description: 86 year old patient was seen at her PCPs office at [place] for her left shin injury about a month ago. was seen in the ER for redness and drainage. She was started on Keflex 4 times a day and was then seen in follow-up in the office. Past medical history for GERD, right sciatica, COPD, DVT left lower extremity in May 2016. Also known to have a left femoral artery occlusion with collaterals and was seen in May 2016 by [facility]. He is status post bilateral knee arthroplasty, appendectomy, TAH/BSO in 2000. The patient was seen again in follow-up on 12/12/2016 with difficulty to heal wound due to peripheral artery disease and venous disease and there started on doxycycline 100 mg twice a day for 10 days and referred her to the wound center for an opinion. the patient has had a right lower extremity venous Doppler ultrasound and no evidence of DVT was found in the right lower extremity in July 2018. in May 2016 she had a lower extremity venous duplex evaluation  done and there was deep venous reflux observed throughout the left lower extremity but there was no saphenous reflux. There was  also focal deep vein thrombosis in one of the paired posterior tibial veins in the left calf. lower arterial study done in September 2015 showed right ABI of 1.08 and the left ABI of 0.87 with right toe brachial index of 0.71 in left toe brachial index of 0.87. 12/31/2016 -- had a lower extremity venous duplex reflux evaluation done which shows no evidence of bilateral lower extremity DVT There was reflux in . bilateral common femoral veins and left popliteal vein. Reflux in the right saphenofemoral junction and proximal great saphenous vein and in the great saphenous vein at the medial knee and proximal calf. Reflux in the right small saphenous vein midsegment. Reflux in the left saphenofemoral junction only. No reflux observed in the great saphenous vein and small saphenous vein. lower extremity arterial study done showed a left ABI -- 0.76 suggestive of moderate arterial occlusive disease and the right ABI -- 0.93 was within normal limits. toe brachial indices were 0.58 on the right and 0.50 on the left which were both abnormal. 01/07/2017 -- her PCP manage to get her an appointment to see Dr. Donnetta Hutching, tomorrow. Dr. Ardeth Perfect also said she should not be on any Lasix 01/14/2017 -- was seen by Dr. Althea Charon -- who reviewed the data which showed she had reflux in the right leg in the common femoral vein and femoral vein odd and on the left side throughout the common femoral vein and femoral vein and popliteal vein. Arterial studies revealed normal ABIs on the right and a reduced ABI on the left with biphasic waveform. He did not find any correctable superficial reflux and she did have mild arterial insufficiency but felt that it was adequate flow for healing these venous stasis ulcers without revascularization attempts. If she had progressive tissue loss she could undergo  arteriography. He would see her back as needed 05/05/17 READMISSION This is a patient who is seen here earlier in the year for a wound on the left anterior lower leg. She had reflux studies and arterial studies done at that time and she saw Dr. early. Dr. Tawni Millers did not feel she had correctable superficial reflux. As far as arterial studies were concerned she had a left ABI of 0.76 a right ABI of 0.93 a TBI on the left at 0.50 and on the right at 0.5 date. She was felt to have mild arterial disease but blood flow was felt to be adequate for healing. The patient had a fall on November 30 causing wounds on the right lateral lower leg and the right mid humeral level. She is simply been covering these although recently she was put on cephalexin by her primary physician. She is here for review of these wounds. She has been compliant with the compression stockings that were ordered during her last admission here. These are 20-30 mm 05/12/17; we put Santyl on her right leg wound and wrapped her and the wound actually looks quite a bit healthier today. There is been very significant reduction in the skin tear in her right mid humerus. We xeroform The patient requests not to have her leg wrap this week, she has 20-30 mm compression stockings and would like me to prescribe Santyl so she can change the dressing even though I clearly explained it is likely we'll be changing to silver collagen or Hydrofera Blue next week 05/19/17; patient is changing her own dressing using Santyl on the right leg and Xeroform on the arm the remaining small areas on the arm are looking epithelialized  I don't think the Xeroform is necessary simply cover with a foam border. Still using Santyl to the right leg. No change in dimensions 05/26/17; the patient's wound on her arm has closed over. Changed her to [place] last week from [place]. She still has a non viable surface today unfortunately 06/02/17; the patient's right arm wound remains  closed over. Changed her to [facility] last week after a vigorous debridement of the right anterior lower leg wound. Surface looks better 06/09/17; the patient's right arm remains closed. She is been on Prisma now for 2 weeks with hydrogel. 06/16/17; traumatic wound on the right anterior leg. I switched 3 weeks ago to [facility] from [place] although we've made no real progress. Still a gritty surface. I changed her to Iodoflex today. She is still using her on 20-30 mm stockings 06/23/17; traumatic wound on the right anterior leg in the setting of chronic venous insufficiency. Changed her to Iodoflex last week because of continued need for surface debridement. She arrives today with a better-looking wound surface. She is using her own compression stockings 06/30/17; traumatic wound on the right anterior leg in the setting of chronic venous insufficiency. Much better-looking wound surface. Will change to [facility] today. She is using her own compression stockings and the edema control looks adequate 07/07/17; traumatic wound on the right anterior leg in the setting of chronic venous insufficiency. Slightly smaller today using Hydrofera Blue 07/14/17-she is here for evaluation for right lower extremity ulcer. There is improvement in measurements and appearance. We will continue with same treatment plan and follow-up next week 07/23/17; right lower extremity ulcer. Continued improvement in measurements in appearance. Under the bright light there is still some surface slough although I elected not to debrided this today as the dimensions are significantly improved. We use Hydrofera Blue 08/04/17; right lower extremity traumatic ulcer in the setting of chronic venous insufficiency. Somewhat surprisingly she is healed today. T otally epithelialized. She has 20-30 mm compression stockings READMISSION 03/30/2020 Judith Boone is a now an 86 year old woman who was in this clinic in 2018 and then for 3 months in 2019 at which  time she had had a fall and had a wound on her right leg. She was discharged with 20/30 stockings. She tells me that her problem here began in September [place] rubbing the posterior left calf on the steps. She came home with a "large bruise". This eventually opened. She is on Eliquis. She was seen by her primary on 01/29/2020 at which time she had left leg swelling a DVT rule out was negative she was given doxycycline. There was no mention of an actual wound. At some point this area actually opened. She simply been applying a Band-Aid over it currently and presented with 4 small interconnecting wounds on the posterior calf however there was 0.6 cm in depth and undermining. Past medical history; chronic venous insufficiency, paroxysmal A. fib on Eliquis, COPD, lumbar spondylosis, hyperlipidemia, chronic renal failure stage III hypertension ABI in our clinic was 0.52. Her last noninvasive studies were in 2018. At that point on the left her ABI was 0.76 her TBI was 0.50. She had a extensive evaluation of her vascular status in September 2018 by Dr. Darene Lamer early. It was generally felt that she had enough blood flow to heal the predominantly venous odd ulcers on her left lower leg. He was also noted that she had very significant venous hypertension related to deep  venous reflux. At that point she did not have any correctable superficial reflux. Dr. Donnetta Hutching assessed her dorsalis pedis and posterior tibial with a hand-held Doppler and he felt that her flow was good 12/10; patient I admitted to the clinic last week. She had the remanence of a complex hematoma. Interconnecting wounds on the posterior left calf. I removed the connecting skin to fully expose the wound. We have been using Iodoflex but only under kerlix Coban because of coexistent PAD [see above] Electronic Signature(s) Signed: 04/12/2020 8:09:40 AM By: Linton Ham MD Entered By: Linton Ham on 04/06/2020 13:54:53 -------------------------------------------------------------------------------- Physical Exam Details Patient Name: Date of Service: Judith Boone. 04/06/2020 1:00 PM Medical Record Number: [id number] Patient Account Number: [id number] Date of Birth/Sex: Treating RN: 10-30-1931 (86 y.o. [name] Primary Care Provider: Velna Hatchet Other Clinician: Referring Provider: Treating Provider/Extender: Wendall Mola in Treatment: 1 Constitutional Sitting or standing Blood Pressure is within target range for patient.. Pulse regular and within target range for patient.Marland Kitchen Respirations regular, non-labored and within target range.. Temperature is normal and within the target range for the patient.Marland Kitchen Appears in no distress. Cardiovascular Pulses are difficult to feel. Edema control is marginal. Notes Wound exam; left posterior mid calf. Debris on the surface of this I used a #3 curette to remove this I had to do several layers but I was able to get down to what I consider to be healthy looking bleeding tissue. No evidence of surrounding infection. Electronic Signature(s) Signed: 04/12/2020 8:09:40 AM By: Linton Ham MD Entered By: Linton Ham on 04/06/2020 13:56:09 -------------------------------------------------------------------------------- Physician Orders Details Patient Name: Date of Service: Judith Cheshire. 04/06/2020 1:00 PM Medical Record Number: [id number] Patient Account Number: [id number] Date of Birth/Sex: Treating RN: Sep 06, 1931 (86 y.o. [name] Primary Care Provider: Velna Hatchet Other Clinician: Referring Provider: Treating Provider/Extender: Wendall Mola in Treatment: 1 Verbal / Phone Orders: No Diagnosis Coding ICD-10 Coding Code Description S80.12XD Contusion of left lower leg, subsequent encounter L97.222 Non-pressure chronic ulcer of left calf with fat layer  exposed I70.242 Atherosclerosis of native arteries of left leg with ulceration of calf Follow-up Appointments Return Appointment in 1 week. Bathing/ Shower/ Hygiene May shower with protection but do not get wound dressing(s) wet. - may use cast protector to cover wrap to shower Edema Control - Lymphedema / SCD / Other Bilateral Lower Extremities Elevate legs to the level of the heart or above for 30 minutes daily and/or when sitting, a frequency of: Avoid standing for long periods of time. Exercise regularly Wound Treatment Wound #6 - Lower Leg Wound Laterality: Left, Posterior Peri-Wound Care: Sween Lotion (Moisturizing lotion) 1 x Per Week Discharge Instructions: Apply moisturizing lotion as directed Prim Dressing: IODOFLEX 0.9% Cadexomer Iodine Pad 4x6 cm 1 x Per Week ary Discharge Instructions: Apply to wound bed as instructed Secondary Dressing: Woven Gauze Sponge, Non-Sterile 4x4 in 1 x Per Week Discharge Instructions: Apply over primary dressing as directed. Compression Wrap: Kerlix Roll 4.5x3.1 (in/yd) 1 x Per Week Discharge Instructions: Apply Kerlix and Coban compression as directed. Compression Wrap: Coban Self-Adherent Wrap 4x5 (in/yd) 1 x Per Week Discharge Instructions: Apply over Kerlix as directed. NOT TOO TIGHT!! Electronic Signature(s) Signed: 04/06/2020 5:15:15 PM By: Baruch Gouty RN, BSN Signed: 04/12/2020 8:09:40 AM By: Linton Ham MD Entered By: Baruch Gouty on 04/06/2020 13:46:17 -------------------------------------------------------------------------------- Problem List Details Patient Name: Date of Service: Judith Boone, [name]. 04/06/2020 1:00 PM Medical Record Number:  [id number] Patient Account Number: [id number] Date of Birth/Sex: Treating RN: December 26, 1931 (86 y.o. [name] Primary Care Provider: Velna Hatchet Other Clinician: Referring Provider: Treating Provider/Extender: Wendall Mola in Treatment: 1 Active  Problems ICD-10 Encounter Code Description Active Date MDM Diagnosis S80.12XD Contusion of left lower leg, subsequent encounter 03/30/2020 No Yes L97.222 Non-pressure chronic ulcer of left calf with fat layer exposed 03/30/2020 No Yes I70.242 Atherosclerosis of native arteries of left leg with ulceration of calf 03/30/2020 No Yes Inactive Problems Resolved Problems Electronic Signature(s) Signed: 04/12/2020 8:09:40 AM By: Linton Ham MD Entered By: Linton Ham on 04/06/2020 13:53:35 -------------------------------------------------------------------------------- Progress Note Details Patient Name: Date of Service: Judith Cheshire. 04/06/2020 1:00 PM Medical Record Number: [id number] Patient Account Number: [id number] Date of Birth/Sex: Treating RN: November 28, 1931 (86 y.o. [name] Primary Care Provider: Velna Hatchet Other Clinician: Referring Provider: Treating Provider/Extender: Wendall Mola in Treatment: 1 Subjective History of Present Illness (HPI) The following HPI elements were documented for the patient's wound: Location: left anterior shin to wounds about 2 months ago and on the left lateral calf 1 wound about 1 week ago Quality: Patient reports experiencing a dull pain to affected area(s). Severity: Patient states wound(s) are getting worse. Duration: Patient has had the wound for > 2 months prior to seeking treatment at the wound center Timing: Pain in wound is Intermittent (comes and goes Context: The wound occurred when the patient had a blunt object hit her anterior shin and she tore her skin on the left lateral calf while she was wearing her trousers Modifying Factors: Patient wound(s)/ulcer(s) are worsening due to :swelling and redness and discharge Associated Signs and Symptoms: Patient reports having increase swelling. 86 year old patient was seen at her PCPs office at [place] for her left shin injury about a  month ago. was seen in the ER for redness and drainage. She was started on Keflex 4 times a day and was then seen in follow-up in the office. Past medical history for GERD, right sciatica, COPD, DVT left lower extremity in May 2016. Also known to have a left femoral artery occlusion with collaterals and was seen in May 2016 by [facility]. He is status post bilateral knee arthroplasty, appendectomy, TAH/BSO in 2000. The patient was seen again in follow-up on 12/12/2016 with difficulty to heal wound due to peripheral artery disease and venous disease and there started on doxycycline 100 mg twice a day for 10 days and referred her to the wound center for an opinion. the patient has had a right lower extremity venous Doppler ultrasound and no evidence of DVT was found in the right lower extremity in July 2018. in May 2016 she had a lower extremity venous duplex evaluation done and there was deep venous reflux observed throughout the left lower extremity but there was no saphenous reflux. There was also focal deep vein thrombosis in one of the paired posterior tibial veins in the left calf. lower arterial study done in September 2015 showed right ABI of 1.08 and the left ABI of 0.87 with right toe brachial index of 0.71 in left toe brachial index of 0.87. 12/31/2016 -- had a lower extremity venous duplex reflux evaluation done which shows no evidence of bilateral lower extremity DVT There was reflux in . bilateral common femoral veins and left popliteal vein. Reflux in the right saphenofemoral junction and proximal great saphenous vein and in the great saphenous vein at the medial knee and proximal calf.  Reflux in the right small saphenous vein midsegment. Reflux in the left saphenofemoral junction only. No reflux observed in the great saphenous vein and small saphenous vein. lower extremity arterial study done showed a left ABI -- 0.76 suggestive of moderate arterial occlusive disease and the right ABI -- 0.93  was within normal limits. toe brachial indices were 0.58 on the right and 0.50 on the left which were both abnormal. 01/07/2017 -- her PCP manage to get her an appointment to see Dr. Donnetta Hutching, tomorrow. Dr. Ardeth Perfect also said she should not be on any Lasix 01/14/2017 -- was seen by Dr. Althea Charon -- who reviewed the data which showed she had reflux in the right leg in the common femoral vein and femoral vein odd and on the left side throughout the common femoral vein and femoral vein and popliteal vein. Arterial studies revealed normal ABIs on the right and a reduced ABI on the left with biphasic waveform. He did not find any correctable superficial reflux and she did have mild arterial insufficiency but felt that it was adequate flow for healing these venous stasis ulcers without revascularization attempts. If she had progressive tissue loss she could undergo arteriography. He would see her back as needed 05/05/17 READMISSION This is a patient who is seen here earlier in the year for a wound on the left anterior lower leg. She had reflux studies and arterial studies done at that time and she saw Dr. early. Dr. Tawni Millers did not feel she had correctable superficial reflux. As far as arterial studies were concerned she had a left ABI of 0.76 a right ABI of 0.93 a TBI on the left at 0.50 and on the right at 0.5 date. She was felt to have mild arterial disease but blood flow was felt to be adequate for healing. The patient had a fall on November 30 causing wounds on the right lateral lower leg and the right mid humeral level. She is simply been covering these although recently she was put on cephalexin by her primary physician. She is here for review of these wounds. She has been compliant with the compression stockings that were ordered during her last admission here. These are 20-30 mm 05/12/17; we put Santyl on her right leg wound and wrapped her and the wound actually looks quite a bit healthier today. There is  been very significant reduction in the skin tear in her right mid humerus. We xeroform The patient requests not to have her leg wrap this week, she has 20-30 mm compression stockings and would like me to prescribe Santyl so she can change the dressing even though I clearly explained it is likely we'll be changing to silver collagen or Hydrofera Blue next week 05/19/17; patient is changing her own dressing using Santyl on the right leg and Xeroform on the arm the remaining small areas on the arm are looking epithelialized I don't think the Xeroform is necessary simply cover with a foam border. Still using Santyl to the right leg. No change in dimensions 05/26/17; the patient's wound on her arm has closed over. Changed her to [place] last week from [place]. She still has a non viable surface today unfortunately 06/02/17; the patient's right arm wound remains closed over. Changed her to [facility] last week after a vigorous debridement of the right anterior lower leg wound. Surface looks better 06/09/17; the patient's right arm remains closed. She is been on Prisma now for 2 weeks with hydrogel. 06/16/17; traumatic wound on the right anterior  leg. I switched 3 weeks ago to [facility] from [place] although we've made no real progress. Still a gritty surface. I changed her to Iodoflex today. She is still using her on 20-30 mm stockings 06/23/17; traumatic wound on the right anterior leg in the setting of chronic venous insufficiency. Changed her to Iodoflex last week because of continued need for surface debridement. She arrives today with a better-looking wound surface. She is using her own compression stockings 06/30/17; traumatic wound on the right anterior leg in the setting of chronic venous insufficiency. Much better-looking wound surface. Will change to [facility] today. She is using her own compression stockings and the edema control looks adequate 07/07/17; traumatic wound on the right anterior leg in the setting  of chronic venous insufficiency. Slightly smaller today using Hydrofera Blue 07/14/17-she is here for evaluation for right lower extremity ulcer. There is improvement in measurements and appearance. We will continue with same treatment plan and follow-up next week 07/23/17; right lower extremity ulcer. Continued improvement in measurements in appearance. Under the bright light there is still some surface slough although I elected not to debrided this today as the dimensions are significantly improved. We use Hydrofera Blue 08/04/17; right lower extremity traumatic ulcer in the setting of chronic venous insufficiency. Somewhat surprisingly she is healed today. T otally epithelialized. She has 20-30 mm compression stockings READMISSION 03/30/2020 Judith Boone is a now an 86 year old woman who was in this clinic in 2018 and then for 3 months in 2019 at which time she had had a fall and had a wound on her right leg. She was discharged with 20/30 stockings. She tells me that her problem here began in September [place] apparently rubbing the posterior left calf on the steps. She came home with a "large bruise". This eventually opened. She is on Eliquis. She was seen by her primary on 01/29/2020 at which time she had left leg swelling a DVT rule out was negative she was given doxycycline. There was no mention of an actual wound. At some point this area actually opened. She simply been applying a Band-Aid over it currently and presented with 4 small interconnecting wounds on the posterior calf however there was 0.6 cm in depth and undermining. Past medical history; chronic venous insufficiency, paroxysmal A. fib on Eliquis, COPD, lumbar spondylosis, hyperlipidemia, chronic renal failure stage III hypertension ABI in our clinic was 0.52. Her last noninvasive studies were in 2018. At that point on the left her ABI was 0.76 her TBI was 0.50. She had a  extensive evaluation of her vascular status in September 2018 by Dr. Darene Lamer early. It was generally felt that she had enough blood flow to heal the predominantly venous odd ulcers on her left lower leg. He was also noted that she had very significant venous hypertension related to deep venous reflux. At that point she did not have any correctable superficial reflux. Dr. Donnetta Hutching assessed her dorsalis pedis and posterior tibial with a hand-held Doppler and he felt that her flow was good 12/10; patient I admitted to the clinic last week. She had the remanence of a complex hematoma. Interconnecting wounds on the posterior left calf. I removed the connecting skin to fully expose the wound. We have been using Iodoflex but only under kerlix Coban because of coexistent PAD [see above] Objective Constitutional Sitting or standing Blood Pressure is within target range for patient.. Pulse regular and within target range for patient.Marland Kitchen  Respirations regular, non-labored and within target range.. Temperature is normal and within the target range for the patient.Marland Kitchen Appears in no distress. Vitals Time Taken: 1:26 PM, Height: 60 in, Weight: 138 lbs, BMI: 26.9, Temperature: 98.2 F, Pulse: 87 bpm, Respiratory Rate: 18 breaths/min, Blood Pressure: 135/72 mmHg. Cardiovascular Pulses are difficult to feel. Edema control is marginal. General Notes: Wound exam; left posterior mid calf. Debris on the surface of this I used a #3 curette to remove this I had to do several layers but I was able to get down to what I consider to be healthy looking bleeding tissue. No evidence of surrounding infection. Integumentary (Hair, Skin) Wound #6 status is Open. Original cause of wound was Trauma. The wound is located on the Left,Posterior Lower Leg. The wound measures 1.1cm length x 1.4cm width x 0.3cm depth; 1.21cm^2 area and 0.363cm^3 volume. There is Fat Layer (Subcutaneous Tissue) exposed. There is no tunneling or undermining noted.  There is a medium amount of serosanguineous drainage noted. There is small (1-33%) red granulation within the wound bed. There is a large (67-100%) amount of necrotic tissue within the wound bed including Adherent Slough. 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 There was a Excisional Skin/Subcutaneous Tissue Debridement with a total area of 1.54 sq cm performed by Ricard Dillon., MD. With the following instrument(s): Curette to remove Viable and Non-Viable tissue/material. Material removed includes Subcutaneous Tissue and Slough and after achieving pain control using Other (Benzocaine 20%). No specimens were taken. A time out was conducted at 13:50, prior to the start of the procedure. A Minimum amount of bleeding was controlled with Pressure. The procedure was tolerated well with a pain level of 7 throughout and a pain level of 4 following the procedure. Post Debridement Measurements: 1.1cm length x 1.4cm width x 0.3cm depth; 0.363cm^3 volume. Character of Wound/Ulcer Post Debridement is improved. Post procedure Diagnosis Wound #6: Same as Pre-Procedure Plan Follow-up Appointments: Return Appointment in 1 week. Bathing/ Shower/ Hygiene: May shower with protection but do not get wound dressing(s) wet. - may use cast protector to cover wrap to shower Edema Control - Lymphedema / SCD / Other: Elevate legs to the level of the heart or above for 30 minutes daily and/or when sitting, a frequency of: Avoid standing for long periods of time. Exercise regularly WOUND #6: - Lower Leg Wound Laterality: Left, Posterior Peri-Wound Care: Sween Lotion (Moisturizing lotion) 1 x Per Week/ Discharge  Instructions: Apply moisturizing lotion as directed Prim Dressing: IODOFLEX 0.9% Cadexomer Iodine Pad 4x6 cm 1 x Per Week/ ary Discharge Instructions: Apply to wound bed as instructed Secondary Dressing: Woven Gauze Sponge, Non-Sterile 4x4 in 1 x Per Week/ Discharge Instructions: Apply over primary dressing as directed. Com pression Wrap: Kerlix Roll 4.5x3.1 (in/yd) 1 x Per Week/ Discharge Instructions: Apply Kerlix and Coban compression as directed. Com pression Wrap: Coban Self-Adherent Wrap 4x5 (in/yd) 1 x Per Week/ Discharge Instructions: Apply over Kerlix as directed. NOT TOO TIGHT!! 1. Continuing the Iodoflex under kerlix Coban although the latter was objective to by the patient. I explained to her she is going to need the compression to control the swelling 2. Hopefully the last mechanical debridement will have to do. The surface of this looks a lot  better today. Still use Iodoflex for another week. Consider silver collagen or endoform hopeful Electronic Signature(s) Signed: 04/12/2020 8:09:40 AM By: Linton Ham MD Entered By: Linton Ham on 04/06/2020 13:57:06 -------------------------------------------------------------------------------- SuperBill Details Patient Name: Date of Service: Judith Cheshire 04/06/2020 Medical Record Number: [id number] Patient Account Number: [id number] Date of Birth/Sex: Treating RN: 11/03/1931 (86 y.o. 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

## 2020-04-13 ENCOUNTER — Encounter (HOSPITAL_BASED_OUTPATIENT_CLINIC_OR_DEPARTMENT_OTHER): Payer: Medicare Other | Admitting: Internal Medicine

## 2020-04-13 ENCOUNTER — Other Ambulatory Visit: Payer: Self-pay

## 2020-04-13 DIAGNOSIS — S81812A Laceration without foreign body, left lower leg, initial encounter: Secondary | ICD-10-CM | POA: Diagnosis not present

## 2020-04-13 DIAGNOSIS — I70242 Atherosclerosis of native arteries of left leg with ulceration of calf: Secondary | ICD-10-CM | POA: Diagnosis not present

## 2020-04-13 DIAGNOSIS — L97222 Non-pressure chronic ulcer of left calf with fat layer exposed: Secondary | ICD-10-CM | POA: Diagnosis not present

## 2020-04-13 DIAGNOSIS — I872 Venous insufficiency (chronic) (peripheral): Secondary | ICD-10-CM | POA: Diagnosis not present

## 2020-04-13 DIAGNOSIS — S8012XA Contusion of left lower leg, initial encounter: Secondary | ICD-10-CM | POA: Diagnosis not present

## 2020-04-13 DIAGNOSIS — L97822 Non-pressure chronic ulcer of other part of left lower leg with fat layer exposed: Secondary | ICD-10-CM | POA: Diagnosis not present

## 2020-04-13 NOTE — Progress Notes (Signed)
Judith, Boone ([id number]) Visit Report for 04/13/2020 Debridement Details Patient Name: Date of Service: [name] 04/13/2020 10:30 A M Medical Record Number: [id number] Patient Account Number: [id number] Date of Birth/Sex: Treating RN: [birth year] (86 y.o. Judith Boone Primary Care Provider: Velna Hatchet Other Clinician: Referring Provider: Treating Provider/Extender: Wendall Mola in Treatment: 2 Debridement Performed for Assessment: Wound #6 Left,Posterior Lower Leg Performed By: Physician Ricard Dillon., MD Debridement Type: Debridement Level of Consciousness (Pre-procedure): Awake and Alert Pre-procedure Verification/Time Out Yes - 11:51 Taken: Start Time: 11:51 Pain Control: Other : Benzocaine 20% T Area Debrided (L x W): otal 1.3 (cm) x 1.3 (cm) = 1.69 (cm) Tissue and other material debrided: Viable, Non-Viable, Slough, Subcutaneous, Slough Level: Skin/Subcutaneous Tissue Debridement Description: Excisional Instrument: Curette Bleeding: Minimum Hemostasis Achieved: Pressure End Time: 11:52 Procedural Pain: 0 Post Procedural Pain: 0 Response to Treatment: Procedure was tolerated well Level of Consciousness (Post- Awake and Alert procedure): Post Debridement Measurements of Total Wound Length: (cm) 1.3 Width: (cm) 1.3 Depth: (cm) 0.3 Volume: (cm) 0.398 Character of Wound/Ulcer Post Debridement: Requires Further Debridement Post Procedure Diagnosis Same as Pre-procedure Electronic Signature(s) Signed: 04/13/2020 4:58:57 PM By: Linton Ham MD Signed: 04/13/2020 5:46:53 PM By: Levan Hurst RN, BSN Entered By: Linton Ham on 04/13/2020 12:53:08 -------------------------------------------------------------------------------- HPI Details Patient Name: Date of Service: Judith Boone. 04/13/2020 10:30 A M Medical Record Number: [id number] Patient Account Number: [id number] Date of Birth/Sex: Treating RN: 1932-02-26  (86 y.o. Judith Boone Primary Care Provider: Velna Hatchet Other Clinician: Referring Provider: Treating Provider/Extender: Wendall Mola in Treatment: 2 History of Present Illness Location: left anterior shin to wounds about 2 months ago and on the left lateral calf 1 wound about 1 week ago Quality: Patient reports experiencing a dull pain to affected area(s). Severity: Patient states wound(s) are getting worse. Duration: Patient has had the wound for > 2 months prior to seeking treatment at the wound center Timing: Pain in wound is Intermittent (comes and goes Context: The wound occurred when the patient had a blunt object hit her anterior shin and she tore her skin on the left lateral calf while she was wearing her trousers Modifying Factors: Patient wound(s)/ulcer(s) are worsening due to :swelling and redness and discharge ssociated Signs and Symptoms: Patient reports having increase swelling. A HPI Description: 86 year old patient was seen at her PCPs office at [place] for her left shin injury about a month ago. was seen in the ER for redness and drainage. She was started on Keflex 4 times a day and was then seen in follow-up in the office. Past medical history for GERD, right sciatica, COPD, DVT left lower extremity in May 2016. Also known to have a left femoral artery occlusion with collaterals and was seen in May 2016 by [facility]. He is status post bilateral knee arthroplasty, appendectomy, TAH/BSO in 2000. The patient was seen again in follow-up on 12/12/2016 with difficulty to heal wound due to peripheral artery disease and venous disease and there started on doxycycline 100 mg twice a day for 10 days and referred her to the wound center for an opinion. the patient has had a right lower extremity venous Doppler ultrasound and no evidence of DVT was found in the right lower extremity in July 2018. in May 2016 she had a lower extremity  venous duplex evaluation done and there was deep venous reflux observed throughout the left lower extremity but there was no  saphenous reflux. There was also focal deep vein thrombosis in one of the paired posterior tibial veins in the left calf. lower arterial study done in September 2015 showed right ABI of 1.08 and the left ABI of 0.87 with right toe brachial index of 0.71 in left toe brachial index of 0.87. 12/31/2016 -- had a lower extremity venous duplex reflux evaluation done which shows no evidence of bilateral lower extremity DVT There was reflux in . bilateral common femoral veins and left popliteal vein. Reflux in the right saphenofemoral junction and proximal great saphenous vein and in the great saphenous vein at the medial knee and proximal calf. Reflux in the right small saphenous vein midsegment. Reflux in the left saphenofemoral junction only. No reflux observed in the great saphenous vein and small saphenous vein. lower extremity arterial study done showed a left ABI -- 0.76 suggestive of moderate arterial occlusive disease and the right ABI -- 0.93 was within normal limits. toe brachial indices were 0.58 on the right and 0.50 on the left which were both abnormal. 01/07/2017 -- her PCP manage to get her an appointment to see Dr. Donnetta Hutching, tomorrow. Dr. Ardeth Perfect also said she should not be on any Lasix 01/14/2017 -- was seen by Dr. Althea Charon -- who reviewed the data which showed she had reflux in the right leg in the common femoral vein and femoral vein odd and on the left side throughout the common femoral vein and femoral vein and popliteal vein. Arterial studies revealed normal ABIs on the right and a reduced ABI on the left with biphasic waveform. He did not find any correctable superficial reflux and she did have mild arterial insufficiency but felt that it was adequate flow for healing these venous stasis ulcers without revascularization attempts. If she had progressive tissue loss  she could undergo arteriography. He would see her back as needed 05/05/17 READMISSION This is a patient who is seen here earlier in the year for a wound on the left anterior lower leg. She had reflux studies and arterial studies done at that time and she saw Dr. early. Dr. Tawni Millers did not feel she had correctable superficial reflux. As far as arterial studies were concerned she had a left ABI of 0.76 a right ABI of 0.93 a TBI on the left at 0.50 and on the right at 0.5 date. She was felt to have mild arterial disease but blood flow was felt to be adequate for healing. The patient had a fall on November 30 causing wounds on the right lateral lower leg and the right mid humeral level. She is simply been covering these although recently she was put on cephalexin by her primary physician. She is here for review of these wounds. She has been compliant with the compression stockings that were ordered during her last admission here. These are 20-30 mm 05/12/17; we put Santyl on her right leg wound and wrapped her and the wound actually looks quite a bit healthier today. There is been very significant reduction in the skin tear in her right mid humerus. We xeroform The patient requests not to have her leg wrap this week, she has 20-30 mm compression stockings and would like me to prescribe Santyl so she can change the dressing even though I clearly explained it is likely we'll be changing to silver collagen or Hydrofera Blue next week 05/19/17; patient is changing her own dressing using Santyl on the right leg and Xeroform on the arm the remaining small areas on the  arm are looking epithelialized I don't think the Xeroform is necessary simply cover with a foam border. Still using Santyl to the right leg. No change in dimensions 05/26/17; the patient's wound on her arm has closed over. Changed her to [place] last week from [place]. She still has a non viable surface today unfortunately 06/02/17; the patient's right arm  wound remains closed over. Changed her to [facility] last week after a vigorous debridement of the right anterior lower leg wound. Surface looks better 06/09/17; the patient's right arm remains closed. She is been on Prisma now for 2 weeks with hydrogel. 06/16/17; traumatic wound on the right anterior leg. I switched 3 weeks ago to [facility] from [place] although we've made no real progress. Still a gritty surface. I changed her to Iodoflex today. She is still using her on 20-30 mm stockings 06/23/17; traumatic wound on the right anterior leg in the setting of chronic venous insufficiency. Changed her to Iodoflex last week because of continued need for surface debridement. She arrives today with a better-looking wound surface. She is using her own compression stockings 06/30/17; traumatic wound on the right anterior leg in the setting of chronic venous insufficiency. Much better-looking wound surface. Will change to [facility] today. She is using her own compression stockings and the edema control looks adequate 07/07/17; traumatic wound on the right anterior leg in the setting of chronic venous insufficiency. Slightly smaller today using Hydrofera Blue 07/14/17-she is here for evaluation for right lower extremity ulcer. There is improvement in measurements and appearance. We will continue with same treatment plan and follow-up next week 07/23/17; right lower extremity ulcer. Continued improvement in measurements in appearance. Under the bright light there is still some surface slough although I elected not to debrided this today as the dimensions are significantly improved. We use Hydrofera Blue 08/04/17; right lower extremity traumatic ulcer in the setting of chronic venous insufficiency. Somewhat surprisingly she is healed today. T otally epithelialized. She has 20-30 mm compression stockings READMISSION 03/30/2020 Mrs. Newlon is a now an 86 year old woman who was in this clinic in 2018 and then for 3 months in  2019 at which time she had had a fall and had a wound on her right leg. She was discharged with 20/30 stockings. She tells me that her problem here began in September [place] rubbing the posterior left calf on the steps. She came home with a "large bruise". This eventually opened. She is on Eliquis. She was seen by her primary on 01/29/2020 at which time she had left leg swelling a DVT rule out was negative she was given doxycycline. There was no mention of an actual wound. At some point this area actually opened. She simply been applying a Band-Aid over it currently and presented with 4 small interconnecting wounds on the posterior calf however there was 0.6 cm in depth and undermining. Past medical history; chronic venous insufficiency, paroxysmal A. fib on Eliquis, COPD, lumbar spondylosis, hyperlipidemia, chronic renal failure stage III hypertension ABI in our clinic was 0.52. Her last noninvasive studies were in 2018. At that point on the left her ABI was 0.76 her TBI was 0.50. She had a extensive evaluation of her vascular status in September 2018 by Dr. Darene Lamer early. It was generally felt that she had enough blood flow to heal the predominantly venous odd ulcers on her left lower leg. He was also noted that she had very significant venous  hypertension related to deep venous reflux. At that point she did not have any correctable superficial reflux. Dr. Donnetta Hutching assessed her dorsalis pedis and posterior tibial with a hand-held Doppler and he felt that her flow was good 12/10; patient I admitted to the clinic last week. She had the remanence of a complex hematoma. Interconnecting wounds on the posterior left calf. I removed the connecting skin to fully expose the wound. 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 We have been using Iodoflex she has edema in her  legs she is very reluctant to continue the compression and really decided against that today. I am continuing the Iodoflex with border foam and using her own 20/30 mm stocking Electronic Signature(s) Signed: 04/13/2020 4:58:57 PM By: Linton Ham MD Entered By: Linton Ham on 04/13/2020 12:54:08 -------------------------------------------------------------------------------- Physical Exam Details Patient Name: Date of Service: Judith Boone. 04/13/2020 10:30 A M Medical Record Number: [id number] Patient Account Number: [id number] Date of Birth/Sex: Treating RN: 1932/04/14 (86 y.o. Judith Boone Primary Care Provider: Velna Hatchet Other Clinician: Referring Provider: Treating Provider/Extender: Wendall Mola in Treatment: 2 Constitutional Patient is hypertensive.. Pulse regular and within target range for patient.Marland Kitchen Respirations regular, non-labored and within target range.. Temperature is normal and within the target range for the patient.Marland Kitchen Appears in no distress. Notes Wound exam; left posterior mid calf. I remove debris on the surface of this with a #3 curette hemostasis with direct pressure. The wound itself looks better the surface is certainly a lot better there is no evidence of surrounding infection but the overall wound volume is about the same Electronic Signature(s) Signed: 04/13/2020 4:58:57 PM By: Linton Ham MD Entered By: Linton Ham on 04/13/2020 12:55:00 -------------------------------------------------------------------------------- Physician Orders Details Patient Name: Date of Service: Judith Boone. 04/13/2020 10:30 A M Medical Record Number: [id number] Patient Account Number: [id number] Date of Birth/Sex: Treating RN: 1931/12/26 (86 y.o. Judith Boone Primary Care Provider: Velna Hatchet Other Clinician: Referring Provider: Treating Provider/Extender: Wendall Mola in Treatment:  2 Verbal / Phone Orders: No Diagnosis Coding ICD-10 Coding Code Description S80.12XD Contusion of left lower leg, subsequent encounter L97.222 Non-pressure chronic ulcer of left calf with fat layer exposed I70.242 Atherosclerosis of native arteries of left leg with ulceration of calf Follow-up Appointments ppointment in 1 week. - Monday 12/27 Return A Bathing/ Shower/ Hygiene May shower and wash wound with soap and water. - on days that dressing is changed Edema Control - Lymphedema / SCD / Other Bilateral Lower Extremities Elevate legs to the level of the heart or above for 30 minutes daily and/or when sitting, a frequency of: - throughout the day Avoid standing for long periods of time. Exercise regularly Moisturize legs daily. Compression stocking or Garment 20-30 mm/Hg pressure to: - both legs daily Wound Treatment Wound #6 - Lower Leg Wound Laterality: Left, Posterior Cleanser: Soap and Water Every Other Day/10 Days Discharge Instructions: May shower and wash wound with dial antibacterial soap and water prior to dressing change. Peri-Wound Care: Sween Lotion (Moisturizing lotion) Every Other Day/10 Days Discharge Instructions: Apply moisturizing lotion as directed Prim Dressing: Iodosorb Gel 10 (gm) Tube (DME) (Generic) Every Other Day/10 Days ary Discharge Instructions: Apply to wound bed as instructed Secondary Dressing: Bordered Gauze, 4x4 in (DME) (Generic) Every Other Day/10 Days Discharge Instructions: Apply over primary dressing as directed. Electronic Signature(s) Signed: 04/13/2020 4:58:57 PM By:  Linton Ham MD Signed: 04/13/2020 5:46:53 PM By: Levan Hurst RN, BSN Entered By: Levan Hurst on 04/13/2020 12:01:37 -------------------------------------------------------------------------------- Problem List Details Patient Name: Date of Service: Judith Boone. 04/13/2020 10:30 A M Medical Record Number: [id number] Patient Account Number: [id number] Date of  Birth/Sex: Treating RN: [birth year] (86 y.o. Judith Boone Primary Care Provider: Velna Hatchet Other Clinician: Referring Provider: Treating Provider/Extender: Wendall Mola in Treatment: 2 Active Problems ICD-10 Encounter Code Description Active Date MDM Diagnosis S80.12XD Contusion of left lower leg, subsequent encounter 03/30/2020 No Yes L97.222 Non-pressure chronic ulcer of left calf with fat layer exposed 03/30/2020 No Yes I70.242 Atherosclerosis of native arteries of left leg with ulceration of calf 03/30/2020 No Yes Inactive Problems Resolved Problems Electronic Signature(s) Signed: 04/13/2020 4:58:57 PM By: Linton Ham MD Entered By: Linton Ham on 04/13/2020 12:52:27 -------------------------------------------------------------------------------- Progress Note Details Patient Name: Date of Service: Judith Boone. 04/13/2020 10:30 A M Medical Record Number: [id number] Patient Account Number: [id number] Date of Birth/Sex: Treating RN: Mar 25, 1932 (86 y.o. Judith Boone Primary Care Provider: Velna Hatchet Other Clinician: Referring Provider: Treating Provider/Extender: Wendall Mola in Treatment: 2 Subjective History of Present Illness (HPI) The following HPI elements were documented for the patient's wound: Location: left anterior shin to wounds about 2 months ago and on the left lateral calf 1 wound about 1 week ago Quality: Patient reports experiencing a dull pain to affected area(s). Severity: Patient states wound(s) are getting worse. Duration: Patient has had the wound for > 2 months prior to seeking treatment at the wound center Timing: Pain in wound is Intermittent (comes and goes Context: The wound occurred when the patient had a blunt object hit her anterior shin and she tore her skin on the left lateral calf while she was wearing her trousers Modifying Factors: Patient wound(s)/ulcer(s) are  worsening due to :swelling and redness and discharge Associated Signs and Symptoms: Patient reports having increase swelling. 86 year old patient was seen at her PCPs office at [place] for her left shin injury about a month ago. was seen in the ER for redness and drainage. She was started on Keflex 4 times a day and was then seen in follow-up in the office. Past medical history for GERD, right sciatica, COPD, DVT left lower extremity in May 2016. Also known to have a left femoral artery occlusion with collaterals and was seen in May 2016 by [facility]. He is status post bilateral knee arthroplasty, appendectomy, TAH/BSO in 2000. The patient was seen again in follow-up on 12/12/2016 with difficulty to heal wound due to peripheral artery disease and venous disease and there started on doxycycline 100 mg twice a day for 10 days and referred her to the wound center for an opinion. the patient has had a right lower extremity venous Doppler ultrasound and no evidence of DVT was found in the right lower extremity in July 2018. in May 2016 she had a lower extremity venous duplex evaluation done and there was deep venous reflux observed throughout the left lower extremity but there was no saphenous reflux. There was also focal deep vein thrombosis in one of the paired posterior tibial veins in the left calf. lower arterial study done in September 2015 showed right ABI of 1.08 and the left ABI of 0.87 with right toe brachial index of 0.71 in left toe brachial index of 0.87. 12/31/2016 -- had a lower extremity venous duplex reflux evaluation done which shows no evidence of  bilateral lower extremity DVT There was reflux in . bilateral common femoral veins and left popliteal vein. Reflux in the right saphenofemoral junction and proximal great saphenous vein and in the great saphenous vein at the medial knee and proximal calf. Reflux in the right small saphenous vein midsegment. Reflux in the left  saphenofemoral junction only. No reflux observed in the great saphenous vein and small saphenous vein. lower extremity arterial study done showed a left ABI -- 0.76 suggestive of moderate arterial occlusive disease and the right ABI -- 0.93 was within normal limits. toe brachial indices were 0.58 on the right and 0.50 on the left which were both abnormal. 01/07/2017 -- her PCP manage to get her an appointment to see Dr. Donnetta Hutching, tomorrow. Dr. Ardeth Perfect also said she should not be on any Lasix 01/14/2017 -- was seen by Dr. Althea Charon -- who reviewed the data which showed she had reflux in the right leg in the common femoral vein and femoral vein odd and on the left side throughout the common femoral vein and femoral vein and popliteal vein. Arterial studies revealed normal ABIs on the right and a reduced ABI on the left with biphasic waveform. He did not find any correctable superficial reflux and she did have mild arterial insufficiency but felt that it was adequate flow for healing these venous stasis ulcers without revascularization attempts. If she had progressive tissue loss she could undergo arteriography. He would see her back as needed 05/05/17 READMISSION This is a patient who is seen here earlier in the year for a wound on the left anterior lower leg. She had reflux studies and arterial studies done at that time and she saw Dr. early. Dr. Tawni Millers did not feel she had correctable superficial reflux. As far as arterial studies were concerned she had a left ABI of 0.76 a right ABI of 0.93 a TBI on the left at 0.50 and on the right at 0.5 date. She was felt to have mild arterial disease but blood flow was felt to be adequate for healing. The patient had a fall on November 30 causing wounds on the right lateral lower leg and the right mid humeral level. She is simply been covering these although recently she was put on cephalexin by her primary physician. She is here for review of these wounds. She has  been compliant with the compression stockings that were ordered during her last admission here. These are 20-30 mm 05/12/17; we put Santyl on her right leg wound and wrapped her and the wound actually looks quite a bit healthier today. There is been very significant reduction in the skin tear in her right mid humerus. We xeroform The patient requests not to have her leg wrap this week, she has 20-30 mm compression stockings and would like me to prescribe Santyl so she can change the dressing even though I clearly explained it is likely we'll be changing to silver collagen or Hydrofera Blue next week 05/19/17; patient is changing her own dressing using Santyl on the right leg and Xeroform on the arm the remaining small areas on the arm are looking epithelialized I don't think the Xeroform is necessary simply cover with a foam border. Still using Santyl to the right leg. No change in dimensions 05/26/17; the patient's wound on her arm has closed over. Changed her to [place] last week from [place]. She still has a non viable surface today unfortunately 06/02/17; the patient's right arm wound remains closed over. Changed her to [facility]  last week after a vigorous debridement of the right anterior lower leg wound. Surface looks better 06/09/17; the patient's right arm remains closed. She is been on Prisma now for 2 weeks with hydrogel. 06/16/17; traumatic wound on the right anterior leg. I switched 3 weeks ago to [facility] from [place] although we've made no real progress. Still a gritty surface. I changed her to Iodoflex today. She is still using her on 20-30 mm stockings 06/23/17; traumatic wound on the right anterior leg in the setting of chronic venous insufficiency. Changed her to Iodoflex last week because of continued need for surface debridement. She arrives today with a better-looking wound surface. She is using her own compression stockings 06/30/17; traumatic wound on the right anterior leg in the setting of  chronic venous insufficiency. Much better-looking wound surface. Will change to [facility] today. She is using her own compression stockings and the edema control looks adequate 07/07/17; traumatic wound on the right anterior leg in the setting of chronic venous insufficiency. Slightly smaller today using Hydrofera Blue 07/14/17-she is here for evaluation for right lower extremity ulcer. There is improvement in measurements and appearance. We will continue with same treatment plan and follow-up next week 07/23/17; right lower extremity ulcer. Continued improvement in measurements in appearance. Under the bright light there is still some surface slough although I elected not to debrided this today as the dimensions are significantly improved. We use Hydrofera Blue 08/04/17; right lower extremity traumatic ulcer in the setting of chronic venous insufficiency. Somewhat surprisingly she is healed today. T otally epithelialized. She has 20-30 mm compression stockings READMISSION 03/30/2020 Mrs. Judith Boone is a now an 86 year old woman who was in this clinic in 2018 and then for 3 months in 2019 at which time she had had a fall and had a wound on her right leg. She was discharged with 20/30 stockings. She tells me that her problem here began in September [place] apparently rubbing the posterior left calf on the steps. She came home with a "large bruise". This eventually opened. She is on Eliquis. She was seen by her primary on 01/29/2020 at which time she had left leg swelling a DVT rule out was negative she was given doxycycline. There was no mention of an actual wound. At some point this area actually opened. She simply been applying a Band-Aid over it currently and presented with 4 small interconnecting wounds on the posterior calf however there was 0.6 cm in depth and undermining. Past medical history; chronic venous insufficiency, paroxysmal A. fib on Eliquis,  COPD, lumbar spondylosis, hyperlipidemia, chronic renal failure stage III hypertension ABI in our clinic was 0.52. Her last noninvasive studies were in 2018. At that point on the left her ABI was 0.76 her TBI was 0.50. She had a extensive evaluation of her vascular status in September 2018 by Dr. Darene Lamer early. It was generally felt that she had enough blood flow to heal the predominantly venous odd ulcers on her left lower leg. He was also noted that she had very significant venous hypertension related to deep venous reflux. At that point she did not have any correctable superficial reflux. Dr. Donnetta Hutching assessed her dorsalis pedis and posterior tibial with a hand-held Doppler and he felt that her flow was good 12/10; patient I admitted to the clinic last week. She had the remanence of a complex hematoma. Interconnecting wounds on the posterior left calf. I removed the connecting skin to  fully expose the wound. 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 We have been using Iodoflex she has edema in her legs she is very reluctant to continue the compression and really decided against that today. I am continuing the Iodoflex with border foam and using her own 20/30 mm stocking Objective Constitutional Patient is hypertensive.. Pulse regular and within target range for patient.Marland Kitchen Respirations regular, non-labored and within target range.. Temperature is normal and within the target range for the patient.Marland Kitchen Appears in no distress. Vitals Time Taken: 11:32 AM, Height: 60 in, Weight: 138 lbs, BMI: 26.9, Temperature: 97.6 F, Pulse: 80 bpm, Respiratory Rate: 18 breaths/min, Blood Pressure: 158/77 mmHg. General Notes: Wound exam; left posterior mid calf. I remove debris on the surface of this with a #3 curette hemostasis with direct pressure. The wound itself looks better the surface is certainly a lot better there is no evidence  of surrounding infection but the overall wound volume is about the same Integumentary (Hair, Skin) Wound #6 status is Open. Original cause of wound was Trauma. The wound is located on the Left,Posterior Lower Leg. The wound measures 1.3cm length x 1.3cm width x 0.3cm depth; 1.327cm^2 area and 0.398cm^3 volume. There is Fat Layer (Subcutaneous Tissue) exposed. There is no tunneling or undermining noted. There is a medium amount of serosanguineous drainage noted. There is medium (34-66%) red granulation within the wound bed. There is a medium (34- 66%) amount of necrotic tissue within the wound bed including Adherent Slough. 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 There was a Excisional Skin/Subcutaneous Tissue Debridement with a total area of 1.69 sq cm performed by Ricard Dillon., MD. With the following instrument(s): Curette to remove Viable and Non-Viable tissue/material. Material removed includes Subcutaneous Tissue and Slough and after achieving pain control using Other (Benzocaine 20%). No specimens were taken. A time out was conducted at 11:51, prior to the start of the procedure. A Minimum amount of bleeding was controlled with Pressure. The procedure was tolerated well with a pain level of 0 throughout and a pain level of 0 following the procedure. Post Debridement Measurements: 1.3cm length x 1.3cm width x 0.3cm depth; 0.398cm^3 volume. Character of Wound/Ulcer Post Debridement requires further debridement. Post procedure Diagnosis Wound #6: Same as Pre-Procedure Plan Follow-up Appointments: Return Appointment in 1 week. - Monday 12/27 Bathing/ Shower/ Hygiene: May shower and wash wound  with soap and water. - on days that dressing is changed Edema Control - Lymphedema / SCD / Other: Elevate legs to the level of the heart or above for 30 minutes daily and/or when sitting, a frequency of: - throughout the day Avoid standing for long periods of time. Exercise regularly Moisturize legs daily. Compression stocking or Garment 20-30 mm/Hg pressure to: - both legs daily WOUND #6: - Lower Leg Wound Laterality: Left, Posterior Cleanser: Soap and Water Every Other Day/10 Days Discharge Instructions: May shower and wash wound with dial antibacterial soap and water prior to dressing change. Peri-Wound Care: Sween Lotion (Moisturizing lotion) Every Other Day/10 Days Discharge Instructions: Apply moisturizing lotion as directed Prim Dressing: Iodosorb Gel 10 (gm) Tube (DME) (Generic) Every Other Day/10 Days ary Discharge Instructions: Apply  to wound bed as instructed Secondary Dressing: Bordered Gauze, 4x4 in (DME) (Generic) Every Other Day/10 Days Discharge Instructions: Apply over primary dressing as directed. 1. I am continuing with the Iodoflex under border foam and her own 20/30 mm stocking. This is really against my advice I wanted to continue her in compression 2. We will see her back once over the holidays I hope to be able to change to a silver collagen-based dressing at that point Electronic Signature(s) Signed: 04/13/2020 4:58:57 PM By: Linton Ham MD Entered By: Linton Ham on 04/13/2020 12:55:48 -------------------------------------------------------------------------------- SuperBill Details Patient Name: Date of Service: Judith Boone 04/13/2020 Medical Record Number: [id number] Patient Account Number: [id number] Date of Birth/Sex: Treating RN: 02-07-1932 (86 y.o. 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

## 2020-04-13 NOTE — Progress Notes (Signed)
KALEB, LINQUIST ([id number]) Visit Report for 04/13/2020 Arrival Information Details Patient Name: Date of Service: [name] 04/13/2020 10:30 A M Medical Record Number: [id number] Patient Account Number: [id number] Date of Birth/Sex: Treating RN: 05-Sep-1931 (86 y.o. [name] Primary Care Oaklen Thiam: Velna Hatchet Other Clinician: Referring Ly Bacchi: Treating Jerusha Reising/Extender: Wendall Mola in Treatment: 2 Visit Information History Since Last Visit Added or deleted any medications: No Patient Arrived: Walker Any new allergies or adverse reactions: No Arrival Time: 11:31 Had a fall or experienced change in No Accompanied By: self activities of daily living that may affect Transfer Assistance: None risk of falls: Patient Identification Verified: Yes Signs or symptoms of abuse/neglect since last visito No Secondary Verification Process Completed: Yes Hospitalized since last visit: No Patient Requires Transmission-Based Precautions: No Implantable device outside of the clinic excluding No Patient Has Alerts: No cellular tissue based products placed in the center since last visit: Has Dressing in Place as Prescribed: Yes Pain Present Now: No Electronic Signature(s) Signed: 04/13/2020 11:46:37 AM By: Sandre Kitty Entered By: Sandre Kitty on 04/13/2020 11:32:59 -------------------------------------------------------------------------------- Encounter Discharge Information Details Patient Name: Date of Service: [name]. 04/13/2020 10:30 A M Medical Record Number: [id number] Patient Account Number: [id number] Date of Birth/Sex: Treating RN: December 07, 1931 (86 y.o. [name] Primary Care Shenell Rogalski: Velna Hatchet Other Clinician: Referring Delmar Dondero: Treating Caleb Prigmore/Extender: Wendall Mola in Treatment: 2 Encounter Discharge Information Items Post Procedure Vitals Discharge Condition:  Stable Temperature (F): 97.6 Ambulatory Status: Walker Pulse (bpm): 80 Discharge Destination: Home Respiratory Rate (breaths/min): 18 Transportation: Private Auto Blood Pressure (mmHg): 158/77 Accompanied By: self Schedule Follow-up Appointment: Yes Clinical Summary of Care: Electronic Signature(s) Signed: 04/13/2020 5:32:58 PM By: Deon Pilling Entered By: Deon Pilling on 04/13/2020 12:15:09 -------------------------------------------------------------------------------- Lower Extremity Assessment Details Patient Name: Date of Service: [name] 04/13/2020 10:30 A M Medical Record Number: [id number] Patient Account Number: [id number] Date of Birth/Sex: Treating RN: [birth year] (86 y.o. [name] Primary Care Mackenzy Eisenberg: Velna Hatchet Other Clinician: Referring Miracle Mongillo: Treating Citlally Captain/Extender: Wendall Mola in Treatment: 2 Edema Assessment Assessed: [Left: Yes] [Right: No] E[Left: dema] [Right: :] Calf Left: Right: Point of Measurement: 36 cm From Medial Instep 29 cm Ankle Left: Right: Point of Measurement: 10 cm From Medial Instep 21 cm Vascular Assessment Pulses: Dorsalis Pedis Palpable: [Left:Yes] Electronic Signature(s) Signed: 04/13/2020 5:32:58 PM By: Deon Pilling Entered By: Deon Pilling on 04/13/2020 11:43:59 -------------------------------------------------------------------------------- Multi Wound Chart Details Patient Name: Date of Service: [name]. 04/13/2020 10:30 A M Medical Record Number: [id number] Patient Account Number: [id number] Date of Birth/Sex: Treating RN: Aug 12, 1931 (86 y.o. [name] Primary Care Keiana Tavella: Velna Hatchet Other Clinician: Referring Burnie Hank: Treating Cataleia Gade/Extender: Wendall Mola in Treatment: 2 Vital Signs Height(in): 60 Pulse(bpm): 80 Weight(lbs): 138 Blood Pressure(mmHg): 158/77 Body Mass Index(BMI): 27 Temperature(F):  97.6 Respiratory Rate(breaths/min): 18 Photos: [6:No Photos Left, Posterior Lower Leg] [N/A:N/A N/A] Wound Location: [6:Trauma] [N/A:N/A] Wounding Event: [6:Inflammatory] [N/A:N/A] Primary Etiology: [6:Cataracts, Lymphedema,] [N/A:N/A] Comorbid History: [6:Hypertension, Peripheral Arterial Disease, Osteoarthritis 12/28/2019] [N/A:N/A] Date Acquired: [6:2] [N/A:N/A] Weeks of Treatment: [6:Open] [N/A:N/A] Wound Status: [6:1.3x1.3x0.3] [N/A:N/A] Measurements L x W x D (cm) [6:1.327] [N/A:N/A] A (cm) : rea [6:0.398] [N/A:N/A] Volume (cm) : [6:-87.70%] [N/A:N/A] % Reduction in Area: [6:6.10%] [N/A:N/A] % Reduction in Volume: [6:Full Thickness Without Exposed] [N/A:N/A] Classification: [6:Support Structures Medium] [N/A:N/A] Exudate A mount: [6:Serosanguineous] [N/A:N/A] Exudate Type: [6:red, brown] [N/A:N/A] Exudate Color: [6:Medium (  34-66%)] [N/A:N/A] Granulation A mount: [6:Red] [N/A:N/A] Granulation Quality: [6:Medium (34-66%)] [N/A:N/A] Necrotic A mount: [6:Fat Layer (Subcutaneous Tissue): Yes N/A] Exposed Structures: [6:Fascia: No Tendon: No Muscle: No Joint: No Bone: No Small (1-33%)] [N/A:N/A] Epithelialization: [6:Debridement - Excisional] [N/A:N/A] Debridement: Pre-procedure Verification/Time Out 11:51 [N/A:N/A] Taken: [6:Other] [N/A:N/A] Pain Control: [6:Subcutaneous, Slough] [N/A:N/A] Tissue Debrided: [6:Skin/Subcutaneous Tissue] [N/A:N/A] Level: [6:1.69] [N/A:N/A] Debridement A (sq cm): [6:rea Curette] [N/A:N/A] Instrument: [6:Minimum] [N/A:N/A] Bleeding: [6:Pressure] [N/A:N/A] Hemostasis A chieved: [6:0] [N/A:N/A] Procedural Pain: [6:0] [N/A:N/A] Post Procedural Pain: [6:Procedure was tolerated well] [N/A:N/A] Debridement Treatment Response: [6:1.3x1.3x0.3] [N/A:N/A] Post Debridement Measurements L x W x D (cm) [6:0.398] [N/A:N/A] Post Debridement Volume: (cm) [6:Debridement] [N/A:N/A] Treatment Notes Wound #6 (Lower Leg) Wound Laterality: Left,  Posterior Cleanser Soap and Water Discharge Instruction: May shower and wash wound with dial antibacterial soap and water prior to dressing change. Peri-Wound Care Sween Lotion (Moisturizing lotion) Discharge Instruction: Apply moisturizing lotion as directed Topical Primary Dressing Iodosorb Gel 10 (gm) Tube Discharge Instruction: Apply to wound bed as instructed Secondary Dressing Bordered Gauze, 4x4 in Discharge Instruction: Apply over primary dressing as directed. Secured With Compression Wrap Compression Stockings Environmental education officer) Signed: 04/13/2020 4:58:57 PM By: Linton Ham MD Signed: 04/13/2020 5:46:53 PM By: Levan Hurst RN, BSN Entered By: Linton Ham on 04/13/2020 12:52:57 -------------------------------------------------------------------------------- Multi-Disciplinary Care Plan Details Patient Name: Date of Service: [name], [name]. 04/13/2020 10:30 A M Medical Record Number: [id number] Patient Account Number: [id number] Date of Birth/Sex: Treating RN: 12-13-1931 (86 y.o. [name] Primary Care Suzannah Bettes: Velna Hatchet Other Clinician: Referring Render Marley: Treating Lawanda Holzheimer/Extender: Wendall Mola in Treatment: 2 Active Inactive Abuse / Safety / Falls / Self Care Management Nursing Diagnoses: Potential for falls Goals: Patient/caregiver will verbalize/demonstrate measures taken to prevent injury and/or falls Date Initiated: 03/30/2020 Target Resolution Date: 04/27/2020 Goal Status: Active Interventions: Assess fall risk on admission and as needed Notes: Venous Leg Ulcer Nursing Diagnoses: Knowledge deficit related to disease process and management Potential for venous Insuffiency (use before diagnosis confirmed) Goals: Patient will maintain optimal edema control Date Initiated: 03/30/2020 Target Resolution Date: 04/27/2020 Goal Status: Active Interventions: Assess peripheral edema status every  visit. Compression as ordered Provide education on venous insufficiency Treatment Activities: Therapeutic compression applied : 03/30/2020 Notes: Wound/Skin Impairment Nursing Diagnoses: Impaired tissue integrity Knowledge deficit related to ulceration/compromised skin integrity Goals: Patient/caregiver will verbalize understanding of skin care regimen Date Initiated: 03/30/2020 Target Resolution Date: 04/27/2020 Goal Status: Active Ulcer/skin breakdown will have a volume reduction of 30% by week 4 Date Initiated: 03/30/2020 Target Resolution Date: 04/27/2020 Goal Status: Active Interventions: Assess patient/caregiver ability to obtain necessary supplies Assess patient/caregiver ability to perform ulcer/skin care regimen upon admission and as needed Assess ulceration(s) every visit Provide education on ulcer and skin care Treatment Activities: Skin care regimen initiated : 03/30/2020 Topical wound management initiated : 03/30/2020 Notes: Electronic Signature(s) Signed: 04/13/2020 5:46:53 PM By: Levan Hurst RN, BSN Entered By: Levan Hurst on 04/13/2020 17:31:16 -------------------------------------------------------------------------------- Pain Assessment Details Patient Name: Date of Service: [name]. 04/13/2020 10:30 A M Medical Record Number: [id number] Patient Account Number: [id number] Date of Birth/Sex: Treating RN: [birth year] (86 y.o. [name] Primary Care Dayden Viverette: Velna Hatchet Other Clinician: Referring Tziporah Knoke: Treating Erika Hussar/Extender: Wendall Mola in Treatment: 2 Active Problems Location of Pain Severity and Description of Pain Patient Has Paino No Site Locations Pain Management and Medication Current Pain Management: Electronic Signature(s) Signed: 04/13/2020 11:46:37 AM By: Sandre Kitty Signed: 04/13/2020 5:46:53  PM By: Levan Hurst RN, BSN Entered By: Sandre Kitty on 04/13/2020  11:33:22 -------------------------------------------------------------------------------- Patient/Caregiver Education Details Patient Name: Date of Service: [name] 12/17/2021andnbsp10:30 A M Medical Record Number: [id number] Patient Account Number: [id number] Date of Birth/Gender: Treating RN: 10-Oct-1931 (86 y.o. [name] Primary Care Physician: Velna Hatchet Other Clinician: Referring Physician: Treating Physician/Extender: Wendall Mola in Treatment: 2 Education Assessment Education Provided To: Patient Education Topics Provided Wound/Skin Impairment: Methods: Explain/Verbal Responses: State content correctly Electronic Signature(s) Signed: 04/13/2020 5:46:53 PM By: Levan Hurst RN, BSN Entered By: Levan Hurst on 04/13/2020 17:31:25 -------------------------------------------------------------------------------- Wound Assessment Details Patient Name: Date of Service: [name]. 04/13/2020 10:30 A M Medical Record Number: [id number] Patient Account Number: [id number] Date of Birth/Sex: Treating RN: 04/04/1932 (86 y.o. [name] Primary Care Noelly Lasseigne: Velna Hatchet Other Clinician: Referring Ethelyne Erich: Treating Zacchaeus Halm/Extender: Wendall Mola in Treatment: 2 Wound Status Wound Number: 6 Primary Inflammatory Etiology: Wound Location: Left, Posterior Lower Leg Wound Open Wounding Event: Trauma Status: Date Acquired: 12/28/2019 Comorbid Cataracts, Lymphedema, Hypertension, Peripheral Arterial Weeks Of Treatment: 2 History: Disease, Osteoarthritis Clustered Wound: No Wound Measurements Length: (cm) 1.3 Width: (cm) 1.3 Depth: (cm) 0.3 Area: (cm) 1.327 Volume: (cm) 0.398 % Reduction in Area: -87.7% % Reduction in Volume: 6.1% Epithelialization: Small (1-33%) Tunneling: No Undermining: No Wound Description Classification: Full Thickness Without Exposed Support  Structures Exudate Amount: Medium Exudate Type: Serosanguineous Exudate Color: red, brown Foul Odor After Cleansing: No Slough/Fibrino Yes Wound Bed Granulation Amount: Medium (34-66%) Exposed Structure Granulation Quality: Red Fascia Exposed: No Necrotic Amount: Medium (34-66%) Fat Layer (Subcutaneous Tissue) Exposed: Yes Necrotic Quality: Adherent Slough Tendon Exposed: No Muscle Exposed: No Joint Exposed: No Bone Exposed: No Treatment Notes Wound #6 (Lower Leg) Wound Laterality: Left, Posterior Cleanser Soap and Water Discharge Instruction: May shower and wash wound with dial antibacterial soap and water prior to dressing change. Peri-Wound Care Sween Lotion (Moisturizing lotion) Discharge Instruction: Apply moisturizing lotion as directed Topical Primary Dressing Iodosorb Gel 10 (gm) Tube Discharge Instruction: Apply to wound bed as instructed Secondary Dressing Bordered Gauze, 4x4 in Discharge Instruction: Apply over primary dressing as directed. Secured With Compression Wrap Compression Stockings Environmental education officer) Signed: 04/13/2020 5:32:58 PM By: Deon Pilling Signed: 04/13/2020 5:46:53 PM By: Levan Hurst RN, BSN Entered By: Deon Pilling on 04/13/2020 11:44:17 -------------------------------------------------------------------------------- Vitals Details Patient Name: Date of Service: [name], [name]. 04/13/2020 10:30 A M Medical Record Number: [id number] Patient Account Number: [id number] Date of Birth/Sex: Treating RN: 01-Nov-1931 (86 y.o. [name] Primary Care Saylah Ketner: Velna Hatchet Other Clinician: Referring Ritvik Mczeal: Treating Bandy Honaker/Extender: Wendall Mola in Treatment: 2 Vital Signs Time Taken: 11:32 Temperature (F): 97.6 Height (in): 60 Pulse (bpm): 80 Weight (lbs): 138 Respiratory Rate (breaths/min): 18 Body Mass Index (BMI): 26.9 Blood Pressure (mmHg): 158/77 Reference Range: 80 - 120  mg / dl Electronic Signature(s) Signed: 04/13/2020 11:46:37 AM By: Sandre Kitty Entered By: Sandre Kitty on 04/13/2020 11:33:17

## 2020-04-23 ENCOUNTER — Encounter (HOSPITAL_BASED_OUTPATIENT_CLINIC_OR_DEPARTMENT_OTHER): Payer: Medicare Other | Admitting: Internal Medicine

## 2020-04-23 ENCOUNTER — Other Ambulatory Visit: Payer: Self-pay

## 2020-04-23 DIAGNOSIS — I70242 Atherosclerosis of native arteries of left leg with ulceration of calf: Secondary | ICD-10-CM | POA: Diagnosis not present

## 2020-04-23 DIAGNOSIS — L97822 Non-pressure chronic ulcer of other part of left lower leg with fat layer exposed: Secondary | ICD-10-CM | POA: Diagnosis not present

## 2020-04-23 DIAGNOSIS — I872 Venous insufficiency (chronic) (peripheral): Secondary | ICD-10-CM | POA: Diagnosis not present

## 2020-04-23 DIAGNOSIS — S8012XA Contusion of left lower leg, initial encounter: Secondary | ICD-10-CM | POA: Diagnosis not present

## 2020-04-23 DIAGNOSIS — S81812A Laceration without foreign body, left lower leg, initial encounter: Secondary | ICD-10-CM | POA: Diagnosis not present

## 2020-04-23 DIAGNOSIS — L97222 Non-pressure chronic ulcer of left calf with fat layer exposed: Secondary | ICD-10-CM | POA: Diagnosis not present

## 2020-04-24 NOTE — Progress Notes (Signed)
[name], [name] ([id number]) Visit Report for 04/23/2020 Arrival Information Details Patient Name: Date of Service: Judith Boone 04/23/2020 1:30 PM Medical Record Number: [id number] Patient Account Number: [id number] Date of Birth/Sex: Treating RN: 04/22/1932 (86 y.o. [name] Primary Care Shamere Dilworth: Velna Hatchet Other Clinician: Referring Mayjor Ager: Treating Zaidan Keeble/Extender: Wendall Mola in Treatment: 3 Visit Information History Since Last Visit Added or deleted any medications: No Patient Arrived: Ambulatory Any new allergies or adverse reactions: No Arrival Time: 13:28 Had a fall or experienced change in No Accompanied By: self activities of daily living that may affect Transfer Assistance: None risk of falls: Patient Requires Transmission-Based Precautions: No Signs or symptoms of abuse/neglect since last visito No Patient Has Alerts: No Hospitalized since last visit: No Implantable device outside of the clinic excluding No cellular tissue based products placed in the center since last visit: Has Dressing in Place as Prescribed: Yes Pain Present Now: No Electronic Signature(s) Signed: 04/24/2020 12:40:32 PM By: Sandre Kitty Entered By: Sandre Kitty on 04/23/2020 13:29:50 -------------------------------------------------------------------------------- Encounter Discharge Information Details Patient Name: Date of Service: Judith Boone. 04/23/2020 1:30 PM Medical Record Number: [id number] Patient Account Number: [id number] Date of Birth/Sex: Treating RN: 04/03/1932 (86 y.o. [name] Primary Care Gloriajean Okun: Velna Hatchet Other Clinician: Referring Tania Steinhauser: Treating Ascencion Stegner/Extender: Wendall Mola in Treatment: 3 Encounter Discharge Information Items Post Procedure Vitals Discharge Condition: Stable Temperature (F): 98.2 Ambulatory Status: Ambulatory Pulse (bpm): 80 Discharge Destination:  Home Respiratory Rate (breaths/min): 18 Transportation: Private Auto Blood Pressure (mmHg): 132/76 Accompanied By: self Schedule Follow-up Appointment: Yes Clinical Summary of Care: Electronic Signature(s) Signed: 04/23/2020 5:55:20 PM By: Deon Pilling Entered By: Deon Pilling on 04/23/2020 13:49:34 -------------------------------------------------------------------------------- Lower Extremity Assessment Details Patient Name: Date of Service: Judith Boone 04/23/2020 1:30 PM Medical Record Number: [id number] Patient Account Number: [id number] Date of Birth/Sex: Treating RN: 02/20/1932 (86 y.o. [name] Primary Care Sierah Lacewell: Velna Hatchet Other Clinician: Referring Aurore Redinger: Treating Soul Deveney/Extender: Wendall Mola in Treatment: 3 Edema Assessment Assessed: [Left: Yes] [Right: No] Edema: [Left: Ye] [Right: s] Calf Left: Right: Point of Measurement: 36 cm From Medial Instep 32 cm Ankle Left: Right: Point of Measurement: 10 cm From Medial Instep 22 cm Vascular Assessment Pulses: Dorsalis Pedis Palpable: [Left:Yes] Electronic Signature(s) Signed: 04/23/2020 5:55:20 PM By: Deon Pilling Entered By: Deon Pilling on 04/23/2020 13:47:51 -------------------------------------------------------------------------------- Multi Wound Chart Details Patient Name: Date of Service: Judith Boone. 04/23/2020 1:30 PM Medical Record Number: [id number] Patient Account Number: [id number] Date of Birth/Sex: Treating RN: 01/15/1932 (86 y.o. [name] Primary Care Tammera Engert: Velna Hatchet Other Clinician: Referring Judeen Geralds: Treating Clayten Allcock/Extender: Wendall Mola in Treatment: 3 Vital Signs Height(in): 60 Pulse(bpm): 80 Weight(lbs): 138 Blood Pressure(mmHg): 132/76 Body Mass Index(BMI): 27 Temperature(F): 98.2 Respiratory Rate(breaths/min): 18 Photos: [6:No Photos Left, Posterior Lower Leg] [N/A:N/A  N/A] Wound Location: [6:Trauma] [N/A:N/A] Wounding Event: [6:Inflammatory] [N/A:N/A] Primary Etiology: [6:Cataracts, Lymphedema,] [N/A:N/A] Comorbid History: [6:Hypertension, Peripheral Arterial Disease, Osteoarthritis 12/28/2019] [N/A:N/A] Date Acquired: [6:3] [N/A:N/A] Weeks of Treatment: [6:Open] [N/A:N/A] Wound Status: [6:0.9x1.5x0.2] [N/A:N/A] Measurements L x W x D (cm) [6:1.06] [N/A:N/A] A (cm) : rea [6:0.212] [N/A:N/A] Volume (cm) : [6:-49.90%] [N/A:N/A] % Reduction in Area: [6:50.00%] [N/A:N/A] % Reduction in Volume: [6:Full Thickness Without Exposed] [N/A:N/A] Classification: [6:Support Structures Medium] [N/A:N/A] Exudate A mount: [6:Serosanguineous] [N/A:N/A] Exudate Type: [6:red, brown] [N/A:N/A] Exudate Color: [6:Distinct, outline attached] [N/A:N/A] Wound Margin: [6:Medium (34-66%)] [N/A:N/A] Granulation A mount: [6:Red] [  N/A:N/A] Granulation Quality: [6:Medium (34-66%)] [N/A:N/A] Necrotic A mount: [6:Fat Layer (Subcutaneous Tissue): Yes N/A] Exposed Structures: [6:Fascia: No Tendon: No Muscle: No Joint: No Bone: No Small (1-33%)] [N/A:N/A] Epithelialization: [6:Debridement - Excisional] [N/A:N/A] Debridement: Pre-procedure Verification/Time Out 13:39 [N/A:N/A] Taken: [6:Lidocaine 4% Topical Solution] [N/A:N/A] Pain Control: [6:Subcutaneous, Slough] [N/A:N/A] Tissue Debrided: [6:Skin/Subcutaneous Tissue] [N/A:N/A] Level: [6:1.35] [N/A:N/A] Debridement A (sq cm): [6:rea Curette] [N/A:N/A] Instrument: [6:Minimum] [N/A:N/A] Bleeding: [6:Pressure] [N/A:N/A] Hemostasis A chieved: [6:0] [N/A:N/A] Procedural Pain: [6:0] [N/A:N/A] Post Procedural Pain: [6:Procedure was tolerated well] [N/A:N/A] Debridement Treatment Response: [6:0.9x1.5x0.2] [N/A:N/A] Post Debridement Measurements L x W x D (cm) [6:0.212] [N/A:N/A] Post Debridement Volume: (cm) [6:Debridement] [N/A:N/A] Treatment Notes Electronic Signature(s) Signed: 04/23/2020 5:55:20 PM By: Deon Pilling Signed:  04/24/2020 2:27:07 PM By: Linton Ham MD Entered By: Linton Ham on 04/23/2020 13:48:56 -------------------------------------------------------------------------------- Multi-Disciplinary Care Plan Details Patient Name: Date of Service: [name], [name]. 04/23/2020 1:30 PM Medical Record Number: [id number] Patient Account Number: [id number] Date of Birth/Sex: Treating RN: 1932/04/07 (86 y.o. [name] Primary Care Lizabeth Fellner: Velna Hatchet Other Clinician: Referring Staton Markey: Treating Allyn Bartelson/Extender: Wendall Mola in Treatment: 3 Active Inactive Abuse / Safety / Falls / Self Care Management Nursing Diagnoses: Potential for falls Goals: Patient/caregiver will verbalize/demonstrate measures taken to prevent injury and/or falls Date Initiated: 03/30/2020 Target Resolution Date: 05/17/2020 Goal Status: Active Interventions: Assess fall risk on admission and as needed Notes: Venous Leg Ulcer Nursing Diagnoses: Knowledge deficit related to disease process and management Potential for venous Insuffiency (use before diagnosis confirmed) Goals: Patient will maintain optimal edema control Date Initiated: 03/30/2020 Target Resolution Date: 05/10/2020 Goal Status: Active Interventions: Assess peripheral edema status every visit. Compression as ordered Provide education on venous insufficiency Treatment Activities: Therapeutic compression applied : 03/30/2020 Notes: Wound/Skin Impairment Nursing Diagnoses: Impaired tissue integrity Knowledge deficit related to ulceration/compromised skin integrity Goals: Patient/caregiver will verbalize understanding of skin care regimen Date Initiated: 03/30/2020 Date Inactivated: 04/23/2020 Target Resolution Date: 04/27/2020 Goal Status: Met Ulcer/skin breakdown will have a volume reduction of 30% by week 4 Date Initiated: 03/30/2020 Target Resolution Date: 05/03/2020 Goal Status:  Active Interventions: Assess patient/caregiver ability to obtain necessary supplies Assess patient/caregiver ability to perform ulcer/skin care regimen upon admission and as needed Assess ulceration(s) every visit Provide education on ulcer and skin care Treatment Activities: Skin care regimen initiated : 03/30/2020 Topical wound management initiated : 03/30/2020 Notes: Electronic Signature(s) Signed: 04/23/2020 5:55:20 PM By: Deon Pilling Entered By: Deon Pilling on 04/23/2020 13:43:45 -------------------------------------------------------------------------------- Pain Assessment Details Patient Name: Date of Service: Judith Boone 04/23/2020 1:30 PM Medical Record Number: [id number] Patient Account Number: [id number] Date of Birth/Sex: Treating RN: 08/19/1931 (86 y.o. [name] Primary Care Waylon Hershey: Velna Hatchet Other Clinician: Referring Fabiola Mudgett: Treating Jimie Kuwahara/Extender: Wendall Mola in Treatment: 3 Active Problems Location of Pain Severity and Description of Pain Patient Has Paino No Patient Has Paino No Site Locations Pain Management and Medication Current Pain Management: Electronic Signature(s) Signed: 04/23/2020 5:55:20 PM By: Deon Pilling Signed: 04/24/2020 12:40:32 PM By: Sandre Kitty Entered By: Sandre Kitty on 04/23/2020 13:30:21 -------------------------------------------------------------------------------- Patient/Caregiver Education Details Patient Name: Date of Service: Judith Boone 12/27/2021andnbsp1:30 PM Medical Record Number: [id number] Patient Account Number: [id number] Date of Birth/Gender: Treating RN: [birth year] (86 y.o. [name] Primary Care Physician: Velna Hatchet Other Clinician: Referring Physician: Treating Physician/Extender: Wendall Mola in Treatment: 3 Education Assessment Education Provided To: Patient Education Topics  Provided Venous: Handouts: Controlling Swelling with Multilayered  Compression Wraps Methods: Explain/Verbal Responses: Reinforcements needed Electronic Signature(s) Signed: 04/23/2020 5:55:20 PM By: Deon Pilling Entered By: Deon Pilling on 04/23/2020 13:43:59 -------------------------------------------------------------------------------- Wound Assessment Details Patient Name: Date of Service: Judith Boone 04/23/2020 1:30 PM Medical Record Number: [id number] Patient Account Number: [id number] Date of Birth/Sex: Treating RN: [birth year] (86 y.o. [name] Primary Care Shaquira Moroz: Velna Hatchet Other Clinician: Referring Tre Sanker: Treating Kenton Fortin/Extender: Wendall Mola in Treatment: 3 Wound Status Wound Number: 6 Primary Inflammatory Etiology: Wound Location: Left, Posterior Lower Leg Wound Open Wounding Event: Trauma Status: Date Acquired: 12/28/2019 Comorbid Cataracts, Lymphedema, Hypertension, Peripheral Arterial Weeks Of Treatment: 3 History: Disease, Osteoarthritis Clustered Wound: No Wound Measurements Length: (cm) 0.9 Width: (cm) 1.5 Depth: (cm) 0.2 Area: (cm) 1.06 Volume: (cm) 0.212 % Reduction in Area: -49.9% % Reduction in Volume: 50% Epithelialization: Small (1-33%) Tunneling: No Undermining: No Wound Description Classification: Full Thickness Without Exposed Support Structures Wound Margin: Distinct, outline attached Exudate Amount: Medium Exudate Type: Serosanguineous Exudate Color: red, brown Foul Odor After Cleansing: No Slough/Fibrino Yes Wound Bed Granulation Amount: Medium (34-66%) Exposed Structure Granulation Quality: Red Fascia Exposed: No Necrotic Amount: Medium (34-66%) Fat Layer (Subcutaneous Tissue) Exposed: Yes Necrotic Quality: Adherent Slough Tendon Exposed: No Muscle Exposed: No Joint Exposed: No Bone Exposed: No Treatment Notes Wound #6 (Lower Leg) Wound Laterality: Left,  Posterior Cleanser Peri-Wound Care Sween Lotion (Moisturizing lotion) Discharge Instruction: Apply moisturizing lotion as directed Topical Primary Dressing Iodosorb Gel 10 (gm) Tube Discharge Instruction: Apply to wound bed as instructed Secondary Dressing Woven Gauze Sponge, Non-Sterile 4x4 in Discharge Instruction: Apply over primary dressing as directed. Secured With Compression Wrap Kerlix Roll 4.5x3.1 (in/yd) Discharge Instruction: Apply Kerlix and Coban compression as directed. Coban Self-Adherent Wrap 4x5 (in/yd) Discharge Instruction: Apply over Kerlix as directed. Compression Stockings Add-Ons Electronic Signature(s) Signed: 04/23/2020 5:55:20 PM By: Deon Pilling Entered By: Deon Pilling on 04/23/2020 13:40:03 -------------------------------------------------------------------------------- Vitals Details Patient Name: Date of Service: [name], [name]. 04/23/2020 1:30 PM Medical Record Number: [id number] Patient Account Number: [id number] Date of Birth/Sex: Treating RN: July 10, 1931 (86 y.o. [name] Primary Care Mareesa Gathright: Velna Hatchet Other Clinician: Referring Jayleen Afonso: Treating Adriauna Campton/Extender: Wendall Mola in Treatment: 3 Vital Signs Time Taken: 13:30 Temperature (F): 98.2 Height (in): 60 Pulse (bpm): 80 Weight (lbs): 138 Respiratory Rate (breaths/min): 18 Body Mass Index (BMI): 26.9 Blood Pressure (mmHg): 132/76 Reference Range: 80 - 120 mg / dl Electronic Signature(s) Signed: 04/24/2020 12:40:32 PM By: Sandre Kitty Entered By: Sandre Kitty on 04/23/2020 13:30:14

## 2020-04-24 NOTE — Progress Notes (Signed)
DRAKE, LANDING ([id number]) Visit Report for 04/23/2020 Debridement Details Patient Name: Date of Service: Judith Boone 04/23/2020 1:30 PM Medical Record Number: [id number] Patient Account Number: [id number] Date of Birth/Sex: Treating RN: [birth year] (86 y.o. [name] Primary Care Provider: Velna Hatchet Other Clinician: Referring Provider: Treating Provider/Extender: Wendall Mola in Treatment: 3 Debridement Performed for Assessment: Wound #6 Left,Posterior Lower Leg Performed By: Physician Ricard Dillon., MD Debridement Type: Debridement Level of Consciousness (Pre-procedure): Awake and Alert Pre-procedure Verification/Time Out Yes - 13:39 Taken: Start Time: 13:40 Pain Control: Lidocaine 4% T opical Solution T Area Debrided (L x W): otal 0.9 (cm) x 1.5 (cm) = 1.35 (cm) Tissue and other material debrided: Viable, Non-Viable, Slough, Subcutaneous, Skin: Dermis , Fibrin/Exudate, Slough Level: Skin/Subcutaneous Tissue Debridement Description: Excisional Instrument: Curette Bleeding: Minimum Hemostasis Achieved: Pressure End Time: 13:42 Procedural Pain: 0 Post Procedural Pain: 0 Response to Treatment: Procedure was tolerated well Level of Consciousness (Post- Awake and Alert procedure): Post Debridement Measurements of Total Wound Length: (cm) 0.9 Width: (cm) 1.5 Depth: (cm) 0.2 Volume: (cm) 0.212 Character of Wound/Ulcer Post Debridement: Improved Post Procedure Diagnosis Same as Pre-procedure Electronic Signature(s) Signed: 04/23/2020 5:55:20 PM By: Deon Pilling Signed: 04/24/2020 2:27:07 PM By: Linton Ham MD Entered By: Linton Ham on 04/23/2020 13:49:09 -------------------------------------------------------------------------------- HPI Details Patient Name: Date of Service: Judith Boone. 04/23/2020 1:30 PM Medical Record Number: [id number] Patient Account Number: [id number] Date of Birth/Sex: Treating  RN: [birth year] (86 y.o. [name] Primary Care Provider: Velna Hatchet Other Clinician: Referring Provider: Treating Provider/Extender: Wendall Mola in Treatment: 3 History of Present Illness Location: left anterior shin to wounds about 2 months ago and on the left lateral calf 1 wound about 1 week ago Quality: Patient reports experiencing a dull pain to affected area(s). Severity: Patient states wound(s) are getting worse. Duration: Patient has had the wound for > 2 months prior to seeking treatment at the wound center Timing: Pain in wound is Intermittent (comes and goes Context: The wound occurred when the patient had a blunt object hit her anterior shin and she tore her skin on the left lateral calf while she was wearing her trousers Modifying Factors: Patient wound(s)/ulcer(s) are worsening due to :swelling and redness and discharge ssociated Signs and Symptoms: Patient reports having increase swelling. A HPI Description: 86 year old patient was seen at her PCPs office at [place] for her left shin injury about a month ago. was seen in the ER for redness and drainage. She was started on Keflex 4 times a day and was then seen in follow-up in the office. Past medical history for GERD, right sciatica, COPD, DVT left lower extremity in May 2016. Also known to have a left femoral artery occlusion with collaterals and was seen in May 2016 by [facility]. He is status post bilateral knee arthroplasty, appendectomy, TAH/BSO in 2000. The patient was seen again in follow-up on 12/12/2016 with difficulty to heal wound due to peripheral artery disease and venous disease and there started on doxycycline 100 mg twice a day for 10 days and referred her to the wound center for an opinion. the patient has had a right lower extremity venous Doppler ultrasound and no evidence of DVT was found in the right lower extremity in July 2018. in May 2016 she had a lower  extremity venous duplex evaluation done and there was deep venous reflux observed throughout the left lower extremity but there was no saphenous  reflux. There was also focal deep vein thrombosis in one of the paired posterior tibial veins in the left calf. lower arterial study done in September 2015 showed right ABI of 1.08 and the left ABI of 0.87 with right toe brachial index of 0.71 in left toe brachial index of 0.87. 12/31/2016 -- had a lower extremity venous duplex reflux evaluation done which shows no evidence of bilateral lower extremity DVT There was reflux in . bilateral common femoral veins and left popliteal vein. Reflux in the right saphenofemoral junction and proximal great saphenous vein and in the great saphenous vein at the medial knee and proximal calf. Reflux in the right small saphenous vein midsegment. Reflux in the left saphenofemoral junction only. No reflux observed in the great saphenous vein and small saphenous vein. lower extremity arterial study done showed a left ABI -- 0.76 suggestive of moderate arterial occlusive disease and the right ABI -- 0.93 was within normal limits. toe brachial indices were 0.58 on the right and 0.50 on the left which were both abnormal. 01/07/2017 -- her PCP manage to get her an appointment to see Dr. Donnetta Hutching, tomorrow. Dr. Ardeth Perfect also said she should not be on any Lasix 01/14/2017 -- was seen by Dr. Althea Charon -- who reviewed the data which showed she had reflux in the right leg in the common femoral vein and femoral vein odd and on the left side throughout the common femoral vein and femoral vein and popliteal vein. Arterial studies revealed normal ABIs on the right and a reduced ABI on the left with biphasic waveform. He did not find any correctable superficial reflux and she did have mild arterial insufficiency but felt that it was adequate flow for healing these venous stasis ulcers without revascularization attempts. If she had progressive  tissue loss she could undergo arteriography. He would see her back as needed 05/05/17 READMISSION This is a patient who is seen here earlier in the year for a wound on the left anterior lower leg. She had reflux studies and arterial studies done at that time and she saw Dr. early. Dr. Tawni Millers did not feel she had correctable superficial reflux. As far as arterial studies were concerned she had a left ABI of 0.76 a right ABI of 0.93 a TBI on the left at 0.50 and on the right at 0.5 date. She was felt to have mild arterial disease but blood flow was felt to be adequate for healing. The patient had a fall on November 30 causing wounds on the right lateral lower leg and the right mid humeral level. She is simply been covering these although recently she was put on cephalexin by her primary physician. She is here for review of these wounds. She has been compliant with the compression stockings that were ordered during her last admission here. These are 20-30 mm 05/12/17; we put Santyl on her right leg wound and wrapped her and the wound actually looks quite a bit healthier today. There is been very significant reduction in the skin tear in her right mid humerus. We xeroform The patient requests not to have her leg wrap this week, she has 20-30 mm compression stockings and would like me to prescribe Santyl so she can change the dressing even though I clearly explained it is likely we'll be changing to silver collagen or Hydrofera Blue next week 05/19/17; patient is changing her own dressing using Santyl on the right leg and Xeroform on the arm the remaining small areas on the arm  are looking epithelialized I don't think the Xeroform is necessary simply cover with a foam border. Still using Santyl to the right leg. No change in dimensions 05/26/17; the patient's wound on her arm has closed over. Changed her to [facility] last week from [place]. She still has a non viable surface today unfortunately 06/02/17; the  patient's right arm wound remains closed over. Changed her to [facility] last week after a vigorous debridement of the right anterior lower leg wound. Surface looks better 06/09/17; the patient's right arm remains closed. She is been on Prisma now for 2 weeks with hydrogel. 06/16/17; traumatic wound on the right anterior leg. I switched 3 weeks ago to [facility] from [place] although we've made no real progress. Still a gritty surface. I changed her to Iodoflex today. She is still using her on 20-30 mm stockings 06/23/17; traumatic wound on the right anterior leg in the setting of chronic venous insufficiency. Changed her to Iodoflex last week because of continued need for surface debridement. She arrives today with a better-looking wound surface. She is using her own compression stockings 06/30/17; traumatic wound on the right anterior leg in the setting of chronic venous insufficiency. Much better-looking wound surface. Will change to [facility] today. She is using her own compression stockings and the edema control looks adequate 07/07/17; traumatic wound on the right anterior leg in the setting of chronic venous insufficiency. Slightly smaller today using Hydrofera Blue 07/14/17-she is here for evaluation for right lower extremity ulcer. There is improvement in measurements and appearance. We will continue with same treatment plan and follow-up next week 07/23/17; right lower extremity ulcer. Continued improvement in measurements in appearance. Under the bright light there is still some surface slough although I elected not to debrided this today as the dimensions are significantly improved. We use Hydrofera Blue 08/04/17; right lower extremity traumatic ulcer in the setting of chronic venous insufficiency. Somewhat surprisingly she is healed today. T otally epithelialized. She has 20-30 mm compression stockings READMISSION 03/30/2020 Judith Boone is a now an 86 year old woman who was in this clinic in 2018 and  then for 3 months in 2019 at which time she had had a fall and had a wound on her right leg. She was discharged with 20/30 stockings. She tells me that her problem here began in September [place] rubbing the posterior left calf on the steps. She came home with a "large bruise". This eventually opened. She is on Eliquis. She was seen by her primary on 01/29/2020 at which time she had left leg swelling a DVT rule out was negative she was given doxycycline. There was no mention of an actual wound. At some point this area actually opened. She simply been applying a Band-Aid over it currently and presented with 4 small interconnecting wounds on the posterior calf however there was 0.6 cm in depth and undermining. Past medical history; chronic venous insufficiency, paroxysmal A. fib on Eliquis, COPD, lumbar spondylosis, hyperlipidemia, chronic renal failure stage III hypertension ABI in our clinic was 0.52. Her last noninvasive studies were in 2018. At that point on the left her ABI was 0.76 her TBI was 0.50. She had a extensive evaluation of her vascular status in September 2018 by Dr. Darene Lamer early. It was generally felt that she had enough blood flow to heal the predominantly venous odd ulcers on her left lower leg. He was also noted that she had very significant venous hypertension  related to deep venous reflux. At that point she did not have any correctable superficial reflux. Dr. Donnetta Hutching assessed her dorsalis pedis and posterior tibial with a hand-held Doppler and he felt that her flow was good 12/10; patient I admitted to the clinic last week. She had the remanence of a complex hematoma. Interconnecting wounds on the posterior left calf. I removed the connecting skin to fully expose the wound. 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 We have been using Iodoflex  she has edema in her legs she is very reluctant to continue the compression and really decided against that today. I am continuing the Iodoflex with border foam and using her own 20/30 mm stocking 12/27; some improvement in the wound surface. We have been using Iodoflex the patient did not want a wrap and therefore we have been using her own 20/30 stockings. She also has known PAD with a ABI in our clinic of 0.52. We may need to send her back to see vein and vascular [previously Dr. Early] if we can get this to close. Electronic Signature(s) Signed: 04/24/2020 2:27:07 PM By: Linton Ham MD Entered By: Linton Ham on 04/23/2020 13:50:36 -------------------------------------------------------------------------------- Physical Exam Details Patient Name: Date of Service: Judith Boone. 04/23/2020 1:30 PM Medical Record Number: [id number] Patient Account Number: [id number] Date of Birth/Sex: Treating RN: 02-09-1932 (86 y.o. [name] Primary Care Provider: Velna Hatchet Other Clinician: Referring Provider: Treating Provider/Extender: Wendall Mola in Treatment: 3 Constitutional Sitting or standing Blood Pressure is within target range for patient.. Pulse regular and within target range for patient.Marland Kitchen Respirations regular, non-labored and within target range.. Temperature is normal and within the target range for the patient.Marland Kitchen Appears in no distress. Cardiovascular Very poor edema control. Notes ThreeWound exam; left posterior mid calf wrap to remove surface necrotic debris. Subcutaneous tissue removed hemostasis with direct pressure. Although this is come down somewhat in terms of the length, we have not had any improvement in depth and the surface of the granulation is not proceeding as I would like. Electronic Signature(s) Signed: 04/24/2020 2:27:07 PM By: Linton Ham MD Entered By: Linton Ham on 04/23/2020  13:52:07 -------------------------------------------------------------------------------- Physician Orders Details Patient Name: Date of Service: Judith Boone. 04/23/2020 1:30 PM Medical Record Number: [id number] Patient Account Number: [id number] Date of Birth/Sex: Treating RN: 07-Jul-1931 (86 y.o. [name] Primary Care Provider: Velna Hatchet Other Clinician: Referring Provider: Treating Provider/Extender: Wendall Mola in Treatment: 3 Verbal / Phone Orders: No Diagnosis Coding ICD-10 Coding Code Description S80.12XD Contusion of left lower leg, subsequent encounter L97.222 Non-pressure chronic ulcer of left calf with fat layer exposed I70.242 Atherosclerosis of native arteries of left leg with ulceration of calf Follow-up Appointments Return Appointment in 1 week. Bathing/ Shower/ Hygiene May shower with protection but do not get wound dressing(s) wet. Edema Control - Lymphedema / SCD / Other Bilateral Lower Extremities Elevate legs to the level of the heart or above for 30 minutes daily and/or when sitting, a frequency of: - throughout the day Avoid standing for long periods of time. Exercise regularly Moisturize legs daily. - right leg. Compression stocking or Garment 20-30 mm/Hg pressure to: - right leg only. apply in the morning and remove at night Wound Treatment Wound #6 - Lower Leg Wound Laterality: Left, Posterior Peri-Wound Care: Sween Lotion (Moisturizing lotion) 1 x Per Week Discharge Instructions: Apply moisturizing  lotion as directed Prim Dressing: Iodosorb Gel 10 (gm) Tube (Generic) 1 x Per Week ary Discharge Instructions: Apply to wound bed as instructed Secondary Dressing: Woven Gauze Sponge, Non-Sterile 4x4 in 1 x Per Week Discharge Instructions: Apply over primary dressing as directed. Compression Wrap: Kerlix Roll 4.5x3.1 (in/yd) 1 x Per Week Discharge Instructions: Apply Kerlix and Coban compression as  directed. Compression Wrap: Coban Self-Adherent Wrap 4x5 (in/yd) 1 x Per Week Discharge Instructions: Apply over Kerlix as directed. Electronic Signature(s) Signed: 04/23/2020 5:55:20 PM By: Deon Pilling Signed: 04/24/2020 2:27:07 PM By: Linton Ham MD Entered By: Deon Pilling on 04/23/2020 13:46:22 -------------------------------------------------------------------------------- Problem List Details Patient Name: Date of Service: Judith Boone. 04/23/2020 1:30 PM Medical Record Number: [id number] Patient Account Number: [id number] Date of Birth/Sex: Treating RN: 04/04/1932 (86 y.o. [name] Primary Care Provider: Velna Hatchet Other Clinician: Referring Provider: Treating Provider/Extender: Wendall Mola in Treatment: 3 Active Problems ICD-10 Encounter Code Description Active Date MDM Diagnosis S80.12XD Contusion of left lower leg, subsequent encounter 03/30/2020 No Yes L97.222 Non-pressure chronic ulcer of left calf with fat layer exposed 03/30/2020 No Yes I70.242 Atherosclerosis of native arteries of left leg with ulceration of calf 03/30/2020 No Yes Inactive Problems Resolved Problems Electronic Signature(s) Signed: 04/24/2020 2:27:07 PM By: Linton Ham MD Entered By: Linton Ham on 04/23/2020 13:48:48 -------------------------------------------------------------------------------- Progress Note Details Patient Name: Date of Service: Judith Boone. 04/23/2020 1:30 PM Medical Record Number: [id number] Patient Account Number: [id number] Date of Birth/Sex: Treating RN: Jan 25, 1932 (86 y.o. [name] Primary Care Provider: Velna Hatchet Other Clinician: Referring Provider: Treating Provider/Extender: Wendall Mola in Treatment: 3 Subjective History of Present Illness (HPI) The following HPI elements were documented for the patient's wound: Location: left anterior shin to wounds about 2 months  ago and on the left lateral calf 1 wound about 1 week ago Quality: Patient reports experiencing a dull pain to affected area(s). Severity: Patient states wound(s) are getting worse. Duration: Patient has had the wound for > 2 months prior to seeking treatment at the wound center Timing: Pain in wound is Intermittent (comes and goes Context: The wound occurred when the patient had a blunt object hit her anterior shin and she tore her skin on the left lateral calf while she was wearing her trousers Modifying Factors: Patient wound(s)/ulcer(s) are worsening due to :swelling and redness and discharge Associated Signs and Symptoms: Patient reports having increase swelling. 86 year old patient was seen at her PCPs office at [place] for her left shin injury about a month ago. was seen in the ER for redness and drainage. She was started on Keflex 4 times a day and was then seen in follow-up in the office. Past medical history for GERD, right sciatica, COPD, DVT left lower extremity in May 2016. Also known to have a left femoral artery occlusion with collaterals and was seen in May 2016 by [facility]. He is status post bilateral knee arthroplasty, appendectomy, TAH/BSO in 2000. The patient was seen again in follow-up on 12/12/2016 with difficulty to heal wound due to peripheral artery disease and venous disease and there started on doxycycline 100 mg twice a day for 10 days and referred her to the wound center for an opinion. the patient has had a right lower extremity venous Doppler ultrasound and no evidence of DVT was found in the right lower extremity in July 2018. in May 2016 she had a lower extremity venous duplex evaluation done and  there was deep venous reflux observed throughout the left lower extremity but there was no saphenous reflux. There was also focal deep vein thrombosis in one of the paired posterior tibial veins in the left calf. lower arterial study done in September 2015  showed right ABI of 1.08 and the left ABI of 0.87 with right toe brachial index of 0.71 in left toe brachial index of 0.87. 12/31/2016 -- had a lower extremity venous duplex reflux evaluation done which shows no evidence of bilateral lower extremity DVT There was reflux in . bilateral common femoral veins and left popliteal vein. Reflux in the right saphenofemoral junction and proximal great saphenous vein and in the great saphenous vein at the medial knee and proximal calf. Reflux in the right small saphenous vein midsegment. Reflux in the left saphenofemoral junction only. No reflux observed in the great saphenous vein and small saphenous vein. lower extremity arterial study done showed a left ABI -- 0.76 suggestive of moderate arterial occlusive disease and the right ABI -- 0.93 was within normal limits. toe brachial indices were 0.58 on the right and 0.50 on the left which were both abnormal. 01/07/2017 -- her PCP manage to get her an appointment to see Dr. Donnetta Hutching, tomorrow. Dr. Ardeth Perfect also said she should not be on any Lasix 01/14/2017 -- was seen by Dr. Althea Charon -- who reviewed the data which showed she had reflux in the right leg in the common femoral vein and femoral vein odd and on the left side throughout the common femoral vein and femoral vein and popliteal vein. Arterial studies revealed normal ABIs on the right and a reduced ABI on the left with biphasic waveform. He did not find any correctable superficial reflux and she did have mild arterial insufficiency but felt that it was adequate flow for healing these venous stasis ulcers without revascularization attempts. If she had progressive tissue loss she could undergo arteriography. He would see her back as needed 05/05/17 READMISSION This is a patient who is seen here earlier in the year for a wound on the left anterior lower leg. She had reflux studies and arterial studies done at that time and she saw Dr. early. Dr. Tawni Millers did not  feel she had correctable superficial reflux. As far as arterial studies were concerned she had a left ABI of 0.76 a right ABI of 0.93 a TBI on the left at 0.50 and on the right at 0.5 date. She was felt to have mild arterial disease but blood flow was felt to be adequate for healing. The patient had a fall on November 30 causing wounds on the right lateral lower leg and the right mid humeral level. She is simply been covering these although recently she was put on cephalexin by her primary physician. She is here for review of these wounds. She has been compliant with the compression stockings that were ordered during her last admission here. These are 20-30 mm 05/12/17; we put Santyl on her right leg wound and wrapped her and the wound actually looks quite a bit healthier today. There is been very significant reduction in the skin tear in her right mid humerus. We xeroform The patient requests not to have her leg wrap this week, she has 20-30 mm compression stockings and would like me to prescribe Santyl so she can change the dressing even though I clearly explained it is likely we'll be changing to silver collagen or Hydrofera Blue next week 05/19/17; patient is changing her own dressing using  Santyl on the right leg and Xeroform on the arm the remaining small areas on the arm are looking epithelialized I don't think the Xeroform is necessary simply cover with a foam border. Still using Santyl to the right leg. No change in dimensions 05/26/17; the patient's wound on her arm has closed over. Changed her to [facility] last week from [place]. She still has a non viable surface today unfortunately 06/02/17; the patient's right arm wound remains closed over. Changed her to [facility] last week after a vigorous debridement of the right anterior lower leg wound. Surface looks better 06/09/17; the patient's right arm remains closed. She is been on Prisma now for 2 weeks with hydrogel. 06/16/17; traumatic wound on the right  anterior leg. I switched 3 weeks ago to [facility] from [place] although we've made no real progress. Still a gritty surface. I changed her to Iodoflex today. She is still using her on 20-30 mm stockings 06/23/17; traumatic wound on the right anterior leg in the setting of chronic venous insufficiency. Changed her to Iodoflex last week because of continued need for surface debridement. She arrives today with a better-looking wound surface. She is using her own compression stockings 06/30/17; traumatic wound on the right anterior leg in the setting of chronic venous insufficiency. Much better-looking wound surface. Will change to [facility] today. She is using her own compression stockings and the edema control looks adequate 07/07/17; traumatic wound on the right anterior leg in the setting of chronic venous insufficiency. Slightly smaller today using Hydrofera Blue 07/14/17-she is here for evaluation for right lower extremity ulcer. There is improvement in measurements and appearance. We will continue with same treatment plan and follow-up next week 07/23/17; right lower extremity ulcer. Continued improvement in measurements in appearance. Under the bright light there is still some surface slough although I elected not to debrided this today as the dimensions are significantly improved. We use Hydrofera Blue 08/04/17; right lower extremity traumatic ulcer in the setting of chronic venous insufficiency. Somewhat surprisingly she is healed today. Totally epithelialized. She has 20-30 mm compression stockings READMISSION 03/30/2020 Judith Boone is a now an 86 year old woman who was in this clinic in 2018 and then for 3 months in 2019 at which time she had had a fall and had a wound on her right leg. She was discharged with 20/30 stockings. She tells me that her problem here began in September she [place] and apparently rubbing the posterior left calf on the steps. She came  home with a "large bruise". This eventually opened. She is on Eliquis. She was seen by her primary on 01/29/2020 at which time she had left leg swelling a DVT rule out was negative she was given doxycycline. There was no mention of an actual wound. At some point this area actually opened. She simply been applying a Band-Aid over it currently and presented with 4 small interconnecting wounds on the posterior calf however there was 0.6 cm in depth and undermining. Past medical history; chronic venous insufficiency, paroxysmal A. fib on Eliquis, COPD, lumbar spondylosis, hyperlipidemia, chronic renal failure stage III hypertension ABI in our clinic was 0.52. Her last noninvasive studies were in 2018. At that point on the left her ABI was 0.76 her TBI was 0.50. She had a extensive evaluation of her vascular status in September 2018 by Dr. Darene Lamer early. It was generally felt that she had enough blood flow to heal the predominantly venous odd ulcers  on her left lower leg. He was also noted that she had very significant venous hypertension related to deep venous reflux. At that point she did not have any correctable superficial reflux. Dr. Donnetta Hutching assessed her dorsalis pedis and posterior tibial with a hand-held Doppler and he felt that her flow was good 12/10; patient I admitted to the clinic last week. She had the remanence of a complex hematoma. Interconnecting wounds on the posterior left calf. I removed the connecting skin to fully expose the wound. 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 We have been using Iodoflex she has edema in her legs she is very reluctant to continue the compression and really decided against that today. I am continuing the Iodoflex with border foam and using her own 20/30 mm stocking 12/27; some improvement in the wound surface. We have been using Iodoflex the patient did not want a wrap and  therefore we have been using her own 20/30 stockings. She also has known PAD with a ABI in our clinic of 0.52. We may need to send her back to see vein and vascular [previously Dr. Early] if we can get this to close. Objective Constitutional Sitting or standing Blood Pressure is within target range for patient.. Pulse regular and within target range for patient.Marland Kitchen Respirations regular, non-labored and within target range.. Temperature is normal and within the target range for the patient.Marland Kitchen Appears in no distress. Vitals Time Taken: 1:30 PM, Height: 60 in, Weight: 138 lbs, BMI: 26.9, Temperature: 98.2 F, Pulse: 80 bpm, Respiratory Rate: 18 breaths/min, Blood Pressure: 132/76 mmHg. Cardiovascular Very poor edema control. General Notes: ThreeWound exam; left posterior mid calf wrap to remove surface necrotic debris. Subcutaneous tissue removed hemostasis with direct pressure. Although this is come down somewhat in terms of the length, we have not had any improvement in depth and the surface of the granulation is not proceeding as I would like. Integumentary (Hair, Skin) Wound #6 status is Open. Original cause of wound was Trauma. The wound is located on the Left,Posterior Lower Leg. The wound measures 0.9cm length x 1.5cm width x 0.2cm depth; 1.06cm^2 area and 0.212cm^3 volume. There is Fat Layer (Subcutaneous Tissue) exposed. There is no tunneling or undermining noted. There is a medium amount of serosanguineous drainage noted. The wound margin is distinct with the outline attached to the wound base. There is medium (34-66%) red granulation within the wound bed. There is a medium (34-66%) amount of necrotic tissue within the wound bed including Adherent Slough. 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 There was a Excisional Skin/Subcutaneous Tissue Debridement with a total area of 1.35 sq cm performed by Ricard Dillon., MD. With the following instrument(s): Curette to remove Viable and Non-Viable tissue/material. Material removed includes Subcutaneous Tissue, Slough, Skin: Dermis, and Fibrin/Exudate after achieving pain control using Lidocaine 4% T opical Solution. A time out was conducted at 13:39, prior to the start of the procedure. A Minimum amount of bleeding was controlled with Pressure. The procedure was tolerated well with a pain level of 0 throughout and a pain level of 0 following the procedure. Post Debridement Measurements: 0.9cm length x 1.5cm width x 0.2cm  depth; 0.212cm^3 volume. Character of Wound/Ulcer Post Debridement is improved. Post procedure Diagnosis Wound #6: Same as Pre-Procedure Plan Follow-up Appointments: Return Appointment in 1 week. Bathing/ Shower/ Hygiene: May shower with protection but do not get wound dressing(s) wet. Edema Control - Lymphedema / SCD / Other: Elevate legs to the level of the heart or above for 30 minutes daily and/or when sitting, a frequency of: - throughout the day Avoid standing for long periods of time. Exercise regularly Moisturize legs daily. - right leg. Compression stocking or Garment 20-30 mm/Hg pressure to: - right leg only. apply in the morning and remove at night WOUND #6: - Lower Leg Wound Laterality: Left, Posterior Peri-Wound Care: Sween Lotion (Moisturizing lotion) 1 x Per Week/ Discharge Instructions: Apply moisturizing lotion as directed Prim Dressing: Iodosorb Gel 10 (gm) Tube (Generic) 1 x Per Week/ ary Discharge Instructions: Apply to wound bed as instructed Secondary Dressing: Woven Gauze Sponge, Non-Sterile 4x4 in 1 x Per Week/ Discharge Instructions: Apply over primary dressing as directed. Com pression Wrap:  Kerlix Roll 4.5x3.1 (in/yd) 1 x Per Week/ Discharge Instructions: Apply Kerlix and Coban compression as directed. Com pression Wrap: Coban Self-Adherent Wrap 4x5 (in/yd) 1 x Per Week/ Discharge Instructions: Apply over Kerlix as directed. 1. I changed her to silver collagen 2. With her permission put her in a kerlix Coban wrap 3. We may need to get her seen by vascular with regards to her arterial insufficiency if we cannot get this to close. 4 Clearly with her 20/30 below-knee stockings we do not have adequate edema control Electronic Signature(s) Signed: 04/24/2020 2:27:07 PM By: Linton Ham MD Entered By: Linton Ham on 04/23/2020 13:53:19 -------------------------------------------------------------------------------- SuperBill Details Patient Name: Date of Service: Judith Boone 04/23/2020 Medical Record Number: [id number] Patient Account Number: [id number] Date of Birth/Sex: Treating RN: Jan 16, 1932 (86 y.o. 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

## 2020-04-28 DIAGNOSIS — Z5189 Encounter for other specified aftercare: Secondary | ICD-10-CM

## 2020-04-28 HISTORY — DX: Encounter for other specified aftercare: Z51.89

## 2020-05-04 ENCOUNTER — Other Ambulatory Visit: Payer: Self-pay

## 2020-05-04 ENCOUNTER — Encounter (HOSPITAL_BASED_OUTPATIENT_CLINIC_OR_DEPARTMENT_OTHER): Payer: Medicare Other | Attending: Internal Medicine | Admitting: Internal Medicine

## 2020-05-04 DIAGNOSIS — I872 Venous insufficiency (chronic) (peripheral): Secondary | ICD-10-CM | POA: Diagnosis not present

## 2020-05-04 DIAGNOSIS — I70242 Atherosclerosis of native arteries of left leg with ulceration of calf: Secondary | ICD-10-CM | POA: Insufficient documentation

## 2020-05-04 DIAGNOSIS — N183 Chronic kidney disease, stage 3 unspecified: Secondary | ICD-10-CM | POA: Diagnosis not present

## 2020-05-04 DIAGNOSIS — I129 Hypertensive chronic kidney disease with stage 1 through stage 4 chronic kidney disease, or unspecified chronic kidney disease: Secondary | ICD-10-CM | POA: Insufficient documentation

## 2020-05-04 DIAGNOSIS — L97822 Non-pressure chronic ulcer of other part of left lower leg with fat layer exposed: Secondary | ICD-10-CM | POA: Diagnosis not present

## 2020-05-04 DIAGNOSIS — S8012XA Contusion of left lower leg, initial encounter: Secondary | ICD-10-CM | POA: Diagnosis not present

## 2020-05-04 DIAGNOSIS — J449 Chronic obstructive pulmonary disease, unspecified: Secondary | ICD-10-CM | POA: Diagnosis not present

## 2020-05-04 DIAGNOSIS — I890 Lymphedema, not elsewhere classified: Secondary | ICD-10-CM | POA: Insufficient documentation

## 2020-05-04 DIAGNOSIS — L97222 Non-pressure chronic ulcer of left calf with fat layer exposed: Secondary | ICD-10-CM | POA: Diagnosis not present

## 2020-05-04 DIAGNOSIS — Z86718 Personal history of other venous thrombosis and embolism: Secondary | ICD-10-CM | POA: Insufficient documentation

## 2020-05-04 DIAGNOSIS — W228XXA Striking against or struck by other objects, initial encounter: Secondary | ICD-10-CM | POA: Insufficient documentation

## 2020-05-04 DIAGNOSIS — Z96653 Presence of artificial knee joint, bilateral: Secondary | ICD-10-CM | POA: Insufficient documentation

## 2020-05-04 DIAGNOSIS — I480 Paroxysmal atrial fibrillation: Secondary | ICD-10-CM | POA: Insufficient documentation

## 2020-05-04 DIAGNOSIS — Z7901 Long term (current) use of anticoagulants: Secondary | ICD-10-CM | POA: Diagnosis not present

## 2020-05-04 NOTE — Progress Notes (Signed)
JALA, DUNDON ([id number]) Visit Report for 05/04/2020 Arrival Information Details Patient Name: Date of Service: Judith Boone 05/04/2020 3:00 PM Medical Record Number: [id number] Patient Account Number: [id number] Date of Birth/Sex: Treating RN: 30-Jan-1932 (87 y.o. [name] Primary Care Orlen Leedy: Velna Hatchet Other Clinician: Referring Loney Peto: Treating Tyquasia Pant/Extender: Wendall Mola in Treatment: 5 Visit Information History Since Last Visit Added or deleted any medications: No Patient Arrived: Gilford Rile Any new allergies or adverse reactions: No Arrival Time: 14:44 Had a fall or experienced change in No Accompanied By: self activities of daily living that may affect Transfer Assistance: None risk of falls: Patient Identification Verified: Yes Signs or symptoms of abuse/neglect since last visito No Secondary Verification Process Completed: Yes Hospitalized since last visit: No Patient Requires Transmission-Based Precautions: No Implantable device outside of the clinic excluding No Patient Has Alerts: No cellular tissue based products placed in the center since last visit: Has Dressing in Place as Prescribed: Yes Pain Present Now: No Electronic Signature(s) Signed: 05/04/2020 4:05:28 PM By: Rhae Hammock RN Entered By: Rhae Hammock on 05/04/2020 14:45:19 -------------------------------------------------------------------------------- Clinic Level of Care Assessment Details Patient Name: Date of Service: Judith Boone 05/04/2020 3:00 PM Medical Record Number: [id number] Patient Account Number: [id number] Date of Birth/Sex: Treating RN: Dec 24, 1931 (87 y.o. [name] Primary Care Robley Matassa: Velna Hatchet Other Clinician: Referring Adriannah Steinkamp: Treating Lizzeth Meder/Extender: Wendall Mola in Treatment: 5 Clinic Level of Care Assessment Items TOOL 4 Quantity Score '[]'  - 0 Use when only an EandM is  performed on FOLLOW-UP visit ASSESSMENTS - Nursing Assessment / Reassessment X- 1 10 Reassessment of Co-morbidities (includes updates in patient status) X- 1 5 Reassessment of Adherence to Treatment Plan ASSESSMENTS - Wound and Skin A ssessment / Reassessment X - Simple Wound Assessment / Reassessment - one wound 1 5 '[]'  - 0 Complex Wound Assessment / Reassessment - multiple wounds '[]'  - 0 Dermatologic / Skin Assessment (not related to wound area) ASSESSMENTS - Focused Assessment X- 1 5 Circumferential Edema Measurements - multi extremities '[]'  - 0 Nutritional Assessment / Counseling / Intervention X- 1 5 Lower Extremity Assessment (monofilament, tuning fork, pulses) '[]'  - 0 Peripheral Arterial Disease Assessment (using hand held doppler) ASSESSMENTS - Ostomy and/or Continence Assessment and Care '[]'  - 0 Incontinence Assessment and Management '[]'  - 0 Ostomy Care Assessment and Management (repouching, etc.) PROCESS - Coordination of Care X - Simple Patient / Family Education for ongoing care 1 15 '[]'  - 0 Complex (extensive) Patient / Family Education for ongoing care X- 1 10 Staff obtains Programmer, systems, Records, T Results / Process Orders est '[]'  - 0 Staff telephones HHA, Nursing Homes / Clarify orders / etc '[]'  - 0 Routine Transfer to another Facility (non-emergent condition) '[]'  - 0 Routine Hospital Admission (non-emergent condition) '[]'  - 0 New Admissions / Biomedical engineer / Ordering NPWT Apligraf, etc. , '[]'  - 0 Emergency Hospital Admission (emergent condition) X- 1 10 Simple Discharge Coordination '[]'  - 0 Complex (extensive) Discharge Coordination PROCESS - Special Needs '[]'  - 0 Pediatric / Minor Patient Management '[]'  - 0 Isolation Patient Management '[]'  - 0 Hearing / Language / Visual special needs '[]'  - 0 Assessment of Community assistance (transportation, D/C planning, etc.) '[]'  - 0 Additional assistance / Altered mentation '[]'  - 0 Support Surface(s) Assessment  (bed, cushion, seat, etc.) INTERVENTIONS - Wound Cleansing / Measurement X - Simple Wound Cleansing - one wound 1 5 '[]'  - 0 Complex Wound Cleansing - multiple  wounds X- 1 5 Wound Imaging (photographs - any number of wounds) '[]'  - 0 Wound Tracing (instead of photographs) X- 1 5 Simple Wound Measurement - one wound '[]'  - 0 Complex Wound Measurement - multiple wounds INTERVENTIONS - Wound Dressings X - Small Wound Dressing one or multiple wounds 1 10 '[]'  - 0 Medium Wound Dressing one or multiple wounds '[]'  - 0 Large Wound Dressing one or multiple wounds X- 1 5 Application of Medications - topical '[]'  - 0 Application of Medications - injection INTERVENTIONS - Miscellaneous '[]'  - 0 External ear exam '[]'  - 0 Specimen Collection (cultures, biopsies, blood, body fluids, etc.) '[]'  - 0 Specimen(s) / Culture(s) sent or taken to Lab for analysis '[]'  - 0 Patient Transfer (multiple staff / Civil Service fast streamer / Similar devices) '[]'  - 0 Simple Staple / Suture removal (25 or less) '[]'  - 0 Complex Staple / Suture removal (26 or more) '[]'  - 0 Hypo / Hyperglycemic Management (close monitor of Blood Glucose) '[]'  - 0 Ankle / Brachial Index (ABI) - do not check if billed separately X- 1 5 Vital Signs Has the patient been seen at the hospital within the last three years: Yes Total Score: 100 Level Of Care: New/Established - Level 3 Electronic Signature(s) Signed: 05/04/2020 4:46:42 PM By: Baruch Gouty RN, BSN Entered By: Baruch Gouty on 05/04/2020 15:18:09 -------------------------------------------------------------------------------- Encounter Discharge Information Details Patient Name: Date of Service: Judith Boone. 05/04/2020 3:00 PM Medical Record Number: [id number] Patient Account Number: [id number] Date of Birth/Sex: Treating RN: Aug 15, 1931 (88 y.o. [name] Primary Care Gopal Malter: Velna Hatchet Other Clinician: Referring Kadie Balestrieri: Treating Marquay Kruse/Extender: Wendall Mola in Treatment: 5 Encounter Discharge Information Items Discharge Condition: Stable Ambulatory Status: Walker Discharge Destination: Home Transportation: Private Auto Accompanied By: self Schedule Follow-up Appointment: Yes Clinical Summary of Care: Electronic Signature(s) Signed: 05/04/2020 4:33:40 PM By: Deon Pilling Entered By: Deon Pilling on 05/04/2020 15:58:18 -------------------------------------------------------------------------------- Lower Extremity Assessment Details Patient Name: Date of Service: Judith Boone 05/04/2020 3:00 PM Medical Record Number: [id number] Patient Account Number: [id number] Date of Birth/Sex: Treating RN: Sep 09, 1931 (87 y.o. [name] Primary Care Boluwatife Mutchler: Velna Hatchet Other Clinician: Referring Rodriques Badie: Treating Munachimso Rigdon/Extender: Wendall Mola in Treatment: 5 Edema Assessment Assessed: [Left: Yes] [Right: No] Edema: [Left: Ye] [Right: s] Calf Left: Right: Point of Measurement: 36 cm From Medial Instep 30.5 cm Ankle Left: Right: Point of Measurement: 10 cm From Medial Instep 21.5 cm Vascular Assessment Pulses: Dorsalis Pedis Palpable: [Left:Yes] Posterior Tibial Palpable: [Left:Yes] Electronic Signature(s) Signed: 05/04/2020 4:05:28 PM By: Rhae Hammock RN Entered By: Rhae Hammock on 05/04/2020 14:51:52 -------------------------------------------------------------------------------- Multi Wound Chart Details Patient Name: Date of Service: Judith Boone. 05/04/2020 3:00 PM Medical Record Number: [id number] Patient Account Number: [id number] Date of Birth/Sex: Treating RN: 10/05/1931 (87 y.o. [name] Primary Care Jolynn Bajorek: Velna Hatchet Other Clinician: Referring Raffi Milstein: Treating Iniya Matzek/Extender: Wendall Mola in Treatment: 5 Vital Signs Height(in): 60 Pulse(bpm): 99 Weight(lbs): 138 Blood Pressure(mmHg): 135/74 Body Mass  Index(BMI): 27 Temperature(F): 97.8 Respiratory Rate(breaths/min): 18 Photos: [6:No Photos Left, Posterior Lower Leg] [N/A:N/A N/A] Wound Location: [6:Trauma] [N/A:N/A] Wounding Event: [6:Inflammatory] [N/A:N/A] Primary Etiology: [6:Cataracts, Lymphedema,] [N/A:N/A] Comorbid History: [6:Hypertension, Peripheral Arterial Disease, Osteoarthritis 12/28/2019] [N/A:N/A] Date Acquired: [6:5] [N/A:N/A] Weeks of Treatment: [6:Open] [N/A:N/A] Wound Status: [6:0.7x1.1x0.1] [N/A:N/A] Measurements L x W x D (cm) [6:0.605] [N/A:N/A] A (cm) : rea [6:0.06] [N/A:N/A] Volume (cm) : [6:14.40%] [N/A:N/A] % Reduction in Area: [6:85.80%] [N/A:N/A] %  Reduction in Volume: [6:Full Thickness Without Exposed] [N/A:N/A] Classification: [6:Support Structures Medium] [N/A:N/A] Exudate Amount: [6:Serosanguineous] [N/A:N/A] Exudate Type: [6:red, brown] [N/A:N/A] Exudate Color: [6:Distinct, outline attached] [N/A:N/A] Wound Margin: [6:Large (67-100%)] [N/A:N/A] Granulation Amount: [6:Red] [N/A:N/A] Granulation Quality: [6:Small (1-33%)] [N/A:N/A] Necrotic Amount: [6:Fat Layer (Subcutaneous Tissue): Yes N/A] Exposed Structures: [6:Fascia: No Tendon: No Muscle: No Joint: No Bone: No Small (1-33%)] [N/A:N/A] Treatment Notes Electronic Signature(s) Signed: 05/04/2020 4:46:42 PM By: Baruch Gouty RN, BSN Signed: 05/04/2020 4:48:18 PM By: Linton Ham MD Entered By: Linton Ham on 05/04/2020 15:22:03 -------------------------------------------------------------------------------- Multi-Disciplinary Care Plan Details Patient Name: Date of Service: Judith Boone, Judith Forts. 05/04/2020 3:00 PM Medical Record Number: [id number] Patient Account Number: [id number] Date of Birth/Sex: Treating RN: February 24, 1932 (87 y.o. [name] Primary Care Natalina Wieting: Velna Hatchet Other Clinician: Referring Lakiya Cottam: Treating Shemica Meath/Extender: Wendall Mola in Treatment: 5 Active Inactive Abuse /  Safety / Falls / Self Care Management Nursing Diagnoses: Potential for falls Goals: Patient/caregiver will verbalize/demonstrate measures taken to prevent injury and/or falls Date Initiated: 03/30/2020 Target Resolution Date: 05/17/2020 Goal Status: Active Interventions: Assess fall risk on admission and as needed Notes: Venous Leg Ulcer Nursing Diagnoses: Knowledge deficit related to disease process and management Potential for venous Insuffiency (use before diagnosis confirmed) Goals: Patient will maintain optimal edema control Date Initiated: 03/30/2020 Target Resolution Date: 05/10/2020 Goal Status: Active Interventions: Assess peripheral edema status every visit. Compression as ordered Provide education on venous insufficiency Treatment Activities: Therapeutic compression applied : 03/30/2020 Notes: Wound/Skin Impairment Nursing Diagnoses: Impaired tissue integrity Knowledge deficit related to ulceration/compromised skin integrity Goals: Patient/caregiver will verbalize understanding of skin care regimen Date Initiated: 03/30/2020 Date Inactivated: 04/23/2020 Target Resolution Date: 04/27/2020 Goal Status: Met Ulcer/skin breakdown will have a volume reduction of 30% by week 4 Date Initiated: 03/30/2020 Date Inactivated: 05/04/2020 Target Resolution Date: 05/03/2020 Goal Status: Met Ulcer/skin breakdown will have a volume reduction of 50% by week 8 Date Initiated: 05/04/2020 Target Resolution Date: 06/01/2020 Goal Status: Active Interventions: Assess patient/caregiver ability to obtain necessary supplies Assess patient/caregiver ability to perform ulcer/skin care regimen upon admission and as needed Assess ulceration(s) every visit Provide education on ulcer and skin care Treatment Activities: Skin care regimen initiated : 03/30/2020 Topical wound management initiated : 03/30/2020 Notes: Electronic Signature(s) Signed: 05/04/2020 4:46:42 PM By: Baruch Gouty RN,  BSN Entered By: Baruch Gouty on 05/04/2020 15:17:20 -------------------------------------------------------------------------------- Pain Assessment Details Patient Name: Date of Service: Judith Boone. 05/04/2020 3:00 PM Medical Record Number: [id number] Patient Account Number: [id number] Date of Birth/Sex: Treating RN: April 08, 1932 (87 y.o. [name] Primary Care Kayla Weekes: Velna Hatchet Other Clinician: Referring Artemisa Sladek: Treating Vora Clover/Extender: Wendall Mola in Treatment: 5 Active Problems Location of Pain Severity and Description of Pain Patient Has Paino No Site Locations Rate the pain. Current Pain Level: 0 Pain Management and Medication Current Pain Management: Electronic Signature(s) Signed: 05/04/2020 4:05:28 PM By: Rhae Hammock RN Entered By: Rhae Hammock on 05/04/2020 14:45:29 -------------------------------------------------------------------------------- Patient/Caregiver Education Details Patient Name: Date of Service: Judith Boone 1/7/2022andnbsp3:00 PM Medical Record Number: [id number] Patient Account Number: [id number] Date of Birth/Gender: Treating RN: 03-29-1932 (87 y.o. [name] Primary Care Physician: Velna Hatchet Other Clinician: Referring Physician: Treating Physician/Extender: Wendall Mola in Treatment: 5 Education Assessment Education Provided To: Patient Education Topics Provided Venous: Methods: Explain/Verbal Responses: Reinforcements needed, State content correctly Wound/Skin Impairment: Methods: Explain/Verbal Responses: Reinforcements needed, State content correctly Electronic Signature(s) Signed: 05/04/2020 4:46:42 PM By: Baruch Gouty  RN, BSN Entered By: Baruch Gouty on 05/04/2020 15:17:37 -------------------------------------------------------------------------------- Wound Assessment Details Patient Name: Date of Service: Judith Boone 05/04/2020 3:00 PM Medical Record Number: [id number] Patient Account Number: [id number] Date of Birth/Sex: Treating RN: [birth year] (87 y.o. [name] Primary Care Gloria Lambertson: Velna Hatchet Other Clinician: Referring Gayanne Prescott: Treating Yared Julicia/Extender: Wendall Mola in Treatment: 5 Wound Status Wound Number: 6 Primary Inflammatory Etiology: Wound Location: Left, Posterior Lower Leg Wound Open Wounding Event: Trauma Status: Date Acquired: 12/28/2019 Comorbid Cataracts, Lymphedema, Hypertension, Peripheral Arterial Weeks Of Treatment: 5 History: Disease, Osteoarthritis Clustered Wound: No Wound Measurements Length: (cm) 0.7 Width: (cm) 1.1 Depth: (cm) 0.1 Area: (cm) 0.605 Volume: (cm) 0.06 % Reduction in Area: 14.4% % Reduction in Volume: 85.8% Epithelialization: Small (1-33%) Tunneling: No Undermining: No Wound Description Classification: Full Thickness Without Exposed Support Structures Wound Margin: Distinct, outline attached Exudate Amount: Medium Exudate Type: Serosanguineous Exudate Color: red, brown Foul Odor After Cleansing: No Slough/Fibrino Yes Wound Bed Granulation Amount: Large (67-100%) Exposed Structure Granulation Quality: Red Fascia Exposed: No Necrotic Amount: Small (1-33%) Fat Layer (Subcutaneous Tissue) Exposed: Yes Necrotic Quality: Adherent Slough Tendon Exposed: No Muscle Exposed: No Joint Exposed: No Bone Exposed: No Treatment Notes Wound #6 (Lower Leg) Wound Laterality: Left, Posterior Cleanser Peri-Wound Care Sween Lotion (Moisturizing lotion) Discharge Instruction: Apply moisturizing lotion as directed Topical Primary Dressing Hydrofera Blue Classic Foam, 2x2 in Discharge Instruction: Moisten with saline prior to applying to wound bed Secondary Dressing Woven Gauze Sponge, Non-Sterile 4x4 in Discharge Instruction: Apply over primary dressing as directed. Secured With Compression Wrap Kerlix  Roll 4.5x3.1 (in/yd) Discharge Instruction: Apply Kerlix and Coban compression as directed. Coban Self-Adherent Wrap 4x5 (in/yd) Discharge Instruction: Apply over Kerlix as directed. Compression Stockings Add-Ons Electronic Signature(s) Signed: 05/04/2020 4:05:28 PM By: Rhae Hammock RN Entered By: Rhae Hammock on 05/04/2020 14:53:03 -------------------------------------------------------------------------------- Vitals Details Patient Name: Date of Service: Judith Boone, Judith Robb Matar. 05/04/2020 3:00 PM Medical Record Number: [id number] Patient Account Number: [id number] Date of Birth/Sex: Treating RN: 11-02-1931 (87 y.o. [name] Primary Care Dorita Rowlands: Velna Hatchet Other Clinician: Referring Hani Campusano: Treating Bernal Luhman/Extender: Wendall Mola in Treatment: 5 Vital Signs Time Taken: 14:45 Temperature (F): 97.8 Height (in): 60 Pulse (bpm): 99 Weight (lbs): 138 Respiratory Rate (breaths/min): 18 Body Mass Index (BMI): 26.9 Blood Pressure (mmHg): 135/74 Reference Range: 80 - 120 mg / dl Electronic Signature(s) Signed: 05/04/2020 4:05:28 PM By: Rhae Hammock RN Entered By: Rhae Hammock on 05/04/2020 14:49:49

## 2020-05-04 NOTE — Progress Notes (Signed)
ZENNA, TRAISTER ([id number]) Visit Report for 05/04/2020 HPI Details Patient Name: Date of Service: [name] 05/04/2020 3:00 PM Medical Record Number: [id number] Patient Account Number: [id number] Date of Birth/Sex: Treating RN: 07-29-1931 (87 y.o. [name] Primary Care Provider: Velna Hatchet Other Clinician: Referring Provider: Treating Provider/Extender: Wendall Mola in Treatment: 5 History of Present Illness Location: left anterior shin to wounds about 2 months ago and on the left lateral calf 1 wound about 1 week ago Quality: Patient reports experiencing a dull pain to affected area(s). Severity: Patient states wound(s) are getting worse. Duration: Patient has had the wound for > 2 months prior to seeking treatment at the wound center Timing: Pain in wound is Intermittent (comes and goes Context: The wound occurred when the patient had a blunt object hit her anterior shin and she tore her skin on the left lateral calf while she was wearing her trousers Modifying Factors: Patient wound(s)/ulcer(s) are worsening due to :swelling and redness and discharge ssociated Signs and Symptoms: Patient reports having increase swelling. A HPI Description: 87 year old patient was seen at her PCPs office at [facility] for her left shin injury about a month ago. was seen in the ER for redness and drainage. She was started on Keflex 4 times a day and was then seen in follow-up in the office. Past medical history for GERD, right sciatica, COPD, DVT left lower extremity in May 2016. Also known to have a left femoral artery occlusion with collaterals and was seen in May 2016 by [facility]. He is status post bilateral knee arthroplasty, appendectomy, TAH/BSO in 2000. The patient was seen again in follow-up on 12/12/2016 with difficulty to heal wound due to peripheral artery disease and venous disease and there started on doxycycline 100 mg twice a day for 10  days and referred her to the wound center for an opinion. the patient has had a right lower extremity venous Doppler ultrasound and no evidence of DVT was found in the right lower extremity in July 2018. in May 2016 she had a lower extremity venous duplex evaluation done and there was deep venous reflux observed throughout the left lower extremity but there was no saphenous reflux. There was also focal deep vein thrombosis in one of the paired posterior tibial veins in the left calf. lower arterial study done in September 2015 showed right ABI of 1.08 and the left ABI of 0.87 with right toe brachial index of 0.71 in left toe brachial index of 0.87. 12/31/2016 -- had a lower extremity venous duplex reflux evaluation done which shows no evidence of bilateral lower extremity DVT There was reflux in . bilateral common femoral veins and left popliteal vein. Reflux in the right saphenofemoral junction and proximal great saphenous vein and in the great saphenous vein at the medial knee and proximal calf. Reflux in the right small saphenous vein midsegment. Reflux in the left saphenofemoral junction only. No reflux observed in the great saphenous vein and small saphenous vein. lower extremity arterial study done showed a left ABI -- 0.76 suggestive of moderate arterial occlusive disease and the right ABI -- 0.93 was within normal limits. toe brachial indices were 0.58 on the right and 0.50 on the left which were both abnormal. 01/07/2017 -- her PCP manage to get her an appointment to see Dr. Donnetta Hutching, tomorrow. Dr. Ardeth Perfect also said she should not be on any Lasix 01/14/2017 -- was seen by Dr. Althea Charon -- who reviewed the data  which showed she had reflux in the right leg in the common femoral vein and femoral vein odd and on the left side throughout the common femoral vein and femoral vein and popliteal vein. Arterial studies revealed normal ABIs on the right and a reduced ABI on the left with biphasic  waveform. He did not find any correctable superficial reflux and she did have mild arterial insufficiency but felt that it was adequate flow for healing these venous stasis ulcers without revascularization attempts. If she had progressive tissue loss she could undergo arteriography. He would see her back as needed 05/05/17 READMISSION This is a patient who is seen here earlier in the year for a wound on the left anterior lower leg. She had reflux studies and arterial studies done at that time and she saw Dr. early. Dr. Tawni Millers did not feel she had correctable superficial reflux. As far as arterial studies were concerned she had a left ABI of 0.76 a right ABI of 0.93 a TBI on the left at 0.50 and on the right at 0.5 date. She was felt to have mild arterial disease but blood flow was felt to be adequate for healing. The patient had a fall on November 30 causing wounds on the right lateral lower leg and the right mid humeral level. She is simply been covering these although recently she was put on cephalexin by her primary physician. She is here for review of these wounds. She has been compliant with the compression stockings that were ordered during her last admission here. These are 20-30 mm 05/12/17; we put Santyl on her right leg wound and wrapped her and the wound actually looks quite a bit healthier today. There is been very significant reduction in the skin tear in her right mid humerus. We xeroform The patient requests not to have her leg wrap this week, she has 20-30 mm compression stockings and would like me to prescribe Santyl so she can change the dressing even though I clearly explained it is likely we'll be changing to silver collagen or Hydrofera Blue next week 05/19/17; patient is changing her own dressing using Santyl on the right leg and Xeroform on the arm the remaining small areas on the arm are looking epithelialized I don't think the Xeroform is necessary simply cover with a foam border.  Still using Santyl to the right leg. No change in dimensions 05/26/17; the patient's wound on her arm has closed over. Changed her to [facility] last week from [place]. She still has a non viable surface today unfortunately 06/02/17; the patient's right arm wound remains closed over. Changed her to [facility] last week after a vigorous debridement of the right anterior lower leg wound. Surface looks better 06/09/17; the patient's right arm remains closed. She is been on Prisma now for 2 weeks with hydrogel. 06/16/17; traumatic wound on the right anterior leg. I switched 3 weeks ago to [facility] from [place] although we've made no real progress. Still a gritty surface. I changed her to Iodoflex today. She is still using her on 20-30 mm stockings 06/23/17; traumatic wound on the right anterior leg in the setting of chronic venous insufficiency. Changed her to Iodoflex last week because of continued need for surface debridement. She arrives today with a better-looking wound surface. She is using her own compression stockings 06/30/17; traumatic wound on the right anterior leg in the setting of chronic venous insufficiency. Much better-looking wound surface. Will change to [facility] today. She is using her own compression stockings  and the edema control looks adequate 07/07/17; traumatic wound on the right anterior leg in the setting of chronic venous insufficiency. Slightly smaller today using Hydrofera Blue 07/14/17-she is here for evaluation for right lower extremity ulcer. There is improvement in measurements and appearance. We will continue with same treatment plan and follow-up next week 07/23/17; right lower extremity ulcer. Continued improvement in measurements in appearance. Under the bright light there is still some surface slough although I elected not to debrided this today as the dimensions are significantly improved. We use Hydrofera Blue 08/04/17; right lower extremity traumatic ulcer in the setting of chronic  venous insufficiency. Somewhat surprisingly she is healed today. Totally epithelialized. She has 20-30 mm compression stockings READMISSION 03/30/2020 Mrs. Ahn is a now an 87 year old woman who was in this clinic in 2018 and then for 3 months in 2019 at which time she had had a fall and had a wound on her right leg. She was discharged with 20/30 stockings. She tells me that her problem here began in September [place] rubbing the posterior left calf on the steps. She came home with a "large bruise". This eventually opened. She is on Eliquis. She was seen by her primary on 01/29/2020 at which time she had left leg swelling a DVT rule out was negative she was given doxycycline. There was no mention of an actual wound. At some point this area actually opened. She simply been applying a Band-Aid over it currently and presented with 4 small interconnecting wounds on the posterior calf however there was 0.6 cm in depth and undermining. Past medical history; chronic venous insufficiency, paroxysmal A. fib on Eliquis, COPD, lumbar spondylosis, hyperlipidemia, chronic renal failure stage III hypertension ABI in our clinic was 0.52. Her last noninvasive studies were in 2018. At that point on the left her ABI was 0.76 her TBI was 0.50. She had a extensive evaluation of her vascular status in September 2018 by Dr. Darene Lamer early. It was generally felt that she had enough blood flow to heal the predominantly venous odd ulcers on her left lower leg. He was also noted that she had very significant venous hypertension related to deep venous reflux. At that point she did not have any correctable superficial reflux. Dr. Donnetta Hutching assessed her dorsalis pedis and posterior tibial with a hand-held Doppler and he felt that her flow was good 12/10; patient I admitted to the clinic last week. She had the remanence of a complex hematoma. Interconnecting wounds on the posterior  left calf. I removed the connecting skin to fully expose the wound. 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 We have been using Iodoflex she has edema in her legs she is very reluctant to continue the compression and really decided against that today. I am continuing the Iodoflex with border foam and using her own 20/30 mm stocking 12/27; some improvement in the wound surface. We have been using Iodoflex the patient did not want a wrap and therefore we have been using her own 20/30 stockings. She also has known PAD with a ABI in our clinic of 0.52. We may need to send her back to see vein and vascular [previously Dr. Early] if we can get this to close. 1/7; using Iodoflex on this initially traumatic wound on the left posterior calf. This was initially almost certainly the result of the  hematoma. We managed to get this to fill-in. It is epithelializing slowly. She probably also has chronic arterial and venous insufficiency Electronic Signature(s) Signed: 05/04/2020 4:48:18 PM By: Linton Ham MD Entered By: Linton Ham on 05/04/2020 15:23:28 -------------------------------------------------------------------------------- Physical Exam Details Patient Name: Date of Service: RO Hilbert Bible S. 05/04/2020 3:00 PM Medical Record Number: [id number] Patient Account Number: [id number] Date of Birth/Sex: Treating RN: 01/05/1932 (87 y.o. [name] Primary Care Provider: Velna Hatchet Other Clinician: Referring Provider: Treating Provider/Extender: Wendall Mola in Treatment: 5 Constitutional Sitting or standing Blood Pressure is within target range for patient.. Pulse regular and within target range for patient.Marland Kitchen Respirations regular, non-labored and within target range.. Temperature is normal and within the target range for the patient.Marland Kitchen Appears in no  distress. Cardiovascular Pedal pulses are not palpable at the dorsalis pedis or posterior tibial on the left. Marginal edema control. Probably some degree of chronic venous insufficiency. Notes Wound exam; left posterior calf.. Now a superficial wound with a healthy surface rim of epithelialization. No evidence of surrounding infection pedal pulses are not palpable. She has some degree of edema Electronic Signature(s) Signed: 05/04/2020 4:48:18 PM By: Linton Ham MD Entered By: Linton Ham on 05/04/2020 15:24:45 -------------------------------------------------------------------------------- Physician Orders Details Patient Name: Date of Service: [name]. 05/04/2020 3:00 PM Medical Record Number: [id number] Patient Account Number: [id number] Date of Birth/Sex: Treating RN: 02/13/1932 (87 y.o. [name] Primary Care Provider: Other Clinician: Velna Hatchet Referring Provider: Treating Provider/Extender: Wendall Mola in Treatment: 5 Verbal / Phone Orders: No Diagnosis Coding ICD-10 Coding Code Description S80.12XD Contusion of left lower leg, subsequent encounter L97.222 Non-pressure chronic ulcer of left calf with fat layer exposed I70.242 Atherosclerosis of native arteries of left leg with ulceration of calf Follow-up Appointments Return Appointment in 1 week. Bathing/ Shower/ Hygiene May shower with protection but do not get wound dressing(s) wet. Edema Control - Lymphedema / SCD / Other Bilateral Lower Extremities Elevate legs to the level of the heart or above for 30 minutes daily and/or when sitting, a frequency of: - throughout the day Avoid standing for long periods of time. Exercise regularly Moisturize legs daily. - right leg. Compression stocking or Garment 20-30 mm/Hg pressure to: - right leg only. apply in the morning and remove at night Wound Treatment Wound #6 - Lower Leg Wound Laterality: Left, Posterior Peri-Wound  Care: Sween Lotion (Moisturizing lotion) 1 x Per Week Discharge Instructions: Apply moisturizing lotion as directed Prim Dressing: Hydrofera Blue Classic Foam, 2x2 in 1 x Per Week ary Discharge Instructions: Moisten with saline prior to applying to wound bed Secondary Dressing: Woven Gauze Sponge, Non-Sterile 4x4 in 1 x Per Week Discharge Instructions: Apply over primary dressing as directed. Compression Wrap: Kerlix Roll 4.5x3.1 (in/yd) 1 x Per Week Discharge Instructions: Apply Kerlix and Coban compression as directed. Compression Wrap: Coban Self-Adherent Wrap 4x5 (in/yd) 1 x Per Week Discharge Instructions: Apply over Kerlix as directed. Electronic Signature(s) Signed: 05/04/2020 4:46:42 PM By: Baruch Gouty RN, BSN Signed: 05/04/2020 4:48:18 PM By: Linton Ham MD Entered By: Baruch Gouty on 05/04/2020 15:19:33 -------------------------------------------------------------------------------- Problem List Details Patient Name: Date of Service: [name], [name]. 05/04/2020 3:00 PM Medical Record Number: [id number] Patient Account Number: [id number] Date of Birth/Sex: Treating RN: October 17, 1931 (87 y.o. [name] Primary Care Provider: Velna Hatchet Other Clinician: Referring Provider: Treating Provider/Extender: Wendall Mola in Treatment: 5 Active Problems ICD-10 Encounter Code Description Active Date MDM  Diagnosis S80.12XD Contusion of left lower leg, subsequent encounter 03/30/2020 No Yes L97.222 Non-pressure chronic ulcer of left calf with fat layer exposed 03/30/2020 No Yes I70.242 Atherosclerosis of native arteries of left leg with ulceration of calf 03/30/2020 No Yes Inactive Problems Resolved Problems Electronic Signature(s) Signed: 05/04/2020 4:48:18 PM By: Linton Ham MD Entered By: Linton Ham on 05/04/2020 15:21:46 -------------------------------------------------------------------------------- Progress Note Details Patient  Name: Date of Service: [name]. 05/04/2020 3:00 PM Medical Record Number: [id number] Patient Account Number: [id number] Date of Birth/Sex: Treating RN: 29-Nov-1931 (87 y.o. [name] Primary Care Provider: Velna Hatchet Other Clinician: Referring Provider: Treating Provider/Extender: Wendall Mola in Treatment: 5 Subjective History of Present Illness (HPI) The following HPI elements were documented for the patient's wound: Location: left anterior shin to wounds about 2 months ago and on the left lateral calf 1 wound about 1 week ago Quality: Patient reports experiencing a dull pain to affected area(s). Severity: Patient states wound(s) are getting worse. Duration: Patient has had the wound for > 2 months prior to seeking treatment at the wound center Timing: Pain in wound is Intermittent (comes and goes Context: The wound occurred when the patient had a blunt object hit her anterior shin and she tore her skin on the left lateral calf while she was wearing her trousers Modifying Factors: Patient wound(s)/ulcer(s) are worsening due to :swelling and redness and discharge Associated Signs and Symptoms: Patient reports having increase swelling. 87 year old patient was seen at her PCPs office at [place] for her left shin injury about a month ago. was seen in the ER for redness and drainage. She was started on Keflex 4 times a day and was then seen in follow-up in the office. Past medical history for GERD, right sciatica, COPD, DVT left lower extremity in May 2016. Also known to have a left femoral artery occlusion with collaterals and was seen in May 2016 by [facility]. He is status post bilateral knee arthroplasty, appendectomy, TAH/BSO in 2000. The patient was seen again in follow-up on 12/12/2016 with difficulty to heal wound due to peripheral artery disease and venous disease and there started on doxycycline 100 mg twice a day for 10 days and  referred her to the wound center for an opinion. the patient has had a right lower extremity venous Doppler ultrasound and no evidence of DVT was found in the right lower extremity in July 2018. in May 2016 she had a lower extremity venous duplex evaluation done and there was deep venous reflux observed throughout the left lower extremity but there was no saphenous reflux. There was also focal deep vein thrombosis in one of the paired posterior tibial veins in the left calf. lower arterial study done in September 2015 showed right ABI of 1.08 and the left ABI of 0.87 with right toe brachial index of 0.71 in left toe brachial index of 0.87. 12/31/2016 -- had a lower extremity venous duplex reflux evaluation done which shows no evidence of bilateral lower extremity DVT There was reflux in . bilateral common femoral veins and left popliteal vein. Reflux in the right saphenofemoral junction and proximal great saphenous vein and in the great saphenous vein at the medial knee and proximal calf. Reflux in the right small saphenous vein midsegment. Reflux in the left saphenofemoral junction only. No reflux observed in the great saphenous vein and small saphenous vein. lower extremity arterial study done showed a left ABI -- 0.76 suggestive of moderate arterial occlusive disease  and the right ABI -- 0.93 was within normal limits. toe brachial indices were 0.58 on the right and 0.50 on the left which were both abnormal. 01/07/2017 -- her PCP manage to get her an appointment to see Dr. Donnetta Hutching, tomorrow. Dr. Ardeth Perfect also said she should not be on any Lasix 01/14/2017 -- was seen by Dr. Althea Charon -- who reviewed the data which showed she had reflux in the right leg in the common femoral vein and femoral vein odd and on the left side throughout the common femoral vein and femoral vein and popliteal vein. Arterial studies revealed normal ABIs on the right and a reduced ABI on the left with biphasic waveform. He did  not find any correctable superficial reflux and she did have mild arterial insufficiency but felt that it was adequate flow for healing these venous stasis ulcers without revascularization attempts. If she had progressive tissue loss she could undergo arteriography. He would see her back as needed 05/05/17 READMISSION This is a patient who is seen here earlier in the year for a wound on the left anterior lower leg. She had reflux studies and arterial studies done at that time and she saw Dr. early. Dr. Tawni Millers did not feel she had correctable superficial reflux. As far as arterial studies were concerned she had a left ABI of 0.76 a right ABI of 0.93 a TBI on the left at 0.50 and on the right at 0.5 date. She was felt to have mild arterial disease but blood flow was felt to be adequate for healing. The patient had a fall on November 30 causing wounds on the right lateral lower leg and the right mid humeral level. She is simply been covering these although recently she was put on cephalexin by her primary physician. She is here for review of these wounds. She has been compliant with the compression stockings that were ordered during her last admission here. These are 20-30 mm 05/12/17; we put Santyl on her right leg wound and wrapped her and the wound actually looks quite a bit healthier today. There is been very significant reduction in the skin tear in her right mid humerus. We xeroform The patient requests not to have her leg wrap this week, she has 20-30 mm compression stockings and would like me to prescribe Santyl so she can change the dressing even though I clearly explained it is likely we'll be changing to silver collagen or Hydrofera Blue next week 05/19/17; patient is changing her own dressing using Santyl on the right leg and Xeroform on the arm the remaining small areas on the arm are looking epithelialized I don't think the Xeroform is necessary simply cover with a foam border. Still using  Santyl to the right leg. No change in dimensions 05/26/17; the patient's wound on her arm has closed over. Changed her to [place] last week from [place]. She still has a non viable surface today unfortunately 06/02/17; the patient's right arm wound remains closed over. Changed her to [facility] last week after a vigorous debridement of the right anterior lower leg wound. Surface looks better 06/09/17; the patient's right arm remains closed. She is been on Prisma now for 2 weeks with hydrogel. 06/16/17; traumatic wound on the right anterior leg. I switched 3 weeks ago to [facility] from [place] although we've made no real progress. Still a gritty surface. I changed her to Iodoflex today. She is still using her on 20-30 mm stockings 06/23/17; traumatic wound on the right anterior leg in  the setting of chronic venous insufficiency. Changed her to Iodoflex last week because of continued need for surface debridement. She arrives today with a better-looking wound surface. She is using her own compression stockings 06/30/17; traumatic wound on the right anterior leg in the setting of chronic venous insufficiency. Much better-looking wound surface. Will change to [facility] today. She is using her own compression stockings and the edema control looks adequate 07/07/17; traumatic wound on the right anterior leg in the setting of chronic venous insufficiency. Slightly smaller today using Hydrofera Blue 07/14/17-she is here for evaluation for right lower extremity ulcer. There is improvement in measurements and appearance. We will continue with same treatment plan and follow-up next week 07/23/17; right lower extremity ulcer. Continued improvement in measurements in appearance. Under the bright light there is still some surface slough although I elected not to debrided this today as the dimensions are significantly improved. We use Hydrofera Blue 08/04/17; right lower extremity traumatic ulcer in the setting of chronic venous  insufficiency. Somewhat surprisingly she is healed today. T otally epithelialized. She has 20-30 mm compression stockings READMISSION 03/30/2020 Mrs. Bow is a now an 87 year old woman who was in this clinic in 2018 and then for 3 months in 2019 at which time she had had a fall and had a wound on her right leg. She was discharged with 20/30 stockings. She tells me that her problem here began in September she [place] and [place] rubbing the posterior left calf on the steps. She came home with a "large bruise". This eventually opened. She is on Eliquis. She was seen by her primary on 01/29/2020 at which time she had left leg swelling a DVT rule out was negative she was given doxycycline. There was no mention of an actual wound. At some point this area actually opened. She simply been applying a Band-Aid over it currently and presented with 4 small interconnecting wounds on the posterior calf however there was 0.6 cm in depth and undermining. Past medical history; chronic venous insufficiency, paroxysmal A. fib on Eliquis, COPD, lumbar spondylosis, hyperlipidemia, chronic renal failure stage III hypertension ABI in our clinic was 0.52. Her last noninvasive studies were in 2018. At that point on the left her ABI was 0.76 her TBI was 0.50. She had a extensive evaluation of her vascular status in September 2018 by Dr. Darene Lamer early. It was generally felt that she had enough blood flow to heal the predominantly venous odd ulcers on her left lower leg. He was also noted that she had very significant venous hypertension related to deep venous reflux. At that point she did not have any correctable superficial reflux. Dr. Donnetta Hutching assessed her dorsalis pedis and posterior tibial with a hand-held Doppler and he felt that her flow was good 12/10; patient I admitted to the clinic last week. She had the remanence of a complex hematoma. Interconnecting wounds on the posterior left  calf. I removed the connecting skin to fully expose the wound. 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 We have been using Iodoflex she has edema in her legs she is very reluctant to continue the compression and really decided against that today. I am continuing the Iodoflex with border foam and using her own 20/30 mm stocking 12/27; some improvement in the wound surface. We have been using Iodoflex the patient did not want a wrap  and therefore we have been using her own 20/30 stockings. She also has known PAD with a ABI in our clinic of 0.52. We may need to send her back to see vein and vascular [previously Dr. Early] if we can get this to close. 1/7; using Iodoflex on this initially traumatic wound on the left posterior calf. This was initially almost certainly the result of the hematoma. We managed to get this to fill-in. It is epithelializing slowly. She probably also has chronic arterial and venous insufficiency Objective Constitutional Sitting or standing Blood Pressure is within target range for patient.. Pulse regular and within target range for patient.Marland Kitchen Respirations regular, non-labored and within target range.. Temperature is normal and within the target range for the patient.Marland Kitchen Appears in no distress. Vitals Time Taken: 2:45 PM, Height: 60 in, Weight: 138 lbs, BMI: 26.9, Temperature: 97.8 F, Pulse: 99 bpm, Respiratory Rate: 18 breaths/min, Blood Pressure: 135/74 mmHg. Cardiovascular Pedal pulses are not palpable at the dorsalis pedis or posterior tibial on the left. Marginal edema control. Probably some degree of chronic venous insufficiency. General Notes: Wound exam; left posterior calf.. Now a superficial wound with a healthy surface rim of epithelialization. No evidence of surrounding infection pedal pulses are not palpable. She has some degree of edema Integumentary (Hair, Skin) Wound #6  status is Open. Original cause of wound was Trauma. The wound is located on the Left,Posterior Lower Leg. The wound measures 0.7cm length x 1.1cm width x 0.1cm depth; 0.605cm^2 area and 0.06cm^3 volume. There is Fat Layer (Subcutaneous Tissue) exposed. There is no tunneling or undermining noted. There is a medium amount of serosanguineous drainage noted. The wound margin is distinct with the outline attached to the wound base. There is large (67-100%) red granulation within the wound bed. There is a small (1-33%) amount of necrotic tissue within the wound bed including Adherent Slough. 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 Bathing/ Shower/ Hygiene: May shower with protection but do not get wound dressing(s) wet. Edema Control - Lymphedema / SCD / Other: Elevate legs to the level of the heart or above for 30 minutes daily and/or when sitting, a frequency of: - throughout the day Avoid standing for long periods of time. Exercise regularly Moisturize legs daily. - right leg. Compression stocking or Garment 20-30 mm/Hg pressure to: - right leg only. apply in the morning and remove at night WOUND #6: - Lower Leg Wound Laterality: Left, Posterior Peri-Wound Care: Sween Lotion (Moisturizing lotion) 1 x Per Week/ Discharge Instructions: Apply moisturizing lotion as directed Prim Dressing: Hydrofera Blue Classic Foam, 2x2 in 1 x Per Week/ ary Discharge Instructions: Moisten with saline prior to applying to wound bed Secondary Dressing: Woven Gauze Sponge, Non-Sterile 4x4 in 1 x Per Week/ Discharge Instructions: Apply over primary dressing as directed. Com pression Wrap: Kerlix Roll 4.5x3.1 (in/yd) 1 x Per Week/ Discharge Instructions: Apply Kerlix and Coban  compression as directed. Com pression Wrap: Coban Self-Adherent Wrap 4x5 (in/yd) 1 x Per Week/ Discharge Instructions: Apply over Kerlix as directed. 1. Continue gradual improvement here. 2. Is the surface of the wound looks healthy and granulated I am going to change the primary dressing from Iodoflex to [facility] Blue 3. I think the patient has both arterial and venous insufficiency. I am hoping to get this area to close  with out more aggressive testing. 4. Continue with kerlix Coban wrap Electronic Signature(s) Signed: 05/04/2020 4:48:18 PM By: Linton Ham MD Entered By: Linton Ham on 05/04/2020 15:28:20 -------------------------------------------------------------------------------- SuperBill Details Patient Name: Date of Service: [name] 05/04/2020 Medical Record Number: [id number] Patient Account Number: [id number] Date of Birth/Sex: Treating RN: [birth year] (87 y.o. [name] Primary Care Provider: Velna Hatchet Other Clinician: Referring Provider: Treating Provider/Extender: Wendall Mola in Treatment: 5 Diagnosis Coding ICD-10 Codes Code Description S80.12XD Contusion of left lower leg, subsequent encounter L97.222 Non-pressure chronic ulcer of left calf with fat layer exposed I70.242 Atherosclerosis of native arteries of left leg with ulceration of calf Facility Procedures Electronic Signature(s) Signed: 05/04/2020 4:46:42 PM By: Baruch Gouty RN, BSN Signed: 05/04/2020 4:48:18 PM By: Linton Ham MD Entered By: Baruch Gouty on 05/04/2020 15:18:26

## 2020-05-08 DIAGNOSIS — Z85828 Personal history of other malignant neoplasm of skin: Secondary | ICD-10-CM | POA: Diagnosis not present

## 2020-05-08 DIAGNOSIS — C44319 Basal cell carcinoma of skin of other parts of face: Secondary | ICD-10-CM | POA: Diagnosis not present

## 2020-05-11 ENCOUNTER — Other Ambulatory Visit: Payer: Self-pay

## 2020-05-11 ENCOUNTER — Encounter (HOSPITAL_BASED_OUTPATIENT_CLINIC_OR_DEPARTMENT_OTHER): Payer: Medicare Other | Admitting: Internal Medicine

## 2020-05-11 DIAGNOSIS — L97222 Non-pressure chronic ulcer of left calf with fat layer exposed: Secondary | ICD-10-CM | POA: Diagnosis not present

## 2020-05-11 DIAGNOSIS — S8012XA Contusion of left lower leg, initial encounter: Secondary | ICD-10-CM | POA: Diagnosis not present

## 2020-05-11 DIAGNOSIS — I872 Venous insufficiency (chronic) (peripheral): Secondary | ICD-10-CM | POA: Diagnosis not present

## 2020-05-11 DIAGNOSIS — L97822 Non-pressure chronic ulcer of other part of left lower leg with fat layer exposed: Secondary | ICD-10-CM | POA: Diagnosis not present

## 2020-05-11 DIAGNOSIS — I70242 Atherosclerosis of native arteries of left leg with ulceration of calf: Secondary | ICD-10-CM | POA: Diagnosis not present

## 2020-05-11 DIAGNOSIS — I890 Lymphedema, not elsewhere classified: Secondary | ICD-10-CM | POA: Diagnosis not present

## 2020-05-16 NOTE — Progress Notes (Signed)
SHILAH, HEFEL ([id number]) Visit Report for 05/11/2020 Arrival Information Details Patient Name: Date of Service: [name] 05/11/2020 11:00 A M Medical Record Number: [id number] Patient Account Number: [id number] Date of Birth/Sex: Treating RN: 09-22-1931 (87 y.o. [name] Primary Care Hashem Goynes: Velna Hatchet Other Clinician: Referring Tiegan Jambor: Treating Ahni Bradwell/Extender: Wendall Mola in Treatment: 6 Visit Information History Since Last Visit Added or deleted any medications: No Patient Arrived: Gilford Rile Any new allergies or adverse reactions: No Arrival Time: 11:25 Had a fall or experienced change in No Accompanied By: self activities of daily living that may affect Transfer Assistance: None risk of falls: Patient Identification Verified: Yes Signs or symptoms of abuse/neglect since last visito No Secondary Verification Process Completed: Yes Hospitalized since last visit: No Patient Requires Transmission-Based Precautions: No Implantable device outside of the clinic excluding No Patient Has Alerts: No cellular tissue based products placed in the center since last visit: Has Dressing in Place as Prescribed: Yes Has Compression in Place as Prescribed: Yes Pain Present Now: No Electronic Signature(s) Signed: 05/11/2020 5:10:56 PM By: Deon Pilling Entered By: Deon Pilling on 05/11/2020 11:32:26 -------------------------------------------------------------------------------- Clinic Level of Care Assessment Details Patient Name: Date of Service: [name] 05/11/2020 11:00 A M Medical Record Number: [id number] Patient Account Number: [id number] Date of Birth/Sex: Treating RN: [birth year] (87 y.o. [name] Primary Care Gean Laursen: Velna Hatchet Other Clinician: Referring Hellon Vaccarella: Treating Ardys Hataway/Extender: Wendall Mola in Treatment: 6 Clinic Level of Care Assessment Items TOOL 4 Quantity  Score _0  - 0 Use when only an EandM is performed on FOLLOW-UP visit ASSESSMENTS - Nursing Assessment / Reassessment X- 1 10 Reassessment of Co-morbidities (includes updates in patient status) X- 1 5 Reassessment of Adherence to Treatment Plan ASSESSMENTS - Wound and Skin A ssessment / Reassessment X - Simple Wound Assessment / Reassessment - one wound 1 5 _1  - 0 Complex Wound Assessment / Reassessment - multiple wounds _2  - 0 Dermatologic / Skin Assessment (not related to wound area) ASSESSMENTS - Focused Assessment X- 1 5 Circumferential Edema Measurements - multi extremities _3  - 0 Nutritional Assessment / Counseling / Intervention X- 1 5 Lower Extremity Assessment (monofilament, tuning fork, pulses) _4  - 0 Peripheral Arterial Disease Assessment (using hand held doppler) ASSESSMENTS - Ostomy and/or Continence Assessment and Care _5  - 0 Incontinence Assessment and Management _6  - 0 Ostomy Care Assessment and Management (repouching, etc.) PROCESS - Coordination of Care X - Simple Patient / Family Education for ongoing care 1 15 _7  - 0 Complex (extensive) Patient / Family Education for ongoing care X- 1 10 Staff obtains Programmer, systems, Records, T Results / Process Orders est _8  - 0 Staff telephones HHA, Nursing Homes / Clarify orders / etc _9  - 0 Routine Transfer to another Facility (non-emergent condition) _10  - 0 Routine Hospital Admission (non-emergent condition) _11  - 0 New Admissions / Biomedical engineer / Ordering NPWT Apligraf, etc. , _12  - 0 Emergency Hospital Admission (emergent condition) X- 1 10 Simple Discharge Coordination _13  - 0 Complex (extensive) Discharge Coordination PROCESS - Special Needs _14  - 0 Pediatric / Minor Patient Management _15  - 0 Isolation Patient Management _16  - 0 Hearing / Language / Visual special needs _17  - 0 Assessment of Community assistance (transportation, D/C planning, etc.) _18  - 0 Additional assistance / Altered  mentation _19  - 0 Support Surface(s) Assessment (bed, cushion, seat, etc.) INTERVENTIONS - Wound Cleansing / Measurement X - Simple Wound Cleansing - one wound 1 5 _20  -  0 Complex Wound Cleansing - multiple wounds X- 1 5 Wound Imaging (photographs - any number of wounds) _0  - 0 Wound Tracing (instead of photographs) X- 1 5 Simple Wound Measurement - one wound _1  - 0 Complex Wound Measurement - multiple wounds INTERVENTIONS - Wound Dressings X - Small Wound Dressing one or multiple wounds 1 10 _2  - 0 Medium Wound Dressing one or multiple wounds _3  - 0 Large Wound Dressing one or multiple wounds X- 1 5 Application of Medications - topical <[id number]>_1<\/[id number]>_4  - 0 Application of Medications - injection INTERVENTIONS - Miscellaneous _5  - 0 External ear exam _6  - 0 Specimen Collection (cultures, biopsies, blood, body fluids, etc.) _7  - 0 Specimen(s) / Culture(s) sent or taken to Lab for analysis _8  - 0 Patient Transfer (multiple staff / Civil Service fast streamer / Similar devices) _9  - 0 Simple Staple / Suture removal (25 or less) _10  - 0 Complex Staple / Suture removal (26 or more) _11  - 0 Hypo / Hyperglycemic Management (close monitor of Blood Glucose) _12  - 0 Ankle / Brachial Index (ABI) - do not check if billed separately X- 1 5 Vital Signs Has the patient been seen at the hospital within the last three years: Yes Total Score: 100 Level Of Care: New/Established - Level 3 Electronic Signature(s) Signed: 05/16/2020 12:00:41 PM By: Baruch Gouty RN, BSN Entered By: Baruch Gouty on 05/11/2020 12:02:34 -------------------------------------------------------------------------------- Encounter Discharge Information Details Patient Name: Date of Service: [name]. 05/11/2020 11:00 A M Medical Record Number: [id number] Patient Account Number: [id number] Date of Birth/Sex: Treating RN: Nov 05, 1931 (87 y.o. [name] Primary Care Diane Hanel: Velna Hatchet Other Clinician: Referring  Demarian Epps: Treating Gailya Tauer/Extender: Wendall Mola in Treatment: 6 Encounter Discharge Information Items Discharge Condition: Stable Ambulatory Status: Walker Discharge Destination: Home Transportation: Private Auto Accompanied By: self Schedule Follow-up Appointment: Yes Clinical Summary of Care: Patient Declined Electronic Signature(s) Signed: 05/16/2020 12:00:41 PM By: Baruch Gouty RN, BSN Entered By: Baruch Gouty on 05/11/2020 12:13:31 -------------------------------------------------------------------------------- Lower Extremity Assessment Details Patient Name: Date of Service: [name]. 05/11/2020 11:00 A M Medical Record Number: [id number] Patient Account Number: [id number] Date of Birth/Sex: Treating RN: 16-Dec-1931 (87 y.o. [name] Primary Care Kristyanna Barcelo: Velna Hatchet Other Clinician: Referring Lannie Heaps: Treating Wellington Winegarden/Extender: Wendall Mola in Treatment: 6 Edema Assessment Assessed: [Left: Yes] [Right: No] Edema: [Left: N] [Right: o] Calf Left: Right: Point of Measurement: 36 cm From Medial Instep 29.5 cm Ankle Left: Right: Point of Measurement: 10 cm From Medial Instep 22 cm Vascular Assessment Pulses: Dorsalis Pedis Palpable: [Left:Yes] Electronic Signature(s) Signed: 05/11/2020 5:10:56 PM By: Deon Pilling Entered By: Deon Pilling on 05/11/2020 11:33:04 -------------------------------------------------------------------------------- Multi Wound Chart Details Patient Name: Date of Service: [name]. 05/11/2020 11:00 A M Medical Record Number: [id number] Patient Account Number: [id number] Date of Birth/Sex: Treating RN: Sep 19, 1931 (87 y.o. [name] Primary Care Ada Woodbury: Velna Hatchet Other Clinician: Referring Surah Pelley: Treating Eino Whitner/Extender: Wendall Mola in Treatment: 6 Vital Signs Height(in): 60 Pulse(bpm): 76 Weight(lbs):  138 Blood Pressure(mmHg): 158/74 Body Mass Index(BMI): 27 Temperature(F): 97.5 Respiratory Rate(breaths/min): 18 Photos: [6:No Photos Left, Posterior Lower Leg] [N/A:N/A N/A] Wound Location: [6:Trauma] [N/A:N/A] Wounding Event: [6:Inflammatory] [N/A:N/A] Primary Etiology: [6:Cataracts, Lymphedema,] [N/A:N/A] Comorbid History: [6:Hypertension, Peripheral Arterial Disease, Osteoarthritis 12/28/2019] [N/A:N/A] Date Acquired: [6:6] [N/A:N/A] Weeks of Treatment: [6:Open] [N/A:N/A] Wound Status: [6:0.5x0.6x0.1] [N/A:N/A] Measurements L x W x D (cm) [6:0.236] [N/A:N/A] A (cm) : rea [6:0.024] [N/A:N/A] Volume (cm) : [  6:66.60%] [N/A:N/A] % Reduction in Area: [6:94.30%] [N/A:N/A] % Reduction in Volume: [6:Full Thickness Without Exposed] [N/A:N/A] Classification: [6:Support Structures Medium] [N/A:N/A] Exudate Amount: [6:Serosanguineous] [N/A:N/A] Exudate Type: [6:red, brown] [N/A:N/A] Exudate Color: [6:Distinct, outline attached] [N/A:N/A] Wound Margin: [6:Large (67-100%)] [N/A:N/A] Granulation Amount: [6:Red] [N/A:N/A] Granulation Quality: [6:Small (1-33%)] [N/A:N/A] Necrotic Amount: [6:Fat Layer (Subcutaneous Tissue): Yes N/A] Exposed Structures: [6:Fascia: No Tendon: No Muscle: No Joint: No Bone: No Medium (34-66%)] [N/A:N/A] Treatment Notes Wound #6 (Lower Leg) Wound Laterality: Left, Posterior Cleanser Peri-Wound Care Topical Primary Dressing Hydrofera Blue Classic Foam, 2x2 in Discharge Instruction: Moisten with saline prior to applying to wound bed Secondary Dressing ComfortFoam Border, 4x4 in (silicone border) Discharge Instruction: Apply over primary dressing as directed. Secured With Compression Wrap Compression Stockings Environmental education officer) Signed: 05/11/2020 4:09:21 PM By: Linton Ham MD Signed: 05/16/2020 12:00:41 PM By: Baruch Gouty RN, BSN Entered By: Linton Ham on 05/11/2020  12:32:41 -------------------------------------------------------------------------------- Multi-Disciplinary Care Plan Details Patient Name: Date of Service: [name], [name]. 05/11/2020 11:00 A M Medical Record Number: [id number] Patient Account Number: [id number] Date of Birth/Sex: Treating RN: 30-Mar-1932 (87 y.o. [name] Primary Care Endora Teresi: Velna Hatchet Other Clinician: Referring Kerensa Nicklas: Treating Zahid Carneiro/Extender: Wendall Mola in Treatment: 6 Active Inactive Abuse / Safety / Falls / Self Care Management Nursing Diagnoses: Potential for falls Goals: Patient/caregiver will verbalize/demonstrate measures taken to prevent injury and/or falls Date Initiated: 03/30/2020 Target Resolution Date: 05/17/2020 Goal Status: Active Interventions: Assess fall risk on admission and as needed Notes: Venous Leg Ulcer Nursing Diagnoses: Knowledge deficit related to disease process and management Potential for venous Insuffiency (use before diagnosis confirmed) Goals: Patient will maintain optimal edema control Date Initiated: 03/30/2020 Target Resolution Date: 06/08/2020 Goal Status: Active Interventions: Assess peripheral edema status every visit. Compression as ordered Provide education on venous insufficiency Treatment Activities: Therapeutic compression applied : 03/30/2020 Notes: Wound/Skin Impairment Nursing Diagnoses: Impaired tissue integrity Knowledge deficit related to ulceration/compromised skin integrity Goals: Patient/caregiver will verbalize understanding of skin care regimen Date Initiated: 03/30/2020 Date Inactivated: 04/23/2020 Target Resolution Date: 04/27/2020 Goal Status: Met Ulcer/skin breakdown will have a volume reduction of 30% by week 4 Date Initiated: 03/30/2020 Date Inactivated: 05/04/2020 Target Resolution Date: 05/03/2020 Goal Status: Met Ulcer/skin breakdown will have a volume reduction of 50% by week 8 Date  Initiated: 05/04/2020 Date Inactivated: 05/11/2020 Target Resolution Date: 06/01/2020 Goal Status: Met Ulcer/skin breakdown will have a volume reduction of 80% by week 12 Date Initiated: 05/11/2020 Target Resolution Date: 06/08/2020 Goal Status: Active Interventions: Assess patient/caregiver ability to obtain necessary supplies Assess patient/caregiver ability to perform ulcer/skin care regimen upon admission and as needed Assess ulceration(s) every visit Provide education on ulcer and skin care Treatment Activities: Skin care regimen initiated : 03/30/2020 Topical wound management initiated : 03/30/2020 Notes: Electronic Signature(s) Signed: 05/16/2020 12:00:41 PM By: Baruch Gouty RN, BSN Entered By: Baruch Gouty on 05/11/2020 12:01:38 -------------------------------------------------------------------------------- Pain Assessment Details Patient Name: Date of Service: [name]. 05/11/2020 11:00 A M Medical Record Number: [id number] Patient Account Number: [id number] Date of Birth/Sex: Treating RN: 08-22-1931 (87 y.o. [name] Primary Care Dwanda Tufano: Velna Hatchet Other Clinician: Referring Estefania Kamiya: Treating Kaylani Fromme/Extender: Wendall Mola in Treatment: 6 Active Problems Location of Pain Severity and Description of Pain Patient Has Paino No Site Locations Rate the pain. Current Pain Level: 0 Pain Management and Medication Current Pain Management: Medication: No Cold Application: No Rest: No Massage: No Activity: No T.E.N.S.: No Heat Application: No Leg drop  or elevation: No Is the Current Pain Management Adequate: Adequate How does your wound impact your activities of daily livingo Sleep: No Bathing: No Appetite: No Relationship With Others: No Bladder Continence: No Emotions: No Bowel Continence: No Work: No Toileting: No Drive: No Dressing: No Hobbies: No Electronic Signature(s) Signed: 05/11/2020 5:10:56 PM By:  Deon Pilling Entered By: Deon Pilling on 05/11/2020 11:32:54 -------------------------------------------------------------------------------- Patient/Caregiver Education Details Patient Name: Date of Service: [name] 1/14/2022andnbsp11:00 A M Medical Record Number: [id number] Patient Account Number: [id number] Date of Birth/Gender: Treating RN: Jan 21, 1932 (87 y.o. [name] Primary Care Physician: Velna Hatchet Other Clinician: Referring Physician: Treating Physician/Extender: Wendall Mola in Treatment: 6 Education Assessment Education Provided To: Patient Education Topics Provided Venous: Methods: Explain/Verbal Responses: Reinforcements needed, State content correctly Wound/Skin Impairment: Methods: Explain/Verbal Responses: Reinforcements needed, State content correctly Electronic Signature(s) Signed: 05/16/2020 12:00:41 PM By: Baruch Gouty RN, BSN Entered By: Baruch Gouty on 05/11/2020 12:02:01 -------------------------------------------------------------------------------- Wound Assessment Details Patient Name: Date of Service: [name]. 05/11/2020 11:00 A M Medical Record Number: [id number] Patient Account Number: [id number] Date of Birth/Sex: Treating RN: 1932/02/25 (87 y.o. [name] Primary Care Jozi Malachi: Velna Hatchet Other Clinician: Referring Demari Kropp: Treating Sung Renton/Extender: Wendall Mola in Treatment: 6 Wound Status Wound Number: 6 Primary Inflammatory Etiology: Wound Location: Left, Posterior Lower Leg Wound Open Wounding Event: Trauma Status: Date Acquired: 12/28/2019 Comorbid Cataracts, Lymphedema, Hypertension, Peripheral Arterial Weeks Of Treatment: 6 History: Disease, Osteoarthritis Clustered Wound: No Photos Photo Uploaded By: Mikeal Hawthorne on 05/16/2020 10:45:48 Wound Measurements Length: (cm) 0.5 Width: (cm) 0.6 Depth: (cm) 0.1 Area: (cm)  0.236 Volume: (cm) 0.024 % Reduction in Area: 66.6% % Reduction in Volume: 94.3% Epithelialization: Medium (34-66%) Tunneling: No Undermining: No Wound Description Classification: Full Thickness Without Exposed Support Structures Wound Margin: Distinct, outline attached Exudate Amount: Medium Exudate Type: Serosanguineous Exudate Color: red, brown Foul Odor After Cleansing: No Slough/Fibrino Yes Wound Bed Granulation Amount: Large (67-100%) Exposed Structure Granulation Quality: Red Fascia Exposed: No Necrotic Amount: Small (1-33%) Fat Layer (Subcutaneous Tissue) Exposed: Yes Necrotic Quality: Adherent Slough Tendon Exposed: No Muscle Exposed: No Joint Exposed: No Bone Exposed: No Treatment Notes Wound #6 (Lower Leg) Wound Laterality: Left, Posterior Cleanser Peri-Wound Care Topical Primary Dressing Hydrofera Blue Classic Foam, 2x2 in Discharge Instruction: Moisten with saline prior to applying to wound bed Secondary Dressing ComfortFoam Border, 4x4 in (silicone border) Discharge Instruction: Apply over primary dressing as directed. Secured With Compression Wrap Compression Stockings Environmental education officer) Signed: 05/11/2020 5:10:56 PM By: Deon Pilling Entered By: Deon Pilling on 05/11/2020 11:33:22 -------------------------------------------------------------------------------- Vitals Details Patient Name: Date of Service: [name], [name]. 05/11/2020 11:00 A M Medical Record Number: [id number] Patient Account Number: [id number] Date of Birth/Sex: Treating RN: Nov 21, 1931 (87 y.o. [name] Primary Care Mohamed Portlock: Velna Hatchet Other Clinician: Referring Makenzy Krist: Treating Richmond Coldren/Extender: Wendall Mola in Treatment: 6 Vital Signs Time Taken: 11:25 Temperature (F): 97.5 Height (in): 60 Pulse (bpm): 73 Weight (lbs): 138 Respiratory Rate (breaths/min): 18 Body Mass Index (BMI): 26.9 Blood Pressure (mmHg):  158/74 Reference Range: 80 - 120 mg / dl Electronic Signature(s) Signed: 05/11/2020 5:10:56 PM By: Deon Pilling Entered By: Deon Pilling on 05/11/2020 11:32:44

## 2020-05-16 NOTE — Progress Notes (Signed)
Judith Boone ([id number]) Visit Report for 05/11/2020 HPI Details Patient Name: Date of Service: Judith Boone 05/11/2020 11:00 A M Medical Record Number: [id number] Patient Account Number: [id number] Date of Birth/Sex: Treating RN: [birth year] (87 y.o. Judith Boone Primary Care Provider: Velna Hatchet Other Clinician: Referring Provider: Treating Provider/Extender: Wendall Mola in Treatment: 6 History of Present Illness Location: left anterior shin to wounds about 2 months ago and on the left lateral calf 1 wound about 1 week ago Quality: Patient reports experiencing a dull pain to affected area(s). Severity: Patient states wound(s) are getting worse. Duration: Patient has had the wound for > 85 months prior to seeking treatment at the wound center Timing: Pain in wound is Intermittent (comes and goes Context: The wound occurred when the patient had a blunt object hit her anterior shin and she tore her skin on the left lateral calf while she was wearing her trousers Modifying Factors: Patient wound(s)/ulcer(s) are worsening due to :swelling and redness and discharge ssociated Signs and Symptoms: Patient reports having increase swelling. A HPI Description: 87 year old patient was seen at her PCPs office at [place] for her left shin injury about a month ago. was seen in the ER for redness and drainage. She was started on Keflex 4 times a day and was then seen in follow-up in the office. Past medical history for GERD, right sciatica, COPD, DVT left lower extremity in May 2016. Also known to have a left femoral artery occlusion with collaterals and was seen in May 2016 by [facility]. He is status post bilateral knee arthroplasty, appendectomy, TAH/BSO in 2000. The patient was seen again in follow-up on 12/12/2016 with difficulty to heal wound due to peripheral artery disease and venous disease and there started on doxycycline 100 mg twice a day for  10 days and referred her to the wound center for an opinion. the patient has had a right lower extremity venous Doppler ultrasound and no evidence of DVT was found in the right lower extremity in July 2018. in May 2016 she had a lower extremity venous duplex evaluation done and there was deep venous reflux observed throughout the left lower extremity but there was no saphenous reflux. There was also focal deep vein thrombosis in one of the paired posterior tibial veins in the left calf. lower arterial study done in September 2015 showed right ABI of 1.08 and the left ABI of 0.87 with right toe brachial index of 0.71 in left toe brachial index of 0.87. 12/31/2016 -- had a lower extremity venous duplex reflux evaluation done which shows no evidence of bilateral lower extremity DVT There was reflux in . bilateral common femoral veins and left popliteal vein. Reflux in the right saphenofemoral junction and proximal great saphenous vein and in the great saphenous vein at the medial knee and proximal calf. Reflux in the right small saphenous vein midsegment. Reflux in the left saphenofemoral junction only. No reflux observed in the great saphenous vein and small saphenous vein. lower extremity arterial study done showed a left ABI -- 0.76 suggestive of moderate arterial occlusive disease and the right ABI -- 0.93 was within normal limits. toe brachial indices were 0.58 on the right and 0.50 on the left which were both abnormal. 01/07/2017 -- her PCP manage to get her an appointment to see Dr. Donnetta Hutching, tomorrow. Dr. Ardeth Perfect also said she should not be on any Lasix 01/14/2017 -- was seen by Dr. Althea Charon -- who reviewed the  data which showed she had reflux in the right leg in the common femoral vein and femoral vein odd and on the left side throughout the common femoral vein and femoral vein and popliteal vein. Arterial studies revealed normal ABIs on the right and a reduced ABI on the left with biphasic  waveform. He did not find any correctable superficial reflux and she did have mild arterial insufficiency but felt that it was adequate flow for healing these venous stasis ulcers without revascularization attempts. If she had progressive tissue loss she could undergo arteriography. He would see her back as needed 05/05/17 READMISSION This is a patient who is seen here earlier in the year for a wound on the left anterior lower leg. She had reflux studies and arterial studies done at that time and she saw Dr. early. Dr. Tawni Millers did not feel she had correctable superficial reflux. As far as arterial studies were concerned she had a left ABI of 0.76 a right ABI of 0.93 a TBI on the left at 0.50 and on the right at 0.5 date. She was felt to have mild arterial disease but blood flow was felt to be adequate for healing. The patient had a fall on November 30 causing wounds on the right lateral lower leg and the right mid humeral level. She is simply been covering these although recently she was put on cephalexin by her primary physician. She is here for review of these wounds. She has been compliant with the compression stockings that were ordered during her last admission here. These are 20-30 mm 05/12/17; we put Santyl on her right leg wound and wrapped her and the wound actually looks quite a bit healthier today. There is been very significant reduction in the skin tear in her right mid humerus. We xeroform The patient requests not to have her leg wrap this week, she has 20-30 mm compression stockings and would like me to prescribe Santyl so she can change the dressing even though I clearly explained it is likely we'll be changing to silver collagen or Hydrofera Blue next week 05/19/17; patient is changing her own dressing using Santyl on the right leg and Xeroform on the arm the remaining small areas on the arm are looking epithelialized I don't think the Xeroform is necessary simply cover with a foam border.  Still using Santyl to the right leg. No change in dimensions 05/26/17; the patient's wound on her arm has closed over. Changed her to [place] last week from [place]. She still has a non viable surface today unfortunately 06/02/17; the patient's right arm wound remains closed over. Changed her to [facility] last week after a vigorous debridement of the right anterior lower leg wound. Surface looks better 06/09/17; the patient's right arm remains closed. She is been on Prisma now for 2 weeks with hydrogel. 06/16/17; traumatic wound on the right anterior leg. I switched 3 weeks ago to [facility] from [place] although we've made no real progress. Still a gritty surface. I changed her to Iodoflex today. She is still using her on 20-30 mm stockings 06/23/17; traumatic wound on the right anterior leg in the setting of chronic venous insufficiency. Changed her to Iodoflex last week because of continued need for surface debridement. She arrives today with a better-looking wound surface. She is using her own compression stockings 06/30/17; traumatic wound on the right anterior leg in the setting of chronic venous insufficiency. Much better-looking wound surface. Will change to [facility] today. She is using her own compression  stockings and the edema control looks adequate 07/07/17; traumatic wound on the right anterior leg in the setting of chronic venous insufficiency. Slightly smaller today using Hydrofera Blue 07/14/17-she is here for evaluation for right lower extremity ulcer. There is improvement in measurements and appearance. We will continue with same treatment plan and follow-up next week 07/23/17; right lower extremity ulcer. Continued improvement in measurements in appearance. Under the bright light there is still some surface slough although I elected not to debrided this today as the dimensions are significantly improved. We use Hydrofera Blue 08/04/17; right lower extremity traumatic ulcer in the setting of chronic  venous insufficiency. Somewhat surprisingly she is healed today. Totally epithelialized. She has 20-30 mm compression stockings READMISSION 03/30/2020 Judith Boone is a now an 87 year old woman who was in this clinic in 2018 and then for 3 months in 2019 at which time she had had a fall and had a wound on her right leg. She was discharged with 20/30 stockings. She tells me that her problem here began in September [place] rubbing the posterior left calf on the steps. She came home with a "large bruise". This eventually opened. She is on Eliquis. She was seen by her primary on 01/29/2020 at which time she had left leg swelling a DVT rule out was negative she was given doxycycline. There was no mention of an actual wound. At some point this area actually opened. She simply been applying a Band-Aid over it currently and presented with 4 small interconnecting wounds on the posterior calf however there was 0.6 cm in depth and undermining. Past medical history; chronic venous insufficiency, paroxysmal A. fib on Eliquis, COPD, lumbar spondylosis, hyperlipidemia, chronic renal failure stage III hypertension ABI in our clinic was 0.52. Her last noninvasive studies were in 2018. At that point on the left her ABI was 0.76 her TBI was 0.50. She had a extensive evaluation of her vascular status in September 2018 by Dr. Darene Lamer early. It was generally felt that she had enough blood flow to heal the predominantly venous odd ulcers on her left lower leg. He was also noted that she had very significant venous hypertension related to deep venous reflux. At that point she did not have any correctable superficial reflux. Dr. Donnetta Hutching assessed her dorsalis pedis and posterior tibial with a hand-held Doppler and he felt that her flow was good 12/10; patient I admitted to the clinic last week. She had the remanence of a complex hematoma. Interconnecting wounds on the posterior  left calf. I removed the connecting skin to fully expose the wound. 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 We have been using Iodoflex she has edema in her legs she is very reluctant to continue the compression and really decided against that today. I am continuing the Iodoflex with border foam and using her own 20/30 mm stocking 12/27; some improvement in the wound surface. We have been using Iodoflex the patient did not want a wrap and therefore we have been using her own 20/30 stockings. She also has known PAD with a ABI in our clinic of 0.52. We may need to send her back to see vein and vascular [previously Dr. Early] if we can get this to close. 1/7; using Iodoflex on this initially traumatic wound on the left posterior calf. This was initially almost certainly the result of  the hematoma. We managed to get this to fill-in. It is epithelializing slowly. She probably also has chronic arterial and venous insufficiency 1/14 we been using Iodoflex on this initially traumatic wound on the left posterior calf she probably has chronic venous insufficiency and PAD but we've been getting this to close down without any vascular studies. She does not want to be wrapped today she has 20/30 below-knee compression stocking Electronic Signature(s) Signed: 05/11/2020 4:09:21 PM By: Linton Ham MD Entered By: Linton Ham on 05/11/2020 12:35:02 -------------------------------------------------------------------------------- Physical Exam Details Patient Name: Date of Service: Judith Boone. 05/11/2020 11:00 A M Medical Record Number: [id number] Patient Account Number: [id number] Date of Birth/Sex: Treating RN: 1932/01/28 (87 y.o. Judith Boone Primary Care Provider: Velna Hatchet Other Clinician: Referring Provider: Treating Provider/Extender: Wendall Mola in Treatment:  6 Constitutional Patient is hypertensive.. Pulse regular and within target range for patient.Marland Kitchen Respirations regular, non-labored and within target range.. Temperature is normal and within the target range for the patient.. No change in height.. Appears in no distress. Notes Wound exam; left posterior calf. The wound is superficial and mostly epithelialized. There is no evidence of surrounding infection. She has some edema around the wound Electronic Signature(s) Signed: 05/11/2020 4:09:21 PM By: Linton Ham MD Entered By: Linton Ham on 05/11/2020 12:48:55 -------------------------------------------------------------------------------- Physician Orders Details Patient Name: Date of Service: Judith Boone. 05/11/2020 11:00 A M Medical Record Number: [id number] Patient Account Number: [id number] Date of Birth/Sex: Treating RN: Oct 30, 1931 (87 y.o. Judith Boone Primary Care Provider: Velna Hatchet Other Clinician: Referring Provider: Treating Provider/Extender: Wendall Mola in Treatment: 6 Verbal / Phone Orders: No Diagnosis Coding ICD-10 Coding Code Description S80.12XD Contusion of left lower leg, subsequent encounter L97.222 Non-pressure chronic ulcer of left calf with fat layer exposed I70.242 Atherosclerosis of native arteries of left leg with ulceration of calf Follow-up Appointments Return Appointment in 1 week. Bathing/ Shower/ Hygiene May shower and wash wound with soap and water. Edema Control - Lymphedema / SCD / Other Bilateral Lower Extremities Elevate legs to the level of the heart or above for 30 minutes daily and/or when sitting, a frequency of: - throughout the day Avoid standing for long periods of time. Exercise regularly Moisturize legs daily. - right leg. Compression stocking or Garment 20-30 mm/Hg pressure to: - both legs daily. apply in the morning and remove at night Wound Treatment Wound #6 - Lower Leg Wound  Laterality: Left, Posterior Prim Dressing: Hydrofera Blue Classic Foam, 2x2 in Every Other Day/7 Days ary Discharge Instructions: Moisten with saline prior to applying to wound bed Secondary Dressing: ComfortFoam Border, 4x4 in (silicone border) Every Other Day/7 Days Discharge Instructions: Apply over primary dressing as directed. Electronic Signature(s) Signed: 05/11/2020 4:09:21 PM By: Linton Ham MD Signed: 05/16/2020 12:00:41 PM By: Baruch Gouty RN, BSN Entered By: Baruch Gouty on 05/11/2020 12:05:23 -------------------------------------------------------------------------------- Problem List Details Patient Name: Date of Service: Judith Boone. 05/11/2020 11:00 A M Medical Record Number: [id number] Patient Account Number: [id number] Date of Birth/Sex: Treating RN: [birth year] (87 y.o. Judith Boone Primary Care Provider: Velna Hatchet Other Clinician: Referring Provider: Treating Provider/Extender: Wendall Mola in Treatment: 6 Active Problems ICD-10 Encounter Code Description Active Date MDM Diagnosis S80.12XD Contusion of left lower leg, subsequent encounter 03/30/2020 No Yes L97.222 Non-pressure chronic ulcer of left calf with fat layer exposed 03/30/2020 No Yes I70.242 Atherosclerosis of native arteries of left leg with ulceration of calf 03/30/2020  No Yes Inactive Problems Resolved Problems Electronic Signature(s) Signed: 05/11/2020 4:09:21 PM By: Linton Ham MD Entered By: Linton Ham on 05/11/2020 12:32:34 -------------------------------------------------------------------------------- Progress Note Details Patient Name: Date of Service: Judith Boone. 05/11/2020 11:00 A M Medical Record Number: [id number] Patient Account Number: [id number] Date of Birth/Sex: Treating RN: 1931/08/21 (87 y.o. Judith Boone Primary Care Provider: Velna Hatchet Other Clinician: Referring Provider: Treating Provider/Extender: Wendall Mola in Treatment: 6 Subjective History of Present Illness (HPI) The following HPI elements were documented for the patient's wound: Location: left anterior shin to wounds about 2 months ago and on the left lateral calf 1 wound about 1 week ago Quality: Patient reports experiencing a dull pain to affected area(s). Severity: Patient states wound(s) are getting worse. Duration: Patient has had the wound for > 85 months prior to seeking treatment at the wound center Timing: Pain in wound is Intermittent (comes and goes Context: The wound occurred when the patient had a blunt object hit her anterior shin and she tore her skin on the left lateral calf while she was wearing her trousers Modifying Factors: Patient wound(s)/ulcer(s) are worsening due to :swelling and redness and discharge Associated Signs and Symptoms: Patient reports having increase swelling. 87 year old patient was seen at her PCPs office at [place] for her left shin injury about a month ago. was seen in the ER for redness and drainage. She was started on Keflex 4 times a day and was then seen in follow-up in the office. Past medical history for GERD, right sciatica, COPD, DVT left lower extremity in May 2016. Also known to have a left femoral artery occlusion with collaterals and was seen in May 2016 by [facility]. He is status post bilateral knee arthroplasty, appendectomy, TAH/BSO in 2000. The patient was seen again in follow-up on 12/12/2016 with difficulty to heal wound due to peripheral artery disease and venous disease and there started on doxycycline 100 mg twice a day for 10 days and referred her to the wound center for an opinion. the patient has had a right lower extremity venous Doppler ultrasound and no evidence of DVT was found in the right lower extremity in July 2018. in May 2016 she had a lower extremity venous duplex evaluation done and there was deep venous reflux observed  throughout the left lower extremity but there was no saphenous reflux. There was also focal deep vein thrombosis in one of the paired posterior tibial veins in the left calf. lower arterial study done in September 2015 showed right ABI of 1.08 and the left ABI of 0.87 with right toe brachial index of 0.71 in left toe brachial index of 0.87. 12/31/2016 -- had a lower extremity venous duplex reflux evaluation done which shows no evidence of bilateral lower extremity DVT There was reflux in . bilateral common femoral veins and left popliteal vein. Reflux in the right saphenofemoral junction and proximal great saphenous vein and in the great saphenous vein at the medial knee and proximal calf. Reflux in the right small saphenous vein midsegment. Reflux in the left saphenofemoral junction only. No reflux observed in the great saphenous vein and small saphenous vein. lower extremity arterial study done showed a left ABI -- 0.76 suggestive of moderate arterial occlusive disease and the right ABI -- 0.93 was within normal limits. toe brachial indices were 0.58 on the right and 0.50 on the left which were both abnormal. 01/07/2017 -- her PCP manage to get her an appointment to  see Dr. Donnetta Hutching, tomorrow. Dr. Ardeth Perfect also said she should not be on any Lasix 01/14/2017 -- was seen by Dr. Althea Charon -- who reviewed the data which showed she had reflux in the right leg in the common femoral vein and femoral vein odd and on the left side throughout the common femoral vein and femoral vein and popliteal vein. Arterial studies revealed normal ABIs on the right and a reduced ABI on the left with biphasic waveform. He did not find any correctable superficial reflux and she did have mild arterial insufficiency but felt that it was adequate flow for healing these venous stasis ulcers without revascularization attempts. If she had progressive tissue loss she could undergo arteriography. He would see her back as  needed 05/05/17 READMISSION This is a patient who is seen here earlier in the year for a wound on the left anterior lower leg. She had reflux studies and arterial studies done at that time and she saw Dr. early. Dr. Tawni Millers did not feel she had correctable superficial reflux. As far as arterial studies were concerned she had a left ABI of 0.76 a right ABI of 0.93 a TBI on the left at 0.50 and on the right at 0.5 date. She was felt to have mild arterial disease but blood flow was felt to be adequate for healing. The patient had a fall on November 30 causing wounds on the right lateral lower leg and the right mid humeral level. She is simply been covering these although recently she was put on cephalexin by her primary physician. She is here for review of these wounds. She has been compliant with the compression stockings that were ordered during her last admission here. These are 20-30 mm 05/12/17; we put Santyl on her right leg wound and wrapped her and the wound actually looks quite a bit healthier today. There is been very significant reduction in the skin tear in her right mid humerus. We xeroform The patient requests not to have her leg wrap this week, she has 20-30 mm compression stockings and would like me to prescribe Santyl so she can change the dressing even though I clearly explained it is likely we'll be changing to silver collagen or Hydrofera Blue next week 05/19/17; patient is changing her own dressing using Santyl on the right leg and Xeroform on the arm the remaining small areas on the arm are looking epithelialized I don't think the Xeroform is necessary simply cover with a foam border. Still using Santyl to the right leg. No change in dimensions 05/26/17; the patient's wound on her arm has closed over. Changed her to [place] last week from [place]. She still has a non viable surface today unfortunately 06/02/17; the patient's right arm wound remains closed over. Changed her to [facility] last week  after a vigorous debridement of the right anterior lower leg wound. Surface looks better 06/09/17; the patient's right arm remains closed. She is been on Prisma now for 2 weeks with hydrogel. 06/16/17; traumatic wound on the right anterior leg. I switched 3 weeks ago to [facility] from [place] although we've made no real progress. Still a gritty surface. I changed her to Iodoflex today. She is still using her on 20-30 mm stockings 06/23/17; traumatic wound on the right anterior leg in the setting of chronic venous insufficiency. Changed her to Iodoflex last week because of continued need for surface debridement. She arrives today with a better-looking wound surface. She is using her own compression stockings 06/30/17; traumatic wound on  the right anterior leg in the setting of chronic venous insufficiency. Much better-looking wound surface. Will change to [facility] today. She is using her own compression stockings and the edema control looks adequate 07/07/17; traumatic wound on the right anterior leg in the setting of chronic venous insufficiency. Slightly smaller today using Hydrofera Blue 07/14/17-she is here for evaluation for right lower extremity ulcer. There is improvement in measurements and appearance. We will continue with same treatment plan and follow-up next week 07/23/17; right lower extremity ulcer. Continued improvement in measurements in appearance. Under the bright light there is still some surface slough although I elected not to debrided this today as the dimensions are significantly improved. We use Hydrofera Blue 08/04/17; right lower extremity traumatic ulcer in the setting of chronic venous insufficiency. Somewhat surprisingly she is healed today. Totally epithelialized. She has 20-30 mm compression stockings READMISSION 03/30/2020 Judith Boone is a now an 87 year old woman who was in this clinic in 2018 and then for 3 months in 2019 at which time she had had a fall and had a wound on  her right leg. She was discharged with 20/30 stockings. She tells me that her problem here began in September [place] rubbing the posterior left calf on the steps. She came home with a "large bruise". This eventually opened. She is on Eliquis. She was seen by her primary on 01/29/2020 at which time she had left leg swelling a DVT rule out was negative she was given doxycycline. There was no mention of an actual wound. At some point this area actually opened. She simply been applying a Band-Aid over it currently and presented with 4 small interconnecting wounds on the posterior calf however there was 0.6 cm in depth and undermining. Past medical history; chronic venous insufficiency, paroxysmal A. fib on Eliquis, COPD, lumbar spondylosis, hyperlipidemia, chronic renal failure stage III hypertension ABI in our clinic was 0.52. Her last noninvasive studies were in 2018. At that point on the left her ABI was 0.76 her TBI was 0.50. She had a extensive evaluation of her vascular status in September 2018 by Dr. Darene Lamer early. It was generally felt that she had enough blood flow to heal the predominantly venous odd ulcers on her left lower leg. He was also noted that she had very significant venous hypertension related to deep venous reflux. At that point she did not have any correctable superficial reflux. Dr. Donnetta Hutching assessed her dorsalis pedis and posterior tibial with a hand-held Doppler and he felt that her flow was good 12/10; patient I admitted to the clinic last week. She had the remanence of a complex hematoma. Interconnecting wounds on the posterior left calf. I removed the connecting skin to fully expose the wound. 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 We have been using Iodoflex she has edema in her legs she is very reluctant to continue the compression  and really decided against that today. I am continuing the Iodoflex with border foam and using her own 20/30 mm stocking 12/27; some improvement in the wound surface. We have been using Iodoflex the patient did not want a wrap and therefore we have been using her own 20/30 stockings. She also has known PAD with a ABI in our clinic of 0.52. We may need to send her back to see vein and vascular [previously Dr. Donnetta Hutching if  we can get this to close. 1/7; using Iodoflex on this initially traumatic wound on the left posterior calf. This was initially almost certainly the result of the hematoma. We managed to get this to fill-in. It is epithelializing slowly. She probably also has chronic arterial and venous insufficiency 1/14 we been using Iodoflex on this initially traumatic wound on the left posterior calf she probably has chronic venous insufficiency and PAD but we've been getting this to close down without any vascular studies. She does not want to be wrapped today she has 20/30 below-knee compression stocking Objective Constitutional Patient is hypertensive.. Pulse regular and within target range for patient.Marland Kitchen Respirations regular, non-labored and within target range.. Temperature is normal and within the target range for the patient.. No change in height.. Appears in no distress. Vitals Time Taken: 11:25 AM, Height: 60 in, Weight: 138 lbs, BMI: 26.9, Temperature: 97.5 F, Pulse: 73 bpm, Respiratory Rate: 18 breaths/min, Blood Pressure: 158/74 mmHg. General Notes: Wound exam; left posterior calf. The wound is superficial and mostly epithelialized. There is no evidence of surrounding infection. She has some edema around the wound Integumentary (Hair, Skin) Wound #6 status is Open. Original cause of wound was Trauma. The wound is located on the Left,Posterior Lower Leg. The wound measures 0.5cm length x 0.6cm width x 0.1cm depth; 0.236cm^2 area and 0.024cm^3 volume. There is Fat Layer (Subcutaneous  Tissue) exposed. There is no tunneling or undermining noted. There is a medium amount of serosanguineous drainage noted. The wound margin is distinct with the outline attached to the wound base. There is large (67-100%) red granulation within the wound bed. There is a small (1-33%) amount of necrotic tissue within the wound bed including Adherent Slough. 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 Bathing/ Shower/ Hygiene: May shower and wash wound with soap and water. Edema Control - Lymphedema / SCD / Other: Elevate legs to the level of the heart or above for 30 minutes daily and/or when sitting, a frequency of: - throughout the day Avoid standing for long periods of time. Exercise regularly Moisturize legs daily. - right leg. Compression stocking or Garment 20-30 mm/Hg pressure to: - both legs daily. apply in the morning and remove at night WOUND #6: - Lower Leg Wound Laterality: Left, Posterior Prim Dressing: Hydrofera Blue Classic Foam, 2x2 in Every Other Day/7 Days ary Discharge Instructions: Moisten with saline prior to applying to wound bed Secondary Dressing: ComfortFoam Border, 4x4 in (silicone border) Every Other Day/7 Days Discharge Instructions: Apply over primary dressing as directed. 1. I changed her to [facility] classic with border foam 2. She has a 20/30 mmHg stocking which we applied to the left leg she is going to change the dressing every second day 3. It is worth noting that I generally disagreed with the change in compression here and advise she continue this until the wound was closed however she wanted at least a week without the wrap and therefore we complied with her wishes Electronic Signature(s) Signed: 05/11/2020 4:09:21 PM  By: Linton Ham MD Entered By: Linton Ham on 05/11/2020 12:49:52 -------------------------------------------------------------------------------- SuperBill Details Patient Name: Date of Service: Judith Boone 05/11/2020 Medical Record Number: [id number] Patient Account Number: [id number] Date of Birth/Sex: Treating RN: August 13, 1931 (87 y.o. 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

## 2020-05-18 ENCOUNTER — Encounter (HOSPITAL_BASED_OUTPATIENT_CLINIC_OR_DEPARTMENT_OTHER): Payer: Medicare Other | Admitting: Internal Medicine

## 2020-05-18 ENCOUNTER — Other Ambulatory Visit: Payer: Self-pay

## 2020-05-18 DIAGNOSIS — I890 Lymphedema, not elsewhere classified: Secondary | ICD-10-CM | POA: Diagnosis not present

## 2020-05-18 DIAGNOSIS — I70242 Atherosclerosis of native arteries of left leg with ulceration of calf: Secondary | ICD-10-CM | POA: Diagnosis not present

## 2020-05-18 DIAGNOSIS — L97222 Non-pressure chronic ulcer of left calf with fat layer exposed: Secondary | ICD-10-CM | POA: Diagnosis not present

## 2020-05-18 DIAGNOSIS — S8012XA Contusion of left lower leg, initial encounter: Secondary | ICD-10-CM | POA: Diagnosis not present

## 2020-05-18 DIAGNOSIS — I872 Venous insufficiency (chronic) (peripheral): Secondary | ICD-10-CM | POA: Diagnosis not present

## 2020-05-18 DIAGNOSIS — L97822 Non-pressure chronic ulcer of other part of left lower leg with fat layer exposed: Secondary | ICD-10-CM | POA: Diagnosis not present

## 2020-05-18 NOTE — Progress Notes (Signed)
ALEIGHA, GILANI ([id number]) Visit Report for 05/18/2020 Arrival Information Details Patient Name: Date of Service: Judith Boone 05/18/2020 2:30 PM Medical Record Number: [id number] Patient Account Number: [id number] Date of Birth/Sex: Treating RN: [birth year] (87 y.o. [name] Primary Care Khaleesi Gruel: Velna Hatchet Other Clinician: Referring Chenise Mulvihill: Treating Melissaann Dizdarevic/Extender: Wendall Mola in Treatment: 7 Visit Information History Since Last Visit Added or deleted any medications: No Patient Arrived: Gilford Rile Any new allergies or adverse reactions: No Arrival Time: 14:20 Had a fall or experienced change in No Accompanied By: self activities of daily living that may affect Transfer Assistance: None risk of falls: Patient Identification Verified: Yes Signs or symptoms of abuse/neglect since last visito No Secondary Verification Process Completed: Yes Hospitalized since last visit: No Patient Requires Transmission-Based Precautions: No Implantable device outside of the clinic excluding No Patient Has Alerts: No cellular tissue based products placed in the center since last visit: Pain Present Now: No Electronic Signature(s) Signed: 05/18/2020 3:06:23 PM By: Mikeal Hawthorne EMT/HBOT/SD Entered By: Mikeal Hawthorne on 05/18/2020 14:28:24 -------------------------------------------------------------------------------- Clinic Level of Care Assessment Details Patient Name: Date of Service: Judith Boone. 05/18/2020 2:30 PM Medical Record Number: [id number] Patient Account Number: [id number] Date of Birth/Sex: Treating RN: [birth year] (87 y.o. [name] Primary Care Nicholus Chandran: Velna Hatchet Other Clinician: Referring Lorri Fukuhara: Treating Jahmir Salo/Extender: Wendall Mola in Treatment: 7 Clinic Level of Care Assessment Items TOOL 4 Quantity Score []  - 0 Use when only an EandM is performed on FOLLOW-UP visit ASSESSMENTS  - Nursing Assessment / Reassessment X- 1 10 Reassessment of Co-morbidities (includes updates in patient status) X- 1 5 Reassessment of Adherence to Treatment Plan ASSESSMENTS - Wound and Skin A ssessment / Reassessment X - Simple Wound Assessment / Reassessment - one wound 1 5 []  - 0 Complex Wound Assessment / Reassessment - multiple wounds []  - 0 Dermatologic / Skin Assessment (not related to wound area) ASSESSMENTS - Focused Assessment X- 1 5 Circumferential Edema Measurements - multi extremities []  - 0 Nutritional Assessment / Counseling / Intervention X- 1 5 Lower Extremity Assessment (monofilament, tuning fork, pulses) []  - 0 Peripheral Arterial Disease Assessment (using hand held doppler) ASSESSMENTS - Ostomy and/or Continence Assessment and Care []  - 0 Incontinence Assessment and Management []  - 0 Ostomy Care Assessment and Management (repouching, etc.) PROCESS - Coordination of Care X - Simple Patient / Family Education for ongoing care 1 15 []  - 0 Complex (extensive) Patient / Family Education for ongoing care X- 1 10 Staff obtains Programmer, systems, Records, T Results / Process Orders est []  - 0 Staff telephones HHA, Nursing Homes / Clarify orders / etc []  - 0 Routine Transfer to another Facility (non-emergent condition) []  - 0 Routine Hospital Admission (non-emergent condition) []  - 0 New Admissions / Biomedical engineer / Ordering NPWT Apligraf, etc. , []  - 0 Emergency Hospital Admission (emergent condition) X- 1 10 Simple Discharge Coordination []  - 0 Complex (extensive) Discharge Coordination PROCESS - Special Needs []  - 0 Pediatric / Minor Patient Management []  - 0 Isolation Patient Management []  - 0 Hearing / Language / Visual special needs []  - 0 Assessment of Community assistance (transportation, D/C planning, etc.) []  - 0 Additional assistance / Altered mentation []  - 0 Support Surface(s) Assessment (bed, cushion, seat, etc.) INTERVENTIONS -  Wound Cleansing / Measurement X - Simple Wound Cleansing - one wound 1 5 []  - 0 Complex Wound Cleansing - multiple wounds X- 1 5 Wound Imaging (photographs -  any number of wounds) []  - 0 Wound Tracing (instead of photographs) X- 1 5 Simple Wound Measurement - one wound []  - 0 Complex Wound Measurement - multiple wounds INTERVENTIONS - Wound Dressings X - Small Wound Dressing one or multiple wounds 1 10 []  - 0 Medium Wound Dressing one or multiple wounds []  - 0 Large Wound Dressing one or multiple wounds X- 1 5 Application of Medications - topical []  - 0 Application of Medications - injection INTERVENTIONS - Miscellaneous []  - 0 External ear exam []  - 0 Specimen Collection (cultures, biopsies, blood, body fluids, etc.) []  - 0 Specimen(s) / Culture(s) sent or taken to Lab for analysis []  - 0 Patient Transfer (multiple staff / Civil Service fast streamer / Similar devices) []  - 0 Simple Staple / Suture removal (25 or less) []  - 0 Complex Staple / Suture removal (26 or more) []  - 0 Hypo / Hyperglycemic Management (close monitor of Blood Glucose) []  - 0 Ankle / Brachial Index (ABI) - do not check if billed separately X- 1 5 Vital Signs Has the patient been seen at the hospital within the last three years: Yes Total Score: 100 Level Of Care: New/Established - Level 3 Electronic Signature(s) Signed: 05/18/2020 4:31:51 PM By: Baruch Gouty RN, BSN Entered By: Baruch Gouty on 05/18/2020 15:08:27 -------------------------------------------------------------------------------- Encounter Discharge Information Details Patient Name: Date of Service: Judith Boone. 05/18/2020 2:30 PM Medical Record Number: [id number] Patient Account Number: [id number] Date of Birth/Sex: Treating RN: Jan 19, 1932 (87 y.o. [name] Primary Care Callee Rohrig: Velna Hatchet Other Clinician: Referring Hester Forget: Treating Tyronica Truxillo/Extender: Wendall Mola in Treatment: 7 Encounter  Discharge Information Items Discharge Condition: Stable Ambulatory Status: Walker Discharge Destination: Home Transportation: Private Auto Accompanied By: self Schedule Follow-up Appointment: Yes Clinical Summary of Care: Patient Declined Electronic Signature(s) Signed: 05/18/2020 4:31:51 PM By: Baruch Gouty RN, BSN Entered By: Baruch Gouty on 05/18/2020 15:13:40 -------------------------------------------------------------------------------- Lower Extremity Assessment Details Patient Name: Date of Service: Judith Boone. 05/18/2020 2:30 PM Medical Record Number: [id number] Patient Account Number: [id number] Date of Birth/Sex: Treating RN: Aug 31, 1931 (87 y.o. [name] Primary Care Dontrez Pettis: Velna Hatchet Other Clinician: Referring Thera Basden: Treating Haizlee Henton/Extender: Wendall Mola in Treatment: 7 Edema Assessment Assessed: [Left: No] [Right: No] Edema: [Left: N] [Right: o] Calf Left: Right: Point of Measurement: 36 cm From Medial Instep 31.5 cm Ankle Left: Right: Point of Measurement: 10 cm From Medial Instep 24 cm Vascular Assessment Pulses: Dorsalis Pedis Palpable: [Left:Yes] Electronic Signature(s) Signed: 05/18/2020 3:06:23 PM By: Mikeal Hawthorne EMT/HBOT/SD Signed: 05/18/2020 4:31:51 PM By: Baruch Gouty RN, BSN Entered By: Mikeal Hawthorne on 05/18/2020 14:29:52 -------------------------------------------------------------------------------- Multi Wound Chart Details Patient Name: Date of Service: Judith Boone. 05/18/2020 2:30 PM Medical Record Number: [id number] Patient Account Number: [id number] Date of Birth/Sex: Treating RN: December 25, 1931 (87 y.o. [name] Primary Care Jamarea Selner: Velna Hatchet Other Clinician: Referring Cybill Uriegas: Treating Conlin Brahm/Extender: Wendall Mola in Treatment: 7 Vital Signs Height(in): 60 Pulse(bpm): 5 Weight(lbs): 138 Blood Pressure(mmHg): 174/69 Body  Mass Index(BMI): 27 Temperature(F): 98.4 Respiratory Rate(breaths/min): 17 Photos: [6:No Photos Left, Posterior Lower Leg] [N/A:N/A N/A] Wound Location: [6:Trauma] [N/A:N/A] Wounding Event: [6:Inflammatory] [N/A:N/A] Primary Etiology: [6:12/28/2019] [N/A:N/A] Date Acquired: [6:7] [N/A:N/A] Weeks of Treatment: [6:Healed - Epithelialized] [N/A:N/A] Wound Status: [6:0x0x0] [N/A:N/A] Measurements L x W x D (cm) [6:0] [N/A:N/A] A (cm) : rea [6:0] [N/A:N/A] Volume (cm) : [6:100.00%] [N/A:N/A] % Reduction in A rea: [6:100.00%] [N/A:N/A] % Reduction in Volume: [6:Full Thickness  Without Exposed] [N/A:N/A] Classification: [6:Support Structures] Treatment Notes Wound #6 (Lower Leg) Wound Laterality: Left, Posterior Cleanser Peri-Wound Care Topical Primary Dressing Secondary Dressing Secured With Compression Wrap Compression Stockings Add-Ons Notes foam border applied Electronic Signature(s) Signed: 05/18/2020 4:10:40 PM By: Linton Ham MD Signed: 05/18/2020 4:31:51 PM By: Baruch Gouty RN, BSN Entered By: Linton Ham on 05/18/2020 15:49:54 -------------------------------------------------------------------------------- Multi-Disciplinary Care Plan Details Patient Name: Date of Service: Judith Boone. 05/18/2020 2:30 PM Medical Record Number: [id number] Patient Account Number: [id number] Date of Birth/Sex: Treating RN: [birth year] (87 y.o. [name] Primary Care Janet Humphreys: Velna Hatchet Other Clinician: Referring Saachi Zale: Treating Mattilyn Crites/Extender: Wendall Mola in Treatment: 7 Active Inactive Electronic Signature(s) Signed: 05/18/2020 4:31:51 PM By: Baruch Gouty RN, BSN Entered By: Baruch Gouty on 05/18/2020 14:53:40 -------------------------------------------------------------------------------- Pain Assessment Details Patient Name: Date of Service: Judith Boone. 05/18/2020 2:30 PM Medical Record Number:  [id number] Patient Account Number: [id number] Date of Birth/Sex: Treating RN: Mar 29, 1932 (87 y.o. [name] Primary Care Evynn Boutelle: Velna Hatchet Other Clinician: Referring Maybel Dambrosio: Treating Weiland Tomich/Extender: Wendall Mola in Treatment: 7 Active Problems Location of Pain Severity and Description of Pain Patient Has Paino No Site Locations With Dressing Change: No Pain Management and Medication Current Pain Management: Electronic Signature(s) Signed: 05/18/2020 3:06:23 PM By: Mikeal Hawthorne EMT/HBOT/SD Signed: 05/18/2020 4:31:51 PM By: Baruch Gouty RN, BSN Entered By: Mikeal Hawthorne on 05/18/2020 14:28:57 -------------------------------------------------------------------------------- Patient/Caregiver Education Details Patient Name: Date of Service: Judith Boone 1/21/2022andnbsp2:30 PM Medical Record Number: [id number] Patient Account Number: [id number] Date of Birth/Gender: Treating RN: Sep 22, 1931 (87 y.o. [name] Primary Care Physician: Velna Hatchet Other Clinician: Referring Physician: Treating Physician/Extender: Wendall Mola in Treatment: 7 Education Assessment Education Provided To: Patient Education Topics Provided Venous: Methods: Explain/Verbal Responses: Reinforcements needed, State content correctly Wound/Skin Impairment: Methods: Explain/Verbal Responses: Reinforcements needed, State content correctly Electronic Signature(s) Signed: 05/18/2020 4:31:51 PM By: Baruch Gouty RN, BSN Entered By: Baruch Gouty on 05/18/2020 14:51:55 -------------------------------------------------------------------------------- Wound Assessment Details Patient Name: Date of Service: Judith Boone. 05/18/2020 2:30 PM Medical Record Number: [id number] Patient Account Number: [id number] Date of Birth/Sex: Treating RN: Jan 17, 1932 (87 y.o. [name] Primary Care Nyasiah Moffet: Velna Hatchet Other Clinician: Referring Arthor Gorter: Treating Dorean Hiebert/Extender: Wendall Mola in Treatment: 7 Wound Status Wound Number: 6 Primary Etiology: Inflammatory Wound Location: Left, Posterior Lower Leg Wound Status: Healed - Epithelialized Wounding Event: Trauma Date Acquired: 12/28/2019 Weeks Of Treatment: 7 Clustered Wound: No Wound Measurements Length: (cm) Width: (cm) Depth: (cm) Area: (cm) Volume: (cm) 0 % Reduction in Area: 100% 0 % Reduction in Volume: 100% 0 0 0 Wound Description Classification: Full Thickness Without Exposed Support Structur es Treatment Notes Wound #6 (Lower Leg) Wound Laterality: Left, Posterior Cleanser Peri-Wound Care Topical Primary Dressing Secondary Dressing Secured With Compression Wrap Compression Stockings Add-Ons Notes foam border applied Electronic Signature(s) Signed: 05/18/2020 3:06:23 PM By: Mikeal Hawthorne EMT/HBOT/SD Signed: 05/18/2020 4:31:51 PM By: Baruch Gouty RN, BSN Entered By: Mikeal Hawthorne on 05/18/2020 14:30:08 -------------------------------------------------------------------------------- Vitals Details Patient Name: Date of Service: Judith Boone. 05/18/2020 2:30 PM Medical Record Number: [id number] Patient Account Number: [id number] Date of Birth/Sex: Treating RN: [birth year] (87 y.o. [name] Primary Care Kanisha Duba: Velna Hatchet Other Clinician: Referring Verlean Allport: Treating Vincen Bejar/Extender: Wendall Mola in Treatment: 7 Vital Signs Time Taken: 14:25 Temperature (F): 98.4 Height (in): 60 Pulse (bpm): 86 Weight (lbs): 138 Respiratory Rate (breaths/min):  17 Body Mass Index (BMI): 26.9 Blood Pressure (mmHg): 174/69 Reference Range: 80 - 120 mg / dl Electronic Signature(s) Signed: 05/18/2020 3:06:23 PM By: Mikeal Hawthorne EMT/HBOT/SD Entered By: Mikeal Hawthorne on 05/18/2020 14:28:49

## 2020-05-18 NOTE — Progress Notes (Signed)
Judith Boone, Judith Boone ([id number]) Visit Report for 05/18/2020 HPI Details Patient Name: Date of Service: [name] 05/18/2020 2:30 PM Medical Record Number: [id number] Patient Account Number: [id number] Date of Birth/Sex: Treating RN: [birth year] (87 y.o. [name] Primary Care Provider: Velna Hatchet Other Clinician: Referring Provider: Treating Provider/Extender: Wendall Mola in Treatment: 7 History of Present Illness Location: left anterior shin to wounds about 2 months ago and on the left lateral calf 1 wound about 1 week ago Quality: Patient reports experiencing a dull pain to affected area(s). Severity: Patient states wound(s) are getting worse. Duration: Patient has had the wound for > 2 months prior to seeking treatment at the wound center Timing: Pain in wound is Intermittent (comes and goes Context: The wound occurred when the patient had a blunt object hit her anterior shin and she tore her skin on the left lateral calf while she was wearing her trousers Modifying Factors: Patient wound(s)/ulcer(s) are worsening due to :swelling and redness and discharge ssociated Signs and Symptoms: Patient reports having increase swelling. A HPI Description: 87 year old patient was seen at her PCPs office at [place] for her left shin injury about a month ago. was seen in the ER for redness and drainage. She was started on Keflex 4 times a day and was then seen in follow-up in the office. Past medical history for GERD, right sciatica, COPD, DVT left lower extremity in May 2016. Also known to have a left femoral artery occlusion with collaterals and was seen in May 2016 by [facility]. He is status post bilateral knee arthroplasty, appendectomy, TAH/BSO in 2000. The patient was seen again in follow-up on 12/12/2016 with difficulty to heal wound due to peripheral artery disease and venous disease and there started on doxycycline 100 mg twice a day for  10 days and referred her to the wound center for an opinion. the patient has had a right lower extremity venous Doppler ultrasound and no evidence of DVT was found in the right lower extremity in July 2018. in May 2016 she had a lower extremity venous duplex evaluation done and there was deep venous reflux observed throughout the left lower extremity but there was no saphenous reflux. There was also focal deep vein thrombosis in one of the paired posterior tibial veins in the left calf. lower arterial study done in September 2015 showed right ABI of 1.08 and the left ABI of 0.87 with right toe brachial index of 0.71 in left toe brachial index of 0.87. 12/31/2016 -- had a lower extremity venous duplex reflux evaluation done which shows no evidence of bilateral lower extremity DVT There was reflux in . bilateral common femoral veins and left popliteal vein. Reflux in the right saphenofemoral junction and proximal great saphenous vein and in the great saphenous vein at the medial knee and proximal calf. Reflux in the right small saphenous vein midsegment. Reflux in the left saphenofemoral junction only. No reflux observed in the great saphenous vein and small saphenous vein. lower extremity arterial study done showed a left ABI -- 0.76 suggestive of moderate arterial occlusive disease and the right ABI -- 0.93 was within normal limits. toe brachial indices were 0.58 on the right and 0.50 on the left which were both abnormal. 01/07/2017 -- her PCP manage to get her an appointment to see Dr. Donnetta Hutching, tomorrow. Dr. Ardeth Perfect also said she should not be on any Lasix 01/14/2017 -- was seen by Dr. Althea Charon -- who reviewed the data  which showed she had reflux in the right leg in the common femoral vein and femoral vein odd and on the left side throughout the common femoral vein and femoral vein and popliteal vein. Arterial studies revealed normal ABIs on the right and a reduced ABI on the left with biphasic  waveform. He did not find any correctable superficial reflux and she did have mild arterial insufficiency but felt that it was adequate flow for healing these venous stasis ulcers without revascularization attempts. If she had progressive tissue loss she could undergo arteriography. He would see her back as needed 05/05/17 READMISSION This is a patient who is seen here earlier in the year for a wound on the left anterior lower leg. She had reflux studies and arterial studies done at that time and she saw Dr. early. Dr. Tawni Millers did not feel she had correctable superficial reflux. As far as arterial studies were concerned she had a left ABI of 0.76 a right ABI of 0.93 a TBI on the left at 0.50 and on the right at 0.5 date. She was felt to have mild arterial disease but blood flow was felt to be adequate for healing. The patient had a fall on November 30 causing wounds on the right lateral lower leg and the right mid humeral level. She is simply been covering these although recently she was put on cephalexin by her primary physician. She is here for review of these wounds. She has been compliant with the compression stockings that were ordered during her last admission here. These are 20-30 mm 05/12/17; we put Santyl on her right leg wound and wrapped her and the wound actually looks quite a bit healthier today. There is been very significant reduction in the skin tear in her right mid humerus. We xeroform The patient requests not to have her leg wrap this week, she has 20-30 mm compression stockings and would like me to prescribe Santyl so she can change the dressing even though I clearly explained it is likely we'll be changing to silver collagen or Hydrofera Blue next week 05/19/17; patient is changing her own dressing using Santyl on the right leg and Xeroform on the arm the remaining small areas on the arm are looking epithelialized I don't think the Xeroform is necessary simply cover with a foam border.  Still using Santyl to the right leg. No change in dimensions 05/26/17; the patient's wound on her arm has closed over. Changed her to [place] last week from [place]. She still has a non viable surface today unfortunately 06/02/17; the patient's right arm wound remains closed over. Changed her to [facility] last week after a vigorous debridement of the right anterior lower leg wound. Surface looks better 06/09/17; the patient's right arm remains closed. She is been on Prisma now for 2 weeks with hydrogel. 06/16/17; traumatic wound on the right anterior leg. I switched 3 weeks ago to [facility] from [place] although we've made no real progress. Still a gritty surface. I changed her to Iodoflex today. She is still using her on 20-30 mm stockings 06/23/17; traumatic wound on the right anterior leg in the setting of chronic venous insufficiency. Changed her to Iodoflex last week because of continued need for surface debridement. She arrives today with a better-looking wound surface. She is using her own compression stockings 06/30/17; traumatic wound on the right anterior leg in the setting of chronic venous insufficiency. Much better-looking wound surface. Will change to [facility] today. She is using her own compression stockings  and the edema control looks adequate 07/07/17; traumatic wound on the right anterior leg in the setting of chronic venous insufficiency. Slightly smaller today using Hydrofera Blue 07/14/17-she is here for evaluation for right lower extremity ulcer. There is improvement in measurements and appearance. We will continue with same treatment plan and follow-up next week 07/23/17; right lower extremity ulcer. Continued improvement in measurements in appearance. Under the bright light there is still some surface slough although I elected not to debrided this today as the dimensions are significantly improved. We use Hydrofera Blue 08/04/17; right lower extremity traumatic ulcer in the setting of chronic  venous insufficiency. Somewhat surprisingly she is healed today. Totally epithelialized. She has 20-30 mm compression stockings READMISSION 03/30/2020 Judith Boone is a now an 87 year old woman who was in this clinic in 2018 and then for 3 months in 2019 at which time she had had a fall and had a wound on her right leg. She was discharged with 20/30 stockings. She tells me that her problem here began in September [place] rubbing the posterior left calf on the steps. She came home with a "large bruise". This eventually opened. She is on Eliquis. She was seen by her primary on 01/29/2020 at which time she had left leg swelling a DVT rule out was negative she was given doxycycline. There was no mention of an actual wound. At some point this area actually opened. She simply been applying a Band-Aid over it currently and presented with 4 small interconnecting wounds on the posterior calf however there was 0.6 cm in depth and undermining. Past medical history; chronic venous insufficiency, paroxysmal A. fib on Eliquis, COPD, lumbar spondylosis, hyperlipidemia, chronic renal failure stage III hypertension ABI in our clinic was 0.52. Her last noninvasive studies were in 2018. At that point on the left her ABI was 0.76 her TBI was 0.50. She had a extensive evaluation of her vascular status in September 2018 by Dr. Darene Lamer early. It was generally felt that she had enough blood flow to heal the predominantly venous odd ulcers on her left lower leg. He was also noted that she had very significant venous hypertension related to deep venous reflux. At that point she did not have any correctable superficial reflux. Dr. Donnetta Hutching assessed her dorsalis pedis and posterior tibial with a hand-held Doppler and he felt that her flow was good 12/10; patient I admitted to the clinic last week. She had the remanence of a complex hematoma. Interconnecting wounds on the posterior  left calf. I removed the connecting skin to fully expose the wound. 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 We have been using Iodoflex she has edema in her legs she is very reluctant to continue the compression and really decided against that today. I am continuing the Iodoflex with border foam and using her own 20/30 mm stocking 12/27; some improvement in the wound surface. We have been using Iodoflex the patient did not want a wrap and therefore we have been using her own 20/30 stockings. She also has known PAD with a ABI in our clinic of 0.52. We may need to send her back to see vein and vascular [previously Dr. Early] if we can get this to close. 1/7; using Iodoflex on this initially traumatic wound on the left posterior calf. This was initially almost certainly the result of the  hematoma. We managed to get this to fill-in. It is epithelializing slowly. She probably also has chronic arterial and venous insufficiency 1/14 we been using Iodoflex on this initially traumatic wound on the left posterior calf she probably has chronic venous insufficiency and PAD but we've been getting this to close down without any vascular studies. She does not want to be wrapped today she has 20/30 below-knee compression stocking 1/21; the patient arrives in clinic today and her wound is actually closed however she has significant pitting edema in the posterior part of the left calf. This was no doubt where her stocking is rolled down causing a posterior upper venous occlusion issue. She has blisters on the posterior calf but no open wound on the left. Electronic Signature(s) Signed: 05/18/2020 4:10:40 PM By: Linton Ham MD Entered By: Linton Ham on 05/18/2020 15:52:27 -------------------------------------------------------------------------------- Physical Exam Details Patient Name: Date of Service: Judith Boone. 05/18/2020 2:30 PM Medical Record Number: [id number] Patient Account Number: [id number] Date of Birth/Sex: Treating RN: 07-Nov-1931 (87 y.o. [name] Primary Care Provider: Velna Hatchet Other Clinician: Referring Provider: Treating Provider/Extender: Wendall Mola in Treatment: 7 Constitutional Patient is hypertensive.. Pulse regular and within target range for patient.Marland Kitchen Respirations regular, non-labored and within target range.. Temperature is normal and within the target range for the patient.Marland Kitchen Appears in no distress. Notes Wound exam; left posterior calf. Everything is closed here she did have a small blister below where the wound was however this looks satisfactory as well. 2+ pitting edema in the posterior calf no evidence of a DVT or cellulitis. You can see where the stocking fell down causing posterior occlusion I have no doubt in my mind that this is the issue here. Electronic Signature(s) Signed: 05/18/2020 4:10:40 PM By: Linton Ham MD Entered By: Linton Ham on 05/18/2020 15:54:25 -------------------------------------------------------------------------------- Physician Orders Details Patient Name: Date of Service: Judith Boone. 05/18/2020 2:30 PM Medical Record Number: [id number] Patient Account Number: [id number] Date of Birth/Sex: Treating RN: [birth year] (87 y.o. [name] Primary Care Provider: Velna Hatchet Other Clinician: Referring Provider: Treating Provider/Extender: Wendall Mola in Treatment: 7 Verbal / Phone Orders: No Diagnosis Coding ICD-10 Coding Code Description S80.12XD Contusion of left lower leg, subsequent encounter L97.222 Non-pressure chronic ulcer of left calf with fat layer exposed I70.242 Atherosclerosis of native arteries of left leg with ulceration of calf Discharge From [facility] Discharge from [place] Edema Control - Lymphedema / SCD /  Other Bilateral Lower Extremities Elevate legs to the level of the heart or above for 30 minutes daily and/or when sitting, a frequency of: - throughout the day Avoid standing for long periods of time. Exercise regularly Moisturize legs daily. - right leg. Compression stocking or Garment 20-30 mm/Hg pressure to: - both legs daily. apply in the morning and remove at night Electronic Signature(s) Signed: 05/18/2020 4:10:40 PM By: Linton Ham MD Signed: 05/18/2020 4:31:51 PM By: Baruch Gouty RN, BSN Entered By: Baruch Gouty on 05/18/2020 15:11:27 -------------------------------------------------------------------------------- Problem List Details Patient Name: Date of Service: Judith Boone. 05/18/2020 2:30 PM Medical Record Number: [id number] Patient Account Number: [id number] Date of Birth/Sex: Treating RN: 09/02/1931 (87 y.o. [name] Primary Care Provider: Velna Hatchet Other Clinician: Referring Provider: Treating Provider/Extender: Wendall Mola in Treatment: 7 Active Problems ICD-10 Encounter Code Description Active Date MDM Diagnosis S80.12XD Contusion of left lower leg, subsequent encounter 03/30/2020 No Yes L97.222 Non-pressure chronic ulcer  of left calf with fat layer exposed 03/30/2020 No Yes I70.242 Atherosclerosis of native arteries of left leg with ulceration of calf 03/30/2020 No Yes Inactive Problems Resolved Problems Electronic Signature(s) Signed: 05/18/2020 4:10:40 PM By: Linton Ham MD Signed: 05/18/2020 4:10:40 PM By: Linton Ham MD Entered By: Linton Ham on 05/18/2020 15:49:45 -------------------------------------------------------------------------------- Progress Note Details Patient Name: Date of Service: Judith Boone. 05/18/2020 2:30 PM Medical Record Number: [id number] Patient Account Number: [id number] Date of Birth/Sex: Treating RN: [birth year] (87 y.o. [name] Primary Care Provider:  Velna Hatchet Other Clinician: Referring Provider: Treating Provider/Extender: Wendall Mola in Treatment: 7 Subjective History of Present Illness (HPI) The following HPI elements were documented for the patient's wound: Location: left anterior shin to wounds about 2 months ago and on the left lateral calf 1 wound about 1 week ago Quality: Patient reports experiencing a dull pain to affected area(s). Severity: Patient states wound(s) are getting worse. Duration: Patient has had the wound for > 2 months prior to seeking treatment at the wound center Timing: Pain in wound is Intermittent (comes and goes Context: The wound occurred when the patient had a blunt object hit her anterior shin and she tore her skin on the left lateral calf while she was wearing her trousers Modifying Factors: Patient wound(s)/ulcer(s) are worsening due to :swelling and redness and discharge Associated Signs and Symptoms: Patient reports having increase swelling. 87 year old patient was seen at her PCPs office at [place] for her left shin injury about a month ago. was seen in the ER for redness and drainage. She was started on Keflex 4 times a day and was then seen in follow-up in the office. Past medical history for GERD, right sciatica, COPD, DVT left lower extremity in May 2016. Also known to have a left femoral artery occlusion with collaterals and was seen in May 2016 by [facility]. He is status post bilateral knee arthroplasty, appendectomy, TAH/BSO in 2000. The patient was seen again in follow-up on 12/12/2016 with difficulty to heal wound due to peripheral artery disease and venous disease and there started on doxycycline 100 mg twice a day for 10 days and referred her to the wound center for an opinion. the patient has had a right lower extremity venous Doppler ultrasound and no evidence of DVT was found in the right lower extremity in July 2018. in May 2016 she had a  lower extremity venous duplex evaluation done and there was deep venous reflux observed throughout the left lower extremity but there was no saphenous reflux. There was also focal deep vein thrombosis in one of the paired posterior tibial veins in the left calf. lower arterial study done in September 2015 showed right ABI of 1.08 and the left ABI of 0.87 with right toe brachial index of 0.71 in left toe brachial index of 0.87. 12/31/2016 -- had a lower extremity venous duplex reflux evaluation done which shows no evidence of bilateral lower extremity DVT There was reflux in . bilateral common femoral veins and left popliteal vein. Reflux in the right saphenofemoral junction and proximal great saphenous vein and in the great saphenous vein at the medial knee and proximal calf. Reflux in the right small saphenous vein midsegment. Reflux in the left saphenofemoral junction only. No reflux observed in the great saphenous vein and small saphenous vein. lower extremity arterial study done showed a left ABI -- 0.76 suggestive of moderate arterial occlusive disease and the right ABI -- 0.93 was within  normal limits. toe brachial indices were 0.58 on the right and 0.50 on the left which were both abnormal. 01/07/2017 -- her PCP manage to get her an appointment to see Dr. Donnetta Hutching, tomorrow. Dr. Ardeth Perfect also said she should not be on any Lasix 01/14/2017 -- was seen by Dr. Althea Charon -- who reviewed the data which showed she had reflux in the right leg in the common femoral vein and femoral vein odd and on the left side throughout the common femoral vein and femoral vein and popliteal vein. Arterial studies revealed normal ABIs on the right and a reduced ABI on the left with biphasic waveform. He did not find any correctable superficial reflux and she did have mild arterial insufficiency but felt that it was adequate flow for healing these venous stasis ulcers without revascularization attempts. If she had  progressive tissue loss she could undergo arteriography. He would see her back as needed 05/05/17 READMISSION This is a patient who is seen here earlier in the year for a wound on the left anterior lower leg. She had reflux studies and arterial studies done at that time and she saw Dr. early. Dr. Tawni Millers did not feel she had correctable superficial reflux. As far as arterial studies were concerned she had a left ABI of 0.76 a right ABI of 0.93 a TBI on the left at 0.50 and on the right at 0.5 date. She was felt to have mild arterial disease but blood flow was felt to be adequate for healing. The patient had a fall on November 30 causing wounds on the right lateral lower leg and the right mid humeral level. She is simply been covering these although recently she was put on cephalexin by her primary physician. She is here for review of these wounds. She has been compliant with the compression stockings that were ordered during her last admission here. These are 20-30 mm 05/12/17; we put Santyl on her right leg wound and wrapped her and the wound actually looks quite a bit healthier today. There is been very significant reduction in the skin tear in her right mid humerus. We xeroform The patient requests not to have her leg wrap this week, she has 20-30 mm compression stockings and would like me to prescribe Santyl so she can change the dressing even though I clearly explained it is likely we'll be changing to silver collagen or Hydrofera Blue next week 05/19/17; patient is changing her own dressing using Santyl on the right leg and Xeroform on the arm the remaining small areas on the arm are looking epithelialized I don't think the Xeroform is necessary simply cover with a foam border. Still using Santyl to the right leg. No change in dimensions 05/26/17; the patient's wound on her arm has closed over. Changed her to [place] last week from [place] She still has a non viable surface today unfortunately 06/02/17;  the patient's right arm wound remains closed over. Changed her to [facility] last week after a vigorous debridement of the right anterior lower leg wound. Surface looks better 06/09/17; the patient's right arm remains closed. She is been on Prisma now for 2 weeks with hydrogel. 06/16/17; traumatic wound on the right anterior leg. I switched 3 weeks ago to [facility] from [place] although we've made no real progress. Still a gritty surface. I changed her to Iodoflex today. She is still using her on 20-30 mm stockings 06/23/17; traumatic wound on the right anterior leg in the setting of chronic venous insufficiency. Changed her  to Iodoflex last week because of continued need for surface debridement. She arrives today with a better-looking wound surface. She is using her own compression stockings 06/30/17; traumatic wound on the right anterior leg in the setting of chronic venous insufficiency. Much better-looking wound surface. Will change to [facility] today. She is using her own compression stockings and the edema control looks adequate 07/07/17; traumatic wound on the right anterior leg in the setting of chronic venous insufficiency. Slightly smaller today using Hydrofera Blue 07/14/17-she is here for evaluation for right lower extremity ulcer. There is improvement in measurements and appearance. We will continue with same treatment plan and follow-up next week 07/23/17; right lower extremity ulcer. Continued improvement in measurements in appearance. Under the bright light there is still some surface slough although I elected not to debrided this today as the dimensions are significantly improved. We use Hydrofera Blue 08/04/17; right lower extremity traumatic ulcer in the setting of chronic venous insufficiency. Somewhat surprisingly she is healed today. Totally epithelialized. She has 20-30 mm compression stockings READMISSION 03/30/2020 Judith Boone is a now an 87 year old woman who was in this clinic in 2018  and then for 3 months in 2019 at which time she had had a fall and had a wound on her right leg. She was discharged with 20/30 stockings. She tells me that her problem here began in September [place] rubbing the posterior left calf on the steps. She came home with a "large bruise". This eventually opened. She is on Eliquis. She was seen by her primary on 01/29/2020 at which time she had left leg swelling a DVT rule out was negative she was given doxycycline. There was no mention of an actual wound. At some point this area actually opened. She simply been applying a Band-Aid over it currently and presented with 4 small interconnecting wounds on the posterior calf however there was 0.6 cm in depth and undermining. Past medical history; chronic venous insufficiency, paroxysmal A. fib on Eliquis, COPD, lumbar spondylosis, hyperlipidemia, chronic renal failure stage III hypertension ABI in our clinic was 0.52. Her last noninvasive studies were in 2018. At that point on the left her ABI was 0.76 her TBI was 0.50. She had a extensive evaluation of her vascular status in September 2018 by Dr. Darene Lamer early. It was generally felt that she had enough blood flow to heal the predominantly venous odd ulcers on her left lower leg. He was also noted that she had very significant venous hypertension related to deep venous reflux. At that point she did not have any correctable superficial reflux. Dr. Donnetta Hutching assessed her dorsalis pedis and posterior tibial with a hand-held Doppler and he felt that her flow was good 12/10; patient I admitted to the clinic last week. She had the remanence of a complex hematoma. Interconnecting wounds on the posterior left calf. I removed the connecting skin to fully expose the wound. 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 We have been using  Iodoflex she has edema in her legs she is very reluctant to continue the compression and really decided against that today. I am continuing the Iodoflex with border foam and using her own 20/30 mm stocking 12/27; some improvement in the wound surface. We have been using Iodoflex the patient did not want a wrap and therefore we have been using her own 20/30  stockings. She also has known PAD with a ABI in our clinic of 0.52. We may need to send her back to see vein and vascular [previously Dr. Early] if we can get this to close. 1/7; using Iodoflex on this initially traumatic wound on the left posterior calf. This was initially almost certainly the result of the hematoma. We managed to get this to fill-in. It is epithelializing slowly. She probably also has chronic arterial and venous insufficiency 1/14 we been using Iodoflex on this initially traumatic wound on the left posterior calf she probably has chronic venous insufficiency and PAD but we've been getting this to close down without any vascular studies. She does not want to be wrapped today she has 20/30 below-knee compression stocking 1/21; the patient arrives in clinic today and her wound is actually closed however she has significant pitting edema in the posterior part of the left calf. This was no doubt where her stocking is rolled down causing a posterior upper venous occlusion issue. She has blisters on the posterior calf but no open wound on the left. Objective Constitutional Patient is hypertensive.. Pulse regular and within target range for patient.Marland Kitchen Respirations regular, non-labored and within target range.. Temperature is normal and within the target range for the patient.Marland Kitchen Appears in no distress. Vitals Time Taken: 2:25 PM, Height: 60 in, Weight: 138 lbs, BMI: 26.9, Temperature: 98.4 F, Pulse: 86 bpm, Respiratory Rate: 17 breaths/min, Blood Pressure: 174/69 mmHg. General Notes: Wound exam; left posterior calf. Everything is closed  here she did have a small blister below where the wound was however this looks satisfactory as well. 2+ pitting edema in the posterior calf no evidence of a DVT or cellulitis. You can see where the stocking fell down causing posterior occlusion I have no doubt in my mind that this is the issue here. Integumentary (Hair, Skin) Wound #6 status is Healed - Epithelialized. Original cause of wound was Trauma. The wound is located on the Left,Posterior Lower Leg. The wound measures 0cm length x 0cm width x 0cm depth; 0cm^2 area and 0cm^3 volume. 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 Exercise regularly Moisturize legs daily. - right leg. Compression stocking or Garment 20-30 mm/Hg pressure to: - both legs daily. apply in the morning and remove at night 1. The patient has nothing open on the left calf however she did have problems caused by her stocking slipping down. 2 I did give her the option of another week of 3 layer compression while we research other options with regards to the stockings however she tells me she has other stockings and that she will try 1 of these 3. The patient is at a significant risk of a quick turnaround back to the clinic however she wants to try her own stockings. 4. The patient has arterial and venous issues however I we managed to get these to close without further investigating these. She expresses that she is aware of both of these issues. 5. The patient can be discharged from the clinic Electronic  Signature(s) Signed: 05/18/2020 4:10:40 PM By: Dellia Nims,  Legrand Como MD Entered By: Linton Ham on 05/18/2020 15:56:38 -------------------------------------------------------------------------------- SuperBill Details Patient Name: Date of Service: Judith Boone 05/18/2020 Medical Record Number: [id number] Patient Account Number: [id number] Date of Birth/Sex: Treating RN: [birth year] (87 y.o. [name] Primary Care Provider: Velna Hatchet Other Clinician: Referring Provider: Treating Provider/Extender: Wendall Mola in Treatment: 7 Diagnosis Coding ICD-10 Codes Code Description S80.12XD Contusion of left lower leg, subsequent encounter L97.222 Non-pressure chronic ulcer of left calf with fat layer exposed I70.242 Atherosclerosis of native arteries of left leg with ulceration of calf Facility Procedures CPT4 Code: 23300762 Description: 26333 - WOUND CARE VISIT-LEV 3 EST PT Modifier: Quantity: 1 Physician Procedures : CPT4 Code Description Modifier [id number] - WC PHYS LEVEL 3 - EST PT ICD-10 Diagnosis Description S80.12XD Contusion of left lower leg, subsequent encounter L97.222 Non-pressure chronic ulcer of left calf with fat layer exposed I70.242  Atherosclerosis of native arteries of left leg with ulceration of calf Quantity: 1 Electronic Signature(s) Signed: 05/18/2020 4:10:40 PM By: Linton Ham MD Entered By: Linton Ham on 05/18/2020 15:56:47

## 2020-06-16 IMAGING — RF DG UGI W SINGLE CM
11 of 13 series · 14 of 24 positions shown · non-contrast
Comparison: None.

CLINICAL DATA: Pill dysphagia.

EXAM:
UPPER GI SERIES WITH KUB
TECHNIQUE: After obtaining a scout radiograph a routine upper GI series was
performed using thin barium
FLUOROSCOPY TIME:  Fluoroscopy Time:  2 minutes 48 seconds.
Radiation Exposure Index (if provided by the fluoroscopic device):
358 mGy
Number of Acquired Spot Images:

[Series 1: one shot · 0.14mm/px · 1 of 2 slices shown]
[im 1/2]
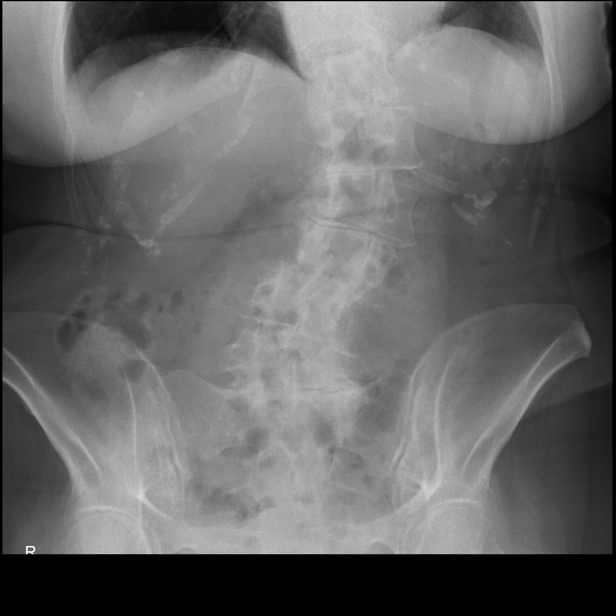

[Series 3: sequence · 0.29mm/px · 2 acquisitions, 1 frame shown (1 of 10)]
[im 1/2]
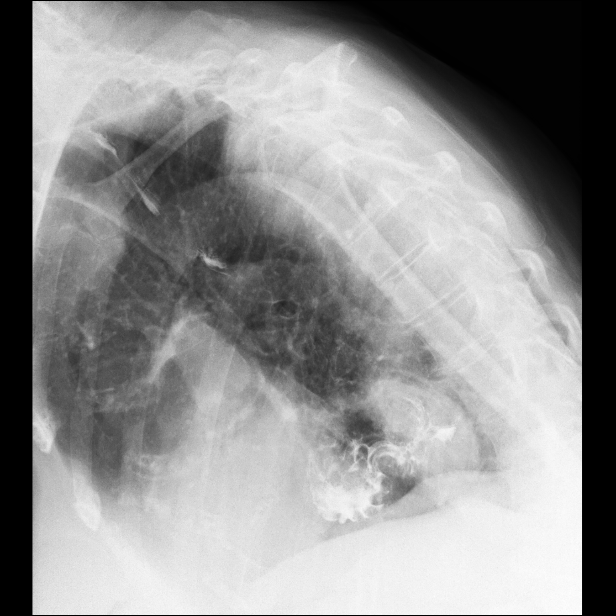

[Series 4: sequence · 0.29mm/px · 2 acquisitions, 1 frame shown (2 of 10)]
[im 1/2]
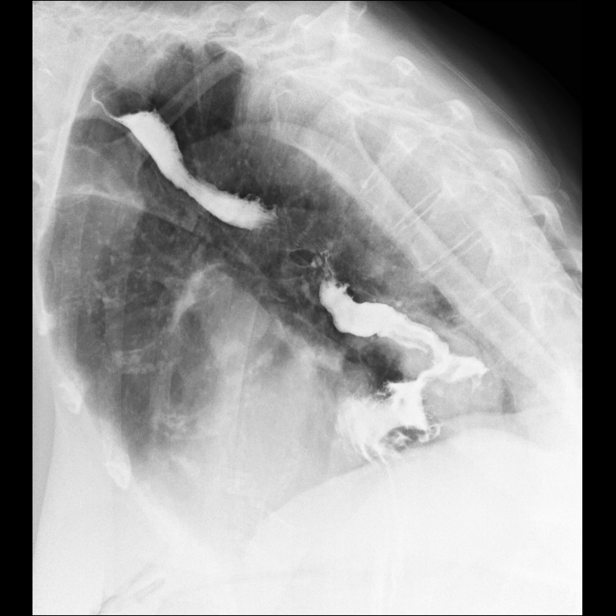

[Series 5: sequence · 0.29mm/px · 2 acquisitions, 1 frame shown (3 of 10)]
[im 1/2]
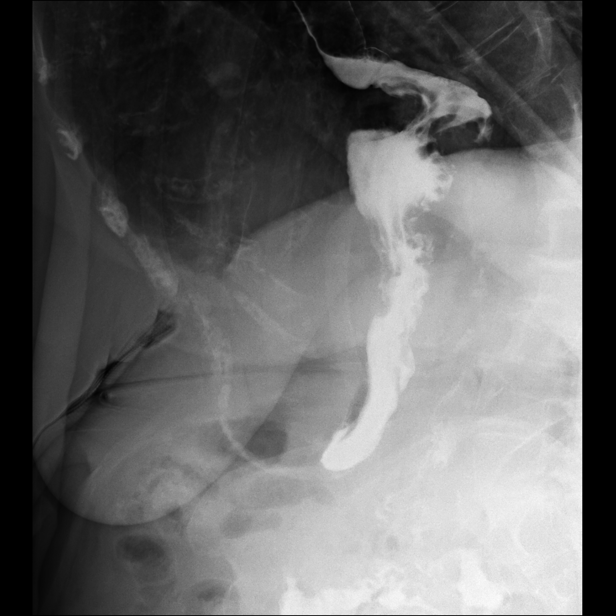

[Series 6: sequence · 0.29mm/px · 1 of 2 slices shown (4 of 10)]
[im 1/2]
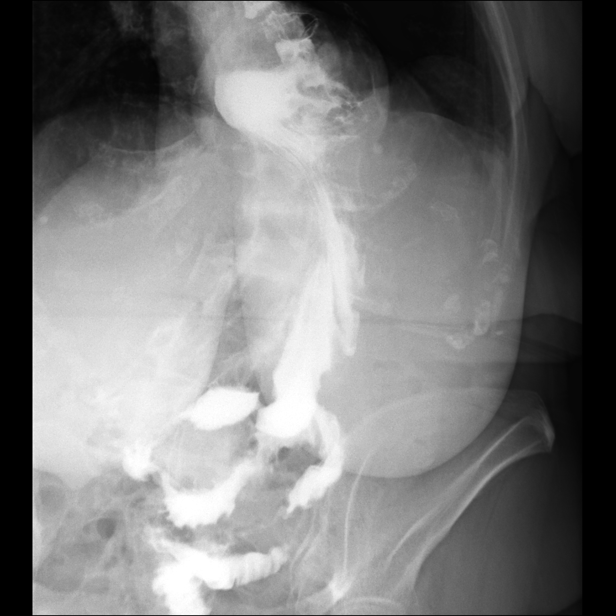

[Series 8: sequence · 0.29mm/px · 1 of 2 slices shown (5 of 10)]
[im 2/2]
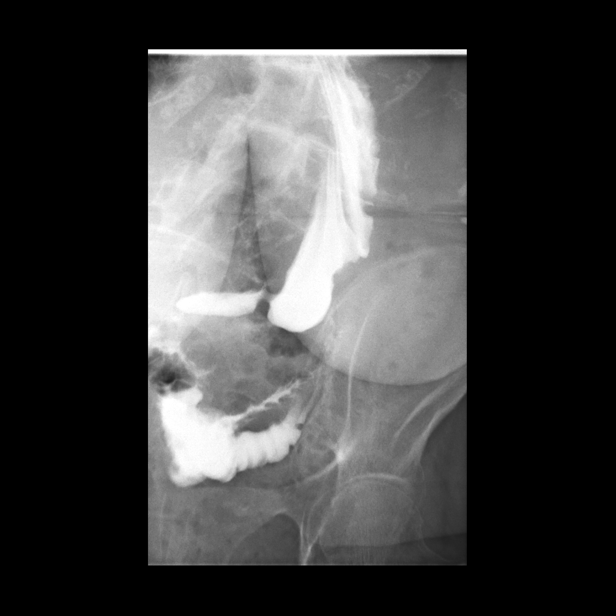

[Series 10: sequence · 0.29mm/px · 2 acquisitions, 1 frame shown (6 of 10)]
[im 2/2]
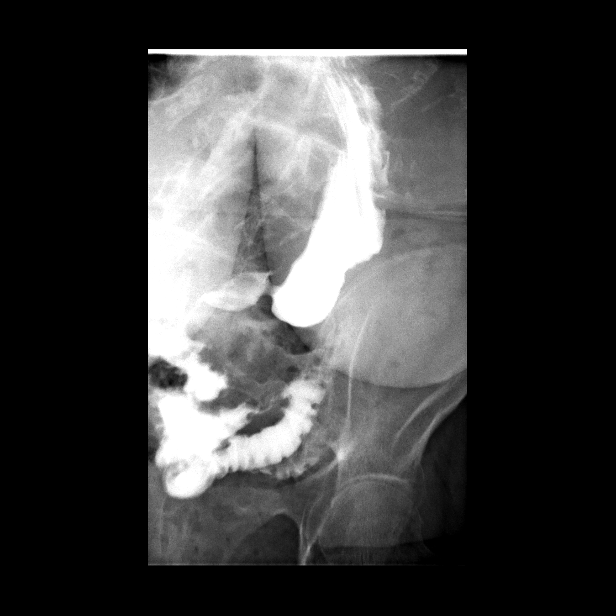

[Series 11: sequence · 0.29mm/px · 2 acquisitions, 1 frame shown (7 of 10)]
[im 1/2]
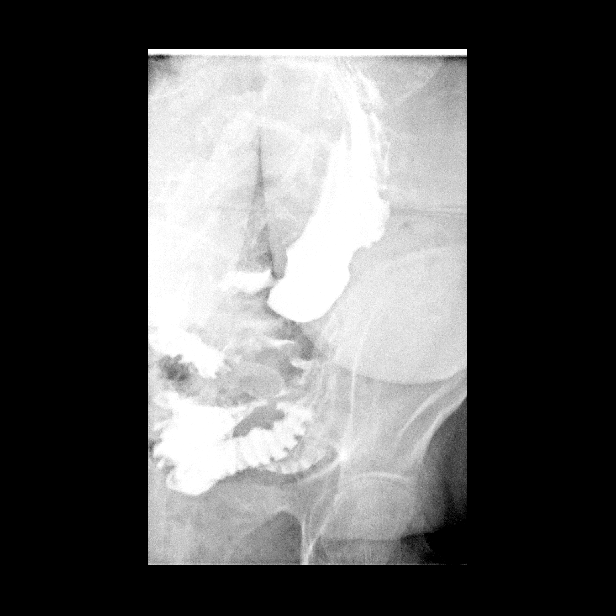

[Series 13: sequence · 2 acquisitions, 2 frames shown (8 of 10)]
[im 1/2]
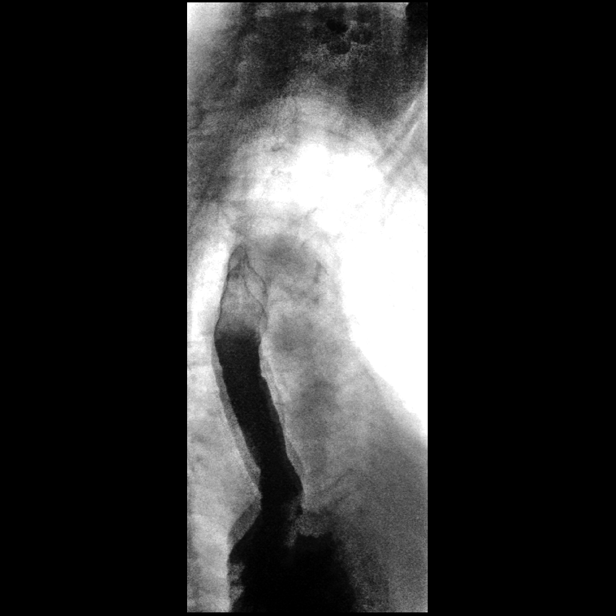
[im 2/2]
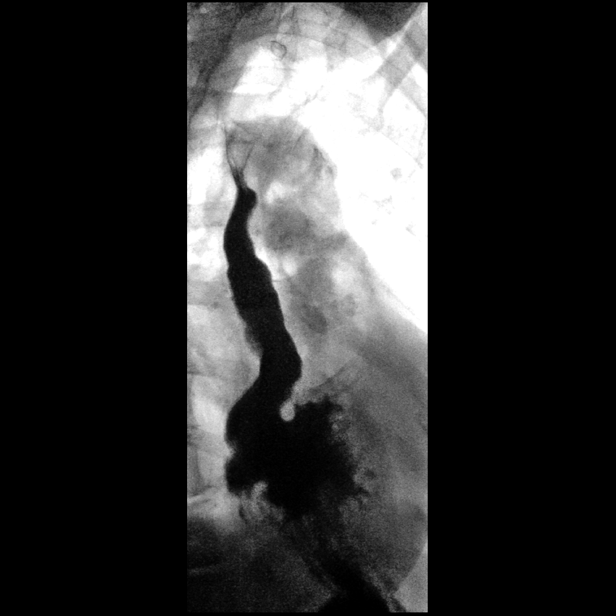

[Series 14: sequence · 2 acquisitions, 2 frames shown (9 of 10)]
[im 1/2]
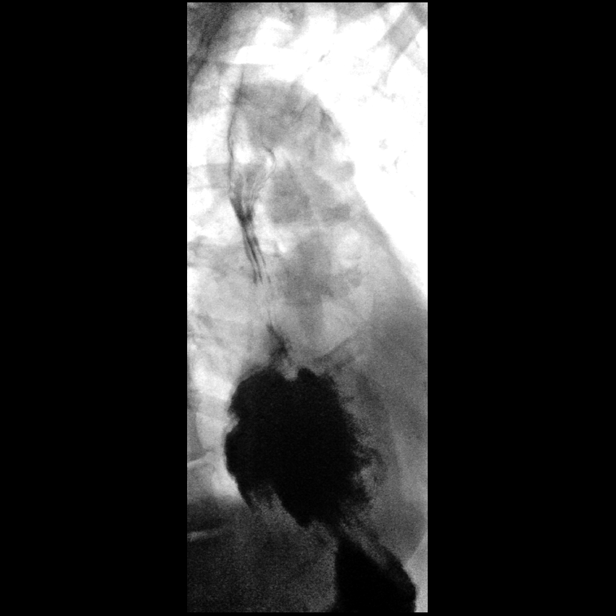
[im 2/2]
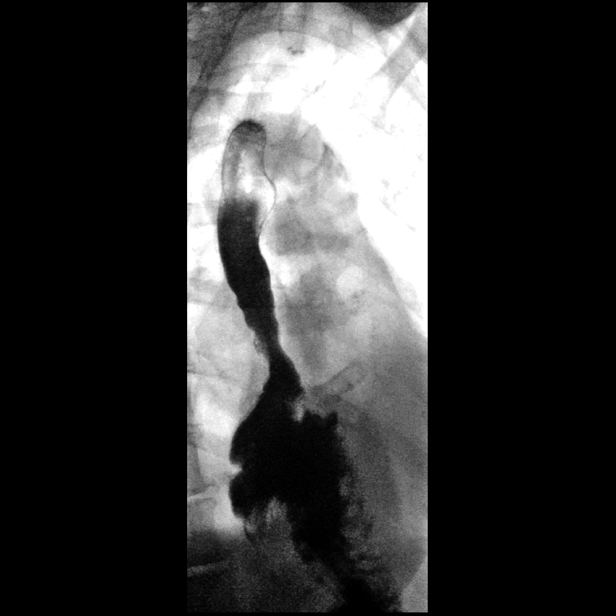

[Series 15: sequence · 2 acquisitions, 2 frames shown (10 of 10)]
[im 1/2]
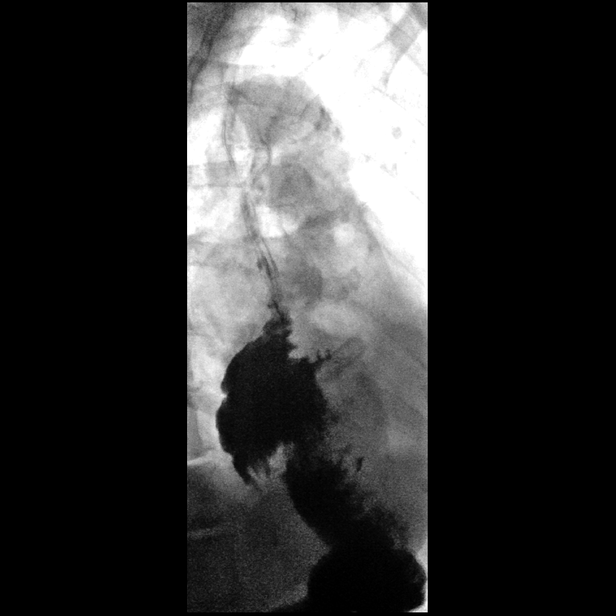
[im 2/2]
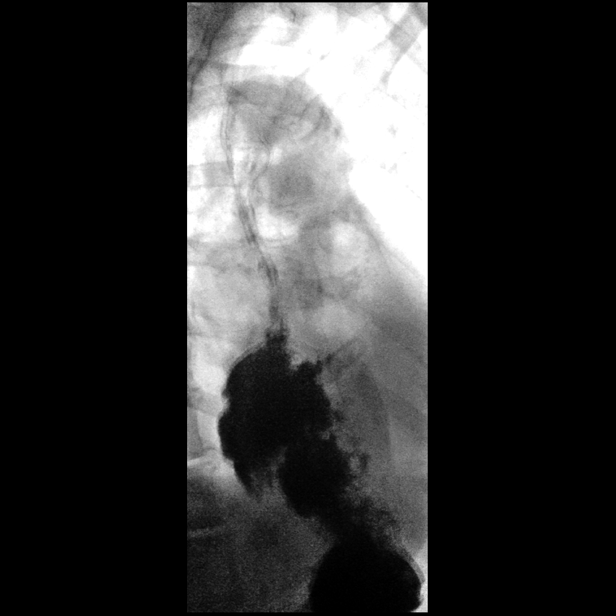

[14 of 24 positions shown; findings below may reference images not displayed]

FINDINGS: Limited study due to patient kyphosis and decreased mobility.

Upright imaging of the esophagus while drinking thin barium reveals
tortuous esophageal anatomy. Patient has a moderate hiatal hernia
with nearly 50% of the stomach contained in the chest. There is
evidence of a Schatzki ring at the esophagogastric junction. No
gross esophageal stricture or mass lesion evident. No esophageal
diverticulum.

As noted above, proximally 50% of the stomach is contained in the
chest. No evidence for gastric obstruction. Gastric emptying is
prompt. Pylorus and duodenal bulb appear normal. The duodenal C-loop
and ligament of Treitz are normally positioned.

Patient was able to tolerate LPO positioning relative to the fluoro
table for assessment of esophageal motility. Relatively good
preservation of primary peristalsis is observed although tertiary
contractions are noted in the distal esophagus.

Patient was unable to swallow a 13 mm barium tablet with water.
IMPRESSION: 1. Moderate hiatal hernia with mild Schatzki ring at the
esophagogastric junction. Approximately 50% of the stomach is
contained in the chest.
2. Relatively good preservation of primary peristalsis although
tertiary contractions are noted in the distal esophagus.
3. No evidence for aspiration during the study.
4. Patient was unable to swallow a 13 mm barium tablet.

## 2020-07-11 DIAGNOSIS — H35453 Secondary pigmentary degeneration, bilateral: Secondary | ICD-10-CM | POA: Diagnosis not present

## 2020-07-11 DIAGNOSIS — H35363 Drusen (degenerative) of macula, bilateral: Secondary | ICD-10-CM | POA: Diagnosis not present

## 2020-07-11 DIAGNOSIS — H353211 Exudative age-related macular degeneration, right eye, with active choroidal neovascularization: Secondary | ICD-10-CM | POA: Diagnosis not present

## 2020-07-11 DIAGNOSIS — H3561 Retinal hemorrhage, right eye: Secondary | ICD-10-CM | POA: Diagnosis not present

## 2020-07-11 DIAGNOSIS — H353121 Nonexudative age-related macular degeneration, left eye, early dry stage: Secondary | ICD-10-CM | POA: Diagnosis not present

## 2020-07-11 DIAGNOSIS — Z961 Presence of intraocular lens: Secondary | ICD-10-CM | POA: Diagnosis not present

## 2020-07-27 DIAGNOSIS — Z23 Encounter for immunization: Secondary | ICD-10-CM | POA: Diagnosis not present

## 2020-08-21 DIAGNOSIS — N1832 Chronic kidney disease, stage 3b: Secondary | ICD-10-CM | POA: Diagnosis not present

## 2020-08-21 DIAGNOSIS — I129 Hypertensive chronic kidney disease with stage 1 through stage 4 chronic kidney disease, or unspecified chronic kidney disease: Secondary | ICD-10-CM | POA: Diagnosis not present

## 2020-08-21 DIAGNOSIS — D692 Other nonthrombocytopenic purpura: Secondary | ICD-10-CM | POA: Diagnosis not present

## 2020-08-21 DIAGNOSIS — I4891 Unspecified atrial fibrillation: Secondary | ICD-10-CM | POA: Diagnosis not present

## 2020-08-21 DIAGNOSIS — R059 Cough, unspecified: Secondary | ICD-10-CM | POA: Diagnosis not present

## 2020-08-21 DIAGNOSIS — D6869 Other thrombophilia: Secondary | ICD-10-CM | POA: Diagnosis not present

## 2020-08-21 DIAGNOSIS — J449 Chronic obstructive pulmonary disease, unspecified: Secondary | ICD-10-CM | POA: Diagnosis not present

## 2020-09-17 DIAGNOSIS — H353122 Nonexudative age-related macular degeneration, left eye, intermediate dry stage: Secondary | ICD-10-CM | POA: Diagnosis not present

## 2020-09-17 DIAGNOSIS — H353211 Exudative age-related macular degeneration, right eye, with active choroidal neovascularization: Secondary | ICD-10-CM | POA: Diagnosis not present

## 2020-09-17 DIAGNOSIS — H35453 Secondary pigmentary degeneration, bilateral: Secondary | ICD-10-CM | POA: Diagnosis not present

## 2020-09-17 DIAGNOSIS — H35363 Drusen (degenerative) of macula, bilateral: Secondary | ICD-10-CM | POA: Diagnosis not present

## 2020-09-17 DIAGNOSIS — H3561 Retinal hemorrhage, right eye: Secondary | ICD-10-CM | POA: Diagnosis not present

## 2020-09-17 DIAGNOSIS — H35051 Retinal neovascularization, unspecified, right eye: Secondary | ICD-10-CM | POA: Diagnosis not present

## 2020-10-03 DIAGNOSIS — H353211 Exudative age-related macular degeneration, right eye, with active choroidal neovascularization: Secondary | ICD-10-CM | POA: Diagnosis not present

## 2020-10-03 DIAGNOSIS — H35051 Retinal neovascularization, unspecified, right eye: Secondary | ICD-10-CM | POA: Diagnosis not present

## 2020-10-10 ENCOUNTER — Ambulatory Visit (HOSPITAL_COMMUNITY): Payer: Medicare Other | Admitting: Physician Assistant

## 2020-10-10 DIAGNOSIS — D692 Other nonthrombocytopenic purpura: Secondary | ICD-10-CM | POA: Diagnosis not present

## 2020-10-10 DIAGNOSIS — D1801 Hemangioma of skin and subcutaneous tissue: Secondary | ICD-10-CM | POA: Diagnosis not present

## 2020-10-10 DIAGNOSIS — Z85828 Personal history of other malignant neoplasm of skin: Secondary | ICD-10-CM | POA: Diagnosis not present

## 2020-10-10 DIAGNOSIS — L570 Actinic keratosis: Secondary | ICD-10-CM | POA: Diagnosis not present

## 2020-10-16 ENCOUNTER — Other Ambulatory Visit: Payer: Self-pay

## 2020-10-16 ENCOUNTER — Ambulatory Visit (HOSPITAL_COMMUNITY)
Admission: RE | Admit: 2020-10-16 | Discharge: 2020-10-16 | Disposition: A | Discharge status: Home / Self Care | Payer: Medicare Other | Source: Ambulatory Visit | Attending: Physician Assistant | Admitting: Physician Assistant

## 2020-10-16 ENCOUNTER — Encounter (HOSPITAL_COMMUNITY): Payer: Self-pay | Admitting: Physician Assistant

## 2020-10-16 VITALS — BP 142/60 | HR 72 | Ht 60.0 in | Wt 132.0 lb

## 2020-10-16 DIAGNOSIS — Z87891 Personal history of nicotine dependence: Secondary | ICD-10-CM | POA: Diagnosis not present

## 2020-10-16 DIAGNOSIS — I471 Supraventricular tachycardia: Secondary | ICD-10-CM | POA: Insufficient documentation

## 2020-10-16 DIAGNOSIS — Z888 Allergy status to other drugs, medicaments and biological substances status: Secondary | ICD-10-CM | POA: Insufficient documentation

## 2020-10-16 DIAGNOSIS — Z7901 Long term (current) use of anticoagulants: Secondary | ICD-10-CM | POA: Diagnosis not present

## 2020-10-16 DIAGNOSIS — Z79899 Other long term (current) drug therapy: Secondary | ICD-10-CM | POA: Diagnosis not present

## 2020-10-16 DIAGNOSIS — E785 Hyperlipidemia, unspecified: Secondary | ICD-10-CM | POA: Diagnosis not present

## 2020-10-16 DIAGNOSIS — J449 Chronic obstructive pulmonary disease, unspecified: Secondary | ICD-10-CM | POA: Insufficient documentation

## 2020-10-16 DIAGNOSIS — D6869 Other thrombophilia: Secondary | ICD-10-CM | POA: Diagnosis not present

## 2020-10-16 DIAGNOSIS — Z8249 Family history of ischemic heart disease and other diseases of the circulatory system: Secondary | ICD-10-CM | POA: Insufficient documentation

## 2020-10-16 DIAGNOSIS — I10 Essential (primary) hypertension: Secondary | ICD-10-CM | POA: Insufficient documentation

## 2020-10-16 DIAGNOSIS — I480 Paroxysmal atrial fibrillation: Secondary | ICD-10-CM

## 2020-10-16 NOTE — Progress Notes (Signed)
Primary Care Physician: Velna Hatchet, MD Primary Cardiologist: none Primary Electrophysiologist: none Referring Physician: [facility]   Judith Boone is a 87 y.o. female with a history of COPD, HTN, HLD, PVD and paroxysmal atrial fibrillation who presents for follow up in the [facility]. The patient was initially diagnosed with atrial fibrillation 07/17/18 after presenting to the ER with elevated heart rates on her Apple Watch. She was asymptomatic during the event. She was started on Eliquis for a CHADS2VASC score 5 and metoprolol.  Zio patch 03/2019 showed no afib with brief epidodes of atrial tach and occasional PACs and PVCs.   On follow up today, patient reports that she has done well since her last visit. She will occasionally have some fatigue but denies any heart racing or palpitations. Her Apple Watch has not alerted her to any afib.   Today, she denies symptoms of palpitations, chest pain, shortness of breath, orthopnea, PND, dizziness, presyncope, syncope, snoring, daytime somnolence, bleeding, or neurologic sequela. The patient is tolerating medications without difficulties and is otherwise without complaint today.    Atrial Fibrillation Risk Factors:  she does not have symptoms or diagnosis of sleep apnea. she does not have a history of rheumatic fever. she does not have a history of alcohol use. The patient does not have a history of early familial atrial fibrillation or other arrhythmias.  she has a BMI of Body mass index is 25.78 kg/m.Marland Kitchen Filed Weights   10/16/20 1329  Weight: 59.9 kg     Family History  Problem Relation Age of Onset   Alcohol abuse Father    Heart disease Father    Hypertension Father    Hypertension Daughter    Hypertension Son    Asthma Son      Atrial Fibrillation Management history:  Previous antiarrhythmic drugs: none Previous cardioversions: none Previous ablations: none CHADS2VASC score:  5 Anticoagulation history: Eliquis   Past Medical History:  Diagnosis Date   Allergy    allergic rhinitis   Arthritis    Cellulitis of left leg 07-2014   hospitalized for 4 days   COPD (chronic obstructive pulmonary disease) (HCC)    GERD (gastroesophageal reflux disease)    History of granulomatous disease    Hyperlipidemia    Hypertension    Left leg swelling    Right-sided low back pain with sciatica    Past Surgical History:  Procedure Laterality Date   ABDOMINAL HYSTERECTOMY     broken ankle repair     left   CATARACT EXTRACTION     TOTAL KNEE ARTHROPLASTY     right    Current Outpatient Medications  Medication Sig Dispense Refill   amLODipine (NORVASC) 5 MG tablet Take 5 mg by mouth at bedtime.   5   ANORO ELLIPTA 62.5-25 MCG/INH AEPB Inhale 1 puff into the lungs daily.     ascorbic acid (VITAMIN C) 500 MG tablet Take by mouth.     atorvastatin (LIPITOR) 20 MG tablet Take 20 mg by mouth at bedtime.      calcium carbonate (OSCAL) 1500 (600 Ca) MG TABS tablet Take by mouth.     cetirizine (ZYRTEC) 10 MG tablet Take 10 mg by mouth daily.     ELIQUIS 2.5 MG TABS tablet TAKE 1 TABLET (2.5 MG TOTAL) BY MOUTH 2 (TWO) TIMES DAILY 180 tablet 2   famotidine (PEPCID) 20 MG tablet Take 20 mg by mouth at bedtime.      fluticasone (  FLONASE) 50 MCG/ACT nasal spray Place 2 sprays into both nostrils daily.     hydrALAZINE (APRESOLINE) 25 MG tablet Take 25 mg by mouth 2 (two) times daily.      losartan (COZAAR) 50 MG tablet Take 1 tablet by mouth daily.     metoprolol succinate (TOPROL-XL) 25 MG 24 hr tablet Take 1 tablet (25 mg total) by mouth daily. 90 tablet 2   montelukast (SINGULAIR) 10 MG tablet Take 10 mg by mouth daily.     omeprazole (PRILOSEC) 40 MG capsule Take 40 mg by mouth daily.      pramipexole (MIRAPEX) 0.125 MG tablet Take 0.125 mg by mouth at bedtime.  3   SODIUM FLUORIDE 5000 PPM 1.1 % GEL dental gel      Spacer/Aero-Holding Chambers (AEROCHAMBER MV) inhaler Use  as instructed with Albuterol HFA 1 each 0   VENTOLIN HFA 108 (90 BASE) MCG/ACT inhaler Inhale 1-2 puffs into the lungs every 6 (six) hours as needed for wheezing.   3   No current facility-administered medications for this encounter.    Allergies  Allergen Reactions   Bacitracin     unknown   Neosporin [Neomycin-Bacitracin Zn-Polymyx]     unknown   Polysporin [Bacitracin-Polymyxin B]     unknown    Social History   Socioeconomic History   Marital status: Widowed    Spouse name: Not on file   Number of children: Not on file   Years of education: Not on file   Highest education level: Not on file  Occupational History   Not on file  Tobacco Use   Smoking status: Former    Packs/day: 2.00    Years: 30.00    Pack years: 60.00    Types: Cigarettes    Quit date: 04/29/1979    Years since quitting: 41.4   Smokeless tobacco: Never  Vaping Use   Vaping Use: Not on file  Substance and Sexual Activity   Alcohol use: Yes    Alcohol/week: 2.0 - 3.0 standard drinks    Types: 2 - 3 Glasses of wine per week    Comment: weekly   Drug use: No   Sexual activity: Not on file  Other Topics Concern   Not on file  Social History Narrative   Not on file   Social Determinants of Health   Financial Resource Strain: Not on file  Food Insecurity: Not on file  Transportation Needs: Not on file  Physical Activity: Not on file  Stress: Not on file  Social Connections: Not on file  Intimate Partner Violence: Not on file     ROS- All systems are reviewed and negative except as per the HPI above.  Physical Exam: Vitals:   10/16/20 1329  BP: (!) 142/60  Pulse: 72  Weight: 59.9 kg  Height: 5' (1.524 m)     GEN- The patient is a well appearing elderly female, alert and oriented x 3 today.   HEENT-head normocephalic, atraumatic, sclera clear, conjunctiva pink, hearing intact, trachea midline. Lungs- Clear to ausculation bilaterally, normal work of breathing Heart- Regular rate and  rhythm, no murmurs, rubs or gallops  GI- soft, NT, ND, + BS Extremities- no clubbing, cyanosis, or edema MS- no significant deformity or atrophy Skin- no rash or lesion Psych- euthymic mood, full affect Neuro- strength and sensation are intact   Wt Readings from Last 3 Encounters:  10/16/20 59.9 kg  04/11/20 62 kg  01/29/20 62.6 kg    EKG today demonstrates  SR Vent. rate 72 BPM PR interval 182 ms QRS duration 68 ms QT/QTcB 386/422 ms  Echo 10/12/18 demonstrated   1. The left ventricle has normal systolic function with an ejection fraction of 60-65%. The cavity size was normal. Left ventricular diastolic Doppler parameters are consistent with impaired relaxation.  2. The mitral valve is grossly normal.  3. The tricuspid valve is grossly normal.  4. The aortic valve was not well visualized. Aortic valve regurgitation is trivial by color flow Doppler. No stenosis of the aortic valve.  5. Normal LV function; mild diastolic dysfunction; trace AI; mild TR; mildly elevated pulmonary pressure.  Epic records are reviewed at length today  Assessment and Plan:  1. Paroxysmal atrial fibrillation/atrial tachycardia  Patient appears to be maintaining SR. Continue Eliquis 2.5 mg BID. (age, Cr) Continue metoprolol 25 mg daily Apple Watch for home monitoring.   This patients CHA2DS2-VASc Score and unadjusted Ischemic Stroke Rate (% per year) is equal to 7.2 % stroke rate/year from a score of 5  Above score calculated as 1 point each if present [CHF, HTN, DM, Vascular=MI/PAD/Aortic Plaque, Age if 65-74, or Female] Above score calculated as 2 points each if present [Age > 75, or Stroke/TIA/TE]  2. HTN Stable, no changes today.   Follow up in the AF clinic in 6 months.     [facility] [address] [place], [place] [address] [phone] 10/16/2020 3:43 PM

## 2020-12-05 DIAGNOSIS — H52203 Unspecified astigmatism, bilateral: Secondary | ICD-10-CM | POA: Diagnosis not present

## 2020-12-05 DIAGNOSIS — Z961 Presence of intraocular lens: Secondary | ICD-10-CM | POA: Diagnosis not present

## 2020-12-05 DIAGNOSIS — H0100A Unspecified blepharitis right eye, upper and lower eyelids: Secondary | ICD-10-CM | POA: Diagnosis not present

## 2020-12-05 DIAGNOSIS — H353211 Exudative age-related macular degeneration, right eye, with active choroidal neovascularization: Secondary | ICD-10-CM | POA: Diagnosis not present

## 2020-12-17 DIAGNOSIS — H353122 Nonexudative age-related macular degeneration, left eye, intermediate dry stage: Secondary | ICD-10-CM | POA: Diagnosis not present

## 2020-12-17 DIAGNOSIS — H35363 Drusen (degenerative) of macula, bilateral: Secondary | ICD-10-CM | POA: Diagnosis not present

## 2020-12-17 DIAGNOSIS — H35453 Secondary pigmentary degeneration, bilateral: Secondary | ICD-10-CM | POA: Diagnosis not present

## 2020-12-17 DIAGNOSIS — H353211 Exudative age-related macular degeneration, right eye, with active choroidal neovascularization: Secondary | ICD-10-CM | POA: Diagnosis not present

## 2020-12-17 DIAGNOSIS — Z961 Presence of intraocular lens: Secondary | ICD-10-CM | POA: Diagnosis not present

## 2020-12-21 ENCOUNTER — Other Ambulatory Visit (HOSPITAL_COMMUNITY): Payer: Self-pay | Admitting: Physician Assistant

## 2021-02-15 ENCOUNTER — Other Ambulatory Visit: Payer: Self-pay

## 2021-02-15 ENCOUNTER — Observation Stay (HOSPITAL_BASED_OUTPATIENT_CLINIC_OR_DEPARTMENT_OTHER)
Admission: EM | Admit: 2021-02-15 | Discharge: 2021-02-16 | Disposition: A | Discharge status: Home / Self Care | Payer: Medicare Other | Attending: Internal Medicine | Admitting: Internal Medicine

## 2021-02-15 ENCOUNTER — Encounter (HOSPITAL_BASED_OUTPATIENT_CLINIC_OR_DEPARTMENT_OTHER): Payer: Self-pay

## 2021-02-15 ENCOUNTER — Emergency Department (HOSPITAL_BASED_OUTPATIENT_CLINIC_OR_DEPARTMENT_OTHER): Payer: Medicare Other

## 2021-02-15 DIAGNOSIS — N1832 Chronic kidney disease, stage 3b: Secondary | ICD-10-CM | POA: Diagnosis not present

## 2021-02-15 DIAGNOSIS — K449 Diaphragmatic hernia without obstruction or gangrene: Secondary | ICD-10-CM | POA: Diagnosis not present

## 2021-02-15 DIAGNOSIS — Z96651 Presence of right artificial knee joint: Secondary | ICD-10-CM | POA: Diagnosis not present

## 2021-02-15 DIAGNOSIS — I129 Hypertensive chronic kidney disease with stage 1 through stage 4 chronic kidney disease, or unspecified chronic kidney disease: Secondary | ICD-10-CM | POA: Insufficient documentation

## 2021-02-15 DIAGNOSIS — D649 Anemia, unspecified: Principal | ICD-10-CM | POA: Diagnosis present

## 2021-02-15 DIAGNOSIS — M1990 Unspecified osteoarthritis, unspecified site: Secondary | ICD-10-CM | POA: Diagnosis not present

## 2021-02-15 DIAGNOSIS — I10 Essential (primary) hypertension: Secondary | ICD-10-CM | POA: Diagnosis present

## 2021-02-15 DIAGNOSIS — R5383 Other fatigue: Secondary | ICD-10-CM | POA: Diagnosis not present

## 2021-02-15 DIAGNOSIS — Z20822 Contact with and (suspected) exposure to covid-19: Secondary | ICD-10-CM | POA: Insufficient documentation

## 2021-02-15 DIAGNOSIS — Z23 Encounter for immunization: Secondary | ICD-10-CM | POA: Insufficient documentation

## 2021-02-15 DIAGNOSIS — Z7901 Long term (current) use of anticoagulants: Secondary | ICD-10-CM | POA: Diagnosis not present

## 2021-02-15 DIAGNOSIS — Z87891 Personal history of nicotine dependence: Secondary | ICD-10-CM | POA: Insufficient documentation

## 2021-02-15 DIAGNOSIS — I480 Paroxysmal atrial fibrillation: Secondary | ICD-10-CM | POA: Diagnosis present

## 2021-02-15 DIAGNOSIS — E7800 Pure hypercholesterolemia, unspecified: Secondary | ICD-10-CM | POA: Diagnosis present

## 2021-02-15 DIAGNOSIS — R000 Tachycardia, unspecified: Secondary | ICD-10-CM | POA: Diagnosis not present

## 2021-02-15 DIAGNOSIS — K219 Gastro-esophageal reflux disease without esophagitis: Secondary | ICD-10-CM | POA: Diagnosis not present

## 2021-02-15 DIAGNOSIS — Q394 Esophageal web: Secondary | ICD-10-CM

## 2021-02-15 DIAGNOSIS — J449 Chronic obstructive pulmonary disease, unspecified: Secondary | ICD-10-CM | POA: Insufficient documentation

## 2021-02-15 DIAGNOSIS — E785 Hyperlipidemia, unspecified: Secondary | ICD-10-CM | POA: Diagnosis not present

## 2021-02-15 DIAGNOSIS — N183 Chronic kidney disease, stage 3 unspecified: Secondary | ICD-10-CM | POA: Diagnosis not present

## 2021-02-15 DIAGNOSIS — J439 Emphysema, unspecified: Secondary | ICD-10-CM | POA: Diagnosis not present

## 2021-02-15 DIAGNOSIS — M810 Age-related osteoporosis without current pathological fracture: Secondary | ICD-10-CM | POA: Diagnosis not present

## 2021-02-15 DIAGNOSIS — H9190 Unspecified hearing loss, unspecified ear: Secondary | ICD-10-CM

## 2021-02-15 DIAGNOSIS — Z79899 Other long term (current) drug therapy: Secondary | ICD-10-CM | POA: Insufficient documentation

## 2021-02-15 HISTORY — DX: Esophageal web: Q39.4

## 2021-02-15 LAB — CBC WITH DIFFERENTIAL/PLATELET
Abs Immature Granulocytes: 0.03 10*3/uL (ref 0.00–0.07)
Basophils Absolute: 0.1 10*3/uL (ref 0.0–0.1)
Basophils Relative: 2 %
Eosinophils Absolute: 0.1 10*3/uL (ref 0.0–0.5)
Eosinophils Relative: 1 %
HCT: 19.9 % — ABNORMAL LOW (ref 36.0–46.0)
Hemoglobin: 6.2 g/dL — CL (ref 12.0–15.0)
Immature Granulocytes: 1 %
Lymphocytes Relative: 16 %
Lymphs Abs: 0.9 10*3/uL (ref 0.7–4.0)
MCH: 21.7 pg — ABNORMAL LOW (ref 26.0–34.0)
MCHC: 31.2 g/dL (ref 30.0–36.0)
MCV: 69.6 fL — ABNORMAL LOW (ref 80.0–100.0)
Monocytes Absolute: 0.5 10*3/uL (ref 0.1–1.0)
Monocytes Relative: 10 %
Neutro Abs: 3.7 10*3/uL (ref 1.7–7.7)
Neutrophils Relative %: 70 %
Platelets: 167 10*3/uL (ref 150–400)
RBC: 2.86 MIL/uL — ABNORMAL LOW (ref 3.87–5.11)
RDW: 23.3 % — ABNORMAL HIGH (ref 11.5–15.5)
Smear Review: NORMAL
WBC: 5.3 10*3/uL (ref 4.0–10.5)
nRBC: 0 % (ref 0.0–0.2)

## 2021-02-15 LAB — COMPREHENSIVE METABOLIC PANEL
ALT: 14 U/L (ref 0–44)
AST: 20 U/L (ref 15–41)
Albumin: 3.8 g/dL (ref 3.5–5.0)
Alkaline Phosphatase: 71 U/L (ref 38–126)
Anion gap: 7 (ref 5–15)
BUN: 27 mg/dL — ABNORMAL HIGH (ref 8–23)
CO2: 21 mmol/L — ABNORMAL LOW (ref 22–32)
Calcium: 8.4 mg/dL — ABNORMAL LOW (ref 8.9–10.3)
Chloride: 110 mmol/L (ref 98–111)
Creatinine, Ser: 1.7 mg/dL — ABNORMAL HIGH (ref 0.44–1.00)
GFR, Estimated: 29 mL/min — ABNORMAL LOW (ref >60)
Glucose, Bld: 106 mg/dL — ABNORMAL HIGH (ref 70–99)
Potassium: 4.9 mmol/L (ref 3.5–5.1)
Sodium: 138 mmol/L (ref 135–145)
Total Bilirubin: 0.5 mg/dL (ref 0.3–1.2)
Total Protein: 6.3 g/dL — ABNORMAL LOW (ref 6.5–8.1)

## 2021-02-15 LAB — RESP PANEL BY RT-PCR (FLU A&B, COVID) ARPGX2
Influenza A by PCR: NEGATIVE
Influenza B by PCR: NEGATIVE
SARS Coronavirus 2 by RT PCR: NEGATIVE

## 2021-02-15 LAB — IRON AND TIBC
Iron: 14 ug/dL — ABNORMAL LOW (ref 28–170)
Saturation Ratios: 4 % — ABNORMAL LOW (ref 10.4–31.8)
TIBC: 384 ug/dL (ref 250–450)
UIBC: 370 ug/dL

## 2021-02-15 LAB — FERRITIN: Ferritin: 6 ng/mL — ABNORMAL LOW (ref 11–307)

## 2021-02-15 LAB — LIPASE, BLOOD: Lipase: 33 U/L (ref 11–51)

## 2021-02-15 LAB — LACTATE DEHYDROGENASE: LDH: 256 U/L — ABNORMAL HIGH (ref 98–192)

## 2021-02-15 LAB — PROTIME-INR
INR: 1.1 (ref 0.8–1.2)
Prothrombin Time: 14.7 seconds (ref 11.4–15.2)

## 2021-02-15 LAB — TROPONIN I (HIGH SENSITIVITY): Troponin I (High Sensitivity): 11 ng/L (ref <18)

## 2021-02-15 LAB — VITAMIN B12: Vitamin B-12: 4134 pg/mL — ABNORMAL HIGH (ref 180–914)

## 2021-02-15 LAB — OCCULT BLOOD X 1 CARD TO LAB, STOOL: Fecal Occult Bld: NEGATIVE

## 2021-02-15 MED ORDER — ALBUTEROL SULFATE (2.5 MG/3ML) 0.083% IN NEBU: 3.0000 mL | INHALATION_SOLUTION | Freq: Four times a day (QID) | RESPIRATORY_TRACT | Status: DC | PRN

## 2021-02-15 MED ORDER — MONTELUKAST SODIUM 10 MG PO TABS
10.0000 mg | ORAL_TABLET | Freq: Every day | ORAL | Status: DC
  Administered 2021-02-15: 10 mg via ORAL
  Filled 2021-02-15: qty 1

## 2021-02-15 MED ORDER — METOPROLOL SUCCINATE ER 25 MG PO TB24
25.0000 mg | ORAL_TABLET | Freq: Every day | ORAL | Status: DC
  Administered 2021-02-16: 25 mg via ORAL
  Filled 2021-02-15: qty 1

## 2021-02-15 MED ORDER — ACETAMINOPHEN 650 MG RE SUPP: 650.0000 mg | Freq: Four times a day (QID) | RECTAL | Status: DC | PRN

## 2021-02-15 MED ORDER — UMECLIDINIUM-VILANTEROL 62.5-25 MCG/ACT IN AEPB
1.0000 | INHALATION_SPRAY | Freq: Every day | RESPIRATORY_TRACT | Status: DC
  Administered 2021-02-16: 1 via RESPIRATORY_TRACT
  Filled 2021-02-15: qty 14

## 2021-02-15 MED ORDER — SODIUM CHLORIDE 0.9% IV SOLUTION: Freq: Once | INTRAVENOUS | Status: DC

## 2021-02-15 MED ORDER — HYDRALAZINE HCL 25 MG PO TABS
25.0000 mg | ORAL_TABLET | Freq: Two times a day (BID) | ORAL | Status: DC
  Administered 2021-02-15 – 2021-02-16 (×2): 25 mg via ORAL
  Filled 2021-02-15 (×2): qty 1

## 2021-02-15 MED ORDER — PANTOPRAZOLE SODIUM 40 MG IV SOLR
40.0000 mg | Freq: Once | INTRAVENOUS | Status: AC
  Administered 2021-02-15: 40 mg via INTRAVENOUS
  Filled 2021-02-15: qty 40

## 2021-02-15 MED ORDER — AMLODIPINE BESYLATE 5 MG PO TABS
5.0000 mg | ORAL_TABLET | Freq: Every day | ORAL | Status: DC
  Administered 2021-02-15: 5 mg via ORAL
  Filled 2021-02-15: qty 1

## 2021-02-15 MED ORDER — PANTOPRAZOLE SODIUM 40 MG PO TBEC
40.0000 mg | DELAYED_RELEASE_TABLET | Freq: Two times a day (BID) | ORAL | Status: DC
  Administered 2021-02-16: 40 mg via ORAL
  Filled 2021-02-15: qty 1

## 2021-02-15 MED ORDER — LORATADINE 10 MG PO TABS
10.0000 mg | ORAL_TABLET | Freq: Every day | ORAL | Status: DC
  Administered 2021-02-16: 10 mg via ORAL
  Filled 2021-02-15: qty 1

## 2021-02-15 MED ORDER — PRAMIPEXOLE DIHYDROCHLORIDE 0.25 MG PO TABS
0.1250 mg | ORAL_TABLET | Freq: Every day | ORAL | Status: DC
  Administered 2021-02-15: 0.125 mg via ORAL
  Filled 2021-02-15: qty 1

## 2021-02-15 MED ORDER — FLUTICASONE PROPIONATE 50 MCG/ACT NA SUSP
2.0000 | Freq: Every day | NASAL | Status: DC
  Filled 2021-02-15: qty 16

## 2021-02-15 MED ORDER — INFLUENZA VAC A&B SA ADJ QUAD 0.5 ML IM PRSY
0.5000 mL | PREFILLED_SYRINGE | INTRAMUSCULAR | Status: AC
  Administered 2021-02-16: 0.5 mL via INTRAMUSCULAR
  Filled 2021-02-15 (×2): qty 0.5

## 2021-02-15 MED ORDER — ACETAMINOPHEN 325 MG PO TABS
650.0000 mg | ORAL_TABLET | Freq: Four times a day (QID) | ORAL | Status: DC | PRN
  Administered 2021-02-15: 650 mg via ORAL
  Filled 2021-02-15: qty 2

## 2021-02-15 NOTE — ED Provider Notes (Signed)
[facility] EMERGENCY DEPARTMENT Provider Note   CSN: [id number] Arrival date & time: 02/15/21  1221     History Chief Complaint  Patient presents with   Abnormal Lab    Judith Boone is a 87 y.o. female.  This is a 87 y.o. female  with significant medical history as below, including COPD, arthritis, GERD, HTN, HLD who presents to the ED with complaint of abnormal lab.   Pt reports she was receiving screen blood work for upcoming physical, was called by office to report to ED for low hemoglobin. No history of iron deficiency, blood transfusion, blood per rectum or black stool, no increased bruising. She does report increased fatigue over the past 2 weeks, dyspnea on exertion worsened from her baseline. She uses oxygen PRN, has not had to use in the last few days. No cough or cp. She attributed her fatigue to ongoing COPD. She is on eliquis for afib, did not take her dose this morning. No history of bleeding disorder or malignancy.   The history is provided by the patient and a relative. No language interpreter was used.  Abnormal Lab     Past Medical History:  Diagnosis Date   Allergy    allergic rhinitis   Arthritis    Cellulitis of left leg 07-2014   hospitalized for 4 days   COPD (chronic obstructive pulmonary disease) (HCC)    GERD (gastroesophageal reflux disease)    History of granulomatous disease    Hyperlipidemia    Hypertension    Left leg swelling    Right-sided low back pain with sciatica     Patient Active Problem List   Diagnosis Date Noted   Secondary hypercoagulable state ([place]) 03/31/2019   Pneumonia 02/03/2019   AKI (acute kidney injury) ([place]) 02/03/2019   Paroxysmal atrial fibrillation ([place]) 02/03/2019   CKD (chronic kidney disease) stage 3, GFR 30-59 ml/min (HCC) 08/17/2014   Sepsis ([place]) 08/17/2014   Cellulitis of leg, left 08/16/2014   Pain of great toe 03/23/2012   Leg swelling 03/23/2012   Decreased hearing 12/23/2011   Renal  insufficiency 09/27/2011   Insomnia 05/16/2011   Radicular leg pain 05/16/2011   Hearing loss 12/12/2010   HTN (hypertension) 07/02/2010   Hypercholesteremia 07/02/2010   Arthritis 07/02/2010   GERD (gastroesophageal reflux disease) 07/02/2010   Esophageal web 07/02/2010   PAD (peripheral artery disease) ([place]) 07/02/2010    Past Surgical History:  Procedure Laterality Date   ABDOMINAL HYSTERECTOMY     broken ankle repair     left   CATARACT EXTRACTION     TOTAL KNEE ARTHROPLASTY     right     OB History   No obstetric history on file.     Family History  Problem Relation Age of Onset   Alcohol abuse Father    Heart disease Father    Hypertension Father    Hypertension Daughter    Hypertension Son    Asthma Son     Social History   Tobacco Use   Smoking status: Former    Packs/day: 2.00    Years: 30.00    Pack years: 60.00    Types: Cigarettes    Quit date: 04/29/1979    Years since quitting: 41.8   Smokeless tobacco: Never  Substance Use Topics   Alcohol use: Yes    Alcohol/week: 2.0 - 3.0 standard drinks    Types: 2 - 3 Glasses of wine per week    Comment: weekly  Drug use: No    Home Medications Prior to Admission medications   Medication Sig Start Date End Date Taking? Authorizing Provider  amLODipine (NORVASC) 5 MG tablet Take 5 mg by mouth at bedtime.  04/13/15   [provider]  ANORO ELLIPTA 62.5-25 MCG/INH AEPB Inhale 1 puff into the lungs daily. 07/04/18   [provider]  ascorbic acid (VITAMIN C) 500 MG tablet Take by mouth.    [provider]  atorvastatin (LIPITOR) 20 MG tablet Take 20 mg by mouth at bedtime.     [provider]  calcium carbonate (OSCAL) 1500 (600 Ca) MG TABS tablet Take by mouth.    [provider]  cetirizine (ZYRTEC) 10 MG tablet Take 10 mg by mouth daily.    [provider]  ELIQUIS 2.5 MG TABS tablet TAKE 1 TABLET (2.5 MG TOTAL) BY MOUTH 2 (TWO) TIMES DAILY 12/21/20    Fenton, Clint R, PA  famotidine (PEPCID) 20 MG tablet Take 20 mg by mouth at bedtime.     [provider]  fluticasone (FLONASE) 50 MCG/ACT nasal spray Place 2 sprays into both nostrils daily. 07/13/19   [provider]  hydrALAZINE (APRESOLINE) 25 MG tablet Take 25 mg by mouth 2 (two) times daily.     [provider]  losartan (COZAAR) 50 MG tablet Take 1 tablet by mouth daily. 08/21/20   [provider]  metoprolol succinate (TOPROL-XL) 25 MG 24 hr tablet Take 1 tablet (25 mg total) by mouth daily. 07/23/18   Fenton, Clint R, PA  montelukast (SINGULAIR) 10 MG tablet Take 10 mg by mouth daily. 08/02/19   [provider]  omeprazole (PRILOSEC) 40 MG capsule Take 40 mg by mouth daily.  07/22/18   [provider]  pramipexole (MIRAPEX) 0.125 MG tablet Take 0.125 mg by mouth at bedtime. 02/09/15   [provider]  SODIUM FLUORIDE 5000 PPM 1.1 % GEL dental gel  03/06/20   [provider]  Spacer/Aero-Holding Chambers (AEROCHAMBER MV) inhaler Use as instructed with Albuterol HFA 10/07/19   Parrett, Tammy S, NP  VENTOLIN HFA 108 (90 BASE) MCG/ACT inhaler Inhale 1-2 puffs into the lungs every 6 (six) hours as needed for wheezing.  03/03/15   [provider]    Allergies    Bacitracin, Neosporin [neomycin-bacitracin zn-polymyx], and Polysporin [bacitracin-polymyxin b]  Review of Systems   Review of Systems  Constitutional:  Positive for fatigue. Negative for chills and fever.  HENT:  Negative for facial swelling and trouble swallowing.   Eyes:  Negative for photophobia and visual disturbance.  Respiratory:  Positive for shortness of breath. Negative for cough.   Cardiovascular:  Negative for chest pain and palpitations.  Gastrointestinal:  Negative for abdominal pain, nausea and vomiting.  Endocrine: Negative for polydipsia and polyuria.  Genitourinary:  Negative for difficulty urinating and hematuria.  Musculoskeletal:   Negative for gait problem and joint swelling.  Skin:  Negative for pallor and rash.  Neurological:  Negative for syncope and headaches.  Psychiatric/Behavioral:  Negative for agitation and confusion.    Physical Exam Updated Vital Signs BP (!) 148/56   Pulse 82   Temp 98 F (36.7 C) (Oral)   Resp 16   Ht 5' (1.524 m)   Wt 62.6 kg   SpO2 99%   BMI 26.95 kg/m   Physical Exam Vitals and nursing note reviewed. Exam conducted with a chaperone present.  Constitutional:      General: She is not in acute  distress.    Appearance: Normal appearance.  HENT:     Head: Normocephalic and atraumatic.     Right Ear: External ear normal.     Left Ear: External ear normal.     Nose: Nose normal.     Mouth/Throat:     Mouth: Mucous membranes are moist.  Eyes:     General: No scleral icterus.       Right eye: No discharge.        Left eye: No discharge.     Comments: Conjunctival pallor  Cardiovascular:     Rate and Rhythm: Normal rate and regular rhythm.     Pulses: Normal pulses.     Heart sounds: Normal heart sounds.  Pulmonary:     Effort: Pulmonary effort is normal. No respiratory distress.     Breath sounds: Normal breath sounds.  Abdominal:     General: Abdomen is flat.     Tenderness: There is no abdominal tenderness.  Genitourinary:    Comments: No external hemorrhoids or fissures, no frank bleeding.  Musculoskeletal:        General: Normal range of motion.     Cervical back: Normal range of motion.     Right lower leg: No edema.     Left lower leg: No edema.  Skin:    General: Skin is warm and dry.     Capillary Refill: Capillary refill takes less than 2 seconds.  Neurological:     Mental Status: She is alert.  Psychiatric:        Mood and Affect: Mood normal.        Behavior: Behavior normal.    ED Results / Procedures / Treatments   Labs (all labs ordered are listed, but only abnormal results are displayed) Labs Reviewed  CBC WITH DIFFERENTIAL/PLATELET -  Abnormal; Notable for the following components:      Result Value   RBC 2.86 (*)    Hemoglobin 6.2 (*)    HCT 19.9 (*)    MCV 69.6 (*)    MCH 21.7 (*)    RDW 23.3 (*)    All other components within normal limits  COMPREHENSIVE METABOLIC PANEL - Abnormal; Notable for the following components:   CO2 21 (*)    Glucose, Bld 106 (*)    BUN 27 (*)    Creatinine, Ser 1.70 (*)    Calcium 8.4 (*)    Total Protein 6.3 (*)    GFR, Estimated 29 (*)    All other components within normal limits  LACTATE DEHYDROGENASE - Abnormal; Notable for the following components:   LDH 256 (*)    All other components within normal limits  LIPASE, BLOOD  PROTIME-INR  OCCULT BLOOD X 1 CARD TO LAB, STOOL  IRON AND TIBC  FERRITIN  VITAMIN B12  TROPONIN I (HIGH SENSITIVITY)    EKG EKG Interpretation  Date/Time:  Friday February 15 2021 14:29:20 EDT Ventricular Rate:  81 PR Interval:  262 QRS Duration: 84 QT Interval:  399 QTC Calculation: 464 R Axis:   47 Text Interpretation: Sinus rhythm Prolonged PR interval Interpretation limited secondary to artifact Confirmed by Wynona Dove ([id number]) on 02/15/2021 2:57:20 PM  Radiology No results found.  Procedures Procedures   Medications Ordered in ED Medications  pantoprazole (PROTONIX) injection 40 mg (40 mg Intravenous Given 02/15/21 1307)    ED Course  I have reviewed the triage vital signs and the nursing notes.  Pertinent labs & imaging results that were available during  my care of the patient were reviewed by me and considered in my medical decision making (see chart for details).    MDM Rules/Calculators/A&P                          CC: abn lab  This patient complains of abnormal lab, fatigue; this involves an extensive number of treatment options and is a complaint that carries with it a high risk of complications and morbidity. Vital signs were reviewed. Serious etiologies considered.  Record review:   Previous records obtained and  reviewed   Additional history obtained from family at bedside  Work up as above, notable for:  Hemoglobin at 6.2, MCV 69.9, microcytic anemia, iron studies sent with LDH and B12 (folate not avail at this facility). FOBT was negative. She was given protonix prior to FOBT result. She remains hemodynamically stable.  She is on eliquis for afib, last dose yesterday.  Labs & imaging results that were available during my care of the patient were reviewed by me and considered in my medical decision making.    At this time recommend patient for transfer to [facility] for blood transfusion for her symptomatic anemia. She is agreeable. Patient agreeable to blood transfusion. D/w Dr Lorin Mercy who accepts patient for transfer.        This chart was dictated using voice recognition software.  Despite best efforts to proofread,  errors can occur which can change the documentation meaning.  Final Clinical Impression(s) / ED Diagnoses Final diagnoses:  Symptomatic anemia  Other fatigue    Rx / DC Orders ED Discharge Orders     None        Jeanell Sparrow, DO 02/15/21 1457

## 2021-02-15 NOTE — ED Notes (Signed)
Patient states she was told her hgb was 6. C/O fatigue for the last few weeks. Denies any bleeding or dark stools.

## 2021-02-15 NOTE — Progress Notes (Signed)
Received a phone call from Facility: [facility]  Requesting MD: Pearline Cables Patient with h/o COPD; HTN; afib on Eliquis; and HLD presenting with abnormal lab at PCP office..  Hgb 6.0 at PCP office, 6.2 today.  Heme negative, hemodynamically stable.  May be iron deficiency. Plan of care: Needs transfusion.  We are unable to do this at [facility] so will accept in observation at either [facility] or [facility].   The patient will be accepted for admission to med surg when bed is available.    Nursing staff, Please call the [place] number at the top of Amion at the time of the patient's arrival so that the patient can be paged to the admitting physician.   Carlyon Shadow, M.D. [facility]

## 2021-02-15 NOTE — ED Notes (Signed)
SpO2 88-90 on r/a, denies SHOB, placed on 2LPM [place] SpO2  now 97%

## 2021-02-15 NOTE — H&P (Signed)
History and Physical    Judith Boone:[id number] DOB: May 30, 1931 DOA: 02/15/2021  PCP: Velna Hatchet, MD  Patient coming from: Home  Chief Complaint: Low H/H  HPI: Judith Boone is a 87 y.o. female with medical history significant of COPD, GERD, HLD, HTN, CKD 3, pAF, esophageal web s/p EGD dilation who presents due to low H/H.  Ms. Osterberg reports that she went in for her normal pre-physical labs and was called to say her H/H were low.  In retrospect, she has been more SOB and more tired over the last few weeks.  She has a chronic cough and occasionally coughs up streaks of blood.  She has had no dark stools, bright red blood in her stool, vaginal bleeding or urinary blood.  She did notice one episode of orange emesis a couple of weeks ago, but this has not recurred.  She has chronic GERD, is on PPI and H2 blocker.  She has a history of an esophageal web and GI bleeding about 11 years ago, but nothing since.  She has noticed an increased difficulty taking pills over the last few weeks and is concerned the web is recurring.  She takes eliquis for previous Afib, currently in NSR.  ED Course: In the ED, she was confirmed to have a low H/H and transfer to [facility] was requested. Iron studies done showed very low iron, ferritin of 6  Review of Systems: As per HPI otherwise all other systems reviewed and are negative.  Past Medical History:  Diagnosis Date   Allergy    allergic rhinitis   Arthritis    Cellulitis of left leg 07/28/2014   hospitalized for 4 days   COPD (chronic obstructive pulmonary disease) (HCC)    Esophageal web    GERD (gastroesophageal reflux disease)    History of granulomatous disease    Hyperlipidemia    Hypertension    Left leg swelling    Right-sided low back pain with sciatica     Past Surgical History:  Procedure Laterality Date   ABDOMINAL HYSTERECTOMY     broken ankle repair     left   CATARACT EXTRACTION     TOTAL KNEE ARTHROPLASTY     right    Social  History  reports that she quit smoking about 41 years ago. Her smoking use included cigarettes. She has a 60.00 pack-year smoking history. She has never used smokeless tobacco. She reports current alcohol use of about 2.0 - 3.0 standard drinks per week. She reports that she does not use drugs.  Allergies  Allergen Reactions   Bacitracin     unknown   Neosporin [Neomycin-Bacitracin Zn-Polymyx]     unknown   Polysporin [Bacitracin-Polymyxin B]     unknown    Family History  Problem Relation Age of Onset   Alcohol abuse Father    Heart disease Father    Hypertension Father    Hypertension Daughter    Hypertension Son    Asthma Son     Prior to Admission medications   Medication Sig Start Date End Date Taking? Authorizing Provider  amLODipine (NORVASC) 5 MG tablet Take 5 mg by mouth at bedtime.  04/13/15   [provider]  ANORO ELLIPTA 62.5-25 MCG/INH AEPB Inhale 1 puff into the lungs daily. 07/04/18   [provider]  ascorbic acid (VITAMIN C) 500 MG tablet Take by mouth.    [provider]  atorvastatin (LIPITOR) 20 MG tablet Take 20 mg by mouth at bedtime.  [provider]  calcium carbonate (OSCAL) 1500 (600 Ca) MG TABS tablet Take by mouth.    [provider]  cetirizine (ZYRTEC) 10 MG tablet Take 10 mg by mouth daily.    [provider]  ELIQUIS 2.5 MG TABS tablet TAKE 1 TABLET (2.5 MG TOTAL) BY MOUTH 2 (TWO) TIMES DAILY 12/21/20   Fenton, Clint R, PA  famotidine (PEPCID) 20 MG tablet Take 20 mg by mouth at bedtime.     [provider]  fluticasone (FLONASE) 50 MCG/ACT nasal spray Place 2 sprays into both nostrils daily. 07/13/19   [provider]  hydrALAZINE (APRESOLINE) 25 MG tablet Take 25 mg by mouth 2 (two) times daily.     [provider]  losartan (COZAAR) 50 MG tablet Take 1 tablet by mouth daily. 08/21/20   [provider]  metoprolol succinate (TOPROL-XL) 25 MG 24 hr tablet Take 1  tablet (25 mg total) by mouth daily. 07/23/18   Fenton, Clint R, PA  montelukast (SINGULAIR) 10 MG tablet Take 10 mg by mouth daily. 08/02/19   [provider]  omeprazole (PRILOSEC) 40 MG capsule Take 40 mg by mouth daily.  07/22/18   [provider]  pramipexole (MIRAPEX) 0.125 MG tablet Take 0.125 mg by mouth at bedtime. 02/09/15   [provider]  SODIUM FLUORIDE 5000 PPM 1.1 % GEL dental gel  03/06/20   [provider]  Spacer/Aero-Holding Chambers (AEROCHAMBER MV) inhaler Use as instructed with Albuterol HFA 10/07/19   Parrett, Tammy S, NP  VENTOLIN HFA 108 (90 BASE) MCG/ACT inhaler Inhale 1-2 puffs into the lungs every 6 (six) hours as needed for wheezing.  03/03/15   [provider]    Physical Exam: Vitals:   02/15/21 1400 02/15/21 1800 02/15/21 1814 02/15/21 2001  BP: (!) 148/56 (!) 163/75 (!) 163/75 (!) 165/81  Pulse: 82  79 90  Resp: 16 (!) 29 20 16   Temp:    98.1 F (36.7 C)  TempSrc:    Oral  SpO2: 99%  99% 100%  Weight:      Height:    5\' 5"  (1.651 m)    Constitutional: NAD, calm, comfortable Eyes: she has conjunctival pallor, no icterus or injection ENMT: Mucous membranes are moist.  Neck: normal, supple Respiratory: clear to auscultation bilaterally, no wheezing, no crackles.  Cardiovascular: RR, NR, no murmur.  She has no peripheral edema, mild tenderness to palpation of the lower extremities.  Abdomen: +BS, NT, ND Musculoskeletal: She has significant changes to the joints of the fingers with enlarged joints and pain to palpation.  She has normal bulk and tone.  Skin: She has chronic darkened skin changes to the right shin.  She has a small healing abrasion to the right shin.  Otherwise, no rashes on exposed skin.  Neurologic: Grossly intact, moving all extremities easily.  Psychiatric: Normal judgment and insight. Alert and oriented x 3. Normal mood.   Labs on Admission: I have personally reviewed following labs and imaging  studies  CBC: Recent Labs  Lab 02/15/21 1256  WBC 5.3  NEUTROABS 3.7  HGB 6.2*  HCT 19.9*  MCV 69.6*  PLT 720    Basic Metabolic Panel: Recent Labs  Lab 02/15/21 1256  NA 138  K 4.9  CL 110  CO2 21*  GLUCOSE 106*  BUN 27*  CREATININE 1.70*  CALCIUM 8.4*    GFR: Estimated Creatinine Clearance: 21 mL/min (A) (by C-G formula based on SCr of 1.7 mg/dL (H)).  Liver Function Tests: Recent Labs  Lab 02/15/21 1256  AST 20  ALT 14  ALKPHOS 71  BILITOT 0.5  PROT 6.3*  ALBUMIN 3.8       Radiological Exams on Admission: DG Chest Portable 1 View  Result Date: 02/15/2021 CLINICAL DATA:  dib.  Low hemoglobin. EXAM: PORTABLE CHEST 1 VIEW COMPARISON:  CT chest 09/02/2019, chest x-ray 02/03/2019 FINDINGS: The heart and mediastinal contours are unchanged. Aortic calcification. Redemonstration of an at least moderate volume hiatal hernia. Similar-appearing left mid lung zone calcified pulmonary nodule likely representing a granuloma. Multiple scattered punctate calcified nodules consistent with chronic granuloma. No focal consolidation. Coarsened interstitial markings. No overt pulmonary edema. No pleural effusion. No pneumothorax. No acute osseous abnormality. IMPRESSION: 1. No acute cardiopulmonary abnormality. 2. Aortic Atherosclerosis (ICD10-I70.0) and Emphysema (ICD10-J43.9). 3. At least moderate volume hiatal hernia. Electronically Signed   By: Iven Finn M.D.   On: 02/15/2021 15:03    EKG: Independently reviewed. NSR with prolonged PR interval.  No ST changes.  [place] baseline.   Assessment/Plan  Symptomatic anemia - patient with symptoms of symptomatic anemia, likely related to iron deficiency and poor absorption - PRBC X 2 ordered, patient consented - Follow up H/H - Monitor for blood loss - Will need GI follow up for esophageal web in the outpatient setting  HTN (hypertension) - BP is currently elevated, will restart home amlodipine and metoprolol, also  restart hydralazine and losartan tomorrow if BP remains high    Hypercholesteremia - Continue atorvastatin    Arthritis - Also with RLS - Requesting tylenol - ordered - Continue home pramipexole    GERD (gastroesophageal reflux disease) Esophageal web - She is on a PPI and H2 blocker at home, has hiatal hernia, possibly recurrence of esophageal web - After blood transfusion, she will likely need iron infusions outpatient and GI follow up.  - Iron deficiency can be associated with webs and we discussed this today    Hearing loss - She is very hard of hearing, follow up outpatient    CKD (chronic kidney disease) stage 3, GFR 30-59 ml/min (HCC) - Labs today are mildly worse than baseline, trend and repeat tomorrow after blood transfusion - IVF as needed overnight    Paroxysmal atrial fibrillation (HCC) - Hold eliquis in the setting of symptomatic anemia, can restart tomorrow if she shows no signs/symptoms of bleeding overnight.    DVT prophylaxis: SCDs  Code Status:   Full  Family Communication:  Son at bedside  Disposition Plan:   Patient is from:  Home  Anticipated DC to:  Home  Anticipated DC date:  10/22  Anticipated DC barriers: None  Consults called:  None  Admission status:  Obs, med surg   Severity of Illness: The appropriate patient status for this patient is OBSERVATION. Observation status is judged to be reasonable and necessary in order to provide the required intensity of service to ensure the patient's safety. The patient's presenting symptoms, physical exam findings, and initial radiographic and laboratory data in the context of their medical condition is felt to place them at decreased risk for further clinical deterioration. Furthermore, it is anticipated that the patient will be medically stable for discharge from the hospital within 2 midnights of admission.     Gilles Chiquito MD [facility]  How to contact the [facility] Attending or Consulting provider [place] or covering provider during after hours [place], for this patient?   Check the care team in [facility] and look  for a) attending/consulting [facility] provider listed and b) the [facility] team listed Log into [email or web address] and use [place]'s universal password to access. If you do not have the password, please contact the hospital operator. Locate the [facility] provider you are looking for under [facility] and page to a number that you can be directly reached. If you still have difficulty reaching the provider, please page the [facility] (Director on Call) for the Hospitalists listed on amion for assistance.  02/15/2021, 8:52 PM

## 2021-02-15 NOTE — ED Notes (Signed)
Pt up to restroom with assistance.

## 2021-02-15 NOTE — ED Triage Notes (Signed)
Pt states she  had labs drawn this am-was called and advised hgb 6-NAD- to triage in w/c

## 2021-02-15 NOTE — ED Notes (Signed)
Anderson Malta rn on floor states they cannot take report because they are in the middle of shift change, at 1854. RN made aware that report will be given by transport on arrival.

## 2021-02-15 NOTE — ED Notes (Signed)
Pt knitting in bed,NAD. A/ox4, pt states she has been feeling more SOB and fatigued recently. Pt states she went in for yearly physical and bloodwork and was told to come to the ED for low Hgb. Pt denies dizziness, CP, SOB, ABD pain, dark stools. Abd soft non tender.

## 2021-02-16 DIAGNOSIS — D649 Anemia, unspecified: Secondary | ICD-10-CM | POA: Diagnosis not present

## 2021-02-16 LAB — CBC
HCT: 28.2 % — ABNORMAL LOW (ref 36.0–46.0)
Hemoglobin: 8.7 g/dL — ABNORMAL LOW (ref 12.0–15.0)
MCH: 23.3 pg — ABNORMAL LOW (ref 26.0–34.0)
MCHC: 30.9 g/dL (ref 30.0–36.0)
MCV: 75.6 fL — ABNORMAL LOW (ref 80.0–100.0)
Platelets: 130 10*3/uL — ABNORMAL LOW (ref 150–400)
RBC: 3.73 MIL/uL — ABNORMAL LOW (ref 3.87–5.11)
RDW: 21.5 % — ABNORMAL HIGH (ref 11.5–15.5)
WBC: 4.6 10*3/uL (ref 4.0–10.5)
nRBC: 0 % (ref 0.0–0.2)

## 2021-02-16 LAB — PROTIME-INR
INR: 1.1 (ref 0.8–1.2)
Prothrombin Time: 14.3 seconds (ref 11.4–15.2)

## 2021-02-16 LAB — BASIC METABOLIC PANEL
Anion gap: 7 (ref 5–15)
BUN: 20 mg/dL (ref 8–23)
CO2: 21 mmol/L — ABNORMAL LOW (ref 22–32)
Calcium: 8.5 mg/dL — ABNORMAL LOW (ref 8.9–10.3)
Chloride: 111 mmol/L (ref 98–111)
Creatinine, Ser: 1.51 mg/dL — ABNORMAL HIGH (ref 0.44–1.00)
GFR, Estimated: 33 mL/min — ABNORMAL LOW (ref >60)
Glucose, Bld: 79 mg/dL (ref 70–99)
Potassium: 4.7 mmol/L (ref 3.5–5.1)
Sodium: 139 mmol/L (ref 135–145)

## 2021-02-16 MED ORDER — SODIUM CHLORIDE 0.9 % IV SOLN
510.0000 mg | Freq: Once | INTRAVENOUS | Status: AC
  Administered 2021-02-16: 510 mg via INTRAVENOUS
  Filled 2021-02-16: qty 17

## 2021-02-16 NOTE — Discharge Summary (Signed)
Physician Discharge Summary  Judith Boone AJG:[id number] DOB: [birth year] DOA: 02/15/2021  PCP: Velna Hatchet, MD  Admit date: 02/15/2021 Discharge date: 02/16/2021  Admitted From: home Discharge disposition: home   Recommendations for Outpatient Follow-Up:   Follow iron levels Outpatient GI follow up CBC 1 week   Discharge Diagnosis:   Principal Problem:   Symptomatic anemia Active Problems:   HTN (hypertension)   Hypercholesteremia   Arthritis   GERD (gastroesophageal reflux disease)   Esophageal web   Other fatigue   Hearing loss   CKD (chronic kidney disease) stage 3, GFR 30-59 ml/min (HCC)   Paroxysmal atrial fibrillation ([place])    Discharge Condition: Improved.  Diet recommendation: Regular.  Wound care: None.  Code status: Full.   History of Present Illness:   Judith Boone is a 87 y.o. female with medical history significant of COPD, GERD, HLD, HTN, CKD 3, pAF, esophageal web s/p EGD dilation who presents due to low H/H.  Judith Boone reports that she went in for her normal pre-physical labs and was called to say her H/H were low.  In retrospect, she has been more SOB and more tired over the last few weeks.  She has a chronic cough and occasionally coughs up streaks of blood.  She has had no dark stools, bright red blood in her stool, vaginal bleeding or urinary blood.  She did notice one episode of orange emesis a couple of weeks ago, but this has not recurred.  She has chronic GERD, is on PPI and H2 blocker.  She has a history of an esophageal web and GI bleeding about 11 years ago, but nothing since.  She has noticed an increased difficulty taking pills over the last few weeks and is concerned the web is recurring.  She takes eliquis for previous Afib, currently in NSR.   Hospital Course by Problem:   Symptomatic anemia - patient with symptoms of symptomatic anemia, likely related to iron deficiency and poor absorption - PRBC X 2 ordered, patient  consented -heme negative -Fe def - Will need GI follow up for esophageal web in the outpatient setting   HTN (hypertension) - resume home meds    Hypercholesteremia - Continue atorvastatin    Arthritis - Also with RLS - Continue home pramipexole    GERD (gastroesophageal reflux disease) Esophageal web - She is on a PPI and H2 blocker at home, has hiatal hernia, possibly recurrence of esophageal web - After blood transfusion, she will likely need iron infusions outpatient and GI follow up.  - Iron deficiency can be associated with webs    Hearing loss - She is very hard of hearing, follow up outpatient    CKD (chronic kidney disease) stage 3, GFR 30-59 ml/min (HCC) - stable    Paroxysmal atrial fibrillation (HCC) -restart eliquis as no sign of bleeding    Medical Consultants:      Discharge Exam:   Vitals:   02/16/21 0702 02/16/21 1205  BP:  139/62  Pulse:  80  Resp:  17  Temp:    SpO2: 98% 94%   Vitals:   02/16/21 0145 02/16/21 0415 02/16/21 0702 02/16/21 1205  BP: (!) 150/71 140/63  139/62  Pulse: 76 67  80  Resp: 18 18  17   Temp: 97.7 F (36.5 C) 97.9 F (36.6 C)    TempSrc: Oral Oral    SpO2: 99% 99% 98% 94%  Weight:      Height:  General exam: Appears calm and comfortable.   The results of significant diagnostics from this hospitalization (including imaging, microbiology, ancillary and laboratory) are listed below for reference.     Procedures and Diagnostic Studies:   DG Chest Portable 1 View  Result Date: 02/15/2021 CLINICAL DATA:  dib.  Low hemoglobin. EXAM: PORTABLE CHEST 1 VIEW COMPARISON:  CT chest 09/02/2019, chest x-ray 02/03/2019 FINDINGS: The heart and mediastinal contours are unchanged. Aortic calcification. Redemonstration of an at least moderate volume hiatal hernia. Similar-appearing left mid lung zone calcified pulmonary nodule likely representing a granuloma. Multiple scattered punctate calcified nodules consistent with  chronic granuloma. No focal consolidation. Coarsened interstitial markings. No overt pulmonary edema. No pleural effusion. No pneumothorax. No acute osseous abnormality. IMPRESSION: 1. No acute cardiopulmonary abnormality. 2. Aortic Atherosclerosis (ICD10-I70.0) and Emphysema (ICD10-J43.9). 3. At least moderate volume hiatal hernia. Electronically Signed   By: Iven Finn M.D.   On: 02/15/2021 15:03     Labs:   Basic Metabolic Panel: Recent Labs  Lab 02/15/21 1256 02/16/21 0611  NA 138 139  K 4.9 4.7  CL 110 111  CO2 21* 21*  GLUCOSE 106* 79  BUN 27* 20  CREATININE 1.70* 1.51*  CALCIUM 8.4* 8.5*   GFR Estimated Creatinine Clearance: 23.6 mL/min (A) (by C-G formula based on SCr of 1.51 mg/dL (H)). Liver Function Tests: Recent Labs  Lab 02/15/21 1256  AST 20  ALT 14  ALKPHOS 71  BILITOT 0.5  PROT 6.3*  ALBUMIN 3.8   Recent Labs  Lab 02/15/21 1256  LIPASE 33   No results for input(s): AMMONIA in the last 168 hours. Coagulation profile Recent Labs  Lab 02/15/21 1256 02/16/21 0611  INR 1.1 1.1    CBC: Recent Labs  Lab 02/15/21 1256 02/16/21 0611  WBC 5.3 4.6  NEUTROABS 3.7  --   HGB 6.2* 8.7*  HCT 19.9* 28.2*  MCV 69.6* 75.6*  PLT 167 130*   Cardiac Enzymes: No results for input(s): CKTOTAL, CKMB, CKMBINDEX, TROPONINI in the last 168 hours. BNP: Invalid input(s): POCBNP CBG: No results for input(s): GLUCAP in the last 168 hours. D-Dimer No results for input(s): DDIMER in the last 72 hours. Hgb A1c No results for input(s): HGBA1C in the last 72 hours. Lipid Profile No results for input(s): CHOL, HDL, LDLCALC, TRIG, CHOLHDL, LDLDIRECT in the last 72 hours. Thyroid function studies No results for input(s): TSH, T4TOTAL, T3FREE, THYROIDAB in the last 72 hours.  Invalid input(s): FREET3 Anemia work up Recent Labs    02/15/21 1412  VITAMINB12 4,134*  FERRITIN 6*  TIBC 384  IRON 14*   Microbiology Recent Results (from the past 240 hour(s))   Resp Panel by RT-PCR (Flu A&B, Covid) Nasopharyngeal Swab     Status: None   Collection Time: 02/15/21  4:26 PM   Specimen: Nasopharyngeal Swab; Nasopharyngeal(NP) swabs in vial transport medium  Result Value Ref Range Status   SARS Coronavirus 2 by RT PCR NEGATIVE NEGATIVE Final    Comment: (NOTE) SARS-CoV-2 target nucleic acids are NOT DETECTED.  The SARS-CoV-2 RNA is generally detectable in upper respiratory specimens during the acute phase of infection. The lowest concentration of SARS-CoV-2 viral copies this assay can detect is 138 copies/mL. A negative result does not preclude SARS-Cov-2 infection and should not be used as the sole basis for treatment or other patient management decisions. A negative result may occur with  improper specimen collection/handling, submission of specimen other than nasopharyngeal swab, presence of viral mutation(s) within the areas targeted  by this assay, and inadequate number of viral copies(<138 copies/mL). A negative result must be combined with clinical observations, patient history, and epidemiological information. The expected result is Negative.  Fact Sheet for Patients:  EntrepreneurPulse.com.au  Fact Sheet for Healthcare Providers:  IncredibleEmployment.be  This test is no t yet approved or cleared by the [place] FDA and  has been authorized for detection and/or diagnosis of SARS-CoV-2 by FDA under an Emergency Use Authorization (EUA). This EUA will remain  in effect (meaning this test can be used) for the duration of the COVID-19 declaration under Section 564(b)(1) of the Act, 21 U.S.C.section 360bbb-3(b)(1), unless the authorization is terminated  or revoked sooner.       Influenza A by PCR NEGATIVE NEGATIVE Final   Influenza B by PCR NEGATIVE NEGATIVE Final    Comment: (NOTE) The Xpert Xpress SARS-CoV-2/FLU/RSV plus assay is intended as an aid in the diagnosis of influenza from  Nasopharyngeal swab specimens and should not be used as a sole basis for treatment. Nasal washings and aspirates are unacceptable for Xpert Xpress SARS-CoV-2/FLU/RSV testing.  Fact Sheet for Patients: EntrepreneurPulse.com.au  Fact Sheet for Healthcare Providers: IncredibleEmployment.be  This test is not yet approved or cleared by the [place] FDA and has been authorized for detection and/or diagnosis of SARS-CoV-2 by FDA under an Emergency Use Authorization (EUA). This EUA will remain in effect (meaning this test can be used) for the duration of the COVID-19 declaration under Section 564(b)(1) of the Act, 21 U.S.C. section 360bbb-3(b)(1), unless the authorization is terminated or revoked.  Performed at [facility], [address] [place], [place] [address]      Discharge Instructions:   Discharge Instructions     Diet - low sodium heart healthy   Complete by: As directed    Discharge instructions   Complete by: As directed    Cbc/bmp 1 week Fe studies-- may need PRN IV Fe   Increase activity slowly   Complete by: As directed       Allergies as of 02/16/2021       Reactions   Bacitracin    unknown   Neosporin [neomycin-bacitracin Zn-polymyx]    unknown   Polysporin [bacitracin-polymyxin B]    unknown        Medication List     STOP taking these medications    VITAMIN B12 PO       TAKE these medications    acetaminophen 500 MG tablet Commonly known as: TYLENOL Take 1,500 mg by mouth every 6 (six) hours as needed for mild pain.   AeroChamber MV inhaler Use as instructed with Albuterol HFA   amLODipine 5 MG tablet Commonly known as: NORVASC Take 5 mg by mouth at bedtime.   Anoro Ellipta 62.5-25 MCG/ACT Aepb Generic drug: umeclidinium-vilanterol Inhale 1 puff into the lungs daily.   ascorbic acid 500 MG tablet Commonly known as: VITAMIN C Take 500 mg by mouth daily.   atorvastatin 20  MG tablet Commonly known as: LIPITOR Take 20 mg by mouth at bedtime.   calcium carbonate 1500 (600 Ca) MG Tabs tablet Commonly known as: OSCAL Take 1,500 mg by mouth daily.   cetirizine 10 MG tablet Commonly known as: ZYRTEC Take 10 mg by mouth daily.   Eliquis 2.5 MG Tabs tablet Generic drug: apixaban TAKE 1 TABLET (2.5 MG TOTAL) BY MOUTH 2 (TWO) TIMES DAILY What changed: See the new instructions.   fluticasone 50 MCG/ACT nasal spray Commonly known as: New Alluwe  2 sprays into both nostrils daily as needed for allergies or rhinitis.   hydrALAZINE 25 MG tablet Commonly known as: APRESOLINE Take 25 mg by mouth 2 (two) times daily.   losartan 50 MG tablet Commonly known as: COZAAR Take 50 mg by mouth daily.   metoprolol succinate 25 MG 24 hr tablet Commonly known as: TOPROL-XL Take 1 tablet (25 mg total) by mouth daily. What changed: when to take this   montelukast 10 MG tablet Commonly known as: SINGULAIR Take 10 mg by mouth daily.   multivitamin tablet Take 1 tablet by mouth daily.   omeprazole 40 MG capsule Commonly known as: PRILOSEC Take 40 mg by mouth daily.   pramipexole 0.25 MG tablet Commonly known as: MIRAPEX Take 0.25 mg by mouth 2 (two) times daily.   Ventolin HFA 108 (90 Base) MCG/ACT inhaler Generic drug: albuterol Inhale 1-2 puffs into the lungs every 6 (six) hours as needed for wheezing.          Time coordinating discharge: 35 min  Signed:  Geradine Girt DO  [facility] 02/16/2021, 12:53 PM

## 2021-02-16 NOTE — Evaluation (Signed)
Physical Therapy Evaluation Patient Details Name: Judith Boone MRN: [id number] DOB: [birth year] Today's Date: 02/16/2021  History of Present Illness  Judith Boone is a 87 y.o. female who presents with low H/H. PMH: COPD, GERD, HLD, HTN, CKD 3, pAF, esophageal web s/p EGD dilation   Clinical Impression  Pt functioning near baseline. Pt with noted SOB and 3/4 DOE with amb of 120' with RW. Discussed at length with pt and son regarding energy conservation, handout provided, to improve safety with function at home. Son reports he can spend the night tonight and then her dtr will be coming tomorrow to stay for a few days. SpO2 >88% on RA t/o session. Acute PT to cont to follow.       Recommendations for follow up therapy are one component of a multi-disciplinary discharge planning process, led by the attending physician.  Recommendations may be updated based on patient status, additional functional criteria and insurance authorization.  Follow Up Recommendations No PT follow up;Supervision/Assistance - 24 hour (24/7 initially)    Equipment Recommendations  None recommended by PT (has RW)    Recommendations for Other Services       Precautions / Restrictions Precautions Precautions: Fall Precaution Comments: gets SOB      Mobility  Bed Mobility               General bed mobility comments: pt up in chair upon PT arrival    Transfers Overall transfer level: Needs assistance Equipment used: None Transfers: Sit to/from Stand Sit to Stand: Supervision         General transfer comment: pt with good hand placement, no physical assist needed  Ambulation/Gait Ambulation/Gait assistance: Min guard Gait Distance (Feet): 120 Feet Assistive device: Rolling walker (2 wheeled) Gait Pattern/deviations: Step-through pattern;Decreased stride length;Trunk flexed Gait velocity: dec Gait velocity interpretation: <1.31 ft/sec, indicative of household ambulator General Gait Details: pt  with noted SOB, SpO2 at 88% on RA, recovered quickly to 94% within a minute, freq cues to stay in walker as pt with tendency to push walker too far out in front of self  Stairs            Wheelchair Mobility    Modified Rankin (Stroke Patients Only)       Balance Overall balance assessment: Mild deficits observed, not formally tested                                           Pertinent Vitals/Pain Pain Assessment: No/denies pain    Home Living Family/patient expects to be discharged to:: Private residence Living Arrangements: Alone Available Help at Discharge: Family;Available PRN/intermittently (son to stay the night tonight and daughter coming in for a few days) Type of Home: House Home Access: Level entry     Home Layout: One level Home Equipment: Environmental consultant - 4 wheels Additional Comments: pt lives alone, son lives 30 min away, dtr coming in to stay a few days, has a cleaning lady    Prior Function Level of Independence: Independent with assistive device(s)         Comments: amb with rollator at night and outside, pt reports she struggles with taking the trash out, spoke with son and pt regarding having someone do it     Hand Dominance   Dominant Hand: Right    Extremity/Trunk Assessment   Upper Extremity Assessment Upper Extremity Assessment: Generalized  weakness    Lower Extremity Assessment Lower Extremity Assessment: Generalized weakness    Cervical / Trunk Assessment Cervical / Trunk Assessment: Kyphotic  Communication   Communication: HOH  Cognition Arousal/Alertness: Awake/alert Behavior During Therapy: WFL for tasks assessed/performed Overall Cognitive Status: Within Functional Limits for tasks assessed                                        General Comments General comments (skin integrity, edema, etc.): SpO2 >88% on RA    Exercises     Assessment/Plan    PT Assessment Patient needs continued PT  services  PT Problem List Decreased strength;Decreased activity tolerance;Decreased balance;Decreased mobility;Decreased knowledge of use of DME;Decreased safety awareness       PT Treatment Interventions Gait training;DME instruction;Functional mobility training;Therapeutic activities;Therapeutic exercise;Balance training    PT Goals (Current goals can be found in the Care Plan section)  Acute Rehab PT Goals Patient Stated Goal: home today PT Goal Formulation: With patient Time For Goal Achievement: 03/02/21 Potential to Achieve Goals: Good    Frequency Min 2X/week   Barriers to discharge Decreased caregiver support family to provide 24/7 for the next few days    Co-evaluation               AM-PAC PT "6 Clicks" Mobility  Outcome Measure Help needed turning from your back to your side while in a flat bed without using bedrails?: None Help needed moving from lying on your back to sitting on the side of a flat bed without using bedrails?: None Help needed moving to and from a bed to a chair (including a wheelchair)?: A Little Help needed standing up from a chair using your arms (e.g., wheelchair or bedside chair)?: A Little Help needed to walk in hospital room?: A Little Help needed climbing 3-5 steps with a railing? : A Little 6 Click Score: 20    End of Session Equipment Utilized During Treatment: Gait belt Activity Tolerance: Patient limited by fatigue Patient left: in chair;with call bell/phone within reach;with family/visitor present Nurse Communication: Mobility status PT Visit Diagnosis: Muscle weakness (generalized) (M62.81);Difficulty in walking, not elsewhere classified (R26.2)    Time: 3009-2330 PT Time Calculation (min) (ACUTE ONLY): 29 min   Charges:   PT Evaluation $PT Eval Low Complexity: 1 Low PT Treatments $Gait Training: 8-22 mins        Kittie Plater, PT, DPT Acute Rehabilitation Services Pager #: [phone] Office #: [phone]   Berline Lopes 02/16/2021, 12:26 PM

## 2021-02-17 LAB — TYPE AND SCREEN
ABO/RH(D): O NEG
Antibody Screen: NEGATIVE
Unit division: 0
Unit division: 0

## 2021-02-17 LAB — BPAM RBC
Blood Product Expiration Date: 202210252359
Blood Product Expiration Date: 202210272359
ISSUE DATE / TIME: 202210212219
ISSUE DATE / TIME: 202210220113
Unit Type and Rh: 9500
Unit Type and Rh: 9500

## 2021-02-22 DIAGNOSIS — D649 Anemia, unspecified: Secondary | ICD-10-CM | POA: Diagnosis not present

## 2021-02-22 DIAGNOSIS — I4891 Unspecified atrial fibrillation: Secondary | ICD-10-CM | POA: Diagnosis not present

## 2021-02-22 DIAGNOSIS — Z1389 Encounter for screening for other disorder: Secondary | ICD-10-CM | POA: Diagnosis not present

## 2021-02-22 DIAGNOSIS — N184 Chronic kidney disease, stage 4 (severe): Secondary | ICD-10-CM | POA: Diagnosis not present

## 2021-02-22 DIAGNOSIS — M810 Age-related osteoporosis without current pathological fracture: Secondary | ICD-10-CM | POA: Diagnosis not present

## 2021-02-22 DIAGNOSIS — I129 Hypertensive chronic kidney disease with stage 1 through stage 4 chronic kidney disease, or unspecified chronic kidney disease: Secondary | ICD-10-CM | POA: Diagnosis not present

## 2021-02-22 DIAGNOSIS — J449 Chronic obstructive pulmonary disease, unspecified: Secondary | ICD-10-CM | POA: Diagnosis not present

## 2021-02-22 DIAGNOSIS — D6869 Other thrombophilia: Secondary | ICD-10-CM | POA: Diagnosis not present

## 2021-02-22 DIAGNOSIS — Q394 Esophageal web: Secondary | ICD-10-CM | POA: Diagnosis not present

## 2021-02-22 DIAGNOSIS — Z1331 Encounter for screening for depression: Secondary | ICD-10-CM | POA: Diagnosis not present

## 2021-02-22 DIAGNOSIS — Z23 Encounter for immunization: Secondary | ICD-10-CM | POA: Diagnosis not present

## 2021-02-22 DIAGNOSIS — E611 Iron deficiency: Secondary | ICD-10-CM | POA: Diagnosis not present

## 2021-02-22 DIAGNOSIS — I10 Essential (primary) hypertension: Secondary | ICD-10-CM | POA: Diagnosis not present

## 2021-02-22 DIAGNOSIS — Z0000 Encounter for general adult medical examination without abnormal findings: Secondary | ICD-10-CM | POA: Diagnosis not present

## 2021-02-22 DIAGNOSIS — I700 Atherosclerosis of aorta: Secondary | ICD-10-CM | POA: Diagnosis not present

## 2021-02-22 DIAGNOSIS — D692 Other nonthrombocytopenic purpura: Secondary | ICD-10-CM | POA: Diagnosis not present

## 2021-02-26 ENCOUNTER — Other Ambulatory Visit (HOSPITAL_COMMUNITY): Payer: Self-pay | Admitting: *Deleted

## 2021-02-27 ENCOUNTER — Encounter (HOSPITAL_COMMUNITY)
Admission: RE | Admit: 2021-02-27 | Discharge: 2021-02-27 | Disposition: A | Discharge status: Home / Self Care | Payer: Medicare Other | Source: Ambulatory Visit | Attending: Internal Medicine | Admitting: Internal Medicine

## 2021-02-27 ENCOUNTER — Other Ambulatory Visit: Payer: Self-pay

## 2021-02-27 DIAGNOSIS — D649 Anemia, unspecified: Secondary | ICD-10-CM | POA: Insufficient documentation

## 2021-02-27 MED ORDER — SODIUM CHLORIDE 0.9 % IV SOLN
510.0000 mg | INTRAVENOUS | Status: DC
  Administered 2021-02-27: 510 mg via INTRAVENOUS
  Filled 2021-02-27: qty 510

## 2021-02-27 NOTE — Progress Notes (Signed)
Confirmed patient was given IV feraheme as IP last week.  Called Dr [facility] office and clarified feraheme order as outpatient.  Per Estill Bamberg he wants a total of 2 doses so if she got the first as an IP then just do one as an OP.  Pt made aware and verbalized understanding.

## 2021-03-06 ENCOUNTER — Encounter (HOSPITAL_COMMUNITY): Payer: Medicare Other

## 2021-03-08 DIAGNOSIS — N184 Chronic kidney disease, stage 4 (severe): Secondary | ICD-10-CM | POA: Diagnosis not present

## 2021-03-08 DIAGNOSIS — I129 Hypertensive chronic kidney disease with stage 1 through stage 4 chronic kidney disease, or unspecified chronic kidney disease: Secondary | ICD-10-CM | POA: Diagnosis not present

## 2021-03-08 DIAGNOSIS — I4891 Unspecified atrial fibrillation: Secondary | ICD-10-CM | POA: Diagnosis not present

## 2021-03-08 DIAGNOSIS — D649 Anemia, unspecified: Secondary | ICD-10-CM | POA: Diagnosis not present

## 2021-03-08 DIAGNOSIS — D6869 Other thrombophilia: Secondary | ICD-10-CM | POA: Diagnosis not present

## 2021-03-08 DIAGNOSIS — E611 Iron deficiency: Secondary | ICD-10-CM | POA: Diagnosis not present

## 2021-03-13 ENCOUNTER — Ambulatory Visit (INDEPENDENT_AMBULATORY_CARE_PROVIDER_SITE_OTHER): Payer: Medicare Other | Admitting: Internal Medicine

## 2021-03-13 ENCOUNTER — Encounter: Payer: Self-pay | Admitting: Internal Medicine

## 2021-03-13 VITALS — BP 160/68 | HR 81 | Ht 60.0 in | Wt 134.0 lb

## 2021-03-13 DIAGNOSIS — Z7901 Long term (current) use of anticoagulants: Secondary | ICD-10-CM

## 2021-03-13 DIAGNOSIS — D509 Iron deficiency anemia, unspecified: Secondary | ICD-10-CM

## 2021-03-13 DIAGNOSIS — Z9981 Dependence on supplemental oxygen: Secondary | ICD-10-CM | POA: Diagnosis not present

## 2021-03-13 DIAGNOSIS — I480 Paroxysmal atrial fibrillation: Secondary | ICD-10-CM

## 2021-03-13 DIAGNOSIS — K449 Diaphragmatic hernia without obstruction or gangrene: Secondary | ICD-10-CM | POA: Diagnosis not present

## 2021-03-13 NOTE — Patient Instructions (Signed)
If you are age 85 or older, your body mass index should be between 23-30. Your Body mass index is 26.17 kg/m. If this is out of the aforementioned range listed, please consider follow up with your Primary Care Provider.  If you are age 73 or younger, your body mass index should be between 19-25. Your Body mass index is 26.17 kg/m. If this is out of the aformentioned range listed, please consider follow up with your Primary Care Provider.   ________________________________________________________  The [facility] providers would like to encourage you to use [facility] to communicate with providers for non-urgent requests or questions.  Due to long hold times on the telephone, sending your provider a message by [facility] may be a faster and more efficient way to get a response.  Please allow 48 business hours for a response.  Please remember that this is for non-urgent requests.  _______________________________________________________   We will see you again in a month to re-evaluate.   I appreciate the opportunity to care for you. Silvano Rusk, MD, [facility]

## 2021-03-13 NOTE — Progress Notes (Signed)
Judith Boone 87 y.o. 1932-04-15 [id number]  Assessment & Plan:   Encounter Diagnoses  Name Primary?   Iron deficiency anemia, unspecified iron deficiency anemia type Yes   Hiatal hernia    Paroxysmal atrial fibrillation (HCC)    Long term current use of anticoagulant    On home oxygen therapy     It seems quite likely that her large hiatal hernia can be a source for her iron deficiency anemia.  She could certainly have Cameron erosions.  Use of anticoagulant would exacerbate this as well.  She is 56 she is somewhat frail.  She and I are both ambivalent about endoscopic procedures especially colonoscopy.  She and I are both aware that we cannot exclude colon cancer at this point without a colonoscopy and she could also have an upper GI tract malignancy.  We discussed possibly doing only an EGD to start and if that proved Cameron erosions stopping there.  She is on oxygen therapy and it would be best she would need to hold her anticoagulation for an EGD though may be not 100%.  As we talked about this we have decided to revisit things in about a month.  04/17/2021 is her next appointment with me and we will determine if we are going to to pursue an endoscopic evaluation.  She is clearly at higher risk of complications of procedures.  CC: Velna Hatchet, MD  Subjective:   Chief Complaint: Iron deficiency anemia that is new, chronic pill dysphagia  HPI This 87 year old white woman was recently hospitalized in late October with a new iron deficiency anemia in the background of a chronic anemia.  She was Hemoccult negative.  She has a past medical history that includes COPD and part-time home oxygen use as needed, paroxysmal atrial fibrillation on Eliquis, chronic pill dysphagia, hiatal hernia and prior dilation of esophageal web years ago (Dr. Earlean Shawl), and chronic kidney disease.  She was found to have a hemoglobin of 6.2 3 weeks ago and her ferritin was 6.  B12 level was over 4000.   She was transfused 2 units of packed red cells and discharged for outpatient GI follow-up.  She has received 1 if not 2 Feraheme infusions.  Follow-up hemoglobin was 8.7 the day of discharge on 10/22.  She feels stronger.  She is not and has not seen any sign of GI bleeding.  On February 22, 2021 her hemoglobin was up to 10.8.  In 2021 her hemoglobin was 10.5 with a normal MCV  CT of the chest in 2021 shows a moderate-large hiatal hernia in addition to emphysema and chronic granulomatous disease.  Upper GI series 2020 with 50% of the stomach contained in the chest some tertiary contractions in the esophagus and inability to swallow a 13 mm barium tablet.  Last EGD was 2002 with dilation up to 15 mm.  Cervical esophageal web.  Last colonoscopy prior to that we think.  I have reviewed the October hospitalization records as well as the follow-up office visit with Dr. Ardeth Perfect and associated labs as reflected above. Allergies  Allergen Reactions   Bacitracin     unknown   Neosporin [Neomycin-Bacitracin Zn-Polymyx]     unknown   Polysporin [Bacitracin-Polymyxin B]     unknown   Current Meds  Medication Sig   acetaminophen (TYLENOL) 500 MG tablet Take 1,500 mg by mouth every 6 (six) hours as needed for mild pain.   amLODipine (NORVASC) 5 MG tablet Take 5 mg by mouth at bedtime.  ANORO ELLIPTA 62.5-25 MCG/INH AEPB Inhale 1 puff into the lungs daily.   ascorbic acid (VITAMIN C) 500 MG tablet Take 500 mg by mouth daily.   atorvastatin (LIPITOR) 20 MG tablet Take 20 mg by mouth at bedtime.    calcium carbonate (OSCAL) 1500 (600 Ca) MG TABS tablet Take 1,500 mg by mouth daily.   cetirizine (ZYRTEC) 10 MG tablet Take 10 mg by mouth daily.   ELIQUIS 2.5 MG TABS tablet TAKE 1 TABLET (2.5 MG TOTAL) BY MOUTH 2 (TWO) TIMES DAILY (Patient taking differently: Take 2.5 mg by mouth 2 (two) times daily.)   fluticasone (FLONASE) 50 MCG/ACT nasal spray Place 2 sprays into both nostrils daily as needed for  allergies or rhinitis.   hydrALAZINE (APRESOLINE) 25 MG tablet Take 25 mg by mouth 2 (two) times daily.    losartan (COZAAR) 50 MG tablet Take 50 mg by mouth daily.   metoprolol succinate (TOPROL-XL) 25 MG 24 hr tablet Take 1 tablet (25 mg total) by mouth daily. (Patient taking differently: Take 25 mg by mouth at bedtime.)   montelukast (SINGULAIR) 10 MG tablet Take 10 mg by mouth daily.   Multiple Vitamin (MULTIVITAMIN) tablet Take 1 tablet by mouth daily.   omeprazole (PRILOSEC) 40 MG capsule Take 40 mg by mouth daily.    OXYGEN Inhale into the lungs. As needed   pramipexole (MIRAPEX) 0.25 MG tablet Take 0.25 mg by mouth 2 (two) times daily.   Spacer/Aero-Holding Chambers (AEROCHAMBER MV) inhaler Use as instructed with Albuterol HFA   VENTOLIN HFA 108 (90 BASE) MCG/ACT inhaler Inhale 1-2 puffs into the lungs every 6 (six) hours as needed for wheezing.    Past Medical History:  Diagnosis Date   Allergy    allergic rhinitis   Arthritis    Basal cell carcinoma    on  her face   Cellulitis of left leg 07/28/2014   hospitalized for 4 days   Chronic kidney disease    COPD (chronic obstructive pulmonary disease) (HCC)    Esophageal web    GERD (gastroesophageal reflux disease)    History of granulomatous disease    Hyperlipidemia    Hypertension    Left leg swelling    Paroxysmal atrial fibrillation (HCC)    Right-sided low back pain with sciatica    Past Surgical History:  Procedure Laterality Date   ABDOMINAL HYSTERECTOMY     broken ankle repair     left   CATARACT EXTRACTION     TOTAL KNEE ARTHROPLASTY     right   Social History   Social History Narrative   Patient is widowed.   She has 1 son and 1 daughter   1-2 alcoholic beverages a week 1 caffeinated beverage daily she is a former smoker not now      family history includes Alcohol abuse in her father; Asthma in her son; Heart disease in her father; Hypertension in her daughter, father, and son.   Review of  Systems Positive for some cough fatigue and dyspnea on exertion and myalgia at times.  All other review of systems negative.  Objective:   Physical Exam @BP  (!) 160/68   Pulse 81   Ht 5' (1.524 m)   Wt 134 lb (60.8 kg)   SpO2 93%   BMI 26.17 kg/m @  General:  Frail, elderly ww NAD who ambulates using a walker Eyes:  anicteric. Chest:  Severely kyphotic Lungs: Clear to auscultation bilaterally. Heart:   S1S2, no rubs, murmurs, gallops. Abdomen:  soft, non-tender, no hepatosplenomegaly, hernia, or mass and BS+.    Extremities:   no edema, cyanosis or clubbing + OA changes  Neuro:  A&O x 3.  Psych:  appropriate mood and  Affect.   Data Reviewed:  See HPI

## 2021-03-25 ENCOUNTER — Other Ambulatory Visit (HOSPITAL_COMMUNITY): Payer: Self-pay | Admitting: *Deleted

## 2021-03-26 ENCOUNTER — Encounter (HOSPITAL_COMMUNITY)
Admission: RE | Admit: 2021-03-26 | Discharge: 2021-03-26 | Disposition: A | Discharge status: Home / Self Care | Payer: Medicare Other | Source: Ambulatory Visit | Attending: Internal Medicine | Admitting: Internal Medicine

## 2021-03-26 DIAGNOSIS — D649 Anemia, unspecified: Secondary | ICD-10-CM | POA: Diagnosis not present

## 2021-03-26 MED ORDER — SODIUM CHLORIDE 0.9 % IV SOLN
510.0000 mg | INTRAVENOUS | Status: DC
  Administered 2021-03-26: 510 mg via INTRAVENOUS
  Filled 2021-03-26: qty 17

## 2021-03-27 DIAGNOSIS — I10 Essential (primary) hypertension: Secondary | ICD-10-CM | POA: Diagnosis not present

## 2021-03-27 DIAGNOSIS — J449 Chronic obstructive pulmonary disease, unspecified: Secondary | ICD-10-CM | POA: Diagnosis not present

## 2021-03-27 DIAGNOSIS — E785 Hyperlipidemia, unspecified: Secondary | ICD-10-CM | POA: Diagnosis not present

## 2021-03-27 DIAGNOSIS — N1832 Chronic kidney disease, stage 3b: Secondary | ICD-10-CM | POA: Diagnosis not present

## 2021-04-08 DIAGNOSIS — H353211 Exudative age-related macular degeneration, right eye, with active choroidal neovascularization: Secondary | ICD-10-CM | POA: Diagnosis not present

## 2021-04-08 DIAGNOSIS — Z961 Presence of intraocular lens: Secondary | ICD-10-CM | POA: Diagnosis not present

## 2021-04-08 DIAGNOSIS — H35723 Serous detachment of retinal pigment epithelium, bilateral: Secondary | ICD-10-CM | POA: Diagnosis not present

## 2021-04-08 DIAGNOSIS — H353122 Nonexudative age-related macular degeneration, left eye, intermediate dry stage: Secondary | ICD-10-CM | POA: Diagnosis not present

## 2021-04-08 DIAGNOSIS — H35051 Retinal neovascularization, unspecified, right eye: Secondary | ICD-10-CM | POA: Diagnosis not present

## 2021-04-08 DIAGNOSIS — H35363 Drusen (degenerative) of macula, bilateral: Secondary | ICD-10-CM | POA: Diagnosis not present

## 2021-04-09 DIAGNOSIS — E611 Iron deficiency: Secondary | ICD-10-CM | POA: Diagnosis not present

## 2021-04-15 DIAGNOSIS — L821 Other seborrheic keratosis: Secondary | ICD-10-CM | POA: Diagnosis not present

## 2021-04-15 DIAGNOSIS — L570 Actinic keratosis: Secondary | ICD-10-CM | POA: Diagnosis not present

## 2021-04-15 DIAGNOSIS — Z85828 Personal history of other malignant neoplasm of skin: Secondary | ICD-10-CM | POA: Diagnosis not present

## 2021-04-17 ENCOUNTER — Ambulatory Visit (INDEPENDENT_AMBULATORY_CARE_PROVIDER_SITE_OTHER): Payer: Medicare Other | Admitting: Internal Medicine

## 2021-04-17 ENCOUNTER — Encounter: Payer: Self-pay | Admitting: Internal Medicine

## 2021-04-17 ENCOUNTER — Telehealth: Payer: Self-pay

## 2021-04-17 VITALS — BP 124/60 | HR 64 | Ht 60.0 in | Wt 135.0 lb

## 2021-04-17 DIAGNOSIS — D509 Iron deficiency anemia, unspecified: Secondary | ICD-10-CM

## 2021-04-17 DIAGNOSIS — Z9981 Dependence on supplemental oxygen: Secondary | ICD-10-CM

## 2021-04-17 DIAGNOSIS — K449 Diaphragmatic hernia without obstruction or gangrene: Secondary | ICD-10-CM | POA: Diagnosis not present

## 2021-04-17 DIAGNOSIS — Z7901 Long term (current) use of anticoagulants: Secondary | ICD-10-CM | POA: Diagnosis not present

## 2021-04-17 DIAGNOSIS — R1310 Dysphagia, unspecified: Secondary | ICD-10-CM

## 2021-04-17 DIAGNOSIS — I480 Paroxysmal atrial fibrillation: Secondary | ICD-10-CM | POA: Diagnosis not present

## 2021-04-17 NOTE — Telephone Encounter (Signed)
[facility] Pre-operative Risk Assessment     Request for surgical clearance:     Endoscopy Procedure  What type of surgery is being performed?     EGD  When is this surgery scheduled?     TBD, to be done at [facility] when we can get a date  What type of clearance is required ?   Pharmacy  Are there any medications that need to be held prior to surgery and how long? Eliquis, day before and day of EGD  Practice name and name of physician performing surgery?      [facility]  What is your office phone and fax number?      Phone- [phone]  [phone]  Anesthesia type (None, local, MAC, general) ?       [facility]

## 2021-04-17 NOTE — Progress Notes (Signed)
Judith Boone 87 y.o. 02/14/1932 [id number]  Assessment & Plan:   Encounter Diagnoses  Name Primary?   Iron deficiency anemia, unspecified iron deficiency anemia type Yes   Hiatal hernia    Long term current use of anticoagulant    Paroxysmal atrial fibrillation (HCC)    On home oxygen therapy    Dysphagia - pill >>>>food      Evaluate with EGD.  We talked about concomitant colonoscopy.  She is 89 and frail and clearly at higher risk of complications from colonoscopy.  It seems quite possible that large hiatal hernia is causing leakage of blood.  Plus she is having some dysphagia.  We will evaluate all of that and possibly dilate the esophagus depending upon what is seen.  The procedure needs to be done at the hospital because of her chronic illnesses and lung disease that requires intermittent oxygen use which increases her risk of sedation complications and requires the best safety net.  If the EGD is significantly unrevealing, we will consider an outpatient colonoscopy at another time.  She will hold her anticoagulation with Eliquis the day before and day of procedure.  We have this scheduled for May 07, 2021 at [facility]  The risks and benefits as well as alternatives of endoscopic procedure(s) have been discussed and reviewed. All questions answered. The patient agrees to proceed.  She is aware of the rationale for holding Eliquis and has done this in the past and is comfortable with the pros and cons of that.  I appreciate the opportunity to care for this patient. CC: Velna Hatchet, MD  Subjective:   Chief Complaint:  HPI  Patient is an 87 year old white woman originally seen by me in November for iron deficiency anemia, after being discharged from the hospital for "outpatient GI work-up".  She had a hemoglobin of 6 and iron deficiency detected see my note of November 16 for further details.  She was transfused and in the interim she has had parenteral iron  and her hemoglobin is improved to baseline level of 10 range.  She has chronic kidney disease as well and is thought to also have some chronic disease anemia.  She continues to have intermittent pill dysphagia and rare dysphagia to food.  Previous history of esophageal web dilated by Dr. Earlean Shawl.  Colonoscopy years ago.  At the time we talked in November the thought was let her improving get stronger and she now feels back to her baseline energy level.  She takes Eliquis for history of A. fib.  She has held that for spinal injections before.  She uses home oxygen intermittently though "not as much as I had been".  Hgb 10.2 12/13 10 11/11 - ferritin 857 iron sat 50% Allergies  Allergen Reactions   Bacitracin     unknown   Neosporin [Neomycin-Bacitracin Zn-Polymyx]     unknown   Polysporin [Bacitracin-Polymyxin B]     unknown   Current Meds  Medication Sig   acetaminophen (TYLENOL) 500 MG tablet Take 1,500 mg by mouth every 6 (six) hours as needed for mild pain.   amLODipine (NORVASC) 5 MG tablet Take 5 mg by mouth at bedtime.    ANORO ELLIPTA 62.5-25 MCG/INH AEPB Inhale 1 puff into the lungs daily.   ascorbic acid (VITAMIN C) 500 MG tablet Take 500 mg by mouth daily.   atorvastatin (LIPITOR) 20 MG tablet Take 20 mg by mouth at bedtime.    calcium carbonate (OSCAL) 1500 (600 Ca) MG  TABS tablet Take 1,500 mg by mouth daily.   cetirizine (ZYRTEC) 10 MG tablet Take 10 mg by mouth daily.   ELIQUIS 2.5 MG TABS tablet TAKE 1 TABLET (2.5 MG TOTAL) BY MOUTH 2 (TWO) TIMES DAILY (Patient taking differently: Take 2.5 mg by mouth 2 (two) times daily.)   fluticasone (FLONASE) 50 MCG/ACT nasal spray Place 2 sprays into both nostrils daily as needed for allergies or rhinitis.   hydrALAZINE (APRESOLINE) 25 MG tablet Take 25 mg by mouth 2 (two) times daily.    losartan (COZAAR) 50 MG tablet Take 50 mg by mouth daily.   metoprolol succinate (TOPROL-XL) 25 MG 24 hr tablet Take 1 tablet (25 mg total) by mouth  daily. (Patient taking differently: Take 25 mg by mouth at bedtime.)   montelukast (SINGULAIR) 10 MG tablet Take 10 mg by mouth daily.   Multiple Vitamin (MULTIVITAMIN) tablet Take 1 tablet by mouth daily.   omeprazole (PRILOSEC) 40 MG capsule Take 40 mg by mouth daily.    OXYGEN Inhale into the lungs. As needed   pramipexole (MIRAPEX) 0.25 MG tablet Take 0.25 mg by mouth 2 (two) times daily.   Spacer/Aero-Holding Chambers (AEROCHAMBER MV) inhaler Use as instructed with Albuterol HFA   VENTOLIN HFA 108 (90 BASE) MCG/ACT inhaler Inhale 1-2 puffs into the lungs every 6 (six) hours as needed for wheezing.    Past Medical History:  Diagnosis Date   Allergy    allergic rhinitis   Arthritis    Basal cell carcinoma    on  her face   Cellulitis of left leg 07/28/2014   hospitalized for 4 days   Chronic kidney disease    COPD (chronic obstructive pulmonary disease) (HCC)    Esophageal web    GERD (gastroesophageal reflux disease)    History of granulomatous disease    Hyperlipidemia    Hypertension    Iron deficiency anemia    Left leg swelling    Paroxysmal atrial fibrillation (HCC)    Right-sided low back pain with sciatica    Past Surgical History:  Procedure Laterality Date   ABDOMINAL HYSTERECTOMY     broken ankle repair     left   CATARACT EXTRACTION     TOTAL KNEE ARTHROPLASTY     right   Social History   Social History Narrative   Patient is widowed.   She has 1 son and 1 daughter   1-2 alcoholic beverages a week 1 caffeinated beverage daily she is a former smoker not now      family history includes Alcohol abuse in her father; Asthma in her son; Heart disease in her father; Hypertension in her daughter, father, and son.   Review of Systems See HPI  Objective:   Physical Exam BP 124/60    Pulse 64    Ht 5' (1.524 m)    Wt 135 lb (61.2 kg)    BMI 26.37 kg/m  Elderly kyphotic ww NA Lungs cta Cor irreg rhythm S1S2 Nl rate Abd soft NT Neuro a and o x 3

## 2021-04-17 NOTE — Patient Instructions (Addendum)
You have been scheduled for an endoscopy. Please follow written instructions given to you at your visit today. If you use inhalers (even only as needed), please bring them with you on the day of your procedure.  Instructions have been emailed to patient per her request. EGD to be done at [facility]   We will go ahead and get clearance for you to hold your Eliquis prior to your procedure.  I appreciate the opportunity to care for you. Silvano Rusk, MD, [facility]

## 2021-04-17 NOTE — Telephone Encounter (Signed)
° °  Primary Cardiologist: Atrial fibrillation Clinic  Clinical pharmacists have reviewed past medical history, labs, and medications as part of pre-operative protocol coverage. Judith Boone would be at acceptable risk for the planned procedure without further cardiovascular testing.   The following recommendations have been made:   Patient with diagnosis of A Fib on Eliquis for anticoagulation.     Procedure: EGD Date of procedure: TBD   CHA2DS2-VASc Score = 5  This indicates a 7.2% annual risk of stroke. The patient's score is based upon: CHF History: 0 HTN History: 1 Diabetes History: 0 Stroke History: 0 Vascular Disease History: 1 Age Score: 2 Gender Score: 1     CrCl 25 mL/min Platelet count 130K   Per office protocol, patient can hold Eliquis for 2 days prior to procedure.    I will route this recommendation to the requesting party via Epic fax function and remove from pre-op pool.  Please call with questions.  Lenna Sciara, NP 04/17/2021, 1:29 PM

## 2021-04-17 NOTE — Telephone Encounter (Signed)
Patient with diagnosis of A Fib on Eliquis for anticoagulation.    Procedure: EGD Date of procedure: TBD  CHA2DS2-VASc Score = 5  This indicates a 7.2% annual risk of stroke. The patient's score is based upon: CHF History: 0 HTN History: 1 Diabetes History: 0 Stroke History: 0 Vascular Disease History: 1 Age Score: 2 Gender Score: 1   CrCl 25 mL/min Platelet count 130K  Per office protocol, patient can hold Eliquis for 2 days prior to procedure.

## 2021-04-18 ENCOUNTER — Telehealth: Payer: Self-pay

## 2021-04-18 NOTE — Telephone Encounter (Signed)
I left Mrs Milligan a detailed message that she can hold her Eliquis the day before her procedure and the day of so hold it 05/06/2021 and 05/07/2021. Dr Carlean Purl will tell her when to restart it. I told her to call me back so I can confirm she got this message.

## 2021-04-19 NOTE — Telephone Encounter (Signed)
Mrs. Bain called me back and verbalized understanding to hold her blood thinner.

## 2021-04-23 ENCOUNTER — Ambulatory Visit (HOSPITAL_COMMUNITY)
Admission: RE | Admit: 2021-04-23 | Discharge: 2021-04-23 | Disposition: A | Discharge status: Home / Self Care | Payer: Medicare Other | Source: Ambulatory Visit | Attending: Physician Assistant | Admitting: Physician Assistant

## 2021-04-23 ENCOUNTER — Encounter (HOSPITAL_COMMUNITY): Payer: Self-pay | Admitting: Physician Assistant

## 2021-04-23 ENCOUNTER — Other Ambulatory Visit: Payer: Self-pay

## 2021-04-23 VITALS — BP 142/70 | HR 73 | Ht 60.0 in | Wt 130.0 lb

## 2021-04-23 DIAGNOSIS — I739 Peripheral vascular disease, unspecified: Secondary | ICD-10-CM | POA: Insufficient documentation

## 2021-04-23 DIAGNOSIS — D6869 Other thrombophilia: Secondary | ICD-10-CM | POA: Diagnosis not present

## 2021-04-23 DIAGNOSIS — J449 Chronic obstructive pulmonary disease, unspecified: Secondary | ICD-10-CM | POA: Diagnosis not present

## 2021-04-23 DIAGNOSIS — I480 Paroxysmal atrial fibrillation: Secondary | ICD-10-CM | POA: Insufficient documentation

## 2021-04-23 DIAGNOSIS — Z79899 Other long term (current) drug therapy: Secondary | ICD-10-CM | POA: Insufficient documentation

## 2021-04-23 DIAGNOSIS — Z7901 Long term (current) use of anticoagulants: Secondary | ICD-10-CM | POA: Diagnosis not present

## 2021-04-23 DIAGNOSIS — E785 Hyperlipidemia, unspecified: Secondary | ICD-10-CM | POA: Insufficient documentation

## 2021-04-23 DIAGNOSIS — I10 Essential (primary) hypertension: Secondary | ICD-10-CM | POA: Diagnosis not present

## 2021-04-23 NOTE — Progress Notes (Signed)
Primary Care Physician: Velna Hatchet, MD Primary Cardiologist: none Primary Electrophysiologist: none Referring Physician: [facility]   Judith Boone is a 87 y.o. female with a history of COPD, HTN, HLD, PVD and paroxysmal atrial fibrillation who presents for follow up in the [facility]. The patient was initially diagnosed with atrial fibrillation 07/17/18 after presenting to the ER with elevated heart rates on her Apple Watch. She was asymptomatic during the event. She was started on Eliquis for a CHADS2VASC score 5 and metoprolol.  Zio patch 03/2019 showed no afib with brief epidodes of atrial tach and occasional PACs and PVCs.   On follow up today, patient reports she has done well from a cardiac standpoint. She was seen by her PCP 01/2021 for her yearly physical and was found to be profoundly anemic. She was told to go to the ED for evaluation and was given PRBC x 2. She is pending a EGD with Dr Carlean Purl to evaluate hiatal hernia and possible upper GI bleed on 05/07/20. She remains on Eliquis and denies any symptoms of bleeding.   Today, she denies symptoms of palpitations, chest pain, shortness of breath, orthopnea, PND, dizziness, presyncope, syncope, snoring, daytime somnolence, bleeding, or neurologic sequela. The patient is tolerating medications without difficulties and is otherwise without complaint today.    Atrial Fibrillation Risk Factors:  she does not have symptoms or diagnosis of sleep apnea. she does not have a history of rheumatic fever. she does not have a history of alcohol use. The patient does not have a history of early familial atrial fibrillation or other arrhythmias.  she has a BMI of Body mass index is 25.39 kg/m.Marland Kitchen Filed Weights   04/23/21 1358  Weight: 59 kg      Family History  Problem Relation Age of Onset   Alcohol abuse Father    Heart disease Father    Hypertension Father    Hypertension Daughter    Hypertension  Son    Asthma Son      Atrial Fibrillation Management history:  Previous antiarrhythmic drugs: none Previous cardioversions: none Previous ablations: none CHADS2VASC score: 5 Anticoagulation history: Eliquis   Past Medical History:  Diagnosis Date   Allergy    allergic rhinitis   Arthritis    Basal cell carcinoma    on  her face   Cellulitis of left leg 07/28/2014   hospitalized for 4 days   Chronic kidney disease    COPD (chronic obstructive pulmonary disease) (HCC)    Esophageal web    GERD (gastroesophageal reflux disease)    History of granulomatous disease    Hyperlipidemia    Hypertension    Iron deficiency anemia    Left leg swelling    Paroxysmal atrial fibrillation (HCC)    Right-sided low back pain with sciatica    Past Surgical History:  Procedure Laterality Date   ABDOMINAL HYSTERECTOMY     broken ankle repair     left   CATARACT EXTRACTION     TOTAL KNEE ARTHROPLASTY     right    Current Outpatient Medications  Medication Sig Dispense Refill   acetaminophen (TYLENOL) 500 MG tablet Take 1,500 mg by mouth every 6 (six) hours as needed for mild pain.     amLODipine (NORVASC) 5 MG tablet Take 5 mg by mouth at bedtime.   5   ANORO ELLIPTA 62.5-25 MCG/INH AEPB Inhale 1 puff into the lungs daily.     ascorbic acid (VITAMIN  C) 500 MG tablet Take 500 mg by mouth daily.     atorvastatin (LIPITOR) 20 MG tablet Take 20 mg by mouth at bedtime.      calcium carbonate (OSCAL) 1500 (600 Ca) MG TABS tablet Take 1,500 mg by mouth daily.     cetirizine (ZYRTEC) 10 MG tablet Take 10 mg by mouth daily.     ELIQUIS 2.5 MG TABS tablet TAKE 1 TABLET (2.5 MG TOTAL) BY MOUTH 2 (TWO) TIMES DAILY 180 tablet 2   fluticasone (FLONASE) 50 MCG/ACT nasal spray Place 2 sprays into both nostrils daily as needed for allergies or rhinitis.     hydrALAZINE (APRESOLINE) 25 MG tablet Take 25 mg by mouth 2 (two) times daily.      losartan (COZAAR) 50 MG tablet Take 50 mg by mouth daily.      metoprolol succinate (TOPROL-XL) 25 MG 24 hr tablet Take 1 tablet (25 mg total) by mouth daily. 90 tablet 2   montelukast (SINGULAIR) 10 MG tablet Take 10 mg by mouth daily.     Multiple Vitamin (MULTIVITAMIN) tablet Take 1 tablet by mouth daily.     omeprazole (PRILOSEC) 40 MG capsule Take 40 mg by mouth daily.      OXYGEN Inhale into the lungs. As needed     pramipexole (MIRAPEX) 0.25 MG tablet Take 0.25 mg by mouth 2 (two) times daily.     Spacer/Aero-Holding Chambers (AEROCHAMBER MV) inhaler Use as instructed with Albuterol HFA 1 each 0   VENTOLIN HFA 108 (90 BASE) MCG/ACT inhaler Inhale 1-2 puffs into the lungs every 6 (six) hours as needed for wheezing.   3   No current facility-administered medications for this encounter.    Allergies  Allergen Reactions   Bacitracin     unknown   Neosporin [Neomycin-Bacitracin Zn-Polymyx]     unknown   Polysporin [Bacitracin-Polymyxin B]     unknown    Social History   Socioeconomic History   Marital status: Widowed    Spouse name: Not on file   Number of children: Not on file   Years of education: Not on file   Highest education level: Not on file  Occupational History   Not on file  Tobacco Use   Smoking status: Former    Packs/day: 2.00    Years: 30.00    Pack years: 60.00    Types: Cigarettes    Quit date: 04/29/1979    Years since quitting: 42.0   Smokeless tobacco: Never   Tobacco comments:    Former smoker 04/23/2021  Vaping Use   Vaping Use: Not on file  Substance and Sexual Activity   Alcohol use: Yes    Alcohol/week: 2.0 - 3.0 standard drinks    Types: 2 - 3 Glasses of wine per week    Comment: weekly   Drug use: No   Sexual activity: Not on file  Other Topics Concern   Not on file  Social History Narrative   Patient is widowed.   She has 1 son and 1 daughter   1-2 alcoholic beverages a week 1 caffeinated beverage daily she is a former smoker not now      Social Determinants of Systems developer Strain: Not on file  Food Insecurity: Not on file  Transportation Needs: Not on file  Physical Activity: Not on file  Stress: Not on file  Social Connections: Not on file  Intimate Partner Violence: Not on file     ROS- All systems  are reviewed and negative except as per the HPI above.  Physical Exam: Vitals:   04/23/21 1358  BP: (!) 142/70  Pulse: 73  Weight: 59 kg  Height: 5' (1.524 m)    GEN- The patient is a well appearing elderly female, alert and oriented x 3 today.   HEENT-head normocephalic, atraumatic, sclera clear, conjunctiva pink, hearing intact, trachea midline. Lungs- Clear to ausculation bilaterally, normal work of breathing Heart- Regular rate and rhythm, no murmurs, rubs or gallops  GI- soft, NT, ND, + BS Extremities- no clubbing, cyanosis, or edema MS- no significant deformity or atrophy Skin- no rash or lesion Psych- euthymic mood, full affect Neuro- strength and sensation are intact   Wt Readings from Last 3 Encounters:  04/23/21 59 kg  04/17/21 61.2 kg  03/13/21 60.8 kg    EKG today demonstrates  SR, 1st degree AV block Vent. rate 73 BPM PR interval 246 ms QRS duration 70 ms QT/QTcB 388/427 ms  Echo 10/12/18 demonstrated   1. The left ventricle has normal systolic function with an ejection fraction of 60-65%. The cavity size was normal. Left ventricular diastolic Doppler parameters are consistent with impaired relaxation.  2. The mitral valve is grossly normal.  3. The tricuspid valve is grossly normal.  4. The aortic valve was not well visualized. Aortic valve regurgitation is trivial by color flow Doppler. No stenosis of the aortic valve.  5. Normal LV function; mild diastolic dysfunction; trace AI; mild TR; mildly elevated pulmonary pressure.  Epic records are reviewed at length today  CHA2DS2-VASc Score = 5  The patient's score is based upon: CHF History: 0 HTN History: 1 Diabetes History: 0 Stroke History: 0 Vascular  Disease History: 1 Age Score: 2 Gender Score: 1       ASSESSMENT AND PLAN: 1. Paroxysmal Atrial Fibrillation/atrial tachycardia The patient's CHA2DS2-VASc score is 5, indicating a 7.2% annual risk of stroke.   Patient appears to be maintaining SR. Continue Eliquis 2.5 mg BID. (age, Cr) OK to hold for upper endoscopy as outlined by pharmacy in phone note 04/17/21. Continue metoprolol 25 mg daily Apple Watch for home monitoring.   2. Secondary Hypercoagulable State (ICD10:  D68.69) The patient is at significant risk for stroke/thromboembolism based upon her CHA2DS2-VASc Score of 5.  Continue Apixaban (Eliquis).   3. HTN Stable, no changes today.   Follow up in the AF clinic in 6 months.     [facility] [address] [place], [place] [address] [phone] 04/23/2021 2:22 PM

## 2021-04-25 ENCOUNTER — Encounter (HOSPITAL_COMMUNITY): Payer: Self-pay | Admitting: Internal Medicine

## 2021-04-26 DIAGNOSIS — N1832 Chronic kidney disease, stage 3b: Secondary | ICD-10-CM | POA: Diagnosis not present

## 2021-04-26 DIAGNOSIS — E785 Hyperlipidemia, unspecified: Secondary | ICD-10-CM | POA: Diagnosis not present

## 2021-04-26 DIAGNOSIS — I10 Essential (primary) hypertension: Secondary | ICD-10-CM | POA: Diagnosis not present

## 2021-04-26 DIAGNOSIS — J449 Chronic obstructive pulmonary disease, unspecified: Secondary | ICD-10-CM | POA: Diagnosis not present

## 2021-04-30 ENCOUNTER — Encounter: Payer: Self-pay | Admitting: Internal Medicine

## 2021-05-03 ENCOUNTER — Encounter: Payer: Self-pay | Admitting: Internal Medicine

## 2021-05-07 ENCOUNTER — Ambulatory Visit (HOSPITAL_COMMUNITY): Admission: RE | Admit: 2021-05-07 | Payer: Medicare Other | Source: Home / Self Care | Admitting: Internal Medicine

## 2021-05-07 SURGERY — ESOPHAGOGASTRODUODENOSCOPY (EGD) WITH PROPOFOL
Anesthesia: Monitor Anesthesia Care

## 2021-05-13 ENCOUNTER — Other Ambulatory Visit: Payer: Self-pay

## 2021-05-13 ENCOUNTER — Encounter: Payer: Self-pay | Admitting: Internal Medicine

## 2021-05-13 NOTE — Telephone Encounter (Signed)
Pt originally had to cancel her EGD due to having Covid. Pt is now scheduled for 07/29/2020 at [facility] at 8:30: Case Number [id number]: Pt made aware Previsit scheduled for 06/26/2021 At 10:00 Virtual: Pt made aware Pt verbalized understanding with all questions answered.   Please advise if pt needs a  NEW  clearance for Eliquis 2.5 MG BID

## 2021-05-27 DIAGNOSIS — H353211 Exudative age-related macular degeneration, right eye, with active choroidal neovascularization: Secondary | ICD-10-CM | POA: Diagnosis not present

## 2021-06-03 DIAGNOSIS — M25512 Pain in left shoulder: Secondary | ICD-10-CM | POA: Diagnosis not present

## 2021-06-03 DIAGNOSIS — M542 Cervicalgia: Secondary | ICD-10-CM | POA: Diagnosis not present

## 2021-06-03 DIAGNOSIS — M25522 Pain in left elbow: Secondary | ICD-10-CM | POA: Diagnosis not present

## 2021-06-03 DIAGNOSIS — M25532 Pain in left wrist: Secondary | ICD-10-CM | POA: Diagnosis not present

## 2021-06-04 DIAGNOSIS — M6281 Muscle weakness (generalized): Secondary | ICD-10-CM | POA: Diagnosis not present

## 2021-06-04 DIAGNOSIS — M542 Cervicalgia: Secondary | ICD-10-CM | POA: Diagnosis not present

## 2021-06-04 DIAGNOSIS — S161XXD Strain of muscle, fascia and tendon at neck level, subsequent encounter: Secondary | ICD-10-CM | POA: Diagnosis not present

## 2021-06-12 DIAGNOSIS — M6281 Muscle weakness (generalized): Secondary | ICD-10-CM | POA: Diagnosis not present

## 2021-06-12 DIAGNOSIS — S161XXD Strain of muscle, fascia and tendon at neck level, subsequent encounter: Secondary | ICD-10-CM | POA: Diagnosis not present

## 2021-06-12 DIAGNOSIS — M542 Cervicalgia: Secondary | ICD-10-CM | POA: Diagnosis not present

## 2021-06-19 DIAGNOSIS — M542 Cervicalgia: Secondary | ICD-10-CM | POA: Diagnosis not present

## 2021-06-19 DIAGNOSIS — M6281 Muscle weakness (generalized): Secondary | ICD-10-CM | POA: Diagnosis not present

## 2021-06-19 DIAGNOSIS — S161XXD Strain of muscle, fascia and tendon at neck level, subsequent encounter: Secondary | ICD-10-CM | POA: Diagnosis not present

## 2021-06-26 ENCOUNTER — Ambulatory Visit (AMBULATORY_SURGERY_CENTER): Payer: Self-pay

## 2021-06-26 ENCOUNTER — Other Ambulatory Visit: Payer: Self-pay

## 2021-06-26 VITALS — Ht 60.0 in | Wt 130.0 lb

## 2021-06-26 DIAGNOSIS — R1310 Dysphagia, unspecified: Secondary | ICD-10-CM

## 2021-06-26 DIAGNOSIS — D509 Iron deficiency anemia, unspecified: Secondary | ICD-10-CM

## 2021-06-26 DIAGNOSIS — M6281 Muscle weakness (generalized): Secondary | ICD-10-CM | POA: Diagnosis not present

## 2021-06-26 DIAGNOSIS — M542 Cervicalgia: Secondary | ICD-10-CM | POA: Diagnosis not present

## 2021-06-26 DIAGNOSIS — S161XXD Strain of muscle, fascia and tendon at neck level, subsequent encounter: Secondary | ICD-10-CM | POA: Diagnosis not present

## 2021-06-26 NOTE — Progress Notes (Signed)
No egg or soy allergy known to patient  ?No issues known to pt with past sedation with any surgeries or procedures ?Patient denies ever being told they had issues or difficulty with intubation  ?No FH of Malignant Hyperthermia ?Pt is not on diet pills ?Pt is not on home 02  ?Pt is not on blood thinners  ?Pt denies issues with constipation;  ?AFIB dx ?Pt is fully vaccinated for Covid x 2; ?t verbalized understanding  ?Due to the COVID-19 pandemic we are asking patients to follow certain guidelines in PV and the [place]   ?Pt aware of COVID protocols and [facility] guidelines  ?PV completed over the phone. Pt verified name, DOB, address and insurance during PV today.  ?Pt mailed instruction packet with copy of consent form to read and not return, and instructions.  ?Pt encouraged to call with questions or issues.  ?If pt has My chart, procedure instructions sent via My Chart  ? ?

## 2021-07-03 DIAGNOSIS — H353211 Exudative age-related macular degeneration, right eye, with active choroidal neovascularization: Secondary | ICD-10-CM | POA: Diagnosis not present

## 2021-07-10 DIAGNOSIS — S161XXD Strain of muscle, fascia and tendon at neck level, subsequent encounter: Secondary | ICD-10-CM | POA: Diagnosis not present

## 2021-07-10 DIAGNOSIS — M6281 Muscle weakness (generalized): Secondary | ICD-10-CM | POA: Diagnosis not present

## 2021-07-10 DIAGNOSIS — M542 Cervicalgia: Secondary | ICD-10-CM | POA: Diagnosis not present

## 2021-07-17 DIAGNOSIS — E611 Iron deficiency: Secondary | ICD-10-CM | POA: Diagnosis not present

## 2021-07-17 DIAGNOSIS — M542 Cervicalgia: Secondary | ICD-10-CM | POA: Diagnosis not present

## 2021-07-19 ENCOUNTER — Encounter (HOSPITAL_COMMUNITY): Payer: Self-pay | Admitting: Internal Medicine

## 2021-07-19 NOTE — Progress Notes (Signed)
Attempted to obtain medical history via telephone, unable to reach at this time. I left a voicemail to return pre surgical testing department's phone call.  

## 2021-07-29 ENCOUNTER — Ambulatory Visit (HOSPITAL_COMMUNITY)
Admission: RE | Admit: 2021-07-29 | Discharge: 2021-07-29 | Disposition: A | Discharge status: Home / Self Care | Payer: Medicare Other | Attending: Internal Medicine | Admitting: Internal Medicine

## 2021-07-29 ENCOUNTER — Ambulatory Visit (HOSPITAL_BASED_OUTPATIENT_CLINIC_OR_DEPARTMENT_OTHER): Payer: Medicare Other | Admitting: Certified Registered Nurse Anesthetist

## 2021-07-29 ENCOUNTER — Other Ambulatory Visit: Payer: Self-pay

## 2021-07-29 ENCOUNTER — Ambulatory Visit (HOSPITAL_COMMUNITY): Payer: Medicare Other | Admitting: Certified Registered Nurse Anesthetist

## 2021-07-29 ENCOUNTER — Encounter (HOSPITAL_COMMUNITY)
Admission: RE | Disposition: A | Discharge status: Home / Self Care | Payer: Self-pay | Source: Home / Self Care | Attending: Internal Medicine

## 2021-07-29 DIAGNOSIS — K2970 Gastritis, unspecified, without bleeding: Secondary | ICD-10-CM

## 2021-07-29 DIAGNOSIS — J449 Chronic obstructive pulmonary disease, unspecified: Secondary | ICD-10-CM | POA: Insufficient documentation

## 2021-07-29 DIAGNOSIS — Z7901 Long term (current) use of anticoagulants: Secondary | ICD-10-CM | POA: Diagnosis not present

## 2021-07-29 DIAGNOSIS — K2950 Unspecified chronic gastritis without bleeding: Secondary | ICD-10-CM | POA: Diagnosis not present

## 2021-07-29 DIAGNOSIS — R1310 Dysphagia, unspecified: Secondary | ICD-10-CM | POA: Diagnosis not present

## 2021-07-29 DIAGNOSIS — K319 Disease of stomach and duodenum, unspecified: Secondary | ICD-10-CM | POA: Insufficient documentation

## 2021-07-29 DIAGNOSIS — D509 Iron deficiency anemia, unspecified: Secondary | ICD-10-CM | POA: Diagnosis not present

## 2021-07-29 DIAGNOSIS — D649 Anemia, unspecified: Secondary | ICD-10-CM | POA: Diagnosis present

## 2021-07-29 DIAGNOSIS — D631 Anemia in chronic kidney disease: Secondary | ICD-10-CM | POA: Diagnosis not present

## 2021-07-29 DIAGNOSIS — N189 Chronic kidney disease, unspecified: Secondary | ICD-10-CM | POA: Diagnosis not present

## 2021-07-29 DIAGNOSIS — K449 Diaphragmatic hernia without obstruction or gangrene: Secondary | ICD-10-CM | POA: Diagnosis not present

## 2021-07-29 DIAGNOSIS — Z87891 Personal history of nicotine dependence: Secondary | ICD-10-CM | POA: Diagnosis not present

## 2021-07-29 DIAGNOSIS — R54 Age-related physical debility: Secondary | ICD-10-CM | POA: Insufficient documentation

## 2021-07-29 DIAGNOSIS — K3189 Other diseases of stomach and duodenum: Secondary | ICD-10-CM | POA: Diagnosis not present

## 2021-07-29 DIAGNOSIS — I129 Hypertensive chronic kidney disease with stage 1 through stage 4 chronic kidney disease, or unspecified chronic kidney disease: Secondary | ICD-10-CM | POA: Diagnosis not present

## 2021-07-29 DIAGNOSIS — K219 Gastro-esophageal reflux disease without esophagitis: Secondary | ICD-10-CM | POA: Insufficient documentation

## 2021-07-29 DIAGNOSIS — I4891 Unspecified atrial fibrillation: Secondary | ICD-10-CM | POA: Diagnosis not present

## 2021-07-29 DIAGNOSIS — K222 Esophageal obstruction: Secondary | ICD-10-CM | POA: Diagnosis not present

## 2021-07-29 DIAGNOSIS — R1319 Other dysphagia: Secondary | ICD-10-CM

## 2021-07-29 HISTORY — PX: BIOPSY: SHX5522

## 2021-07-29 HISTORY — PX: ESOPHAGOGASTRODUODENOSCOPY (EGD) WITH PROPOFOL: SHX5813

## 2021-07-29 HISTORY — PX: BALLOON DILATION: SHX5330

## 2021-07-29 SURGERY — ESOPHAGOGASTRODUODENOSCOPY (EGD) WITH PROPOFOL
Anesthesia: Monitor Anesthesia Care

## 2021-07-29 MED ORDER — LIDOCAINE 2% (20 MG/ML) 5 ML SYRINGE
INTRAMUSCULAR | Status: DC | PRN
  Administered 2021-07-29: 40 mg via INTRAVENOUS

## 2021-07-29 MED ORDER — SODIUM CHLORIDE 0.9 % IV SOLN: INTRAVENOUS | Status: DC

## 2021-07-29 MED ORDER — APIXABAN 2.5 MG PO TABS: ORAL_TABLET | ORAL | 2 refills | Status: DC

## 2021-07-29 MED ORDER — PROPOFOL 500 MG/50ML IV EMUL
INTRAVENOUS | Status: DC | PRN
  Administered 2021-07-29: 60 ug/kg/min via INTRAVENOUS

## 2021-07-29 MED ORDER — LACTATED RINGERS IV SOLN: INTRAVENOUS | Status: DC

## 2021-07-29 SURGICAL SUPPLY — 15 items

## 2021-07-29 NOTE — Transfer of Care (Signed)
Immediate Anesthesia Transfer of Care Note ? ?Patient: Judith Boone ? ?Procedure(s) Performed: ESOPHAGOGASTRODUODENOSCOPY (EGD) WITH PROPOFOL ?BIOPSY ?BALLOON DILATION ? ?Patient Location: PACU ? ?Anesthesia Type:MAC ? ?Level of Consciousness: awake ? ?Airway & Oxygen Therapy: Patient Spontanous Breathing ? ?Post-op Assessment: Report given to RN and Post -op Vital signs reviewed and stable ? ?Post vital signs: Reviewed and stable ? ?Last Vitals:  ?Vitals Value Taken Time  ?BP    ?Temp    ?Pulse 71 07/29/21 0851  ?Resp 17 07/29/21 0851  ?SpO2 98 % 07/29/21 0851  ?Vitals shown include unvalidated device data. ? ?Last Pain:  ?Vitals:  ? 07/29/21 0710  ?TempSrc: Oral  ?PainSc: 0-No pain  ?   ? ?  ? ?Complications: No notable events documented. ?

## 2021-07-29 NOTE — Anesthesia Preprocedure Evaluation (Signed)
Anesthesia Evaluation  ?Patient identified by MRN, date of birth, ID band ?Patient awake ? ? ? ?Reviewed: ?Allergy & Precautions, NPO status , Patient's Chart, lab work & pertinent test results ? ?History of Anesthesia Complications ?Negative for: history of anesthetic complications ? ?Airway ?Mallampati: IV ? ?TM Distance: >3 FB ?Neck ROM: Limited ? ?Mouth opening: Limited Mouth Opening ? Dental ? ?(+) Teeth Intact, Dental Advisory Given ?  ?Pulmonary ?shortness of breath and Long-Term Oxygen Therapy, neg sleep apnea, COPD,  COPD inhaler, neg recent URI, former smoker,  ?  ?breath sounds clear to auscultation ? ? ? ? ? ? Cardiovascular ?hypertension, Pt. on medications ?+ Peripheral Vascular Disease  ?+ dysrhythmias Atrial Fibrillation  ?Rhythm:Regular  ??1. The left ventricle has normal systolic function with an ejection  ?fraction of 60-65%. The cavity size was normal. Left ventricular diastolic  ?Doppler parameters are consistent with impaired relaxation.  ??2. The mitral valve is grossly normal.  ??3. The tricuspid valve is grossly normal.  ??4. The aortic valve was not well visualized. Aortic valve regurgitation  ?is trivial by color flow Doppler. No stenosis of the aortic valve.  ??5. Normal LV function; mild diastolic dysfunction; trace AI; mild TR;  ?mildly elevated pulmonary pressure ?  ?Neuro/Psych ?  ? GI/Hepatic ?Neg liver ROS, GERD  Medicated,  ?Endo/Other  ? ? Renal/GU ?CRFRenal diseaseLab Results ?     Component                Value               Date                 ?     CREATININE               1.51 (H)            02/16/2021           ?Lab Results ?     Component                Value               Date                 ?     K                        4.7                 02/16/2021           ?  ? ?  ?Musculoskeletal ? ? Abdominal ?  ?Peds ? Hematology ? ?(+) Blood dyscrasia, anemia , Lab Results ?     Component                Value               Date                  ?     WBC                      4.6                 02/16/2021           ?     HGB  8.7 (L)             02/16/2021           ?     HCT                      28.2 (L)            02/16/2021           ?     MCV                      75.6 (L)            02/16/2021           ?     PLT                      130 (L)             02/16/2021           ? ?eliquis for afib   ?Anesthesia Other Findings ? ? Reproductive/Obstetrics ? ?  ? ? ? ? ? ? ? ? ? ? ? ? ? ?  ?  ? ? ? ? ? ? ? ? ?Anesthesia Physical ?Anesthesia Plan ? ?ASA: 3 ? ?Anesthesia Plan: MAC  ? ?Post-op Pain Management: Minimal or no pain anticipated  ? ?Induction: Intravenous ? ?PONV Risk Score and Plan: 2 and Propofol infusion and Treatment may vary due to age or medical condition ? ?Airway Management Planned: Nasal Cannula, Natural Airway and Simple Face Mask ? ?Additional Equipment:  ? ?Intra-op Plan:  ? ?Post-operative Plan:  ? ?Informed Consent: I have reviewed the patients History and Physical, chart, labs and discussed the procedure including the risks, benefits and alternatives for the proposed anesthesia with the patient or authorized representative who has indicated his/her understanding and acceptance.  ? ? ? ?Dental advisory given ? ?Plan Discussed with: CRNA ? ?Anesthesia Plan Comments:   ? ? ? ? ? ? ?Anesthesia Quick Evaluation ? ?

## 2021-07-29 NOTE — Op Note (Signed)
[facility] ?Patient Name: Judith Boone ?Procedure Date: 07/29/2021 ?MRN: [id number] ?Attending MD: Gatha Mayer , MD ?Date of Birth: 08-24-1931 ?CSN: [id number] ?Age: 88 ?Admit Type: Outpatient ?Procedure:                Upper GI endoscopy ?Indications:              Iron deficiency anemia, Dysphagia ?Providers:                Gatha Mayer, MD, Kary Kos RN, RN, Charlean Merl  ?                          Purcell Nails, Merchant [employer] officer, Eliberto Ivory ?Referring MD:              ?Medicines:                Monitored Anesthesia Care ?Complications:            No immediate complications. ?Estimated Blood Loss:     Estimated blood loss was minimal. ?Procedure:                Pre-Anesthesia Assessment: ?                          - Prior to the procedure, a History and Physical  ?                          was performed, and patient medications and  ?                          allergies were reviewed. The patient's tolerance of  ?                          previous anesthesia was also reviewed. The risks  ?                          and benefits of the procedure and the sedation  ?                          options and risks were discussed with the patient.  ?                          All questions were answered, and informed consent  ?                          was obtained. Prior Anticoagulants: The patient  ?                          last took Eliquis (apixaban) 2 days prior to the  ?                          procedure. ASA Grade Assessment: III - A patient  ?                          with severe systemic disease. After reviewing the  ?  risks and benefits, the patient was deemed in  ?                          satisfactory condition to undergo the procedure. ?                          After obtaining informed consent, the endoscope was  ?                          passed under direct vision. Throughout the  ?                          procedure, the patient's blood pressure, pulse, and  ?                           oxygen saturations were monitored continuously. The  ?                          GIF-H190 ([id number]) Olympus endoscope was introduced  ?                          through the mouth, and advanced to the second part  ?                          of duodenum. The upper GI endoscopy was  ?                          accomplished without difficulty. The patient  ?                          tolerated the procedure well. ?Scope In: ?Scope Out: ?Findings: ?     Two benign-appearing, intrinsic moderate (circumferential scarring or  ?     stenosis; an endoscope may pass) stenoses were found at UES and GE  ?     junction. The stenoses were traversed. A TTS dilator was passed through  ?     the scope. Dilation with an 18-19-20 mm balloon dilator was performed to  ?     20 mm. The dilation site was examined and showed mild mucosal  ?     disruption. The UES was stenotic and there was a dilation effect from  ?     the scope seen upon w/drawl. Estimated blood loss was minimal. ?     A 12 cm (34-46 cm) hiatal hernia was present. ?     Patchy moderate inflammation characterized by erythema and friability  ?     was found in the gastric body and in the gastric antrum. Biopsies were  ?     taken with a cold forceps for histology. Verification of patient  ?     identification for the specimen was done. Estimated blood loss was  ?     minimal. ?     The examined duodenum was normal. ?     The cardia and gastric fundus were normal on retroflexion. ?     The exam was otherwise without abnormality. ?Impression:               - Benign-appearing esophageal  stenoses. Dilated. 20  ?                          mm GE junction and scope dilation UES stricture  ?                          9via scope) ?                          - 12 cm (34-46 cm hiatal hernia. ?                          - Gastritis. Biopsied. i think she is anemic from  ?                          this - friable, oozing ?                          - Normal examined duodenum. ?                           - The examination was otherwise normal. ?Moderate Sedation: ?     Not Applicable - Patient had care per Anesthesia. ?Recommendation:           - Patient has a contact number available for  ?                          emergencies. The signs and symptoms of potential  ?                          delayed complications were discussed with the  ?                          patient. Return to normal activities tomorrow.  ?                          Written discharge instructions were provided to the  ?                          patient. ?                          - Clear liquids x 1 hour then soft foods rest of  ?                          day. Start prior diet tomorrow. ?                          - Continue present medications. ?                          - Resume Eliquis (apixaban) at prior dose tomorrow. ?                          - Await pathology results. cc Dr. Ardeth Perfect ?Procedure Code(s):        ---  Professional --- ?                          [phone], Esophagogastroduodenoscopy, flexible,  ?                          transoral; with transendoscopic balloon dilation of  ?                          esophagus (less than 30 mm diameter) ?                          43239, 59, Esophagogastroduodenoscopy, flexible,  ?                          transoral; with biopsy, single or multiple ?Diagnosis Code(s):        --- Professional --- ?                          K22.2, Esophageal obstruction ?                          K44.9, Diaphragmatic hernia without obstruction or  ?                          gangrene ?                          K29.70, Gastritis, unspecified, without bleeding ?                          D50.9, Iron deficiency anemia, unspecified ?                          R13.10, Dysphagia, unspecified ?CPT copyright 2019 American Medical Association. All rights reserved. ?The codes documented in this report are preliminary and upon coder review may  ?be revised to meet current compliance requirements. ?Gatha Mayer,  MD ?07/29/2021 9:05:52 AM ?This report has been signed electronically. ?Number of Addenda: 0 ?

## 2021-07-29 NOTE — Discharge Instructions (Addendum)
I dilated the esophagus - at the top and bottom of the esophagus. ?There is the hiatal hernia and also inflammation in the stomach which I biopsied. I bet you leak blood from this inflammation (gastritis). ? ?Once I get results will call with additional recommendations for treatment. ? ? ?WAIT UNTIL TOMORROW TO RESTART ELIQUIS ? ?I appreciate the opportunity to care for you. ?Gatha Mayer, MD, Marval Regal ? ?YOU HAD AN ENDOSCOPIC PROCEDURE TODAY: Refer to the procedure report and other information in the discharge instructions given to you for any specific questions about what was found during the examination. If this information does not answer your questions, please call Dr. Celesta Aver office at [phone] to clarify.  ? ?YOU SHOULD EXPECT: Some feelings of bloating in the abdomen. Passage of more gas than usual. Walking can help get rid of the air that was put into your GI tract during the procedure and reduce the bloating. If you had a lower endoscopy (such as a colonoscopy or flexible sigmoidoscopy) you may notice spotting of blood in your stool or on the toilet paper. Some abdominal soreness may be present for a day or two, also. ? ?DIET:  Stay on clear liquids until 1000 AM and then soft foods today - and tomorrow may try normal consistency foods. ? ? ?ACTIVITY: Your care partner should take you home directly after the procedure. You should plan to take it easy, moving slowly for the rest of the day. You can resume normal activity the day after the procedure however YOU SHOULD NOT DRIVE, use power tools, machinery or perform tasks that involve climbing or major physical exertion for 24 hours (because of the sedation medicines used during the test).  ? ?SYMPTOMS TO REPORT IMMEDIATELY: ?A gastroenterologist can be reached at any hour. Please call [phone]  for any of the following symptoms:  ? ?Following upper endoscopy (EGD, EUS, ERCP, esophageal dilation) ?Vomiting of blood or coffee ground material  ?New,  significant abdominal pain  ?New, significant chest pain or pain under the shoulder blades  ?Painful or persistently difficult swallowing  ?New shortness of breath  ?Black, tarry-looking or red, bloody stools ? ? ? ? ? ?

## 2021-07-29 NOTE — H&P (Signed)
[facility] History and Physical ? ? ?Primary Care Physician:  Velna Hatchet, MD ? ? ?Reason for Procedure:   Anemia, dysphagia ? ?Plan:    EGD ? ? ? ? ?HPI: Judith Boone is a 88 y.o. female seen in 03/2021 as below - she is here for evaluation of anemia and dysphagia as outlined below. She tells me recent Hgb is improved and she feels better than in Dec. ? ?Iron deficiency anemia, unspecified iron deficiency anemia type Yes   ? Hiatal hernia    ? Long term current use of anticoagulant    ? Paroxysmal atrial fibrillation (HCC)    ? On home oxygen therapy    ? Dysphagia - pill >>>>food    ?  ?  ?  ?Evaluate with EGD.  We talked about concomitant colonoscopy.  She is 89 and frail and clearly at higher risk of complications from colonoscopy.  It seems quite possible that large hiatal hernia is causing leakage of blood.  Plus she is having some dysphagia.  We will evaluate all of that and possibly dilate the esophagus depending upon what is seen.  The procedure needs to be done at the hospital because of her chronic illnesses and lung disease that requires intermittent oxygen use which increases her risk of sedation complications and requires the best safety net. ?  ?If the EGD is significantly unrevealing, we will consider an outpatient colonoscopy at another time. ?  ?She will hold her anticoagulation with Eliquis the day before and day of procedure.  We have this scheduled for May 07, 2021 at [facility]. ?  ?The risks and benefits as well as alternatives of endoscopic procedure(s) have been discussed and reviewed. All questions answered. The patient agrees to proceed.  She is aware of the rationale for holding Eliquis and has done this in the past and is comfortable with the pros and cons of that. ?  ?I appreciate the opportunity to care for this patient. ?CC: Velna Hatchet, MD ?  ?Subjective:  ?  ?Chief Complaint: ?  ?HPI  ?Patient is an 88 year old white woman originally seen by me in  November for iron deficiency anemia, after being discharged from the hospital for "outpatient GI work-up".  She had a hemoglobin of 6 and iron deficiency detected see my note of November 16 for further details.  She was transfused and in the interim she has had parenteral iron and her hemoglobin is improved to baseline level of 10 range.  She has chronic kidney disease as well and is thought to also have some chronic disease anemia.  She continues to have intermittent pill dysphagia and rare dysphagia to food.  Previous history of esophageal web dilated by Dr. Earlean Shawl.  Colonoscopy years ago.  At the time we talked in November the thought was let her improving get stronger and she now feels back to her baseline energy level.  She takes Eliquis for history of A. fib.  She has held that for spinal injections before.  She uses home oxygen intermittently though "not as much as I had been". ?  ?Hgb 10.2 12/13 ?10 11/11 - ferritin 857 iron sat 50% ? ?Past Medical History:  ?Diagnosis Date  ? Arthritis   ? Basal cell carcinoma   ? on  her face  ? Blood transfusion without reported diagnosis 2022  ? Cataract   ? bilateral sx  ? Cellulitis of left leg 07/28/2014  ? hospitalized for 4 days  ? Chronic kidney disease   ?  COPD (chronic obstructive pulmonary disease) ([place])   ? Emphysema of lung ([place])   ? Esophageal web   ? GERD (gastroesophageal reflux disease)   ? History of granulomatous disease   ? Hyperlipidemia   ? on meds  ? Hypertension   ? on meds  ? Iron deficiency anemia   ? Left leg swelling   ? Paroxysmal atrial fibrillation (HCC)   ? Right-sided low back pain with sciatica   ? Seasonal allergies   ? ? ?Past Surgical History:  ?Procedure Laterality Date  ? ABDOMINAL HYSTERECTOMY  2000  ? ANKLE SURGERY Left 1996  ? APPENDECTOMY  1978  ? CATARACT EXTRACTION Bilateral 2013  ? TOTAL KNEE ARTHROPLASTY Right 2008  ? TOTAL KNEE ARTHROPLASTY Left 2012  ? ? ?Prior to Admission medications   ?Medication Sig Start Date End Date  Taking? Authorizing Provider  ?amLODipine (NORVASC) 5 MG tablet Take 5 mg by mouth at bedtime.  04/13/15  Yes [provider]  ?Celedonio Miyamoto 62.5-25 MCG/INH AEPB Inhale 1 puff into the lungs daily. 07/04/18  Yes [provider]  ?Ascorbic Acid (VITAMIN C PO) Take 1 tablet by mouth daily.   Yes [provider]  ?atorvastatin (LIPITOR) 20 MG tablet Take 20 mg by mouth at bedtime.    Yes [provider]  ?Calcium Citrate-Vitamin D (CALCIUM + D PO) Take 1 tablet by mouth 2 (two) times daily.   Yes [provider]  ?cetirizine (ZYRTEC) 10 MG tablet Take 10 mg by mouth daily as needed for allergies.   Yes [provider]  ?ELIQUIS 2.5 MG TABS tablet TAKE 1 TABLET (2.5 MG TOTAL) BY MOUTH 2 (TWO) TIMES DAILY 12/21/20  Yes Fenton, Clint R, PA  ?famotidine (PEPCID) 20 MG tablet Take 20 mg by mouth daily.   Yes [provider]  ?ferrous sulfate 325 (65 FE) MG tablet Take 325 mg by mouth daily.   Yes [provider]  ?hydrALAZINE (APRESOLINE) 25 MG tablet Take 25 mg by mouth 2 (two) times daily.    Yes [provider]  ?losartan (COZAAR) 50 MG tablet Take 50 mg by mouth daily. 08/21/20  Yes [provider]  ?metoprolol succinate (TOPROL-XL) 25 MG 24 hr tablet Take 1 tablet (25 mg total) by mouth daily. ?Patient taking differently: Take 25 mg by mouth at bedtime. 07/23/18  Yes Fenton, Clint R, PA  ?montelukast (SINGULAIR) 10 MG tablet Take 10 mg by mouth daily. 08/02/19  Yes [provider]  ?Multiple Vitamin (MULTIVITAMIN) tablet Take 1 tablet by mouth daily.   Yes [provider]  ?ofloxacin (OCUFLOX) 0.3 % ophthalmic solution Place 1 drop into the right eye See admin instructions. Instill 1 drop into right eye 4 times daily for 1 week every month starting on the day of the monthly eye injection 05/28/21  Yes [provider]  ?omeprazole (PRILOSEC) 40 MG capsule Take 40 mg by mouth daily.  07/22/18  Yes [provider]  ?pramipexole (MIRAPEX) 0.25 MG tablet Take 0.25 mg by mouth at bedtime. 02/04/21  Yes [provider]  ?VITAMIN D PO Take 1 tablet by mouth daily.   Yes [provider]  ?OXYGEN Inhale into the lungs. As needed    [provider]  ?Spacer/Aero-Holding Chambers (AEROCHAMBER MV) inhaler Use as instructed with Albuterol HFA 10/07/19   Parrett, Fonnie Mu, NP  ?VENTOLIN HFA 108 (90 BASE) MCG/ACT inhaler Inhale 1-2 puffs into the lungs every 6 (six) hours as needed for wheezing.  03/03/15   [provider]  ? ? ?Current Facility-Administered Medications  ?Medication Dose Route Frequency Provider Last Rate Last Admin  ? 0.9 %  sodium chloride infusion   Intravenous Continuous Gatha Mayer, MD      ? lactated ringers infusion   Intravenous Continuous Gatha Mayer, MD      ? ? ?Allergies as of 05/13/2021 - Review Complete 04/26/2021  ?Allergen Reaction Noted  ? Bacitracin  08/16/2014  ? Neosporin [neomycin-bacitracin zn-polymyx]  08/16/2014  ? Polysporin [bacitracin-polymyxin b]  08/16/2014  ? ? ?Family History  ?Problem Relation Age of Onset  ? Alcohol abuse Father   ? Heart disease Father   ? Hypertension Father   ? Hypertension Daughter   ? Hypertension Son   ? Asthma Son   ? Colon polyps Neg Hx   ? Colon cancer Neg Hx   ? Esophageal cancer Neg Hx   ? Rectal cancer Neg Hx   ? Stomach cancer Neg Hx   ? ? ?Social History  ? ?Socioeconomic History  ? Marital status: Widowed  ?  Spouse name: Not on file  ? Number of children: Not on file  ? Years of education: Not on file  ? Highest education level: Not on file  ?Occupational History  ? Not on file  ?Tobacco Use  ? Smoking status: Former  ?  Packs/day: 2.00  ?  Years: 30.00  ?  Pack years: 60.00  ?  Types: Cigarettes  ?  Quit date: 04/29/1979  ?  Years since quitting: 42.2  ? Smokeless tobacco: Never  ? Tobacco comments:  ?  Former smoker 04/23/2021  ?Vaping Use  ? Vaping Use: Never used  ?Substance and Sexual Activity  ?  Alcohol use: Yes  ?  Alcohol/week: 2.0 - 3.0 standard drinks  ?  Types: 2 - 3 Standard drinks or equivalent per week  ?  Comment: weekly  ? Drug use: No  ? Sexual activity: Not on file  ?Other Topics Concern

## 2021-07-29 NOTE — Anesthesia Postprocedure Evaluation (Signed)
Anesthesia Post Note ? ?Patient: Judith Boone ? ?Procedure(s) Performed: ESOPHAGOGASTRODUODENOSCOPY (EGD) WITH PROPOFOL ?BIOPSY ?BALLOON DILATION ? ?  ? ?Patient location during evaluation: Endoscopy ?Anesthesia Type: MAC ?Level of consciousness: awake and alert ?Pain management: pain level controlled ?Vital Signs Assessment: post-procedure vital signs reviewed and stable ?Respiratory status: spontaneous breathing, nonlabored ventilation, respiratory function stable and patient connected to nasal cannula oxygen ?Cardiovascular status: stable and blood pressure returned to baseline ?Postop Assessment: no apparent nausea or vomiting ?Anesthetic complications: no ? ? ?No notable events documented. ? ?Last Vitals:  ?Vitals:  ? 07/29/21 0910 07/29/21 0920  ?BP: (!) 146/60 (!) 156/58  ?Pulse: 69 71  ?Resp: 17 19  ?Temp:    ?SpO2: 94% 96%  ?  ?Last Pain:  ?Vitals:  ? 07/29/21 0920  ?TempSrc:   ?PainSc: 0-No pain  ? ? ?  ?  ?  ?  ?  ?  ? ?March Rummage Magalene Mclear ? ? ? ? ?

## 2021-07-30 ENCOUNTER — Encounter (HOSPITAL_COMMUNITY): Payer: Self-pay | Admitting: Internal Medicine

## 2021-07-31 LAB — SURGICAL PATHOLOGY

## 2021-08-12 DIAGNOSIS — H353211 Exudative age-related macular degeneration, right eye, with active choroidal neovascularization: Secondary | ICD-10-CM | POA: Diagnosis not present

## 2021-09-04 DIAGNOSIS — I10 Essential (primary) hypertension: Secondary | ICD-10-CM | POA: Diagnosis not present

## 2021-09-04 DIAGNOSIS — I129 Hypertensive chronic kidney disease with stage 1 through stage 4 chronic kidney disease, or unspecified chronic kidney disease: Secondary | ICD-10-CM | POA: Diagnosis not present

## 2021-09-04 DIAGNOSIS — D692 Other nonthrombocytopenic purpura: Secondary | ICD-10-CM | POA: Diagnosis not present

## 2021-09-04 DIAGNOSIS — I4891 Unspecified atrial fibrillation: Secondary | ICD-10-CM | POA: Diagnosis not present

## 2021-09-04 DIAGNOSIS — I700 Atherosclerosis of aorta: Secondary | ICD-10-CM | POA: Diagnosis not present

## 2021-09-04 DIAGNOSIS — D6869 Other thrombophilia: Secondary | ICD-10-CM | POA: Diagnosis not present

## 2021-09-04 DIAGNOSIS — N184 Chronic kidney disease, stage 4 (severe): Secondary | ICD-10-CM | POA: Diagnosis not present

## 2021-09-04 DIAGNOSIS — D649 Anemia, unspecified: Secondary | ICD-10-CM | POA: Diagnosis not present

## 2021-09-04 DIAGNOSIS — M6780 Other specified disorders of synovium and tendon, unspecified site: Secondary | ICD-10-CM | POA: Diagnosis not present

## 2021-09-04 DIAGNOSIS — J449 Chronic obstructive pulmonary disease, unspecified: Secondary | ICD-10-CM | POA: Diagnosis not present

## 2021-09-04 DIAGNOSIS — E611 Iron deficiency: Secondary | ICD-10-CM | POA: Diagnosis not present

## 2021-09-10 DIAGNOSIS — H35363 Drusen (degenerative) of macula, bilateral: Secondary | ICD-10-CM | POA: Diagnosis not present

## 2021-09-10 DIAGNOSIS — H353121 Nonexudative age-related macular degeneration, left eye, early dry stage: Secondary | ICD-10-CM | POA: Diagnosis not present

## 2021-09-10 DIAGNOSIS — D3131 Benign neoplasm of right choroid: Secondary | ICD-10-CM | POA: Diagnosis not present

## 2021-09-10 DIAGNOSIS — H35453 Secondary pigmentary degeneration, bilateral: Secondary | ICD-10-CM | POA: Diagnosis not present

## 2021-09-10 DIAGNOSIS — H353211 Exudative age-related macular degeneration, right eye, with active choroidal neovascularization: Secondary | ICD-10-CM | POA: Diagnosis not present

## 2021-09-10 DIAGNOSIS — H35723 Serous detachment of retinal pigment epithelium, bilateral: Secondary | ICD-10-CM | POA: Diagnosis not present

## 2021-09-10 DIAGNOSIS — Z961 Presence of intraocular lens: Secondary | ICD-10-CM | POA: Diagnosis not present

## 2021-09-16 DIAGNOSIS — H353211 Exudative age-related macular degeneration, right eye, with active choroidal neovascularization: Secondary | ICD-10-CM | POA: Diagnosis not present

## 2021-09-17 DIAGNOSIS — Z23 Encounter for immunization: Secondary | ICD-10-CM | POA: Diagnosis not present

## 2021-09-25 DIAGNOSIS — J449 Chronic obstructive pulmonary disease, unspecified: Secondary | ICD-10-CM | POA: Diagnosis not present

## 2021-09-25 DIAGNOSIS — N1832 Chronic kidney disease, stage 3b: Secondary | ICD-10-CM | POA: Diagnosis not present

## 2021-09-25 DIAGNOSIS — I130 Hypertensive heart and chronic kidney disease with heart failure and stage 1 through stage 4 chronic kidney disease, or unspecified chronic kidney disease: Secondary | ICD-10-CM | POA: Diagnosis not present

## 2021-10-04 NOTE — Patient Outreach (Signed)
Received a referral notification for Ms. Carmean. I have assigned Valente David, RN to call for follow up and determine if there are any Case Management needs.    [facility], [place], [place] Management [phone]

## 2021-10-07 ENCOUNTER — Other Ambulatory Visit: Payer: Self-pay | Admitting: *Deleted

## 2021-10-07 NOTE — Patient Outreach (Addendum)
[facility]) Care Management  10/07/2021  Judith Boone [birth year] [id number]   Referral Date: 6/9 Referral Source: Insurance Referral Reason: Chronic care management Insurance:  [place] attempt #1, successful.  Identity verified.  This care manager introduced self and stated purpose of call.  [facility] care management services explained.  She declines to participate.  Benefits discussed, encouraged to consider involvement.  Agrees to receive information on program in mail and follow up on decision.   Plan: RN CM will send outreach letter and follow up within 2 weeks on decision on engagement.  Valente David, RN, MSN, [place] Manager [phone]

## 2021-10-15 DIAGNOSIS — L570 Actinic keratosis: Secondary | ICD-10-CM | POA: Diagnosis not present

## 2021-10-15 DIAGNOSIS — D2262 Melanocytic nevi of left upper limb, including shoulder: Secondary | ICD-10-CM | POA: Diagnosis not present

## 2021-10-15 DIAGNOSIS — L821 Other seborrheic keratosis: Secondary | ICD-10-CM | POA: Diagnosis not present

## 2021-10-15 DIAGNOSIS — Z85828 Personal history of other malignant neoplasm of skin: Secondary | ICD-10-CM | POA: Diagnosis not present

## 2021-10-15 DIAGNOSIS — I788 Other diseases of capillaries: Secondary | ICD-10-CM | POA: Diagnosis not present

## 2021-10-15 DIAGNOSIS — D692 Other nonthrombocytopenic purpura: Secondary | ICD-10-CM | POA: Diagnosis not present

## 2021-10-22 ENCOUNTER — Ambulatory Visit (HOSPITAL_COMMUNITY)
Admission: RE | Admit: 2021-10-22 | Discharge: 2021-10-22 | Disposition: A | Discharge status: Home / Self Care | Payer: Medicare Other | Source: Ambulatory Visit | Attending: Physician Assistant | Admitting: Physician Assistant

## 2021-10-22 ENCOUNTER — Encounter (HOSPITAL_COMMUNITY): Payer: Self-pay | Admitting: Physician Assistant

## 2021-10-22 VITALS — BP 138/60 | HR 73 | Ht 60.0 in | Wt 130.0 lb

## 2021-10-22 DIAGNOSIS — Z7901 Long term (current) use of anticoagulants: Secondary | ICD-10-CM | POA: Insufficient documentation

## 2021-10-22 DIAGNOSIS — J449 Chronic obstructive pulmonary disease, unspecified: Secondary | ICD-10-CM | POA: Insufficient documentation

## 2021-10-22 DIAGNOSIS — D6869 Other thrombophilia: Secondary | ICD-10-CM | POA: Insufficient documentation

## 2021-10-22 DIAGNOSIS — D509 Iron deficiency anemia, unspecified: Secondary | ICD-10-CM | POA: Diagnosis not present

## 2021-10-22 DIAGNOSIS — I480 Paroxysmal atrial fibrillation: Secondary | ICD-10-CM | POA: Diagnosis not present

## 2021-10-22 DIAGNOSIS — E785 Hyperlipidemia, unspecified: Secondary | ICD-10-CM | POA: Diagnosis not present

## 2021-10-22 DIAGNOSIS — I10 Essential (primary) hypertension: Secondary | ICD-10-CM | POA: Diagnosis not present

## 2021-10-22 LAB — BASIC METABOLIC PANEL
Anion gap: 13 (ref 5–15)
BUN: 29 mg/dL — ABNORMAL HIGH (ref 8–23)
CO2: 18 mmol/L — ABNORMAL LOW (ref 22–32)
Calcium: 8.9 mg/dL (ref 8.9–10.3)
Chloride: 111 mmol/L (ref 98–111)
Creatinine, Ser: 1.52 mg/dL — ABNORMAL HIGH (ref 0.44–1.00)
GFR, Estimated: 33 mL/min — ABNORMAL LOW (ref >60)
Glucose, Bld: 137 mg/dL — ABNORMAL HIGH (ref 70–99)
Potassium: 4.6 mmol/L (ref 3.5–5.1)
Sodium: 142 mmol/L (ref 135–145)

## 2021-10-22 LAB — CBC
HCT: 32.9 % — ABNORMAL LOW (ref 36.0–46.0)
Hemoglobin: 10.5 g/dL — ABNORMAL LOW (ref 12.0–15.0)
MCH: 26.7 pg (ref 26.0–34.0)
MCHC: 31.9 g/dL (ref 30.0–36.0)
MCV: 83.7 fL (ref 80.0–100.0)
Platelets: 141 10*3/uL — ABNORMAL LOW (ref 150–400)
RBC: 3.93 MIL/uL (ref 3.87–5.11)
RDW: 21.9 % — ABNORMAL HIGH (ref 11.5–15.5)
WBC: 6.1 10*3/uL (ref 4.0–10.5)
nRBC: 0 % (ref 0.0–0.2)

## 2021-10-23 ENCOUNTER — Other Ambulatory Visit: Payer: Self-pay | Admitting: *Deleted

## 2021-10-23 DIAGNOSIS — H353211 Exudative age-related macular degeneration, right eye, with active choroidal neovascularization: Secondary | ICD-10-CM | POA: Diagnosis not present

## 2021-10-23 NOTE — Patient Outreach (Signed)
[facility]) Care Management  10/23/2021  Judith Boone [birth year] [id number]   Outgoing call placed to member to follow up on decision for [facility] engagement.  She declines to participate, will close case at this time.  Valente David, RN, MSN, [place] Manager [phone]

## 2021-11-27 DIAGNOSIS — H353211 Exudative age-related macular degeneration, right eye, with active choroidal neovascularization: Secondary | ICD-10-CM | POA: Diagnosis not present

## 2021-12-11 DIAGNOSIS — H52203 Unspecified astigmatism, bilateral: Secondary | ICD-10-CM | POA: Diagnosis not present

## 2021-12-11 DIAGNOSIS — Z961 Presence of intraocular lens: Secondary | ICD-10-CM | POA: Diagnosis not present

## 2021-12-11 DIAGNOSIS — H353211 Exudative age-related macular degeneration, right eye, with active choroidal neovascularization: Secondary | ICD-10-CM | POA: Diagnosis not present

## 2021-12-11 DIAGNOSIS — H0100A Unspecified blepharitis right eye, upper and lower eyelids: Secondary | ICD-10-CM | POA: Diagnosis not present

## 2021-12-16 DIAGNOSIS — Z9981 Dependence on supplemental oxygen: Secondary | ICD-10-CM | POA: Diagnosis not present

## 2021-12-16 DIAGNOSIS — Z1152 Encounter for screening for COVID-19: Secondary | ICD-10-CM | POA: Diagnosis not present

## 2021-12-16 DIAGNOSIS — R0981 Nasal congestion: Secondary | ICD-10-CM | POA: Diagnosis not present

## 2021-12-16 DIAGNOSIS — R5383 Other fatigue: Secondary | ICD-10-CM | POA: Diagnosis not present

## 2021-12-16 DIAGNOSIS — I130 Hypertensive heart and chronic kidney disease with heart failure and stage 1 through stage 4 chronic kidney disease, or unspecified chronic kidney disease: Secondary | ICD-10-CM | POA: Diagnosis not present

## 2021-12-16 DIAGNOSIS — J449 Chronic obstructive pulmonary disease, unspecified: Secondary | ICD-10-CM | POA: Diagnosis not present

## 2021-12-16 DIAGNOSIS — R051 Acute cough: Secondary | ICD-10-CM | POA: Diagnosis not present

## 2022-01-01 DIAGNOSIS — H353211 Exudative age-related macular degeneration, right eye, with active choroidal neovascularization: Secondary | ICD-10-CM | POA: Diagnosis not present

## 2022-01-08 DIAGNOSIS — H35363 Drusen (degenerative) of macula, bilateral: Secondary | ICD-10-CM | POA: Diagnosis not present

## 2022-01-08 DIAGNOSIS — H35723 Serous detachment of retinal pigment epithelium, bilateral: Secondary | ICD-10-CM | POA: Diagnosis not present

## 2022-01-08 DIAGNOSIS — H353122 Nonexudative age-related macular degeneration, left eye, intermediate dry stage: Secondary | ICD-10-CM | POA: Diagnosis not present

## 2022-01-08 DIAGNOSIS — Z961 Presence of intraocular lens: Secondary | ICD-10-CM | POA: Diagnosis not present

## 2022-01-08 DIAGNOSIS — H35453 Secondary pigmentary degeneration, bilateral: Secondary | ICD-10-CM | POA: Diagnosis not present

## 2022-01-08 DIAGNOSIS — D3131 Benign neoplasm of right choroid: Secondary | ICD-10-CM | POA: Diagnosis not present

## 2022-01-08 DIAGNOSIS — H353211 Exudative age-related macular degeneration, right eye, with active choroidal neovascularization: Secondary | ICD-10-CM | POA: Diagnosis not present

## 2022-01-21 DIAGNOSIS — Z23 Encounter for immunization: Secondary | ICD-10-CM | POA: Diagnosis not present

## 2022-02-04 DIAGNOSIS — Z23 Encounter for immunization: Secondary | ICD-10-CM | POA: Diagnosis not present

## 2022-02-05 DIAGNOSIS — H353211 Exudative age-related macular degeneration, right eye, with active choroidal neovascularization: Secondary | ICD-10-CM | POA: Diagnosis not present

## 2022-02-10 DIAGNOSIS — L03115 Cellulitis of right lower limb: Secondary | ICD-10-CM | POA: Diagnosis not present

## 2022-02-10 DIAGNOSIS — I872 Venous insufficiency (chronic) (peripheral): Secondary | ICD-10-CM | POA: Diagnosis not present

## 2022-02-16 ENCOUNTER — Encounter (HOSPITAL_BASED_OUTPATIENT_CLINIC_OR_DEPARTMENT_OTHER): Payer: Self-pay | Admitting: Emergency Medicine

## 2022-02-16 ENCOUNTER — Emergency Department (HOSPITAL_BASED_OUTPATIENT_CLINIC_OR_DEPARTMENT_OTHER): Payer: Medicare Other

## 2022-02-16 ENCOUNTER — Emergency Department (HOSPITAL_BASED_OUTPATIENT_CLINIC_OR_DEPARTMENT_OTHER)
Admission: EM | Admit: 2022-02-16 | Discharge: 2022-02-16 | Disposition: A | Discharge status: Home / Self Care | Payer: Medicare Other | Attending: Emergency Medicine | Admitting: Emergency Medicine

## 2022-02-16 ENCOUNTER — Other Ambulatory Visit: Payer: Self-pay

## 2022-02-16 DIAGNOSIS — Z7951 Long term (current) use of inhaled steroids: Secondary | ICD-10-CM | POA: Insufficient documentation

## 2022-02-16 DIAGNOSIS — J449 Chronic obstructive pulmonary disease, unspecified: Secondary | ICD-10-CM | POA: Diagnosis not present

## 2022-02-16 DIAGNOSIS — Z7901 Long term (current) use of anticoagulants: Secondary | ICD-10-CM | POA: Insufficient documentation

## 2022-02-16 DIAGNOSIS — I4891 Unspecified atrial fibrillation: Secondary | ICD-10-CM | POA: Diagnosis not present

## 2022-02-16 DIAGNOSIS — I878 Other specified disorders of veins: Secondary | ICD-10-CM | POA: Diagnosis not present

## 2022-02-16 DIAGNOSIS — Z79899 Other long term (current) drug therapy: Secondary | ICD-10-CM | POA: Diagnosis not present

## 2022-02-16 DIAGNOSIS — M7989 Other specified soft tissue disorders: Secondary | ICD-10-CM | POA: Diagnosis present

## 2022-02-16 DIAGNOSIS — I872 Venous insufficiency (chronic) (peripheral): Secondary | ICD-10-CM | POA: Diagnosis not present

## 2022-02-16 DIAGNOSIS — L03115 Cellulitis of right lower limb: Secondary | ICD-10-CM | POA: Insufficient documentation

## 2022-02-16 DIAGNOSIS — N189 Chronic kidney disease, unspecified: Secondary | ICD-10-CM | POA: Insufficient documentation

## 2022-02-16 DIAGNOSIS — L0390 Cellulitis, unspecified: Secondary | ICD-10-CM

## 2022-02-16 DIAGNOSIS — M25571 Pain in right ankle and joints of right foot: Secondary | ICD-10-CM | POA: Diagnosis not present

## 2022-02-16 MED ORDER — CLINDAMYCIN HCL 300 MG PO CAPS: 300.0000 mg | ORAL_CAPSULE | Freq: Three times a day (TID) | ORAL | 0 refills | Status: AC

## 2022-02-16 NOTE — ED Triage Notes (Signed)
Leg has swelling red in color blister on rt ankle

## 2022-02-16 NOTE — ED Provider Notes (Addendum)
[facility] EMERGENCY DEPARTMENT Provider Note   CSN: [id number] Arrival date & time: 02/16/22  0935     History  Chief Complaint  Patient presents with   Leg Swelling    Judith Boone is a 88 y.o. female.  Patient here with right lower leg redness and swelling.  Finished a course of antibiotics for possible cellulitis versus venous stasis process.  Developed a little blister in this area last day or 2.  Not sure if it is from her shoes.  Denies any fevers or chills.  History of atrial fibrillation on Eliquis.  History of venous stasis.  History of leg swelling.  History of CKD.  Nothing makes it worse or better.  Denies any trauma.  Denies any headache, chest pain, shortness of breath.  Finished a course of doxycycline.  The history is provided by the patient.       Home Medications Prior to Admission medications   Medication Sig Start Date End Date Taking? Authorizing Provider  clindamycin (CLEOCIN) 300 MG capsule Take 1 capsule (300 mg total) by mouth 3 (three) times daily for 10 days. 02/16/22 02/26/22 Yes Hedaya Latendresse, DO  amLODipine (NORVASC) 5 MG tablet Take 5 mg by mouth at bedtime.  04/13/15   [provider]  ANORO ELLIPTA 62.5-25 MCG/INH AEPB Inhale 1 puff into the lungs daily. 07/04/18   [provider]  apixaban (ELIQUIS) 2.5 MG TABS tablet TAKE 1 TABLET (2.5 MG TOTAL) BY MOUTH 2 (TWO) TIMES DAILY 07/29/21   Gatha Mayer, MD  Ascorbic Acid (VITAMIN C PO) Take 1 tablet by mouth daily.    [provider]  atorvastatin (LIPITOR) 20 MG tablet Take 20 mg by mouth at bedtime.     [provider]  Calcium Citrate-Vitamin D (CALCIUM + D PO) Take 1 tablet by mouth 2 (two) times daily.    [provider]  cetirizine (ZYRTEC) 10 MG tablet Take 10 mg by mouth daily as needed for allergies.    [provider]  famotidine (PEPCID) 20 MG tablet Take 20 mg by mouth daily.    [provider]  ferrous sulfate 325  (65 FE) MG tablet Take 325 mg by mouth daily.    [provider]  hydrALAZINE (APRESOLINE) 25 MG tablet Take 25 mg by mouth 2 (two) times daily.     [provider]  losartan (COZAAR) 50 MG tablet Take 50 mg by mouth daily. 08/21/20   [provider]  metoprolol succinate (TOPROL-XL) 25 MG 24 hr tablet Take 1 tablet (25 mg total) by mouth daily. 07/23/18   Fenton, Clint R, PA  montelukast (SINGULAIR) 10 MG tablet Take 10 mg by mouth daily. 08/02/19   [provider]  Multiple Vitamin (MULTIVITAMIN) tablet Take 1 tablet by mouth daily.    [provider]  ofloxacin (OCUFLOX) 0.3 % ophthalmic solution Place 1 drop into the right eye See admin instructions. Instill 1 drop into right eye 4 times daily for 1 week every month starting on the day of the monthly eye injection 05/28/21   [provider]  omeprazole (PRILOSEC) 40 MG capsule Take 40 mg by mouth daily.  07/22/18   [provider]  OXYGEN Inhale into the lungs. As needed    [provider]  pramipexole (MIRAPEX) 0.25 MG tablet Take 0.25 mg by mouth at bedtime. 02/04/21   [provider]  Spacer/Aero-Holding Chambers (AEROCHAMBER MV) inhaler Use as instructed with Albuterol [facility] 10/07/19  Parrett, Tammy S, NP  VENTOLIN HFA 108 (90 BASE) MCG/ACT inhaler Inhale 1-2 puffs into the lungs every 6 (six) hours as needed for wheezing.  03/03/15   [provider]  VITAMIN D PO Take 1 tablet by mouth daily.    [provider]      Allergies    Bacitracin, Neosporin [neomycin-bacitracin zn-polymyx], and Polysporin [bacitracin-polymyxin b]    Review of Systems   Review of Systems  Physical Exam Updated Vital Signs BP (!) 154/75 (BP Location: Left Arm)   Pulse 99   Temp (!) 97.5 F (36.4 C) (Oral)   Resp 20   SpO2 96%  Physical Exam Vitals and nursing note reviewed.  Constitutional:      General: She is not in acute distress.    Appearance: She is  well-developed.  HENT:     Head: Normocephalic and atraumatic.  Eyes:     Extraocular Movements: Extraocular movements intact.     Conjunctiva/sclera: Conjunctivae normal.     Pupils: Pupils are equal, round, and reactive to light.  Cardiovascular:     Rate and Rhythm: Normal rate and regular rhythm.     Pulses: Normal pulses.     Heart sounds: Normal heart sounds. No murmur heard. Pulmonary:     Effort: Pulmonary effort is normal. No respiratory distress.     Breath sounds: Normal breath sounds.  Abdominal:     Palpations: Abdomen is soft.     Tenderness: There is no abdominal tenderness.  Musculoskeletal:        General: Swelling present.     Cervical back: Normal range of motion and neck supple.     Comments: Mild area of redness and swelling to the right lateral foot/ankle, there is blistered region over the right lateral malleolus, normal range of motion of the ankle without much difficulty bilaterally or discomfort, no crepitus  Skin:    General: Skin is warm and dry.     Capillary Refill: Capillary refill takes less than 2 seconds.  Neurological:     General: No focal deficit present.     Mental Status: She is alert.     Sensory: No sensory deficit.     Motor: No weakness.  Psychiatric:        Mood and Affect: Mood normal.     ED Results / Procedures / Treatments   Labs (all labs ordered are listed, but only abnormal results are displayed) Labs Reviewed - No data to display  EKG None  Radiology DG Ankle Complete Right  Result Date: 02/16/2022 CLINICAL DATA:  RIGHT ankle pain and swelling with redness for 2 weeks. EXAM: RIGHT ANKLE - COMPLETE 3+ VIEW COMPARISON:  10/28/2016 radiographs FINDINGS: There is no evidence of acute fracture, subluxation or dislocation. No radiographic evidence of acute osteomyelitis noted. A plantar calcaneal spur is again identified. Soft tissue swelling is present. IMPRESSION: 1. Soft tissue swelling without acute bony abnormality. 2.  Calcaneal spur. Electronically Signed   By: Margarette Canada M.D.   On: 02/16/2022 10:17    Procedures Procedures    Medications Ordered in ED Medications - No data to display  ED Course/ Medical Decision Making/ A&P                           Medical Decision Making Amount and/or Complexity of Data Reviewed Radiology: ordered.  Risk Prescription drug management.   GUADALUPE KEREKES is here with swelling and redness to the  right lower leg.  She just finished a course of antibiotics for possible cellulitis.  History of venous congestion in her legs, history of COPD, CKD, atrial fibrillation on Eliquis.  Neurovascular neuromuscularly she is intact on exam.  Differential diagnosis is likely ongoing infectious process/cellulitis versus possibly inflammatory process versus venous congestion.  I have no concern for septic joint or DVT.  She is on blood thinners.  X-ray of the right ankle was performed that shows some diffuse swelling but there is no signs of osteomyelitis.  Overall we will place her in a cam walking boot.  We will start her on different antibiotic and have her follow-up with podiatry.  Suspect that this is more of a chronic process.  She is not septic.  She has normal vitals.  At this time I think we can hold off on any IV antibiotics.  We will have her follow-up with specialist.  They understand return precautions.  Discharged in good condition.  This chart was dictated using voice recognition software.  Despite best efforts to proofread,  errors can occur which can change the documentation meaning.         Final Clinical Impression(s) / ED Diagnoses Final diagnoses:  Cellulitis, unspecified cellulitis site  Venous stasis    Rx / DC Orders ED Discharge Orders          Ordered    clindamycin (CLEOCIN) 300 MG capsule  3 times daily        02/16/22 1016              Ardith Test, [place], DO 02/16/22 1026    Areon Cocuzza, DO 02/16/22 1026

## 2022-02-16 NOTE — ED Notes (Signed)
Right lower leg feet has some swelling . Skin red in color. Blister on rt side of feet

## 2022-02-16 NOTE — Discharge Instructions (Addendum)
Wear walking boot for comfort. Start new antibiotic.  Follow-up with Dr. Amalia Hailey of podiatry.  Please return if you develop fever or worsening symptoms.

## 2022-02-16 NOTE — ED Notes (Signed)
Applied non stick dressing to pt wound, covered with guaze wrap. Instructed pt to keep covered per Dr Ronnald Nian, follow up as directed on discharge order.

## 2022-02-17 ENCOUNTER — Ambulatory Visit (INDEPENDENT_AMBULATORY_CARE_PROVIDER_SITE_OTHER): Payer: Medicare Other | Admitting: Podiatry

## 2022-02-17 DIAGNOSIS — L03119 Cellulitis of unspecified part of limb: Secondary | ICD-10-CM | POA: Diagnosis not present

## 2022-02-17 DIAGNOSIS — L02415 Cutaneous abscess of right lower limb: Secondary | ICD-10-CM | POA: Diagnosis not present

## 2022-02-17 DIAGNOSIS — L02619 Cutaneous abscess of unspecified foot: Secondary | ICD-10-CM | POA: Diagnosis not present

## 2022-02-17 NOTE — Progress Notes (Signed)
Chief Complaint  Patient presents with   right ankle pain    Patient is here for right ankle pain for 1 week.patient has been on abx for 1 week.    HPI: 88 y.o. female PMHx CKD, A-fib on Eliquis, venous stasis presenting today as a new patient referral from the ED for evaluation of increased pain and tenderness to the right ankle.  Patient states that she does have a history of cellulitis to the right lower extremity.  She noticed a small blister with drainage over the past week.  She has noticed the discomfort with redness and swelling for about 2 weeks now.  She is currently on oral antibiotics that were prescribed at discharge from the ED.  Currently on clindamycin 300 mg 3 times daily.  Completed a course of doxycycline 100 mg twice daily.  She denies a history of injury.  Past Medical History:  Diagnosis Date   Arthritis    Basal cell carcinoma    on  her face   Blood transfusion without reported diagnosis 2022   Cataract    bilateral sx   Cellulitis of left leg 07/28/2014   hospitalized for 4 days   Chronic kidney disease    COPD (chronic obstructive pulmonary disease) (HCC)    Emphysema of lung (HCC)    Esophageal web    GERD (gastroesophageal reflux disease)    History of granulomatous disease    Hyperlipidemia    on meds   Hypertension    on meds   Iron deficiency anemia    Left leg swelling    Paroxysmal atrial fibrillation (HCC)    Right-sided low back pain with sciatica    Seasonal allergies     Past Surgical History:  Procedure Laterality Date   ABDOMINAL HYSTERECTOMY  2000   ANKLE SURGERY Left 1996   APPENDECTOMY  1978   BALLOON DILATION N/A 07/29/2021   Procedure: BALLOON DILATION;  Surgeon: Gatha Mayer, MD;  Location: [facility];  Service: Endoscopy;  Laterality: N/A;   BIOPSY  07/29/2021   Procedure: BIOPSY;  Surgeon: Gatha Mayer, MD;  Location: [facility];  Service: Endoscopy;;   CATARACT EXTRACTION Bilateral 2013   ESOPHAGOGASTRODUODENOSCOPY  (EGD) WITH PROPOFOL N/A 07/29/2021   Procedure: ESOPHAGOGASTRODUODENOSCOPY (EGD) WITH PROPOFOL;  Surgeon: Gatha Mayer, MD;  Location: [facility];  Service: Endoscopy;  Laterality: N/A;   TOTAL KNEE ARTHROPLASTY Right 2008   TOTAL KNEE ARTHROPLASTY Left 2012    Allergies  Allergen Reactions   Bacitracin     Makes wound worse    Neosporin [Neomycin-Bacitracin Zn-Polymyx]     Makes wound worse    Polysporin [Bacitracin-Polymyxin B]     Makes wound worse      Physical Exam: General: The patient is alert and oriented x3 in no acute distress.  Dermatology: Skin is warm, dry and supple bilateral lower extremities. Negative for open lesions or macerations.  Vascular: Palpable pedal pulses bilaterally. Capillary refill within normal limits.  Negative for any significant edema or erythema  Neurological: Light touch and protective threshold grossly intact  Musculoskeletal Exam: No pedal deformities noted  Radiographic Exam RT ankle yesterday, 02/16/2022 ED:  FINDINGS: There is no evidence of acute fracture, subluxation or dislocation. No radiographic evidence of acute osteomyelitis noted. A plantar calcaneal spur is again identified. Soft tissue swelling is present.   IMPRESSION: 1. Soft tissue swelling without acute bony abnormality. 2. Calcaneal spur.   Assessment: 1.  Ulcer/abscess lateral aspect of the right posterior  ankle   Plan of Care:  1. Patient evaluated.  2.  There is concern today with deeper underlying abscess to the posterior aspect of the ankle.  With pressure I was able to milk out a consider amount of purulent drainage coming from the small ulcer site 3.  Order placed for MRI RT ankle wo contrast 4.  Continue oral ABX as per ED, yesterday, 02/16/2022.  Clindamycin 300 mg 3 times daily x10 days 5.  Betadine ointment provided for the patient to apply daily with a light dressing 6.  Return to clinic in 2 weeks   Edrick Kins, DPM [facility]  Dr. Edrick Kins, DPM    [address], [place] [address]                Office [phone]  Fax [phone]

## 2022-02-17 NOTE — Progress Notes (Signed)
Dg  

## 2022-02-19 DIAGNOSIS — I872 Venous insufficiency (chronic) (peripheral): Secondary | ICD-10-CM | POA: Diagnosis not present

## 2022-02-19 DIAGNOSIS — I4891 Unspecified atrial fibrillation: Secondary | ICD-10-CM | POA: Diagnosis not present

## 2022-02-19 DIAGNOSIS — R110 Nausea: Secondary | ICD-10-CM | POA: Diagnosis not present

## 2022-02-19 DIAGNOSIS — L03115 Cellulitis of right lower limb: Secondary | ICD-10-CM | POA: Diagnosis not present

## 2022-02-19 DIAGNOSIS — I130 Hypertensive heart and chronic kidney disease with heart failure and stage 1 through stage 4 chronic kidney disease, or unspecified chronic kidney disease: Secondary | ICD-10-CM | POA: Diagnosis not present

## 2022-02-21 LAB — WOUND CULTURE
MICRO NUMBER:: 14087611
RESULT:: NO GROWTH
SPECIMEN QUALITY:: ADEQUATE

## 2022-03-04 DIAGNOSIS — F419 Anxiety disorder, unspecified: Secondary | ICD-10-CM | POA: Diagnosis not present

## 2022-03-04 DIAGNOSIS — M810 Age-related osteoporosis without current pathological fracture: Secondary | ICD-10-CM | POA: Diagnosis not present

## 2022-03-04 DIAGNOSIS — R7989 Other specified abnormal findings of blood chemistry: Secondary | ICD-10-CM | POA: Diagnosis not present

## 2022-03-04 DIAGNOSIS — E785 Hyperlipidemia, unspecified: Secondary | ICD-10-CM | POA: Diagnosis not present

## 2022-03-04 DIAGNOSIS — N1832 Chronic kidney disease, stage 3b: Secondary | ICD-10-CM | POA: Diagnosis not present

## 2022-03-04 DIAGNOSIS — I10 Essential (primary) hypertension: Secondary | ICD-10-CM | POA: Diagnosis not present

## 2022-03-08 ENCOUNTER — Ambulatory Visit
Admission: RE | Admit: 2022-03-08 | Discharge: 2022-03-08 | Disposition: A | Discharge status: Home / Self Care | Payer: Medicare Other | Source: Ambulatory Visit | Attending: Podiatry | Admitting: Podiatry

## 2022-03-08 DIAGNOSIS — L02415 Cutaneous abscess of right lower limb: Secondary | ICD-10-CM

## 2022-03-08 DIAGNOSIS — M25571 Pain in right ankle and joints of right foot: Secondary | ICD-10-CM | POA: Diagnosis not present

## 2022-03-08 DIAGNOSIS — M609 Myositis, unspecified: Secondary | ICD-10-CM | POA: Diagnosis not present

## 2022-03-08 DIAGNOSIS — M7989 Other specified soft tissue disorders: Secondary | ICD-10-CM | POA: Diagnosis not present

## 2022-03-10 ENCOUNTER — Ambulatory Visit (INDEPENDENT_AMBULATORY_CARE_PROVIDER_SITE_OTHER): Payer: Medicare Other | Admitting: Podiatry

## 2022-03-10 DIAGNOSIS — L02415 Cutaneous abscess of right lower limb: Secondary | ICD-10-CM

## 2022-03-10 NOTE — Progress Notes (Signed)
Chief Complaint  Patient presents with   Follow-up    Patient is here for right foot ankle pain.patient states that the ankle still hurts at times.    HPI: 88 y.o. female presenting today for follow-up evaluation of a cutaneous abscess to the posterior aspect of the right ankle.  Last visit on 02/17/2022 there was some purulent drainage that came from the area and there was concern for deeper underlying infection.  At that time an MRI was ordered and she was placed on oral antibiotics.  She has completed the antibiotics and there has been some improvement but she continues to have pain to the area.  She had the MRI completed on 03/08/2022.  Past Medical History:  Diagnosis Date   Arthritis    Basal cell carcinoma    on  her face   Blood transfusion without reported diagnosis 2022   Cataract    bilateral sx   Cellulitis of left leg 07/28/2014   hospitalized for 4 days   Chronic kidney disease    COPD (chronic obstructive pulmonary disease) (HCC)    Emphysema of lung (HCC)    Esophageal web    GERD (gastroesophageal reflux disease)    History of granulomatous disease    Hyperlipidemia    on meds   Hypertension    on meds   Iron deficiency anemia    Left leg swelling    Paroxysmal atrial fibrillation (HCC)    Right-sided low back pain with sciatica    Seasonal allergies     Past Surgical History:  Procedure Laterality Date   ABDOMINAL HYSTERECTOMY  2000   ANKLE SURGERY Left 1996   APPENDECTOMY  1978   BALLOON DILATION N/A 07/29/2021   Procedure: BALLOON DILATION;  Surgeon: Gatha Mayer, MD;  Location: [facility];  Service: Endoscopy;  Laterality: N/A;   BIOPSY  07/29/2021   Procedure: BIOPSY;  Surgeon: Gatha Mayer, MD;  Location: [facility];  Service: Endoscopy;;   CATARACT EXTRACTION Bilateral 2013   ESOPHAGOGASTRODUODENOSCOPY (EGD) WITH PROPOFOL N/A 07/29/2021   Procedure: ESOPHAGOGASTRODUODENOSCOPY (EGD) WITH PROPOFOL;  Surgeon: Gatha Mayer, MD;  Location:  [facility];  Service: Endoscopy;  Laterality: N/A;   TOTAL KNEE ARTHROPLASTY Right 2008   TOTAL KNEE ARTHROPLASTY Left 2012    Allergies  Allergen Reactions   Bacitracin     Makes wound worse    Neosporin [Neomycin-Bacitracin Zn-Polymyx]     Makes wound worse    Polysporin [Bacitracin-Polymyxin B]     Makes wound worse      Physical Exam: General: The patient is alert and oriented x3 in no acute distress.   Dermatology: Skin is warm, dry and supple bilateral lower extremities. Negative for open lesions or macerations.   Vascular: Palpable pedal pulses bilaterally.  Chronic edema right lower extremity noted.  Today there is no appreciable erythema to the area.  Neurological: Light touch and protective threshold grossly intact   Musculoskeletal Exam: No pedal deformities noted   Radiographic Exam RT ankle, 02/16/2022 ED:  FINDINGS: There is no evidence of acute fracture, subluxation or dislocation. No radiographic evidence of acute osteomyelitis noted. A plantar calcaneal spur is again identified. Soft tissue swelling is present.   IMPRESSION: 1. Soft tissue swelling without acute bony abnormality. 2. Calcaneal spur.    Assessment: 1.  Ulcer/abscess lateral aspect of the right posterior ankle   Plan of Care:  1. Patient evaluated.  2.  Patient had MRI of the right ankle completed 03/08/2022.  Contacted  imaging for final report of the MRI reading.  MRI was reviewed by myself but official read from radiologist will not be available for the next 24-48 hours.  I personally was unable to identify any abscess within the area 3.  MRI report pending.  Will contact patient for finalized MRI results 4.  Overall however there is some improvement.  Return to clinic 4 weeks or sooner if we need to discuss surgery which would include incision and drainage of any identifiable abscess to the area     Edrick Kins, DPM [facility]  Dr. Edrick Kins, DPM     [address], [place] [address]                Office [phone]  Fax [phone]

## 2022-03-11 DIAGNOSIS — I4891 Unspecified atrial fibrillation: Secondary | ICD-10-CM | POA: Diagnosis not present

## 2022-03-11 DIAGNOSIS — D692 Other nonthrombocytopenic purpura: Secondary | ICD-10-CM | POA: Diagnosis not present

## 2022-03-11 DIAGNOSIS — D6869 Other thrombophilia: Secondary | ICD-10-CM | POA: Diagnosis not present

## 2022-03-11 DIAGNOSIS — Z1339 Encounter for screening examination for other mental health and behavioral disorders: Secondary | ICD-10-CM | POA: Diagnosis not present

## 2022-03-11 DIAGNOSIS — K2950 Unspecified chronic gastritis without bleeding: Secondary | ICD-10-CM | POA: Diagnosis not present

## 2022-03-11 DIAGNOSIS — N184 Chronic kidney disease, stage 4 (severe): Secondary | ICD-10-CM | POA: Diagnosis not present

## 2022-03-11 DIAGNOSIS — J449 Chronic obstructive pulmonary disease, unspecified: Secondary | ICD-10-CM | POA: Diagnosis not present

## 2022-03-11 DIAGNOSIS — M6780 Other specified disorders of synovium and tendon, unspecified site: Secondary | ICD-10-CM | POA: Diagnosis not present

## 2022-03-11 DIAGNOSIS — Z0000 Encounter for general adult medical examination without abnormal findings: Secondary | ICD-10-CM | POA: Diagnosis not present

## 2022-03-11 DIAGNOSIS — Z1331 Encounter for screening for depression: Secondary | ICD-10-CM | POA: Diagnosis not present

## 2022-03-11 DIAGNOSIS — I130 Hypertensive heart and chronic kidney disease with heart failure and stage 1 through stage 4 chronic kidney disease, or unspecified chronic kidney disease: Secondary | ICD-10-CM | POA: Diagnosis not present

## 2022-03-11 DIAGNOSIS — R1310 Dysphagia, unspecified: Secondary | ICD-10-CM | POA: Diagnosis not present

## 2022-03-11 DIAGNOSIS — D649 Anemia, unspecified: Secondary | ICD-10-CM | POA: Diagnosis not present

## 2022-03-11 DIAGNOSIS — E611 Iron deficiency: Secondary | ICD-10-CM | POA: Diagnosis not present

## 2022-03-11 DIAGNOSIS — I872 Venous insufficiency (chronic) (peripheral): Secondary | ICD-10-CM | POA: Diagnosis not present

## 2022-03-11 DIAGNOSIS — R82998 Other abnormal findings in urine: Secondary | ICD-10-CM | POA: Diagnosis not present

## 2022-03-11 DIAGNOSIS — S86111A Strain of other muscle(s) and tendon(s) of posterior muscle group at lower leg level, right leg, initial encounter: Secondary | ICD-10-CM | POA: Diagnosis not present

## 2022-03-11 NOTE — Progress Notes (Signed)
Called patient.  No answer.  Left voicemail regarding MRI results. - Dr. Amalia Hailey

## 2022-03-12 DIAGNOSIS — H353211 Exudative age-related macular degeneration, right eye, with active choroidal neovascularization: Secondary | ICD-10-CM | POA: Diagnosis not present

## 2022-03-14 DIAGNOSIS — M79661 Pain in right lower leg: Secondary | ICD-10-CM | POA: Diagnosis not present

## 2022-03-18 DIAGNOSIS — I4891 Unspecified atrial fibrillation: Secondary | ICD-10-CM | POA: Diagnosis not present

## 2022-03-18 DIAGNOSIS — D649 Anemia, unspecified: Secondary | ICD-10-CM | POA: Diagnosis not present

## 2022-03-18 DIAGNOSIS — L03115 Cellulitis of right lower limb: Secondary | ICD-10-CM | POA: Diagnosis not present

## 2022-03-18 DIAGNOSIS — R197 Diarrhea, unspecified: Secondary | ICD-10-CM | POA: Diagnosis not present

## 2022-03-18 DIAGNOSIS — I10 Essential (primary) hypertension: Secondary | ICD-10-CM | POA: Diagnosis not present

## 2022-03-18 DIAGNOSIS — M7989 Other specified soft tissue disorders: Secondary | ICD-10-CM | POA: Diagnosis not present

## 2022-03-18 DIAGNOSIS — R339 Retention of urine, unspecified: Secondary | ICD-10-CM | POA: Diagnosis not present

## 2022-03-18 DIAGNOSIS — N179 Acute kidney failure, unspecified: Secondary | ICD-10-CM | POA: Diagnosis not present

## 2022-03-19 ENCOUNTER — Other Ambulatory Visit (HOSPITAL_COMMUNITY): Payer: Self-pay | Admitting: Registered Nurse

## 2022-03-19 ENCOUNTER — Ambulatory Visit (HOSPITAL_COMMUNITY)
Admission: RE | Admit: 2022-03-19 | Discharge: 2022-03-19 | Disposition: A | Discharge status: Home / Self Care | Payer: Medicare Other | Source: Ambulatory Visit | Attending: Registered Nurse | Admitting: Registered Nurse

## 2022-03-19 ENCOUNTER — Encounter (HOSPITAL_COMMUNITY): Payer: Self-pay

## 2022-03-19 DIAGNOSIS — R339 Retention of urine, unspecified: Secondary | ICD-10-CM | POA: Diagnosis not present

## 2022-03-19 DIAGNOSIS — N289 Disorder of kidney and ureter, unspecified: Secondary | ICD-10-CM | POA: Insufficient documentation

## 2022-03-19 DIAGNOSIS — N3289 Other specified disorders of bladder: Secondary | ICD-10-CM | POA: Diagnosis not present

## 2022-03-19 DIAGNOSIS — K5730 Diverticulosis of large intestine without perforation or abscess without bleeding: Secondary | ICD-10-CM | POA: Diagnosis not present

## 2022-03-19 LAB — POCT I-STAT CREATININE: Creatinine, Ser: 1.6 mg/dL — ABNORMAL HIGH (ref 0.44–1.00)

## 2022-03-19 MED ORDER — SODIUM CHLORIDE (PF) 0.9 % IJ SOLN
INTRAMUSCULAR | Status: AC
  Filled 2022-03-19: qty 50

## 2022-03-19 MED ORDER — IOHEXOL 300 MG/ML  SOLN
100.0000 mL | Freq: Once | INTRAMUSCULAR | Status: AC | PRN
  Administered 2022-03-19: 100 mL via INTRAVENOUS

## 2022-03-26 ENCOUNTER — Ambulatory Visit (INDEPENDENT_AMBULATORY_CARE_PROVIDER_SITE_OTHER): Payer: Medicare Other | Admitting: Podiatry

## 2022-03-26 DIAGNOSIS — L02415 Cutaneous abscess of right lower limb: Secondary | ICD-10-CM

## 2022-03-26 MED ORDER — MUPIROCIN 2 % EX OINT: 1.0000 | TOPICAL_OINTMENT | Freq: Two times a day (BID) | CUTANEOUS | 1 refills | Status: DC

## 2022-03-26 NOTE — Progress Notes (Signed)
Chief Complaint  Patient presents with   Cutaneous abscess of right ankle    HPI: 88 y.o. female presenting today for follow-up evaluation of a cutaneous abscess to the posterior aspect of the right ankle.  Overall the patient has noticed significant improvement.  She continues to have some pain and tenderness to the ankle however.  Presenting for further treatment and evaluation  Past Medical History:  Diagnosis Date   Arthritis    Basal cell carcinoma    on  her face   Blood transfusion without reported diagnosis 2022   Cataract    bilateral sx   Cellulitis of left leg 07/28/2014   hospitalized for 4 days   Chronic kidney disease    COPD (chronic obstructive pulmonary disease) (HCC)    Emphysema of lung (HCC)    Esophageal web    GERD (gastroesophageal reflux disease)    History of granulomatous disease    Hyperlipidemia    on meds   Hypertension    on meds   Iron deficiency anemia    Left leg swelling    Paroxysmal atrial fibrillation (HCC)    Right-sided low back pain with sciatica    Seasonal allergies     Past Surgical History:  Procedure Laterality Date   ABDOMINAL HYSTERECTOMY  2000   ANKLE SURGERY Left 1996   APPENDECTOMY  1978   BALLOON DILATION N/A 07/29/2021   Procedure: BALLOON DILATION;  Surgeon: Gatha Mayer, MD;  Location: [facility];  Service: Endoscopy;  Laterality: N/A;   BIOPSY  07/29/2021   Procedure: BIOPSY;  Surgeon: Gatha Mayer, MD;  Location: [facility];  Service: Endoscopy;;   CATARACT EXTRACTION Bilateral 2013   ESOPHAGOGASTRODUODENOSCOPY (EGD) WITH PROPOFOL N/A 07/29/2021   Procedure: ESOPHAGOGASTRODUODENOSCOPY (EGD) WITH PROPOFOL;  Surgeon: Gatha Mayer, MD;  Location: [facility];  Service: Endoscopy;  Laterality: N/A;   TOTAL KNEE ARTHROPLASTY Right 2008   TOTAL KNEE ARTHROPLASTY Left 2012    Allergies  Allergen Reactions   Bacitracin     Makes wound worse    Neosporin [Neomycin-Bacitracin Zn-Polymyx]     Makes wound  worse    Polysporin [Bacitracin-Polymyxin B]     Makes wound worse      Physical Exam: General: The patient is alert and oriented x3 in no acute distress.   Dermatology: Skin is warm, dry and supple bilateral lower extremities.  Superficial crust noted to the lateral aspect of the right foot   Vascular: Palpable pedal pulses bilaterally.  Chronic edema right lower extremity noted.  Today there is no appreciable erythema to the area.  Neurological: Light touch and protective threshold grossly intact   Musculoskeletal Exam: No pedal deformities noted   Radiographic Exam RT ankle, 02/16/2022 ED:  FINDINGS: There is no evidence of acute fracture, subluxation or dislocation. No radiographic evidence of acute osteomyelitis noted. A plantar calcaneal spur is again identified. Soft tissue swelling is present.   IMPRESSION: 1. Soft tissue swelling without acute bony abnormality. 2. Calcaneal spur.    Assessment: 1.  Ulcer/abscess lateral aspect of the right posterior ankle; improved   Plan of Care:  1. Patient evaluated.  2.  Overall there is some improvement to the right ankle 3.  Prescription for mupirocin ointment to apply to the area 2 times daily 4.  Return to clinic 2 weeks     Edrick Kins, DPM [facility]  Dr. Edrick Kins, DPM    [address]  [place], [place] [address]                Office [phone]  Fax [phone]

## 2022-04-07 ENCOUNTER — Encounter: Payer: Self-pay | Admitting: Podiatry

## 2022-04-07 ENCOUNTER — Telehealth: Payer: Self-pay | Admitting: Podiatry

## 2022-04-07 NOTE — Telephone Encounter (Signed)
Pt called back returning a call because we cxled her appt for next week. She did not need to r/s as she states the area seems to have cleared up and she will call if needed.

## 2022-04-14 ENCOUNTER — Ambulatory Visit: Payer: Medicare Other | Admitting: Podiatry

## 2022-04-15 ENCOUNTER — Encounter (HOSPITAL_COMMUNITY): Payer: Self-pay | Admitting: Physician Assistant

## 2022-04-15 ENCOUNTER — Ambulatory Visit (HOSPITAL_COMMUNITY)
Admission: RE | Admit: 2022-04-15 | Discharge: 2022-04-15 | Disposition: A | Discharge status: Home / Self Care | Payer: Medicare Other | Source: Ambulatory Visit | Attending: Physician Assistant | Admitting: Physician Assistant

## 2022-04-15 VITALS — BP 146/70 | HR 78 | Ht 60.0 in | Wt 129.2 lb

## 2022-04-15 DIAGNOSIS — J449 Chronic obstructive pulmonary disease, unspecified: Secondary | ICD-10-CM | POA: Insufficient documentation

## 2022-04-15 DIAGNOSIS — Z7901 Long term (current) use of anticoagulants: Secondary | ICD-10-CM | POA: Diagnosis not present

## 2022-04-15 DIAGNOSIS — Z8249 Family history of ischemic heart disease and other diseases of the circulatory system: Secondary | ICD-10-CM | POA: Diagnosis not present

## 2022-04-15 DIAGNOSIS — I10 Essential (primary) hypertension: Secondary | ICD-10-CM | POA: Insufficient documentation

## 2022-04-15 DIAGNOSIS — Z79899 Other long term (current) drug therapy: Secondary | ICD-10-CM | POA: Insufficient documentation

## 2022-04-15 DIAGNOSIS — Z87891 Personal history of nicotine dependence: Secondary | ICD-10-CM | POA: Diagnosis not present

## 2022-04-15 DIAGNOSIS — I4719 Other supraventricular tachycardia: Secondary | ICD-10-CM | POA: Diagnosis not present

## 2022-04-15 DIAGNOSIS — D6869 Other thrombophilia: Secondary | ICD-10-CM | POA: Diagnosis not present

## 2022-04-15 DIAGNOSIS — I739 Peripheral vascular disease, unspecified: Secondary | ICD-10-CM | POA: Insufficient documentation

## 2022-04-15 DIAGNOSIS — H353211 Exudative age-related macular degeneration, right eye, with active choroidal neovascularization: Secondary | ICD-10-CM | POA: Diagnosis not present

## 2022-04-15 DIAGNOSIS — I480 Paroxysmal atrial fibrillation: Secondary | ICD-10-CM | POA: Insufficient documentation

## 2022-04-15 NOTE — Progress Notes (Signed)
Primary Care Physician: Velna Hatchet, MD Primary Cardiologist: none Primary Electrophysiologist: none Referring Physician: [facility]   Judith Boone is a 88 y.o. female with a history of COPD, HTN, HLD, PVD and paroxysmal atrial fibrillation who presents for follow up in the [facility]. The patient was initially diagnosed with atrial fibrillation 07/17/18 after presenting to the ER with elevated heart rates on her Apple Watch. She was asymptomatic during the event. She was started on Eliquis for a CHADS2VASC score 5 and metoprolol.  Zio patch 03/2019 showed no afib with brief epidodes of atrial tach and occasional PACs and PVCs. She was seen by her PCP 01/2021 for her yearly physical and was found to be profoundly anemic. She was told to go to the ED for evaluation and was given PRBC x 2. She had upper EGD 07/29/21 which showed gastritis.   On follow up today, patient reports that she has done well from an afib standpoint. She did have one episode of tachypalpitations which resolved within several minutes. She has been dealing with an abscess on her right ankle and has been on antibiotics for several weeks. It is much improved but her right leg is still a little swollen. No bleeding issues on anticoagulation. She is on iron supplementation.   Today, she denies symptoms of palpitations, chest pain, shortness of breath, orthopnea, PND, dizziness, presyncope, syncope, snoring, daytime somnolence, bleeding, or neurologic sequela. The patient is tolerating medications without difficulties and is otherwise without complaint today.    Atrial Fibrillation Risk Factors:  she does not have symptoms or diagnosis of sleep apnea. she does not have a history of rheumatic fever. she does not have a history of alcohol use. The patient does not have a history of early familial atrial fibrillation or other arrhythmias.  she has a BMI of Body mass index is 25.23 kg/m.Marland Kitchen Filed  Weights   04/15/22 1354  Weight: 58.6 kg    Family History  Problem Relation Age of Onset   Alcohol abuse Father    Heart disease Father    Hypertension Father    Hypertension Daughter    Hypertension Son    Asthma Son    Colon polyps Neg Hx    Colon cancer Neg Hx    Esophageal cancer Neg Hx    Rectal cancer Neg Hx    Stomach cancer Neg Hx      Atrial Fibrillation Management history:  Previous antiarrhythmic drugs: none Previous cardioversions: none Previous ablations: none CHADS2VASC score: 5 Anticoagulation history: Eliquis   Past Medical History:  Diagnosis Date   Arthritis    Basal cell carcinoma    on  her face   Blood transfusion without reported diagnosis 2022   Cataract    bilateral sx   Cellulitis of left leg 07/28/2014   hospitalized for 4 days   Chronic kidney disease    COPD (chronic obstructive pulmonary disease) (HCC)    Emphysema of lung (HCC)    Esophageal web    GERD (gastroesophageal reflux disease)    History of granulomatous disease    Hyperlipidemia    on meds   Hypertension    on meds   Iron deficiency anemia    Left leg swelling    Paroxysmal atrial fibrillation (HCC)    Right-sided low back pain with sciatica    Seasonal allergies    Past Surgical History:  Procedure Laterality Date   ABDOMINAL HYSTERECTOMY  2000   ANKLE  SURGERY Left 1996   APPENDECTOMY  1978   BALLOON DILATION N/A 07/29/2021   Procedure: BALLOON DILATION;  Surgeon: Gatha Mayer, MD;  Location: [facility];  Service: Endoscopy;  Laterality: N/A;   BIOPSY  07/29/2021   Procedure: BIOPSY;  Surgeon: Gatha Mayer, MD;  Location: [facility];  Service: Endoscopy;;   CATARACT EXTRACTION Bilateral 2013   ESOPHAGOGASTRODUODENOSCOPY (EGD) WITH PROPOFOL N/A 07/29/2021   Procedure: ESOPHAGOGASTRODUODENOSCOPY (EGD) WITH PROPOFOL;  Surgeon: Gatha Mayer, MD;  Location: [facility];  Service: Endoscopy;  Laterality: N/A;   TOTAL KNEE ARTHROPLASTY Right 2008   TOTAL  KNEE ARTHROPLASTY Left 2012    Current Outpatient Medications  Medication Sig Dispense Refill   amLODipine (NORVASC) 5 MG tablet Take 5 mg by mouth at bedtime.   5   ANORO ELLIPTA 62.5-25 MCG/INH AEPB Inhale 1 puff into the lungs daily.     apixaban (ELIQUIS) 2.5 MG TABS tablet TAKE 1 TABLET (2.5 MG TOTAL) BY MOUTH 2 (TWO) TIMES DAILY 180 tablet 2   Ascorbic Acid (VITAMIN C PO) Take 1 tablet by mouth daily.     atorvastatin (LIPITOR) 20 MG tablet Take 20 mg by mouth at bedtime.      Calcium Citrate-Vitamin D (CALCIUM + D PO) Take 1 tablet by mouth 2 (two) times daily.     cetirizine (ZYRTEC) 10 MG tablet Take 10 mg by mouth daily as needed for allergies.     famotidine (PEPCID) 20 MG tablet Take 20 mg by mouth daily.     ferrous sulfate 325 (65 FE) MG tablet Take 325 mg by mouth daily.     hydrALAZINE (APRESOLINE) 25 MG tablet Take 25 mg by mouth 2 (two) times daily.      losartan (COZAAR) 50 MG tablet Take 50 mg by mouth daily.     metoprolol succinate (TOPROL-XL) 25 MG 24 hr tablet Take 1 tablet (25 mg total) by mouth daily. 90 tablet 2   montelukast (SINGULAIR) 10 MG tablet Take 10 mg by mouth daily.     Multiple Vitamin (MULTIVITAMIN) tablet Take 1 tablet by mouth daily.     mupirocin ointment (BACTROBAN) 2 % Apply 1 Application topically 2 (two) times daily. 22 g 1   omeprazole (PRILOSEC) 40 MG capsule Take 40 mg by mouth daily.      OXYGEN Inhale into the lungs. As needed     pramipexole (MIRAPEX) 0.25 MG tablet Take 0.25 mg by mouth at bedtime.     Spacer/Aero-Holding Chambers (AEROCHAMBER MV) inhaler Use as instructed with Albuterol HFA 1 each 0   VENTOLIN HFA 108 (90 BASE) MCG/ACT inhaler Inhale 1-2 puffs into the lungs every 6 (six) hours as needed for wheezing.   3   VITAMIN D PO Take 1 tablet by mouth daily.     No current facility-administered medications for this encounter.    Allergies  Allergen Reactions   Bacitracin     Makes wound worse    Neosporin  [Neomycin-Bacitracin Zn-Polymyx]     Makes wound worse    Polysporin [Bacitracin-Polymyxin B]     Makes wound worse     Social History   Socioeconomic History   Marital status: Widowed    Spouse name: Not on file   Number of children: Not on file   Years of education: Not on file   Highest education level: Not on file  Occupational History   Not on file  Tobacco Use   Smoking status: Former    Packs/day:  2.00    Years: 30.00    Total pack years: 60.00    Types: Cigarettes    Quit date: 04/29/1979    Years since quitting: 42.9   Smokeless tobacco: Never   Tobacco comments:    Former smoker 04/23/2021  Vaping Use   Vaping Use: Never used  Substance and Sexual Activity   Alcohol use: Yes    Alcohol/week: 2.0 - 3.0 standard drinks of alcohol    Types: 2 - 3 Glasses of wine per week    Comment: 1 glass of wine 2-3 times a week 10/22/21   Drug use: No   Sexual activity: Not on file  Other Topics Concern   Not on file  Social History Narrative   Patient is widowed.   She has 1 son and 1 daughter   1-2 alcoholic beverages a week 1 caffeinated beverage daily she is a former smoker not now      Social Determinants of Radio broadcast assistant Strain: Not on file  Food Insecurity: Not on file  Transportation Needs: Not on file  Physical Activity: Not on file  Stress: Not on file  Social Connections: Not on file  Intimate Partner Violence: Not on file     ROS- All systems are reviewed and negative except as per the HPI above.  Physical Exam: Vitals:   04/15/22 1354  BP: (!) 146/70  Pulse: 78  Weight: 58.6 kg  Height: 5' (1.524 m)    GEN- The patient is a well appearing elderly female, alert and oriented x 3 today.   HEENT-head normocephalic, atraumatic, sclera clear, conjunctiva pink, hearing intact, trachea midline. Lungs- Clear to ausculation bilaterally, normal work of breathing Heart- Regular rate and rhythm, no murmurs, rubs or gallops  GI- soft, NT,  ND, + BS Extremities- no clubbing, cyanosis, trace edema R>L MS- no significant deformity or atrophy Skin- no rash or lesion Psych- euthymic mood, full affect Neuro- strength and sensation are intact   Wt Readings from Last 3 Encounters:  04/15/22 58.6 kg  10/22/21 59 kg  07/29/21 58.1 kg    EKG today demonstrates  SR, 1st degree AV block Vent. rate 78 BPM PR interval 256 ms QRS duration 68 ms QT/QTcB 376/428 ms  Echo 10/12/18 demonstrated   1. The left ventricle has normal systolic function with an ejection fraction of 60-65%. The cavity size was normal. Left ventricular diastolic Doppler parameters are consistent with impaired relaxation.  2. The mitral valve is grossly normal.  3. The tricuspid valve is grossly normal.  4. The aortic valve was not well visualized. Aortic valve regurgitation is trivial by color flow Doppler. No stenosis of the aortic valve.  5. Normal LV function; mild diastolic dysfunction; trace AI; mild TR; mildly elevated pulmonary pressure.  Epic records are reviewed at length today  CHA2DS2-VASc Score = 5  The patient's score is based upon: CHF History: 0 HTN History: 1 Diabetes History: 0 Stroke History: 0 Vascular Disease History: 1 Age Score: 2 Gender Score: 1       ASSESSMENT AND PLAN: 1. Paroxysmal Atrial Fibrillation/atrial tachycardia The patient's CHA2DS2-VASc score is 5, indicating a 7.2% annual risk of stroke.   Patient appears to be maintaining SR. Continue Eliquis 2.5 mg BID (age, Cr)  Continue metoprolol 25 mg daily Apple Watch for home monitoring.   2. Secondary Hypercoagulable State (ICD10:  D68.69) The patient is at significant risk for stroke/thromboembolism based upon her CHA2DS2-VASc Score of 5.  Continue Apixaban (Eliquis).  3. HTN Stable, no changes today.   Follow up in the AF clinic as needed. Will refer to establish care with a primary cardiologist.    Adline Peals PA-C [facility] [address] [place], [place] [address] [phone] 04/15/2022 2:17 PM

## 2022-04-30 DIAGNOSIS — C44319 Basal cell carcinoma of skin of other parts of face: Secondary | ICD-10-CM | POA: Diagnosis not present

## 2022-04-30 DIAGNOSIS — D1801 Hemangioma of skin and subcutaneous tissue: Secondary | ICD-10-CM | POA: Diagnosis not present

## 2022-04-30 DIAGNOSIS — L570 Actinic keratosis: Secondary | ICD-10-CM | POA: Diagnosis not present

## 2022-04-30 DIAGNOSIS — L821 Other seborrheic keratosis: Secondary | ICD-10-CM | POA: Diagnosis not present

## 2022-04-30 DIAGNOSIS — Z85828 Personal history of other malignant neoplasm of skin: Secondary | ICD-10-CM | POA: Diagnosis not present

## 2022-04-30 DIAGNOSIS — L812 Freckles: Secondary | ICD-10-CM | POA: Diagnosis not present

## 2022-04-30 DIAGNOSIS — D485 Neoplasm of uncertain behavior of skin: Secondary | ICD-10-CM | POA: Diagnosis not present

## 2022-04-30 DIAGNOSIS — L720 Epidermal cyst: Secondary | ICD-10-CM | POA: Diagnosis not present

## 2022-05-12 DIAGNOSIS — Z9981 Dependence on supplemental oxygen: Secondary | ICD-10-CM | POA: Diagnosis not present

## 2022-05-12 DIAGNOSIS — I129 Hypertensive chronic kidney disease with stage 1 through stage 4 chronic kidney disease, or unspecified chronic kidney disease: Secondary | ICD-10-CM | POA: Diagnosis not present

## 2022-05-12 DIAGNOSIS — N184 Chronic kidney disease, stage 4 (severe): Secondary | ICD-10-CM | POA: Diagnosis not present

## 2022-05-12 DIAGNOSIS — J9611 Chronic respiratory failure with hypoxia: Secondary | ICD-10-CM | POA: Diagnosis not present

## 2022-05-12 DIAGNOSIS — D692 Other nonthrombocytopenic purpura: Secondary | ICD-10-CM | POA: Diagnosis not present

## 2022-05-12 DIAGNOSIS — R1310 Dysphagia, unspecified: Secondary | ICD-10-CM | POA: Diagnosis not present

## 2022-05-12 DIAGNOSIS — E611 Iron deficiency: Secondary | ICD-10-CM | POA: Diagnosis not present

## 2022-05-12 DIAGNOSIS — I4891 Unspecified atrial fibrillation: Secondary | ICD-10-CM | POA: Diagnosis not present

## 2022-05-12 DIAGNOSIS — S86111A Strain of other muscle(s) and tendon(s) of posterior muscle group at lower leg level, right leg, initial encounter: Secondary | ICD-10-CM | POA: Diagnosis not present

## 2022-05-12 DIAGNOSIS — K2950 Unspecified chronic gastritis without bleeding: Secondary | ICD-10-CM | POA: Diagnosis not present

## 2022-05-12 DIAGNOSIS — I700 Atherosclerosis of aorta: Secondary | ICD-10-CM | POA: Diagnosis not present

## 2022-05-12 DIAGNOSIS — M6780 Other specified disorders of synovium and tendon, unspecified site: Secondary | ICD-10-CM | POA: Diagnosis not present

## 2022-05-16 ENCOUNTER — Ambulatory Visit: Payer: Medicare Other | Admitting: Cardiology

## 2022-05-20 DIAGNOSIS — H353211 Exudative age-related macular degeneration, right eye, with active choroidal neovascularization: Secondary | ICD-10-CM | POA: Diagnosis not present

## 2022-05-30 ENCOUNTER — Encounter: Payer: Self-pay | Admitting: Cardiology

## 2022-05-30 ENCOUNTER — Ambulatory Visit: Payer: Medicare Other | Attending: Cardiology | Admitting: Cardiology

## 2022-05-30 VITALS — BP 140/68 | HR 77 | Ht 60.0 in | Wt 128.2 lb

## 2022-05-30 DIAGNOSIS — D500 Iron deficiency anemia secondary to blood loss (chronic): Secondary | ICD-10-CM

## 2022-05-30 DIAGNOSIS — I480 Paroxysmal atrial fibrillation: Secondary | ICD-10-CM | POA: Diagnosis not present

## 2022-05-30 NOTE — Patient Instructions (Signed)
Medication Instructions:  Please discontinue your Eliquis. Continue all other medications as listed.  *If you need a refill on your cardiac medications before your next appointment, please call your pharmacy*  Follow-Up: At [facility], you and your health needs are our priority.  As part of our continuing mission to provide you with exceptional heart care, we have created designated Provider Care Teams.  These Care Teams include your primary Cardiologist (physician) and Advanced Practice Providers (APPs -  Physician Assistants and Nurse Practitioners) who all work together to provide you with the care you need, when you need it.  We recommend signing up for the patient portal called "MyChart".  Sign up information is provided on this After Visit Summary.  MyChart is used to connect with patients for Virtual Visits (Telemedicine).  Patients are able to view lab/test results, encounter notes, upcoming appointments, etc.  Non-urgent messages can be sent to your provider as well.   To learn more about what you can do with MyChart, go to [email or web address].    Your next appointment:   6 month(s)  Provider:   Nicholes Rough, PA-C, Ambrose Pancoast, NP, Ermalinda Barrios, PA-C, Christen Bame, NP, or Richardson Dopp, PA-C     Then, Dr Candee Furbish will plan to see you again in 1 year(s).

## 2022-05-30 NOTE — Progress Notes (Signed)
Cardiology Office Note:    Date:  05/30/2022   ID:  Judith Boone, DOB 12/30/1931, MRN [id number]  PCP:  Judith Hatchet, MD   [facility] Cardiologist:  Judith Furbish, MD     Referring MD: Judith Barre, PA    History of Present Illness:    Judith Boone is a 89 y.o. female with COPD hypertension hyperlipidemia PVD and paroxysmal atrial fibrillation here for the establishment of care at the request of atrial fibrillation clinic, [place], [place].  She was diagnosed with atrial fibrillation on 07/17/2018 after presenting to the emergency room with elevated heart rate secondary to her Apple Watch diagnosis.  She was asymptomatic.  She was started on Eliquis for CHA2DS2-VASc score of 5 as well as metoprolol for rate control.  A Zio patch in December 2020 showed no atrial fibrillation but she did have brief episodes of atrial tachycardia and occasional PACs and PVCs.  She was found to be profoundly anemic in October 2022 and had upper EGD which showed gastritis.  She was given 2 units of packed red blood cells in the hospital in April 2023.  She had an abscess on her right ankle and antibiotics for several weeks.  Today she continues to be anemic, hemoglobin 8.6, see below.  She is feeling wiped out.  No chest pain no shortness of breath.  No Apple Watch notifications of atrial fibrillation.  Prior monitor showed no atrial fibrillation.  No chest pain.  Utilizes a walker.  Past Medical History:  Diagnosis Date   Arthritis    Basal cell carcinoma    on  her face   Blood transfusion without reported diagnosis 2022   Cataract    bilateral sx   Cellulitis of left leg 07/28/2014   hospitalized for 4 days   Chronic kidney disease    COPD (chronic obstructive pulmonary disease) (HCC)    Emphysema of lung (HCC)    Esophageal web    GERD (gastroesophageal reflux disease)    History of granulomatous disease    Hyperlipidemia    on meds   Hypertension    on meds   Iron  deficiency anemia    Left leg swelling    Paroxysmal atrial fibrillation (HCC)    Right-sided low back pain with sciatica    Seasonal allergies     Past Surgical History:  Procedure Laterality Date   ABDOMINAL HYSTERECTOMY  2000   ANKLE SURGERY Left 1996   APPENDECTOMY  1978   BALLOON DILATION N/A 07/29/2021   Procedure: BALLOON DILATION;  Surgeon: Judith Mayer, MD;  Location: [facility];  Service: Endoscopy;  Laterality: N/A;   BIOPSY  07/29/2021   Procedure: BIOPSY;  Surgeon: Judith Mayer, MD;  Location: [facility];  Service: Endoscopy;;   CATARACT EXTRACTION Bilateral 2013   ESOPHAGOGASTRODUODENOSCOPY (EGD) WITH PROPOFOL N/A 07/29/2021   Procedure: ESOPHAGOGASTRODUODENOSCOPY (EGD) WITH PROPOFOL;  Surgeon: Judith Mayer, MD;  Location: [facility];  Service: Endoscopy;  Laterality: N/A;   TOTAL KNEE ARTHROPLASTY Right 2008   TOTAL KNEE ARTHROPLASTY Left 2012    Current Medications: Current Meds  Medication Sig   amLODipine (NORVASC) 5 MG tablet Take 5 mg by mouth at bedtime.    ANORO ELLIPTA 62.5-25 MCG/INH AEPB Inhale 1 puff into the lungs daily.   Ascorbic Acid (VITAMIN C PO) Take 1 tablet by mouth daily.   atorvastatin (LIPITOR) 20 MG tablet Take 20 mg by mouth at bedtime.    Calcium Citrate-Vitamin  D (CALCIUM + D PO) Take 1 tablet by mouth 2 (two) times daily.   cetirizine (ZYRTEC) 10 MG tablet Take 10 mg by mouth daily as needed for allergies.   famotidine (PEPCID) 20 MG tablet Take 20 mg by mouth daily.   ferrous sulfate 325 (65 FE) MG tablet Take 325 mg by mouth daily.   hydrALAZINE (APRESOLINE) 25 MG tablet Take 25 mg by mouth 2 (two) times daily.    losartan (COZAAR) 50 MG tablet Take 50 mg by mouth daily.   metoprolol succinate (TOPROL-XL) 25 MG 24 hr tablet Take 1 tablet (25 mg total) by mouth daily.   montelukast (SINGULAIR) 10 MG tablet Take 10 mg by mouth daily.   Multiple Vitamin (MULTIVITAMIN) tablet Take 1 tablet by mouth daily.   omeprazole (PRILOSEC)  40 MG capsule Take 40 mg by mouth daily.    OXYGEN Inhale into the lungs. As needed   pramipexole (MIRAPEX) 0.25 MG tablet Take 0.25 mg by mouth at bedtime.   Spacer/Aero-Holding Chambers (AEROCHAMBER MV) inhaler Use as instructed with Albuterol HFA   VENTOLIN HFA 108 (90 BASE) MCG/ACT inhaler Inhale 1-2 puffs into the lungs every 6 (six) hours as needed for wheezing.    VITAMIN D PO Take 1 tablet by mouth daily.   [DISCONTINUED] apixaban (ELIQUIS) 2.5 MG TABS tablet TAKE 1 TABLET (2.5 MG TOTAL) BY MOUTH 2 (TWO) TIMES DAILY     Allergies:   Bacitracin, Neosporin [neomycin-bacitracin zn-polymyx], and Polysporin [bacitracin-polymyxin b]   Social History   Socioeconomic History   Marital status: Widowed    Spouse name: Not on file   Number of children: Not on file   Years of education: Not on file   Highest education level: Not on file  Occupational History   Not on file  Tobacco Use   Smoking status: Former    Packs/day: 2.00    Years: 30.00    Total pack years: 60.00    Types: Cigarettes    Quit date: 04/29/1979    Years since quitting: 43.1   Smokeless tobacco: Never   Tobacco comments:    Former smoker 04/23/2021  Vaping Use   Vaping Use: Never used  Substance and Sexual Activity   Alcohol use: Yes    Alcohol/week: 2.0 - 3.0 standard drinks of alcohol    Types: 2 - 3 Glasses of wine per week    Comment: 1 glass of wine 2-3 times a week 10/22/21   Drug use: No   Sexual activity: Not on file  Other Topics Concern   Not on file  Social History Narrative   Patient is widowed.   She has 1 son and 1 daughter   1-2 alcoholic beverages a week 1 caffeinated beverage daily she is a former smoker not now      Social Determinants of Radio broadcast assistant Strain: Not on file  Food Insecurity: Not on file  Transportation Needs: Not on file  Physical Activity: Not on file  Stress: Not on file  Social Connections: Not on file     Family History: The patient's family  history includes Alcohol abuse in her father; Asthma in her son; Heart disease in her father; Hypertension in her daughter, father, and son. There is no history of Colon polyps, Colon cancer, Esophageal cancer, Rectal cancer, or Stomach cancer.  ROS:   Please see the history of present illness.     All other systems reviewed and are negative.  EKGs/Labs/Other Studies Reviewed:  The following studies were reviewed today:  Echo 10/12/18    1. The left ventricle has normal systolic function with an ejection fraction of 60-65%. The cavity size was normal. Left ventricular diastolic Doppler parameters are consistent with impaired relaxation.  2. The mitral valve is grossly normal.  3. The tricuspid valve is grossly normal.  4. The aortic valve was not well visualized. Aortic valve regurgitation is trivial by color flow Doppler. No stenosis of the aortic valve.  5. Normal LV function; mild diastolic dysfunction; trace AI; mild TR; mildly elevated pulmonary pressure.  EKG:  The ekg ordered today demonstrates   05/30/2022-sinus rhythm 74 first-degree AV block 222 ms, poor R wave progression. PRIOR EKG SR, 1st degree AV block Vent. rate 78 BPM PR interval 256 ms QRS duration 68 ms QT/QTcB 376/428 ms  Recent Labs: 10/22/2021: BUN 29; Hemoglobin 10.5; Platelets 141; Potassium 4.6; Sodium 142 03/19/2022: Creatinine, Ser 1.60  Recent Lipid Panel    Component Value Date/Time   CHOL 215 (H) 09/21/2012 0953   TRIG 100 09/21/2012 0953   HDL 88 09/21/2012 0953   CHOLHDL 2.4 09/21/2012 0953   VLDL 20 09/21/2012 0953   LDLCALC 107 (H) 09/21/2012 0953     Risk Assessment/Calculations:             Physical Exam:    VS:  BP (!) 140/68   Pulse 77   Ht 5' (1.524 m)   Wt 128 lb 3.2 oz (58.2 kg)   SpO2 91%   BMI 25.04 kg/m     Wt Readings from Last 3 Encounters:  05/30/22 128 lb 3.2 oz (58.2 kg)  04/15/22 129 lb 3.2 oz (58.6 kg)  10/22/21 130 lb (59 kg)     GEN:  Well nourished,  well developed in no acute distress HEENT: Normal NECK: No JVD; No carotid bruits LYMPHATICS: No lymphadenopathy CARDIAC: RRR, no murmurs, rubs, gallops RESPIRATORY:  Clear to auscultation without rales, wheezing or rhonchi  ABDOMEN: Soft, non-tender, non-distended MUSCULOSKELETAL:  No edema; No deformity  SKIN: Warm and dry NEUROLOGIC:  Alert and oriented x 3 PSYCHIATRIC:  Normal affect   ASSESSMENT:    1. Paroxysmal atrial fibrillation (HCC)   2. Iron deficiency anemia due to chronic blood loss    PLAN:    In order of problems listed above:  Paroxysmal atrial fibrillation - On metoprolol 25 mg a day.  Apple watch.  She has been maintaining sinus rhythm for quite some time.  No alerts on Apple Watch.  Chronic anticoagulation - Eliquis 2.5 mg twice a day for age and creatinine. -Given her ongoing anemia as described below and no recent episodes of atrial fibrillation, I will stop her Eliquis 2.5 mg twice a day.  We discussed that there is a risk of potential stroke especially if atrial fibrillation returns and if atrial fibrillation does return, or if she is alerted by her Apple Watch, we will need to consider resuming the Eliquis.  I am worried that her ongoing iron deficiency anemia may become catastrophic with her anticoagulation strategy. -CHA2DS2-VASc score is 5.  Hypertension -Stable.  Being careful with metoprolol given her first-degree AV block.  Iron def Anemia -- Ferritin 36, previously 6 and Hgb 8.6  Prior EGD showed gastritis.  She feels wiped out.  I think it makes sense for [place] to stop the Eliquis since she has not experienced any further episodes of atrial fibrillation in quite some time.          Medication  Adjustments/Labs and Tests Ordered: Current medicines are reviewed at length with the patient today.  Concerns regarding medicines are outlined above.  Orders Placed This Encounter  Procedures   EKG 12-Lead   No orders of the defined types were placed  in this encounter.   Patient Instructions  Medication Instructions:  Please discontinue your Eliquis. Continue all other medications as listed.  *If you need a refill on your cardiac medications before your next appointment, please call your pharmacy*  Follow-Up: At [facility], you and your health needs are our priority.  As part of our continuing mission to provide you with exceptional heart care, we have created designated Provider Care Teams.  These Care Teams include your primary Cardiologist (physician) and Advanced Practice Providers (APPs -  Physician Assistants and Nurse Practitioners) who all work together to provide you with the care you need, when you need it.  We recommend signing up for the patient portal called "MyChart".  Sign up information is provided on this After Visit Summary.  MyChart is used to connect with patients for Virtual Visits (Telemedicine).  Patients are able to view lab/test results, encounter notes, upcoming appointments, etc.  Non-urgent messages can be sent to your provider as well.   To learn more about what you can do with MyChart, go to [email or web address].    Your next appointment:   6 month(s)  Provider:   Nicholes Rough, PA-C, Ambrose Pancoast, NP, Ermalinda Barrios, PA-C, Christen Bame, NP, or Richardson Dopp, PA-C     Then, Dr Judith Boone will plan to see you again in 1 year(s).       Signed, Judith Furbish, MD  05/30/2022 3:45 PM    [place]

## 2022-06-06 DIAGNOSIS — Z961 Presence of intraocular lens: Secondary | ICD-10-CM | POA: Diagnosis not present

## 2022-06-06 DIAGNOSIS — H35453 Secondary pigmentary degeneration, bilateral: Secondary | ICD-10-CM | POA: Diagnosis not present

## 2022-06-06 DIAGNOSIS — H35363 Drusen (degenerative) of macula, bilateral: Secondary | ICD-10-CM | POA: Diagnosis not present

## 2022-06-06 DIAGNOSIS — H353211 Exudative age-related macular degeneration, right eye, with active choroidal neovascularization: Secondary | ICD-10-CM | POA: Diagnosis not present

## 2022-06-06 DIAGNOSIS — H353122 Nonexudative age-related macular degeneration, left eye, intermediate dry stage: Secondary | ICD-10-CM | POA: Diagnosis not present

## 2022-06-17 ENCOUNTER — Encounter: Payer: Self-pay | Admitting: Internal Medicine

## 2022-06-17 ENCOUNTER — Other Ambulatory Visit (INDEPENDENT_AMBULATORY_CARE_PROVIDER_SITE_OTHER): Payer: Medicare Other

## 2022-06-17 ENCOUNTER — Ambulatory Visit (INDEPENDENT_AMBULATORY_CARE_PROVIDER_SITE_OTHER): Payer: Medicare Other | Admitting: Internal Medicine

## 2022-06-17 VITALS — BP 120/72 | HR 91 | Ht 60.0 in | Wt 128.4 lb

## 2022-06-17 DIAGNOSIS — K449 Diaphragmatic hernia without obstruction or gangrene: Secondary | ICD-10-CM

## 2022-06-17 DIAGNOSIS — N183 Chronic kidney disease, stage 3 unspecified: Secondary | ICD-10-CM

## 2022-06-17 DIAGNOSIS — K862 Cyst of pancreas: Secondary | ICD-10-CM | POA: Diagnosis not present

## 2022-06-17 DIAGNOSIS — K2950 Unspecified chronic gastritis without bleeding: Secondary | ICD-10-CM

## 2022-06-17 DIAGNOSIS — D509 Iron deficiency anemia, unspecified: Secondary | ICD-10-CM

## 2022-06-17 LAB — IBC + FERRITIN
Ferritin: 36.1 ng/mL (ref 10.0–291.0)
Iron: 47 ug/dL (ref 42–145)
Saturation Ratios: 13.1 % — ABNORMAL LOW (ref 20.0–50.0)
TIBC: 359.8 ug/dL (ref 250.0–450.0)
Transferrin: 257 mg/dL (ref 212.0–360.0)

## 2022-06-17 LAB — CBC WITH DIFFERENTIAL/PLATELET
Basophils Absolute: 0.2 10*3/uL — ABNORMAL HIGH (ref 0.0–0.1)
Basophils Relative: 3.7 % — ABNORMAL HIGH (ref 0.0–3.0)
Eosinophils Absolute: 0.1 10*3/uL (ref 0.0–0.7)
Eosinophils Relative: 2.5 % (ref 0.0–5.0)
HCT: 29.9 % — ABNORMAL LOW (ref 36.0–46.0)
Hemoglobin: 9.7 g/dL — ABNORMAL LOW (ref 12.0–15.0)
Lymphocytes Relative: 21.4 % (ref 12.0–46.0)
Lymphs Abs: 1.1 10*3/uL (ref 0.7–4.0)
MCHC: 32.5 g/dL (ref 30.0–36.0)
MCV: 80.1 fl (ref 78.0–100.0)
Monocytes Absolute: 0.5 10*3/uL (ref 0.1–1.0)
Monocytes Relative: 9.2 % (ref 3.0–12.0)
Neutro Abs: 3.3 10*3/uL (ref 1.4–7.7)
Neutrophils Relative %: 63.2 % (ref 43.0–77.0)
Platelets: 158 10*3/uL (ref 150.0–400.0)
RBC: 3.73 Mil/uL — ABNORMAL LOW (ref 3.87–5.11)
RDW: 24.3 % — ABNORMAL HIGH (ref 11.5–15.5)
WBC: 5.2 10*3/uL (ref 4.0–10.5)

## 2022-06-17 LAB — VITAMIN B12: Vitamin B-12: >1500 pg/mL — ABNORMAL HIGH (ref 211–911)

## 2022-06-17 LAB — BASIC METABOLIC PANEL
BUN: 34 mg/dL — ABNORMAL HIGH (ref 6–23)
CO2: 22 mEq/L (ref 19–32)
Calcium: 9 mg/dL (ref 8.4–10.5)
Chloride: 108 mEq/L (ref 96–112)
Creatinine, Ser: 1.65 mg/dL — ABNORMAL HIGH (ref 0.40–1.20)
GFR: 27.54 mL/min — ABNORMAL LOW (ref >60.00)
Glucose, Bld: 102 mg/dL — ABNORMAL HIGH (ref 70–99)
Potassium: 5.2 mEq/L — ABNORMAL HIGH (ref 3.5–5.1)
Sodium: 140 mEq/L (ref 135–145)

## 2022-06-17 LAB — FOLATE: Folate: >23.9 ng/mL (ref >5.9)

## 2022-06-17 NOTE — Progress Notes (Signed)
Judith Boone 89 y.o. 1931-10-17 [id number]  Assessment & Plan:   Encounter Diagnoses  Name Primary?   Iron deficiency anemia, unspecified iron deficiency anemia type Yes   Hiatal hernia    Other chronic gastritis, presence of bleeding unspecified    Stage 3 chronic kidney disease, unspecified whether stage 3a or 3b CKD (HCC)    Pancreas cyst    I want to reassess with labs to determine next steps.  She is elderly and frail.  We had considered colonoscopy last year but decided not to pursue it.  She is open to the idea but certainly not enthusiastic which is appropriate.  I am not convinced she needs 1, either.  She recently stopped Eliquis and that certainly could help from a chronic blood loss iron deficiency anemia as I suspect.  She is on home O2 elderly and frail so any endoscopic procedures would need to be done at the hospital.  She also may have a component of anemia of chronic disease given her chronic kidney dysfunction.  Her changes in iron administration i.e. not in the setting of her vitamins including calcium and using some orange juice is sensible.  May need to consider parenteral iron.  She has 2 small cystic lesions in the pancreas.  Statistically these are benign and unlikely to ever be a problem.  I think I reassured her about this today as she has some concern.  I do not think I would put her on a surveillance protocol of these based upon her age though it could be considered 2 years after that CT.  I do not think they have any relationship to her anemia.  Lab workup as below:  Orders Placed This Encounter  Procedures   CBC w/Diff   Basic Metabolic Panel (BMET)   123456   Folate   IBC + Ferritin   Tissue transglutaminase, IgA   IgA     Subjective:   Chief Complaint: Persistent anemia  HPI 89 year old white woman with iron deficiency anemia thought to be due from chronic blood loss from gastritis she also has a very large hiatal hernia.  This was determined  at EGD in April 2023, procedure performed at the hospital due to chronic oxygen use.  I did not see Cameron erosions.  She was treated with iron therapy and reports that her hemoglobin did climb but it has fallen to 8.6 in November and January.  She has been faithful with her iron therapy but recently changed because she read that she should not be taking with her vitamins as it might be bound up by calcium.  She is taking it later in the morning with some orange juice.  She does not notice any bleeding.  Appetite is also and there is been slight weight loss.  She lives by herself and cooks for herself and finds that a challenge at times.  She has some pills but no food dysphagia.  She has chronically alternating bowel habits that are unchanged.  She has fatigue with the anemia. Wt Readings from Last 3 Encounters:  06/17/22 128 lb 6.4 oz (58.2 kg)  05/30/22 128 lb 3.2 oz (58.2 kg)  04/15/22 129 lb 3.2 oz (58.6 kg)  135# 12/22  She reports Eliquis was discontinued recently.  She has not had any episodes of A-fib.  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 Marlou Porch was concerned about her anemia and elected to discontinue Eliquis on 05/30/2022.  I have reviewed that note.  CT  abdomen pelvis with contrast 03/19/2022 (urinary retention and symptoms was the indication) IMPRESSION: 1. Mild distention of the urinary bladder. There is associated pelvic floor laxity with cystocele. No acute findings in the abdomen or pelvis. Otherwise, no specific findings to explain the patient's history of urinary retention. 2. Moderate to large hiatal hernia. 3. Diffuse colonic diverticulosis without diverticulitis. 4. 12 mm cystic focus in the head of the pancreas with another adjacent 17 mm cystic focus on 31/2. These are likely benign but consider follow-up MRI of the abdomen with and without contrast in 24 months to ensure stability. This recommendation follows ACR consensus guidelines: Management of Incidental  Pancreatic Cysts: A White Paper of the ACR Incidental Findings Committee. J Am Coll Radiol [id number]. 5.  Aortic Atherosclerosis (ICD10-I70.0) Allergies  Allergen Reactions   Bacitracin     Makes wound worse    Neosporin [Neomycin-Bacitracin Zn-Polymyx]     Makes wound worse    Polysporin [Bacitracin-Polymyxin B]     Makes wound worse    Current Meds  Medication Sig   amLODipine (NORVASC) 5 MG tablet Take 5 mg by mouth at bedtime.    ANORO ELLIPTA 62.5-25 MCG/INH AEPB Inhale 1 puff into the lungs daily.   Ascorbic Acid (VITAMIN C PO) Take 1 tablet by mouth daily.   atorvastatin (LIPITOR) 20 MG tablet Take 20 mg by mouth at bedtime.    Calcium Citrate-Vitamin D (CALCIUM + D PO) Take 1 tablet by mouth 2 (two) times daily.   cetirizine (ZYRTEC) 10 MG tablet Take 10 mg by mouth daily as needed for allergies.   famotidine (PEPCID) 20 MG tablet Take 20 mg by mouth daily.   ferrous sulfate 325 (65 FE) MG tablet Take 325 mg by mouth daily.   hydrALAZINE (APRESOLINE) 25 MG tablet Take 25 mg by mouth 2 (two) times daily.    losartan (COZAAR) 50 MG tablet Take 50 mg by mouth daily.   metoprolol succinate (TOPROL-XL) 25 MG 24 hr tablet Take 1 tablet (25 mg total) by mouth daily.   montelukast (SINGULAIR) 10 MG tablet Take 10 mg by mouth daily.   Multiple Vitamin (MULTIVITAMIN) tablet Take 1 tablet by mouth daily.   omeprazole (PRILOSEC) 40 MG capsule Take 40 mg by mouth daily.    OXYGEN Inhale into the lungs. As needed   pramipexole (MIRAPEX) 0.25 MG tablet Take 0.25 mg by mouth at bedtime.   Spacer/Aero-Holding Chambers (AEROCHAMBER MV) inhaler Use as instructed with Albuterol HFA   VENTOLIN HFA 108 (90 BASE) MCG/ACT inhaler Inhale 1-2 puffs into the lungs every 6 (six) hours as needed for wheezing.    VITAMIN D PO Take 1 tablet by mouth daily.   Past Medical History:  Diagnosis Date   Arthritis    Basal cell carcinoma    on  her face   Blood transfusion without reported diagnosis  2022   Cataract    bilateral sx   Cellulitis of left leg 07/28/2014   hospitalized for 4 days   Chronic kidney disease    COPD (chronic obstructive pulmonary disease) (HCC)    Emphysema of lung (HCC)    Esophageal web    GERD (gastroesophageal reflux disease)    History of granulomatous disease    Hyperlipidemia    on meds   Hypertension    on meds   Iron deficiency anemia    Left leg swelling    Paroxysmal atrial fibrillation (HCC)    Right-sided low back pain with sciatica  Seasonal allergies    Past Surgical History:  Procedure Laterality Date   ABDOMINAL HYSTERECTOMY  2000   ANKLE SURGERY Left 1996   APPENDECTOMY  1978   BALLOON DILATION N/A 07/29/2021   Procedure: BALLOON DILATION;  Surgeon: Gatha Mayer, MD;  Location: [facility];  Service: Endoscopy;  Laterality: N/A;   BIOPSY  07/29/2021   Procedure: BIOPSY;  Surgeon: Gatha Mayer, MD;  Location: [facility];  Service: Endoscopy;;   CATARACT EXTRACTION Bilateral 2013   ESOPHAGOGASTRODUODENOSCOPY (EGD) WITH PROPOFOL N/A 07/29/2021   Procedure: ESOPHAGOGASTRODUODENOSCOPY (EGD) WITH PROPOFOL;  Surgeon: Gatha Mayer, MD;  Location: [facility];  Service: Endoscopy;  Laterality: N/A;   TOTAL KNEE ARTHROPLASTY Right 2008   TOTAL KNEE ARTHROPLASTY Left 2012   Social History   Social History Narrative   Patient is widowed.   She has 1 son and 1 daughter   1-2 alcoholic beverages a week 1 caffeinated beverage daily she is a former smoker not now      family history includes Alcohol abuse in her father; Asthma in her son; Heart disease in her father; Hypertension in her daughter, father, and son.   Review of Systems As per HPI she ambulates with the aid of a cane  Objective:   Physical Exam BP 120/72   Pulse 91   Ht 5' (1.524 m)   Wt 128 lb 6.4 oz (58.2 kg)   SpO2 95%   BMI 25.08 kg/m  Elderly ww, NAD very kyphotic Lungs diffusely decreased BS Cor distant S1s2 Abd soft and NT Alert and oriented x  3  Data reviewed includes those things mentioned in the HPI

## 2022-06-17 NOTE — Patient Instructions (Addendum)
  Your provider has requested that you go to the basement level for lab work before leaving today. Press "B" on the elevator. The lab is located at the first door on the left as you exit the elevator. _______________________________________________________  If your blood pressure at your visit was 140/90 or greater, please contact your primary care physician to follow up on this.  _______________________________________________________  If you are age 87 or older, your body mass index should be between 23-30. Your Body mass index is 25.08 kg/m. If this is out of the aforementioned range listed, please consider follow up with your Primary Care Provider.  If you are age 44 or younger, your body mass index should be between 19-25. Your Body mass index is 25.08 kg/m. If this is out of the aformentioned range listed, please consider follow up with your Primary Care Provider.   ________________________________________________________  The [place] providers would like to encourage you to use [facility] to communicate with providers for non-urgent requests or questions.  Due to long hold times on the telephone, sending your provider a message by [facility] may be a faster and more efficient way to get a response.  Please allow 48 business hours for a response.  Please remember that this is for non-urgent requests.  _______________________________________________________ Thank you for trusting me with your gastrointestinal care!   Silvano Rusk MD

## 2022-06-18 LAB — IGA: Immunoglobulin A: 297 mg/dL (ref 70–320)

## 2022-06-18 LAB — TISSUE TRANSGLUTAMINASE, IGA: (tTG) Ab, IgA: <1 U/mL

## 2022-06-23 ENCOUNTER — Other Ambulatory Visit: Payer: Self-pay

## 2022-06-23 ENCOUNTER — Telehealth: Payer: Self-pay | Admitting: Internal Medicine

## 2022-06-23 DIAGNOSIS — M545 Low back pain, unspecified: Secondary | ICD-10-CM | POA: Diagnosis not present

## 2022-06-23 DIAGNOSIS — D509 Iron deficiency anemia, unspecified: Secondary | ICD-10-CM

## 2022-06-23 NOTE — Telephone Encounter (Signed)
Inbound call from patient states she is returning call.   Please advise.

## 2022-06-23 NOTE — Telephone Encounter (Signed)
Spoke with pt.  Documented under results notes.  Pt verbalized understanding with all questions answered.

## 2022-06-24 DIAGNOSIS — H353211 Exudative age-related macular degeneration, right eye, with active choroidal neovascularization: Secondary | ICD-10-CM | POA: Diagnosis not present

## 2022-07-01 DIAGNOSIS — S39012D Strain of muscle, fascia and tendon of lower back, subsequent encounter: Secondary | ICD-10-CM | POA: Diagnosis not present

## 2022-07-01 DIAGNOSIS — M545 Low back pain, unspecified: Secondary | ICD-10-CM | POA: Diagnosis not present

## 2022-07-14 DIAGNOSIS — M545 Low back pain, unspecified: Secondary | ICD-10-CM | POA: Diagnosis not present

## 2022-07-14 DIAGNOSIS — M25512 Pain in left shoulder: Secondary | ICD-10-CM | POA: Diagnosis not present

## 2022-07-21 ENCOUNTER — Other Ambulatory Visit (INDEPENDENT_AMBULATORY_CARE_PROVIDER_SITE_OTHER): Payer: Medicare Other

## 2022-07-21 DIAGNOSIS — D509 Iron deficiency anemia, unspecified: Secondary | ICD-10-CM

## 2022-07-21 LAB — CBC WITH DIFFERENTIAL/PLATELET
Basophils Absolute: 0.1 10*3/uL (ref 0.0–0.1)
Basophils Relative: 1.9 % (ref 0.0–3.0)
Eosinophils Absolute: 0.1 10*3/uL (ref 0.0–0.7)
Eosinophils Relative: 1.3 % (ref 0.0–5.0)
HCT: 32.7 % — ABNORMAL LOW (ref 36.0–46.0)
Hemoglobin: 10.7 g/dL — ABNORMAL LOW (ref 12.0–15.0)
Lymphocytes Relative: 21.1 % (ref 12.0–46.0)
Lymphs Abs: 1.3 10*3/uL (ref 0.7–4.0)
MCHC: 32.6 g/dL (ref 30.0–36.0)
MCV: 78.7 fl (ref 78.0–100.0)
Monocytes Absolute: 0.7 10*3/uL (ref 0.1–1.0)
Monocytes Relative: 10.8 % (ref 3.0–12.0)
Neutro Abs: 4.1 10*3/uL (ref 1.4–7.7)
Neutrophils Relative %: 64.9 % (ref 43.0–77.0)
Platelets: 125 10*3/uL — ABNORMAL LOW (ref 150.0–400.0)
RBC: 4.16 Mil/uL (ref 3.87–5.11)
RDW: 22.5 % — ABNORMAL HIGH (ref 11.5–15.5)
WBC: 6.3 10*3/uL (ref 4.0–10.5)

## 2022-07-22 DIAGNOSIS — M545 Low back pain, unspecified: Secondary | ICD-10-CM | POA: Diagnosis not present

## 2022-07-22 LAB — CBC WITH DIFFERENTIAL/PLATELET
Absolute Monocytes: 706 cells/uL (ref 200–950)
Basophils Absolute: 113 cells/uL (ref 0–200)
Basophils Relative: 1.8 %
Eosinophils Absolute: 69 cells/uL (ref 15–500)
Eosinophils Relative: 1.1 %
HCT: 32.1 % — ABNORMAL LOW (ref 35.0–45.0)
Hemoglobin: 10.3 g/dL — ABNORMAL LOW (ref 11.7–15.5)
Lymphs Abs: 1380 cells/uL (ref 850–3900)
MCH: 25.6 pg — ABNORMAL LOW (ref 27.0–33.0)
MCHC: 32.1 g/dL (ref 32.0–36.0)
MCV: 79.9 fL — ABNORMAL LOW (ref 80.0–100.0)
Monocytes Relative: 11.2 %
Neutro Abs: 4032 cells/uL (ref 1500–7800)
Neutrophils Relative %: 64 %
Platelets: 123 10*3/uL — ABNORMAL LOW (ref 140–400)
RBC: 4.02 10*6/uL (ref 3.80–5.10)
RDW: 19.8 % — ABNORMAL HIGH (ref 11.0–15.0)
Total Lymphocyte: 21.9 %
WBC: 6.3 10*3/uL (ref 3.8–10.8)

## 2022-07-24 ENCOUNTER — Encounter: Payer: Self-pay | Admitting: Internal Medicine

## 2022-07-24 ENCOUNTER — Ambulatory Visit (INDEPENDENT_AMBULATORY_CARE_PROVIDER_SITE_OTHER): Payer: Medicare Other | Admitting: Internal Medicine

## 2022-07-24 VITALS — BP 124/70 | HR 84 | Ht 60.0 in | Wt 127.0 lb

## 2022-07-24 DIAGNOSIS — K2950 Unspecified chronic gastritis without bleeding: Secondary | ICD-10-CM

## 2022-07-24 DIAGNOSIS — N183 Chronic kidney disease, stage 3 unspecified: Secondary | ICD-10-CM | POA: Diagnosis not present

## 2022-07-24 DIAGNOSIS — D509 Iron deficiency anemia, unspecified: Secondary | ICD-10-CM

## 2022-07-24 DIAGNOSIS — M545 Low back pain, unspecified: Secondary | ICD-10-CM | POA: Diagnosis not present

## 2022-07-24 DIAGNOSIS — D696 Thrombocytopenia, unspecified: Secondary | ICD-10-CM

## 2022-07-24 DIAGNOSIS — K449 Diaphragmatic hernia without obstruction or gangrene: Secondary | ICD-10-CM | POA: Diagnosis not present

## 2022-07-24 NOTE — Patient Instructions (Addendum)
_Come and have your labs drawn prior to  your next appointment. ______________________________________________________  If your blood pressure at your visit was 140/90 or greater, please contact your primary care physician to follow up on this.  _______________________________________________________  If you are age 87 or older, your body mass index should be between 23-30. Your Body mass index is 24.8 kg/m. If this is out of the aforementioned range listed, please consider follow up with your Primary Care Provider.  If you are age 40 or younger, your body mass index should be between 19-25. Your Body mass index is 24.8 kg/m. If this is out of the aformentioned range listed, please consider follow up with your Primary Care Provider.   ________________________________________________________  The [place] providers would like to encourage you to use [facility] to communicate with providers for non-urgent requests or questions.  Due to long hold times on the telephone, sending your provider a message by [facility] may be a faster and more efficient way to get a response.  Please allow 48 business hours for a response.  Please remember that this is for non-urgent requests.  _______________________________________________________  I appreciate the opportunity to care for you. Silvano Rusk, MD, [facility]

## 2022-07-24 NOTE — Progress Notes (Signed)
Judith Boone 89 y.o. 22-Jun-1931 IN:[id number]  Assessment & Plan:   Encounter Diagnoses  Name Primary?   Iron deficiency anemia, unspecified iron deficiency anemia type Yes   Hiatal hernia    Other chronic gastritis, presence of bleeding unspecified    Thrombocytopenia (HCC)    Stage 3 chronic kidney disease, unspecified whether stage 3a or 3b CKD (HCC)    Still anemic but improving. Mild thrombocytopenia is new.  Will follow-up with repeat labs. Slight schistocytes seen on smear unclear significance I would not do anything different at this point. Continue current therapy with iron supplementation.  Follow-up in about 2 months.  Labs just prior to that visit.  Orders Placed This Encounter  Procedures   CBC w/Diff   Ferritin   CC: Velna Hatchet, MD     Subjective:   Chief Complaint: Iron deficiency anemia  HPI 89 year old white woman with iron deficiency anemia thought to be due from chronic blood loss from gastritis she also has a very large hiatal hernia.  This was determined at EGD in April 2023, procedure performed at the hospital due to chronic oxygen use.  I did not see Cameron erosions.  She was treated with iron therapy and reports that her hemoglobin did climb but it has fallen to 8.6 in November and January.  I saw her on February 20 and repeated labs, we decided to continue iron therapy and decided not to pursue a colonoscopy.  At the time I thought perhaps there was a combination of chronic disease anemia, she had come off anticoagulation therapy as well.  She reports she is a little less fatigued.  Still has some pill dysphagia.  No signs of bleeding and bowel habits are regular.  A few times a week she will have some nausea with her evening meal.  No pain.  No vomiting.     Latest Ref Rng & Units 07/21/2022   12:17 PM 07/21/2022   10:11 AM 06/17/2022   11:42 AM  CBC  WBC 3.8 - 10.8 Thousand/uL 6.3  6.3  5.2   Hemoglobin 11.7 - 15.5 g/dL 10.3  10.7  9.7    Hematocrit 35.0 - 45.0 % 32.1  32.7  29.9   Platelets 140 - 400 Thousand/uL 123  125.0 Repeated and verified X2.  158.0     Lab Results  Component Value Date   VITAMINB12 >1500 (H) 06/17/2022   Lab Results  Component Value Date   IRON 47 06/17/2022   TIBC 359.8 06/17/2022   FERRITIN 36.1 06/17/2022   Lab Results  Component Value Date   FOLATE >23.9 06/17/2022   Lab Results  Component Value Date   CREATININE 1.65 (H) 06/17/2022   BUN 34 (H) 06/17/2022   NA 140 06/17/2022   K 5.2 (H) 06/17/2022   CL 108 06/17/2022   CO2 22 06/17/2022   Tissue transglutaminase antibody and IgA level are normal. Wt Readings from Last 3 Encounters:  07/24/22 127 lb (57.6 kg)  06/17/22 128 lb 6.4 oz (58.2 kg)  05/30/22 128 lb 3.2 oz (58.2 kg)    Allergies  Allergen Reactions   Bacitracin     Makes wound worse    Neosporin [Neomycin-Bacitracin Zn-Polymyx]     Makes wound worse    Polysporin [Bacitracin-Polymyxin B]     Makes wound worse    Current Meds  Medication Sig   amLODipine (NORVASC) 5 MG tablet Take 5 mg by mouth at bedtime.    ANORO ELLIPTA 62.5-25 MCG/INH AEPB  Inhale 1 puff into the lungs daily.   Ascorbic Acid (VITAMIN C PO) Take 1 tablet by mouth daily.   atorvastatin (LIPITOR) 20 MG tablet Take 20 mg by mouth at bedtime.    Calcium Citrate-Vitamin D (CALCIUM + D PO) Take 1 tablet by mouth 2 (two) times daily.   cetirizine (ZYRTEC) 10 MG tablet Take 10 mg by mouth daily as needed for allergies.   Cholecalciferol 25 MCG (1000 UT) capsule Take 1,000 Units by mouth daily.   famotidine (PEPCID) 20 MG tablet Take 20 mg by mouth daily.   ferrous sulfate 325 (65 FE) MG tablet Take 325 mg by mouth daily.   hydrALAZINE (APRESOLINE) 25 MG tablet Take 25 mg by mouth 2 (two) times daily.    losartan (COZAAR) 50 MG tablet Take 50 mg by mouth daily.   metoprolol succinate (TOPROL-XL) 25 MG 24 hr tablet Take 1 tablet (25 mg total) by mouth daily.   montelukast (SINGULAIR) 10 MG tablet  Take 10 mg by mouth daily.   Multiple Vitamin (MULTIVITAMIN) tablet Take 1 tablet by mouth daily.   omeprazole (PRILOSEC) 40 MG capsule Take 40 mg by mouth daily.    OXYGEN Inhale into the lungs. As needed   pramipexole (MIRAPEX) 0.25 MG tablet Take 0.25 mg by mouth at bedtime.   Spacer/Aero-Holding Chambers (AEROCHAMBER MV) inhaler Use as instructed with Albuterol HFA   VENTOLIN HFA 108 (90 BASE) MCG/ACT inhaler Inhale 1-2 puffs into the lungs every 6 (six) hours as needed for wheezing.    VITAMIN D PO Take 1 tablet by mouth daily.   Past Medical History:  Diagnosis Date   Arthritis    Basal cell carcinoma    on  her face   Blood transfusion without reported diagnosis 2022   Cataract    bilateral sx   Cellulitis of left leg 07/28/2014   hospitalized for 4 days   Chronic kidney disease    COPD (chronic obstructive pulmonary disease) (HCC)    Emphysema of lung (HCC)    Esophageal web    GERD (gastroesophageal reflux disease)    History of granulomatous disease    Hyperlipidemia    on meds   Hypertension    on meds   Iron deficiency anemia    Left leg swelling    Paroxysmal atrial fibrillation (HCC)    Right-sided low back pain with sciatica    Seasonal allergies    Past Surgical History:  Procedure Laterality Date   ABDOMINAL HYSTERECTOMY  2000   ANKLE SURGERY Left 1996   APPENDECTOMY  1978   BALLOON DILATION N/A 07/29/2021   Procedure: BALLOON DILATION;  Surgeon: Gatha Mayer, MD;  Location: [facility];  Service: Endoscopy;  Laterality: N/A;   BIOPSY  07/29/2021   Procedure: BIOPSY;  Surgeon: Gatha Mayer, MD;  Location: [facility];  Service: Endoscopy;;   CATARACT EXTRACTION Bilateral 2013   ESOPHAGOGASTRODUODENOSCOPY (EGD) WITH PROPOFOL N/A 07/29/2021   Procedure: ESOPHAGOGASTRODUODENOSCOPY (EGD) WITH PROPOFOL;  Surgeon: Gatha Mayer, MD;  [facility]: [facility];  Service: Endoscopy;  Laterality: N/A;   TOTAL KNEE ARTHROPLASTY Right 2008   TOTAL KNEE  ARTHROPLASTY Left 2012   Social History   Social History Narrative   Patient is widowed.   She has 1 son and 1 daughter   1-2 alcoholic beverages a week 1 caffeinated beverage daily she is a former smoker not now      family history includes Alcohol abuse in her father; Asthma in her son;  Heart disease in her father; Hypertension in her daughter, father, and son.   Review of Systems As per HPI  Objective:   Physical Exam BP 124/70 (BP Location: Left Arm, Patient Position: Sitting, Cuff Size: Normal)   Pulse 84   Ht 5' (1.524 m)   Wt 127 lb (57.6 kg)   BMI 24.80 kg/m  Kyphotic elderly white woman no acute distress, ambulates with the help of a walker Abdomen is soft and nontender without mass

## 2022-07-29 DIAGNOSIS — H353211 Exudative age-related macular degeneration, right eye, with active choroidal neovascularization: Secondary | ICD-10-CM | POA: Diagnosis not present

## 2022-08-11 DIAGNOSIS — R1310 Dysphagia, unspecified: Secondary | ICD-10-CM | POA: Diagnosis not present

## 2022-08-11 DIAGNOSIS — E611 Iron deficiency: Secondary | ICD-10-CM | POA: Diagnosis not present

## 2022-08-11 DIAGNOSIS — I872 Venous insufficiency (chronic) (peripheral): Secondary | ICD-10-CM | POA: Diagnosis not present

## 2022-08-11 DIAGNOSIS — I700 Atherosclerosis of aorta: Secondary | ICD-10-CM | POA: Diagnosis not present

## 2022-08-11 DIAGNOSIS — I4891 Unspecified atrial fibrillation: Secondary | ICD-10-CM | POA: Diagnosis not present

## 2022-08-11 DIAGNOSIS — Z9981 Dependence on supplemental oxygen: Secondary | ICD-10-CM | POA: Diagnosis not present

## 2022-08-11 DIAGNOSIS — K2950 Unspecified chronic gastritis without bleeding: Secondary | ICD-10-CM | POA: Diagnosis not present

## 2022-08-11 DIAGNOSIS — I130 Hypertensive heart and chronic kidney disease with heart failure and stage 1 through stage 4 chronic kidney disease, or unspecified chronic kidney disease: Secondary | ICD-10-CM | POA: Diagnosis not present

## 2022-08-11 DIAGNOSIS — J449 Chronic obstructive pulmonary disease, unspecified: Secondary | ICD-10-CM | POA: Diagnosis not present

## 2022-08-11 DIAGNOSIS — D6869 Other thrombophilia: Secondary | ICD-10-CM | POA: Diagnosis not present

## 2022-08-11 DIAGNOSIS — J9611 Chronic respiratory failure with hypoxia: Secondary | ICD-10-CM | POA: Diagnosis not present

## 2022-08-11 DIAGNOSIS — D692 Other nonthrombocytopenic purpura: Secondary | ICD-10-CM | POA: Diagnosis not present

## 2022-09-02 DIAGNOSIS — H353211 Exudative age-related macular degeneration, right eye, with active choroidal neovascularization: Secondary | ICD-10-CM | POA: Diagnosis not present

## 2022-09-24 DIAGNOSIS — R0602 Shortness of breath: Secondary | ICD-10-CM | POA: Diagnosis not present

## 2022-09-24 DIAGNOSIS — I10 Essential (primary) hypertension: Secondary | ICD-10-CM | POA: Diagnosis not present

## 2022-09-24 DIAGNOSIS — R0781 Pleurodynia: Secondary | ICD-10-CM | POA: Diagnosis not present

## 2022-09-24 DIAGNOSIS — I4891 Unspecified atrial fibrillation: Secondary | ICD-10-CM | POA: Diagnosis not present

## 2022-09-24 DIAGNOSIS — J449 Chronic obstructive pulmonary disease, unspecified: Secondary | ICD-10-CM | POA: Diagnosis not present

## 2022-10-06 ENCOUNTER — Other Ambulatory Visit (INDEPENDENT_AMBULATORY_CARE_PROVIDER_SITE_OTHER): Payer: Medicare Other

## 2022-10-06 DIAGNOSIS — D509 Iron deficiency anemia, unspecified: Secondary | ICD-10-CM | POA: Diagnosis not present

## 2022-10-06 DIAGNOSIS — K449 Diaphragmatic hernia without obstruction or gangrene: Secondary | ICD-10-CM | POA: Diagnosis not present

## 2022-10-06 DIAGNOSIS — K2950 Unspecified chronic gastritis without bleeding: Secondary | ICD-10-CM | POA: Diagnosis not present

## 2022-10-06 LAB — CBC WITH DIFFERENTIAL/PLATELET
Basophils Absolute: 0.1 10*3/uL (ref 0.0–0.1)
Basophils Relative: 1.8 % (ref 0.0–3.0)
Eosinophils Absolute: 0.1 10*3/uL (ref 0.0–0.7)
Eosinophils Relative: 1.3 % (ref 0.0–5.0)
HCT: 33.5 % — ABNORMAL LOW (ref 36.0–46.0)
Hemoglobin: 10.7 g/dL — ABNORMAL LOW (ref 12.0–15.0)
Lymphocytes Relative: 18.3 % (ref 12.0–46.0)
Lymphs Abs: 1.3 10*3/uL (ref 0.7–4.0)
MCHC: 31.9 g/dL (ref 30.0–36.0)
MCV: 78.1 fl (ref 78.0–100.0)
Monocytes Absolute: 0.8 10*3/uL (ref 0.1–1.0)
Monocytes Relative: 11.4 % (ref 3.0–12.0)
Neutro Abs: 4.7 10*3/uL (ref 1.4–7.7)
Neutrophils Relative %: 67.2 % (ref 43.0–77.0)
Platelets: 129 10*3/uL — ABNORMAL LOW (ref 150.0–400.0)
RBC: 4.29 Mil/uL (ref 3.87–5.11)
RDW: 22.7 % — ABNORMAL HIGH (ref 11.5–15.5)
WBC: 7 10*3/uL (ref 4.0–10.5)

## 2022-10-06 LAB — FERRITIN: Ferritin: 53 ng/mL (ref 10.0–291.0)

## 2022-10-10 ENCOUNTER — Other Ambulatory Visit: Payer: Medicare Other

## 2022-10-10 ENCOUNTER — Ambulatory Visit (INDEPENDENT_AMBULATORY_CARE_PROVIDER_SITE_OTHER): Payer: Medicare Other | Admitting: Internal Medicine

## 2022-10-10 ENCOUNTER — Encounter: Payer: Self-pay | Admitting: Internal Medicine

## 2022-10-10 VITALS — BP 134/70 | HR 89 | Ht 60.0 in | Wt 130.0 lb

## 2022-10-10 DIAGNOSIS — D509 Iron deficiency anemia, unspecified: Secondary | ICD-10-CM

## 2022-10-10 DIAGNOSIS — D696 Thrombocytopenia, unspecified: Secondary | ICD-10-CM | POA: Diagnosis not present

## 2022-10-10 NOTE — Progress Notes (Signed)
Judith Boone 89 y.o. June 21, 1931 [id number]  Assessment & Plan:   Encounter Diagnoses  Name Primary?   Microcytic anemia Yes   Thrombocytopenia (HCC)    Iron deficiency anemia, unspecified iron deficiency anemia type    Refer to hematology.  Her iron is improved and normal though not optimal, but hemoglobin not rising as I would expect and she has a thrombocytopenia issue that has developed as well.  Question if there is some sort of a bone marrow process.  At 41 that is certainly more likely.  I am going to check an iFOBT.  I think a guaiac test would be positive given what we know about her gastritis issues and that would not be helpful.  If she is iFOBT positive consider a colonoscopy though she and I have both been trying to avoid that given her frailty.  Continue ferrous sulfate.  Further plans pending hematologist opinion.  CC: [name], MD  Subjective:   Chief Complaint: Follow-up of iron deficiency anemia  HPI  89 year old white woman with iron deficiency anemia thought to be due from chronic blood loss from gastritis she also has a very large hiatal hernia.  This was determined at EGD in April 2023, procedure performed at the hospital due to chronic oxygen use.  I did not see Cameron erosions.  She was treated with iron therapy and reports that her hemoglobin did climb but it has fell to 8.6 in November and January.  I saw her on February 20 and repeated labs, we decided to continue iron therapy and decided not to pursue a colonoscopy.  At the time I thought perhaps there was a combination of chronic disease anemia, she had come off anticoagulation therapy as well.  She was then seen in March on the 28th, hemoglobin was 10.3 platelets 123 which was new, and continued on iron therapy and this follow-up was planned.   She reports she is improved, feeling a little stronger.  She has not noted any bleeding.  No significant dysphagia problems, no abdominal pain.  Still  somewhat frail ambulates with a walker but is still fairly independent overall.    Latest Ref Rng & Units 10/06/2022   10:40 AM 07/21/2022   12:17 PM 07/21/2022   10:11 AM  CBC  WBC 4.0 - 10.5 K/uL 7.0  6.3  6.3   Hemoglobin 12.0 - 15.0 g/dL 40.9  81.1  91.4   Hematocrit 36.0 - 46.0 % 33.5  32.1  32.7   Platelets 150.0 - 400.0 K/uL 129.0 Repeated and verified X2.  123  125.0 Repeated and verified X2.    Lab Results  Component Value Date   FERRITIN 53.0 10/06/2022   Wt Readings from Last 3 Encounters:  10/10/22 130 lb (59 kg)  07/24/22 127 lb (57.6 kg)  06/17/22 128 lb 6.4 oz (58.2 kg)     Current Meds  Medication Sig   amLODipine (NORVASC) 5 MG tablet Take 5 mg by mouth at bedtime.    ANORO ELLIPTA 62.5-25 MCG/INH AEPB Inhale 1 puff into the lungs daily.   Ascorbic Acid (VITAMIN C PO) Take 1 tablet by mouth daily.   atorvastatin (LIPITOR) 20 MG tablet Take 20 mg by mouth at bedtime.    Calcium Citrate-Vitamin D (CALCIUM + D PO) Take 1 tablet by mouth 2 (two) times daily.   cetirizine (ZYRTEC) 10 MG tablet Take 10 mg by mouth daily as needed for allergies.   Cholecalciferol 25 MCG (1000 UT) capsule Take 1,000  Units by mouth daily.   famotidine (PEPCID) 20 MG tablet Take 20 mg by mouth daily.   ferrous sulfate 325 (65 FE) MG tablet Take 325 mg by mouth daily.   hydrALAZINE (APRESOLINE) 25 MG tablet Take 25 mg by mouth 2 (two) times daily.    losartan (COZAAR) 50 MG tablet Take 50 mg by mouth daily.   metoprolol succinate (TOPROL-XL) 25 MG 24 hr tablet Take 1 tablet (25 mg total) by mouth daily.   montelukast (SINGULAIR) 10 MG tablet Take 10 mg by mouth daily.   Multiple Vitamin (MULTIVITAMIN) tablet Take 1 tablet by mouth daily.   omeprazole (PRILOSEC) 40 MG capsule Take 40 mg by mouth daily.    OXYGEN Inhale into the lungs. As needed   pramipexole (MIRAPEX) 0.25 MG tablet Take 0.25 mg by mouth at bedtime.   Spacer/Aero-Holding Chambers (AEROCHAMBER MV) inhaler Use as instructed  with Albuterol HFA   VENTOLIN HFA 108 (90 BASE) MCG/ACT inhaler Inhale 1-2 puffs into the lungs every 6 (six) hours as needed for wheezing.    VITAMIN D PO Take 1 tablet by mouth daily.   Past Medical History:  Diagnosis Date   Arthritis    Basal cell carcinoma    on  her face   Blood transfusion without reported diagnosis 2022   Cataract    bilateral sx   Cellulitis of left leg 07/28/2014   hospitalized for 4 days   Chronic kidney disease    COPD (chronic obstructive pulmonary disease) (HCC)    Emphysema of lung (HCC)    Esophageal web    GERD (gastroesophageal reflux disease)    History of granulomatous disease    Hyperlipidemia    on meds   Hypertension    on meds   Iron deficiency anemia    Left leg swelling    Paroxysmal atrial fibrillation (HCC)    Right-sided low back pain with sciatica    Seasonal allergies    Sepsis (HCC) 08/17/2014   Past Surgical History:  Procedure Laterality Date   ABDOMINAL HYSTERECTOMY  2000   ANKLE SURGERY Left 1996   APPENDECTOMY  1978   BALLOON DILATION N/A 07/29/2021   Procedure: BALLOON DILATION;  Surgeon: Iva Boop, MD;  Location: [facility];  Service: Endoscopy;  Laterality: N/A;   BIOPSY  07/29/2021   Procedure: BIOPSY;  Surgeon: Iva Boop, MD;  Location: [facility];  Service: Endoscopy;;   CATARACT EXTRACTION Bilateral 2013   ESOPHAGOGASTRODUODENOSCOPY (EGD) WITH PROPOFOL N/A 07/29/2021   Procedure: ESOPHAGOGASTRODUODENOSCOPY (EGD) WITH PROPOFOL;  Surgeon: Iva Boop, MD;  [facility]: [facility];  Service: Endoscopy;  Laterality: N/A;   TOTAL KNEE ARTHROPLASTY Right 2008   TOTAL KNEE ARTHROPLASTY Left 2012   Social History   Social History Narrative   Patient is widowed.   She has 1 son and 1 daughter   1-2 alcoholic beverages a week 1 caffeinated beverage daily she is a former smoker not now      family history includes Alcohol abuse in her father; Asthma in her son; Heart disease in her father;  Hypertension in her daughter, father, and son.   Review of Systems As per HPI  Objective:   Physical Exam BP 134/70   Pulse 89   Ht 5' (1.524 m)   Wt 130 lb (59 kg)   BMI 25.39 kg/m  Kyphotic frail and elderly

## 2022-10-10 NOTE — Patient Instructions (Addendum)
We have placed a referral to hematology. They will contact you about setting up an appointment.   Your provider has requested that you go to the basement level for lab work before leaving today. Press "B" on the elevator. The lab is located at the first door on the left as you exit the elevator.  Due to recent changes in healthcare laws, you may see the results of your imaging and laboratory studies on MyChart before your provider has had a chance to review them.  We understand that in some cases there may be results that are confusing or concerning to you. Not all laboratory results come back in the same time frame and the provider may be waiting for multiple results in order to interpret others.  Please give [place] 48 hours in order for your provider to thoroughly review all the results before contacting the office for clarification of your results.     I appreciate the opportunity to care for you. Stan Head, MD, [facility]

## 2022-10-13 ENCOUNTER — Telehealth: Payer: Self-pay | Admitting: Internal Medicine

## 2022-10-13 NOTE — Telephone Encounter (Signed)
scheduled per referral, pt has been called and confirmed date and time. Pt is aware of location and to arrive early for check in   

## 2022-10-15 ENCOUNTER — Other Ambulatory Visit (INDEPENDENT_AMBULATORY_CARE_PROVIDER_SITE_OTHER): Payer: Medicare Other

## 2022-10-15 DIAGNOSIS — D696 Thrombocytopenia, unspecified: Secondary | ICD-10-CM

## 2022-10-15 DIAGNOSIS — D509 Iron deficiency anemia, unspecified: Secondary | ICD-10-CM

## 2022-10-15 LAB — FECAL OCCULT BLOOD, IMMUNOCHEMICAL: Fecal Occult Bld: POSITIVE — AB

## 2022-10-16 ENCOUNTER — Telehealth: Payer: Self-pay | Admitting: Internal Medicine

## 2022-10-16 DIAGNOSIS — R195 Other fecal abnormalities: Secondary | ICD-10-CM

## 2022-10-16 DIAGNOSIS — D508 Other iron deficiency anemias: Secondary | ICD-10-CM

## 2022-10-16 NOTE — Telephone Encounter (Signed)
Called w/ results - iFOBT + Have recommended a colonoscopy  Will need to be done at hospital - she is on O2 prn  We willarrange

## 2022-10-17 ENCOUNTER — Other Ambulatory Visit: Payer: Self-pay | Admitting: Internal Medicine

## 2022-10-17 DIAGNOSIS — D649 Anemia, unspecified: Secondary | ICD-10-CM

## 2022-10-18 ENCOUNTER — Inpatient Hospital Stay: Payer: Medicare Other

## 2022-10-18 ENCOUNTER — Inpatient Hospital Stay: Payer: Medicare Other | Attending: Internal Medicine | Admitting: Internal Medicine

## 2022-10-18 ENCOUNTER — Encounter: Payer: Self-pay | Admitting: Internal Medicine

## 2022-10-18 VITALS — BP 175/65 | HR 95 | Temp 97.6°F | Resp 18 | Ht 60.0 in | Wt 128.1 lb

## 2022-10-18 DIAGNOSIS — D649 Anemia, unspecified: Secondary | ICD-10-CM | POA: Insufficient documentation

## 2022-10-18 DIAGNOSIS — R7989 Other specified abnormal findings of blood chemistry: Secondary | ICD-10-CM | POA: Diagnosis not present

## 2022-10-18 DIAGNOSIS — N189 Chronic kidney disease, unspecified: Secondary | ICD-10-CM | POA: Insufficient documentation

## 2022-10-18 DIAGNOSIS — Z87891 Personal history of nicotine dependence: Secondary | ICD-10-CM | POA: Diagnosis not present

## 2022-10-18 DIAGNOSIS — D539 Nutritional anemia, unspecified: Secondary | ICD-10-CM

## 2022-10-18 DIAGNOSIS — D696 Thrombocytopenia, unspecified: Secondary | ICD-10-CM | POA: Diagnosis not present

## 2022-10-18 LAB — CBC WITH DIFFERENTIAL (CANCER CENTER ONLY)
Abs Immature Granulocytes: 0.1 10*3/uL — ABNORMAL HIGH (ref 0.00–0.07)
Basophils Absolute: 0.1 10*3/uL (ref 0.0–0.1)
Basophils Relative: 2 %
Eosinophils Absolute: 0 10*3/uL (ref 0.0–0.5)
Eosinophils Relative: 0 %
HCT: 33.3 % — ABNORMAL LOW (ref 36.0–46.0)
Hemoglobin: 10.8 g/dL — ABNORMAL LOW (ref 12.0–15.0)
Immature Granulocytes: 2 %
Lymphocytes Relative: 22 %
Lymphs Abs: 1.3 10*3/uL (ref 0.7–4.0)
MCH: 25.7 pg — ABNORMAL LOW (ref 26.0–34.0)
MCHC: 32.4 g/dL (ref 30.0–36.0)
MCV: 79.1 fL — ABNORMAL LOW (ref 80.0–100.0)
Monocytes Absolute: 0.7 10*3/uL (ref 0.1–1.0)
Monocytes Relative: 12 %
Neutro Abs: 3.8 10*3/uL (ref 1.7–7.7)
Neutrophils Relative %: 62 %
Platelet Count: 132 10*3/uL — ABNORMAL LOW (ref 150–400)
RBC: 4.21 MIL/uL (ref 3.87–5.11)
RDW: 21.5 % — ABNORMAL HIGH (ref 11.5–15.5)
WBC Count: 6 10*3/uL (ref 4.0–10.5)
nRBC: 0 % (ref 0.0–0.2)

## 2022-10-18 LAB — CMP (CANCER CENTER ONLY)
ALT: 12 U/L (ref 0–44)
AST: 17 U/L (ref 15–41)
Albumin: 3.8 g/dL (ref 3.5–5.0)
Alkaline Phosphatase: 82 U/L (ref 38–126)
Anion gap: 9 (ref 5–15)
BUN: 37 mg/dL — ABNORMAL HIGH (ref 8–23)
CO2: 23 mmol/L (ref 22–32)
Calcium: 9 mg/dL (ref 8.9–10.3)
Chloride: 107 mmol/L (ref 98–111)
Creatinine: 1.67 mg/dL — ABNORMAL HIGH (ref 0.44–1.00)
GFR, Estimated: 29 mL/min — ABNORMAL LOW (ref >60)
Glucose, Bld: 118 mg/dL — ABNORMAL HIGH (ref 70–99)
Potassium: 4.7 mmol/L (ref 3.5–5.1)
Sodium: 139 mmol/L (ref 135–145)
Total Bilirubin: 0.6 mg/dL (ref 0.3–1.2)
Total Protein: 6.5 g/dL (ref 6.5–8.1)

## 2022-10-18 LAB — FOLATE: Folate: >40 ng/mL (ref >5.9)

## 2022-10-18 LAB — FERRITIN: Ferritin: 51 ng/mL (ref 11–307)

## 2022-10-18 LAB — IRON AND IRON BINDING CAPACITY (CC-WL,HP ONLY)
Iron: 80 ug/dL (ref 28–170)
Saturation Ratios: 26 % (ref 10.4–31.8)
TIBC: 314 ug/dL (ref 250–450)
UIBC: 234 ug/dL (ref 148–442)

## 2022-10-18 LAB — VITAMIN B12: Vitamin B-12: 704 pg/mL (ref 180–914)

## 2022-10-18 LAB — LACTATE DEHYDROGENASE: LDH: 163 U/L (ref 98–192)

## 2022-10-18 LAB — TSH: TSH: 1.763 u[IU]/mL (ref 0.350–4.500)

## 2022-10-18 NOTE — Progress Notes (Signed)
[facility] [phone]   [phone]  CONSULT NOTE  REFERRING PHYSICIAN: Dr. Stan Head  REASON FOR CONSULTATION:  89 years old white female with persistent anemia and thrombocytopenia.  HPI [name] is a 89 y.o. female with past medical history significant for osteoarthritis, COPD, chronic kidney disease, hypertension, dyslipidemia, atrial fibrillation, seasonal allergy as well as basal cell carcinoma.  The patient was followed by Dr. Leone Payor for her anemia that has been going on since the fall 2022.  She has been on oral Feosol with orange juice and has been tolerating it fairly well with no concerning adverse effects.  She had upper endoscopy Dr. Leone Payor on 07/29/2021 that showed evidence of gastritis as well as esophageal stenosis.  The patient continues to have anemia despite her treatment with the iron tablet and improvement of her ferritin.  She had positive stool Hemoccult recently and she is expected to have a colonoscopy in the near future by Dr. Leone Payor.  She was referred to me today for evaluation and recommendation regarding her anemia and thrombocytopenia.  Last CBC on 10/06/2022 showed hemoglobin of 10.7 and hematocrit 33.5 with MCV of 78.1.  Her lowest hemoglobin was on February 15, 2021.  It was down to 6.2 with hematocrit of 19.9%.  Ferritin level on 10/06/2022 with 53. When seen today the patient is feeling fine with no concerning complaints except for the mild fatigue and shortness of breath with exertion.  She is sleeping most of the time.  She has no dizzy spells or feeling cold.  She denied having any chest pain, cough or hemoptysis.  She has no nausea, vomiting, diarrhea but has constipation from the iron tablets.  She denied having any significant weight loss or night sweats. Family history significant for father with alcoholism and heart disease.  Daughter had hypertension and mother had multiple medical condition. The patient is a  widow and has 2 children a son and daughter.  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  She had a history of smoking but quit 40 years ago.  She drinks alcohol occasionally and no history of drug abuse.  HPI  Past Medical History:  Diagnosis Date   Arthritis    Basal cell carcinoma    on  her face   Blood transfusion without reported diagnosis 2022   Cataract    bilateral sx   Cellulitis of left leg 07/28/2014   hospitalized for 4 days   Chronic kidney disease    COPD (chronic obstructive pulmonary disease) (HCC)    Emphysema of lung (HCC)    Esophageal web    GERD (gastroesophageal reflux disease)    History of granulomatous disease    Hyperlipidemia    on meds   Hypertension    on meds   Iron deficiency anemia    Left leg swelling    Paroxysmal atrial fibrillation (HCC)    Right-sided low back pain with sciatica    Seasonal allergies    Sepsis (HCC) 08/17/2014    Past Surgical History:  Procedure Laterality Date   ABDOMINAL HYSTERECTOMY  2000   ANKLE SURGERY Left 1996   APPENDECTOMY  1978   BALLOON DILATION N/A 07/29/2021   Procedure: BALLOON DILATION;  Surgeon: Iva Boop, MD;  Location: [facility];  Service: Endoscopy;  Laterality: N/A;   BIOPSY  07/29/2021   Procedure: BIOPSY;  Surgeon: Iva Boop, MD;  Location: [facility];  Service: Endoscopy;;   CATARACT EXTRACTION Bilateral 2013   ESOPHAGOGASTRODUODENOSCOPY (EGD) WITH PROPOFOL N/A 07/29/2021   Procedure: ESOPHAGOGASTRODUODENOSCOPY (EGD) WITH PROPOFOL;  Surgeon: Iva Boop, MD;  Location: [facility];  Service: Endoscopy;  Laterality: N/A;   TOTAL KNEE ARTHROPLASTY Right 2008   TOTAL KNEE ARTHROPLASTY Left 2012    Family History  Problem Relation Age of Onset   Alcohol abuse Father    Heart disease Father    Hypertension Father    Hypertension Daughter    Hypertension Son    Asthma Son    Colon polyps Neg Hx    Colon cancer Neg Hx     Esophageal cancer Neg Hx    Rectal cancer Neg Hx    Stomach cancer Neg Hx     Social History Social History   Tobacco Use   Smoking status: Former    Packs/day: 2.00    Years: 30.00    Additional pack years: 0.00    Total pack years: 60.00    Types: Cigarettes    Quit date: 04/29/1979    Years since quitting: 43.5   Smokeless tobacco: Never   Tobacco comments:    Former smoker 04/23/2021  Vaping Use   Vaping Use: Never used  Substance Use Topics   Alcohol use: Yes    Alcohol/week: 2.0 - 3.0 standard drinks of alcohol    Types: 2 - 3 Glasses of wine per week    Comment: 1 glass of wine 2-3 times a week 10/22/21   Drug use: No    Allergies  Allergen Reactions   Bacitracin     Makes wound worse    Neosporin [Neomycin-Bacitracin Zn-Polymyx]     Makes wound worse    Polysporin [Bacitracin-Polymyxin B]     Makes wound worse     Current Outpatient Medications  Medication Sig Dispense Refill   amLODipine (NORVASC) 5 MG tablet Take 5 mg by mouth at bedtime.   5   ANORO ELLIPTA 62.5-25 MCG/INH AEPB Inhale 1 puff into the lungs daily.     Ascorbic Acid (VITAMIN C PO) Take 1 tablet by mouth daily.     atorvastatin (LIPITOR) 20 MG tablet Take 20 mg by mouth at bedtime.      Calcium Citrate-Vitamin D (CALCIUM + D PO) Take 1 tablet by mouth 2 (two) times daily.     cetirizine (ZYRTEC) 10 MG tablet Take 10 mg by mouth daily as needed for allergies.     Cholecalciferol 25 MCG (1000 UT) capsule Take 1,000 Units by mouth daily.     famotidine (PEPCID) 20 MG tablet Take 20 mg by mouth daily.     ferrous sulfate 325 (65 FE) MG tablet Take 325 mg by mouth daily.     hydrALAZINE (APRESOLINE) 25 MG tablet Take 25 mg by mouth 2 (two) times daily.      losartan (COZAAR) 50 MG tablet Take 50 mg by mouth daily.     metoprolol succinate (TOPROL-XL) 25 MG 24 hr tablet Take 1 tablet (25 mg total) by mouth daily. 90 tablet 2   montelukast (SINGULAIR) 10 MG tablet Take 10 mg by mouth daily.      Multiple Vitamin (MULTIVITAMIN) tablet Take 1 tablet by mouth daily.     omeprazole (PRILOSEC) 40 MG capsule Take 40 mg by mouth daily.      OXYGEN Inhale into the lungs. As needed     pramipexole (MIRAPEX) 0.25 MG tablet Take 0.25 mg by mouth at bedtime.  Spacer/Aero-Holding Chambers (AEROCHAMBER MV) inhaler Use as instructed with Albuterol HFA 1 each 0   VENTOLIN HFA 108 (90 BASE) MCG/ACT inhaler Inhale 1-2 puffs into the lungs every 6 (six) hours as needed for wheezing.   3   VITAMIN D PO Take 1 tablet by mouth daily.     No current facility-administered medications for this visit.    Review of Systems  Constitutional: positive for fatigue Eyes: negative Ears, nose, mouth, throat, and face: negative Respiratory: positive for dyspnea on exertion Cardiovascular: negative Gastrointestinal: negative Genitourinary:negative Integument/breast: negative Hematologic/lymphatic: negative Musculoskeletal:positive for muscle weakness Neurological: negative Behavioral/Psych: negative Endocrine: negative Allergic/Immunologic: negative  Physical Exam  ZOX:WRUEA, healthy, no distress, well nourished, and well developed SKIN: skin color, texture, turgor are normal, no rashes or significant lesions HEAD: Normocephalic, No masses, lesions, tenderness or abnormalities EYES: normal, PERRLA, Conjunctiva are pink and non-injected EARS: External ears normal, Canals clear OROPHARYNX:no exudate, no erythema, and lips, buccal mucosa, and tongue normal  NECK: supple, no adenopathy, no JVD LYMPH:  no palpable lymphadenopathy, no hepatosplenomegaly BREAST:not examined LUNGS: clear to auscultation , and palpation HEART: regular rate & rhythm, no murmurs, and no gallops ABDOMEN:abdomen soft, non-tender, normal bowel sounds, and no masses or organomegaly BACK: Back symmetric, no curvature., No CVA tenderness EXTREMITIES:no joint deformities, effusion, or inflammation, no edema  NEURO: alert &  oriented x 3 with fluent speech, no focal motor/sensory deficits  PERFORMANCE STATUS: ECOG 1  LABORATORY DATA: Lab Results  Component Value Date   WBC 7.0 10/06/2022   HGB 10.7 (L) 10/06/2022   HCT 33.5 (L) 10/06/2022   MCV 78.1 10/06/2022   PLT 129.0 Repeated and verified X2. (L) 10/06/2022      Chemistry      Component Value Date/Time   NA 140 06/17/2022 1142   K 5.2 (H) 06/17/2022 1142   CL 108 06/17/2022 1142   CO2 22 06/17/2022 1142   BUN 34 (H) 06/17/2022 1142   CREATININE 1.65 (H) 06/17/2022 1142   CREATININE 1.17 (H) 09/21/2012 0953      Component Value Date/Time   CALCIUM 9.0 06/17/2022 1142   ALKPHOS 71 02/15/2021 1256   AST 20 02/15/2021 1256   ALT 14 02/15/2021 1256   BILITOT 0.5 02/15/2021 1256       RADIOGRAPHIC STUDIES: No results found.  ASSESSMENT: This is a very pleasant 89 years old white female with persistent mild anemia likely anemia of chronic disease secondary to renal insufficiency in addition to iron deficiency from history of gastrointestinal blood loss and recent Hemoccult positive stool.   PLAN: I had a lengthy discussion with the patient today about her current disease condition and treatment options. I ordered several studies today including repeat CBC that showed hemoglobin of 10.8 and hematocrit 33.3% with MCV of 79.1.  She has normal white blood count and mild thrombocytopenia. Last metabolic panel showed elevated serum creatinine of 1.67.  Iron studies unremarkable for iron deficiency with normal serum iron of 80 and iron saturation of 26%.  Ferritin level was 51.  He also has normal TSH, serum folate and vitamin B12 levels. I recommended for the patient to continue her current treatment with the oral iron tablet with orange juice but also give her the option of consideration for iron infusion before having the iron results.  She was interested in consideration of the iron infusion because she felt much better in the past.  I am not sure  if she will need it at this point because she  is tolerating the oral iron tablets fairly well and her iron study are within the normal range.  I will cancel this order for the iron infusion. For the history of gastrointestinal blood loss she is followed by Dr. Leone Payor and he may consider her for colonoscopy soon to rule out gastrointestinal bleeding. I will see her back for follow-up visit in around 3 months for evaluation and repeat blood work. Her thrombocytopenia is mild and likely secondary to her multiple other medical issues but pulmonary abnormality could not be completely excluded but with the patient his age and condition I would be very reluctant to proceed with any extensive investigation especially bone marrow biopsy and aspirate at this point. The patient is in agreement with the current plan. She was advised to call immediately if she has any other concerning symptoms in the interval.  The patient voices understanding of current disease status and treatment options and is in agreement with the current care plan.  All questions were answered. The patient knows to call the clinic with any problems, questions or concerns. We can certainly see the patient much sooner if necessary.  Thank you so much for allowing me to participate in the care of Judith Boone. I will continue to follow up the patient with you and assist in her care.  The total time spent in the appointment was 60 minutes.  Disclaimer: This note was dictated with voice recognition software. Similar sounding words can inadvertently be transcribed and may not be corrected upon review.   Lajuana Matte October 18, 2022, 10:53 AM

## 2022-10-22 LAB — PROTEIN ELECTROPHORESIS, SERUM, WITH REFLEX
A/G Ratio: 1.2 (ref 0.7–1.7)
Albumin ELP: 3.5 g/dL (ref 2.9–4.4)
Alpha-1-Globulin: 0.3 g/dL (ref 0.0–0.4)
Alpha-2-Globulin: 0.7 g/dL (ref 0.4–1.0)
Beta Globulin: 1.1 g/dL (ref 0.7–1.3)
Gamma Globulin: 0.9 g/dL (ref 0.4–1.8)
Globulin, Total: 3 g/dL (ref 2.2–3.9)
Total Protein ELP: 6.5 g/dL (ref 6.0–8.5)

## 2022-10-24 ENCOUNTER — Other Ambulatory Visit: Payer: Self-pay | Admitting: Internal Medicine

## 2022-10-24 DIAGNOSIS — R195 Other fecal abnormalities: Secondary | ICD-10-CM

## 2022-10-24 NOTE — Telephone Encounter (Signed)
I spoke with Judith Boone and told her Dr Leone Payor can see her here in the office on 12/11/2022 at 2:50pm and her colonoscopy is set up for [facility] on 12/30/2022 at 8:15AM, arrive at 6:45AM. We will go over the instructions at her office visit.  Case # [id number].

## 2022-10-24 NOTE — Telephone Encounter (Signed)
Patient returned your call, please advise. 

## 2022-10-24 NOTE — Telephone Encounter (Signed)
Need to schedule her for 9/3 at [facility] if that works for her (colonoscopy)  Let us set her up to see me mid August - 1130, 350, RN or banding spot can be used

## 2022-10-24 NOTE — Telephone Encounter (Signed)
I left her a detailed message to call me back to make sure she is in town to have her procedure on 12/30/2022 and then we can get it all set up and book her office appointment as well.

## 2022-10-27 DIAGNOSIS — L218 Other seborrheic dermatitis: Secondary | ICD-10-CM | POA: Diagnosis not present

## 2022-10-27 DIAGNOSIS — D485 Neoplasm of uncertain behavior of skin: Secondary | ICD-10-CM | POA: Diagnosis not present

## 2022-10-27 DIAGNOSIS — L570 Actinic keratosis: Secondary | ICD-10-CM | POA: Diagnosis not present

## 2022-10-27 DIAGNOSIS — L812 Freckles: Secondary | ICD-10-CM | POA: Diagnosis not present

## 2022-10-27 DIAGNOSIS — D4819 Other specified neoplasm of uncertain behavior of connective and other soft tissue: Secondary | ICD-10-CM | POA: Diagnosis not present

## 2022-10-27 DIAGNOSIS — H353211 Exudative age-related macular degeneration, right eye, with active choroidal neovascularization: Secondary | ICD-10-CM | POA: Diagnosis not present

## 2022-10-27 DIAGNOSIS — D692 Other nonthrombocytopenic purpura: Secondary | ICD-10-CM | POA: Diagnosis not present

## 2022-10-27 DIAGNOSIS — D2339 Other benign neoplasm of skin of other parts of face: Secondary | ICD-10-CM | POA: Diagnosis not present

## 2022-10-27 DIAGNOSIS — Z85828 Personal history of other malignant neoplasm of skin: Secondary | ICD-10-CM | POA: Diagnosis not present

## 2022-12-01 DIAGNOSIS — H35363 Drusen (degenerative) of macula, bilateral: Secondary | ICD-10-CM | POA: Diagnosis not present

## 2022-12-01 DIAGNOSIS — H353122 Nonexudative age-related macular degeneration, left eye, intermediate dry stage: Secondary | ICD-10-CM | POA: Diagnosis not present

## 2022-12-01 DIAGNOSIS — H353211 Exudative age-related macular degeneration, right eye, with active choroidal neovascularization: Secondary | ICD-10-CM | POA: Diagnosis not present

## 2022-12-01 DIAGNOSIS — H35453 Secondary pigmentary degeneration, bilateral: Secondary | ICD-10-CM | POA: Diagnosis not present

## 2022-12-01 DIAGNOSIS — Z961 Presence of intraocular lens: Secondary | ICD-10-CM | POA: Diagnosis not present

## 2022-12-06 NOTE — Progress Notes (Unsigned)
Cardiology Office Note:  .   Date:  12/09/2022  ID:  Judith Boone, DOB [birth year], MRN [id number] PCP: [name], MD  [facility] Providers Cardiologist:  Donato Schultz, MD    Patient Profile: .      PMH Atrial fibrillation Diagnosed 07/17/2018  COPD Hypertension Hyperlipidemia PVD Former tobacco use  Diagnosed with atrial fibrillation on 07/17/2018 after presenting to the emergency room with elevated heart rate secondary to Apple Watch diagnosis.  She was asymptomatic.  She was started on Eliquis for CHA2DS2-VASc score of 5 as well as metoprolol for rate control.  Zio patch December 2020 showed no atrial fibrillation but she did have brief episodes of atrial tachycardia and occasional PACs and PVCs.  Found to be profoundly anemic October 2022 and had upper EGD which showed gastritis. She received 2 units PRBCs April 2023.  She has been followed [facility] until 05/30/22 when she was referred to general cardiology and seen by Dr. Anne Fu.  At that time she remains anemic with hemoglobin 8.6.  She was feeling wiped out.  She had been maintaining sinus rhythm for some time with no notifications of atrial fibrillation on her Apple Watch.  Through shared decision making her Eliquis 2.5 mg was stopped due to ongoing anemia.  She was advised to consider resuming Eliquis if atrial fibrillation returns and also risk of stroke if A-fib returns.  She was advised to return in 6 months for follow-up.       History of Present Illness: .   Judith Boone is a very pleasant  89 y.o. female who is here today for 6 month follow-up. Reports increased shortness of breath recently that she feels has been exacerbated by allergies and her nose was constantly dripping. Feeling better today. Also feels SOB is secondary to COPD. Reports she feels like for her age, she is overall doing well. We discussed EKG findings that she has seen in MyChart. She denies chest pain, orthopnea, PND, edema, presyncope, or  syncope. She denies acute cardiac concerns today.   ROS: See HPI       Studies Reviewed: Marland Kitchen   EKG Interpretation Date/Time:  Monday December 08 2022 15:45:23 EDT Ventricular Rate:  88 PR Interval:  248 QRS Duration:  66 QT Interval:  370 QTC Calculation: 447 R Axis:   63  Text Interpretation: Sinus rhythm with 1st degree A-V block with Premature supraventricular complexes and with occasional Premature ventricular complexes Septal infarct (cited on or before 23-Apr-2021) When compared with ECG of 15-Apr-2022 13:57, Premature ventricular complexes are now Present Premature supraventricular complexes are now Present Confirmed by Eligha Bridegroom [phone]) on 12/08/2022 3:51:05 PM     Risk Assessment/Calculations:    CHA2DS2-VASc Score = 5   This indicates a 7.2% annual risk of stroke. The patient's score is based upon: CHF History: 0 HTN History: 1 Diabetes History: 0 Stroke History: 0 Vascular Disease History: 1 Age Score: 2 Gender Score: 1      emo      Physical Exam:   VS:  BP 136/68   Pulse 89   Ht 5' (1.524 m)   Wt 127 lb 12.8 oz (58 kg)   SpO2 92%   BMI 24.96 kg/m    Wt Readings from Last 3 Encounters:  12/08/22 127 lb 12.8 oz (58 kg)  10/18/22 128 lb 1.6 oz (58.1 kg)  10/10/22 130 lb (59 kg)    GEN: Well nourished, well developed in no acute distress NECK:  No JVD; No carotid bruits CARDIAC: RRR, no murmurs, rubs, gallops RESPIRATORY:  Clear to auscultation without rales, wheezing or rhonchi  ABDOMEN: Soft, non-tender, non-distended EXTREMITIES:  No edema; No deformity     ASSESSMENT AND PLAN: .    Atrial fibrillation: No evidence of return of a fib. She thinks only one episode 4-5 years ago. Anticoagulation was stopped due to ongoing anemia. Has upcoming colonoscopy. Advised her to notify us if she has concerns for return of a fib.    Anemia: Hemogloin 10.8 on 10/18/22, stable. Guiac positive for blood, is undergoing colonoscopy in a few weeks. Management per  hematology.   CKD: Stable renal function on labs completed 10/18/22.   Hypertension: BP elevated initially but improved on my recheck. Reports no concerns with elevated BP at recent office visits. Does not monitor consistently at home. No medication changes today.        Dispo: 6 months with Dr. Anne Fu  Signed, Eligha Bridegroom, NP-C

## 2022-12-08 ENCOUNTER — Ambulatory Visit: Payer: Medicare Other | Attending: Nurse Practitioner | Admitting: Nurse Practitioner

## 2022-12-08 ENCOUNTER — Encounter: Payer: Self-pay | Admitting: Nurse Practitioner

## 2022-12-08 VITALS — BP 136/68 | HR 89 | Ht 60.0 in | Wt 127.8 lb

## 2022-12-08 DIAGNOSIS — I480 Paroxysmal atrial fibrillation: Secondary | ICD-10-CM | POA: Diagnosis not present

## 2022-12-08 DIAGNOSIS — N1832 Chronic kidney disease, stage 3b: Secondary | ICD-10-CM | POA: Insufficient documentation

## 2022-12-08 DIAGNOSIS — I10 Essential (primary) hypertension: Secondary | ICD-10-CM | POA: Diagnosis not present

## 2022-12-08 DIAGNOSIS — D500 Iron deficiency anemia secondary to blood loss (chronic): Secondary | ICD-10-CM | POA: Diagnosis not present

## 2022-12-08 NOTE — Patient Instructions (Addendum)
Medication Instructions:   Your physician recommends that you continue on your current medications as directed. Please refer to the Current Medication list given to you today.   *If you need a refill on your cardiac medications before your next appointment, please call your pharmacy*   Lab Work:  None ordered.  If you have labs (blood work) drawn today and your tests are completely normal, you will receive your results only by: [place] (if you have MyChart) OR A paper copy in the mail If you have any lab test that is abnormal or we need to change your treatment, we will call you to review the results.   Testing/Procedures:  None ordered.   Follow-Up: At [facility], you and your health needs are our priority.  As part of our continuing mission to provide you with exceptional heart care, we have created designated Provider Care Teams.  These Care Teams include your primary Cardiologist (physician) and Advanced Practice Providers (APPs -  Physician Assistants and Nurse Practitioners) who all work together to provide you with the care you need, when you need it.  We recommend signing up for the patient portal called "MyChart".  Sign up information is provided on this After Visit Summary.  MyChart is used to connect with patients for Virtual Visits (Telemedicine).  Patients are able to view lab/test results, encounter notes, upcoming appointments, etc.  Non-urgent messages can be sent to your provider as well.   To learn more about what you can do with MyChart, go to [email or web address].    Your next appointment:   6 month(s)  Provider:   Candee Furbish, MD

## 2022-12-11 ENCOUNTER — Encounter: Payer: Self-pay | Admitting: Internal Medicine

## 2022-12-11 ENCOUNTER — Ambulatory Visit (INDEPENDENT_AMBULATORY_CARE_PROVIDER_SITE_OTHER): Payer: Medicare Other | Admitting: Internal Medicine

## 2022-12-11 VITALS — BP 130/70 | HR 72 | Ht 60.0 in | Wt 126.0 lb

## 2022-12-11 DIAGNOSIS — D638 Anemia in other chronic diseases classified elsewhere: Secondary | ICD-10-CM

## 2022-12-11 DIAGNOSIS — I480 Paroxysmal atrial fibrillation: Secondary | ICD-10-CM

## 2022-12-11 DIAGNOSIS — D508 Other iron deficiency anemias: Secondary | ICD-10-CM | POA: Diagnosis not present

## 2022-12-11 DIAGNOSIS — R195 Other fecal abnormalities: Secondary | ICD-10-CM | POA: Diagnosis not present

## 2022-12-11 MED ORDER — NA SULFATE-K SULFATE-MG SULF 17.5-3.13-1.6 GM/177ML PO SOLN: 1.0000 | Freq: Once | ORAL | 0 refills | Status: AC

## 2022-12-11 NOTE — Patient Instructions (Signed)
You have been scheduled for a colonoscopy. Please follow written instructions given to you at your visit today.   Please pick up your prep supplies at the pharmacy within the next 1-3 days.  If you use inhalers (even only as needed), please bring them with you on the day of your procedure.  DO NOT TAKE 7 DAYS PRIOR TO TEST- Trulicity (dulaglutide) Ozempic, Wegovy (semaglutide) Mounjaro (tirzepatide) Bydureon Bcise (exanatide extended release)  DO NOT TAKE 1 DAY PRIOR TO YOUR TEST Rybelsus (semaglutide) Adlyxin (lixisenatide) Victoza (liraglutide) Byetta (exanatide) ___________________________________________________________________________  I appreciate the opportunity to care for you. Carl Gessner, MD, FACG 

## 2022-12-11 NOTE — Progress Notes (Signed)
Judith Boone 89 y.o. 12/15/1931 [id number]  Assessment & Plan:   Encounter Diagnoses  Name Primary?   Heme + stool Yes   Other iron deficiency anemia    Anemia of chronic disease    Paroxysmal atrial fibrillation (HCC)    We will proceed with planned colonoscopy in early September at the hospital.  Prep instructions were given today.  She is off anticoagulation for A-fib.  Further recommendations regarding that pending results of colonoscopy.  CC: Judith Penna, MD   Subjective:   Chief Complaint: Follow-up of heme positive stool  HPI Judith Boone is an 89 year old white woman with iron deficiency anemia of unclear etiology.  She does have a large hiatal hernia but without clear Cameron lesions.  She has a chronic microcytosis and when she was seen in June I sent her to Dr. Shirline Boone of hematology and did an I FOBT which was positive.  We have decided to pursue a colonoscopy which is scheduled for early September.  She is not having any rectal bleeding or changes in bowel habits.  Dr. Arbutus Boone thought that she probably had a multifactorial issue mostly chronic disease anemia.  She was not responding to iron supplementation so I was wondering if there was some sort of other bone marrow process.  At her 10/18/2022 visit with him hemoglobin was 10.8 which was stable, MCV 79 and ferritin was 51.  Ferritin had been 6 a year ago.  She has a history of paroxysmal atrial fibrillation and because of anemia and iron deficiency her anticoagulation was stopped. Allergies  Allergen Reactions   Bacitracin     Makes wound worse    Neosporin [Neomycin-Bacitracin Zn-Polymyx]     Makes wound worse    Polysporin [Bacitracin-Polymyxin B]     Makes wound worse    Current Meds  Medication Sig   amLODipine (NORVASC) 5 MG tablet Take 5 mg by mouth at bedtime.    ANORO ELLIPTA 62.5-25 MCG/INH AEPB Inhale 1 puff into the lungs daily.   Ascorbic Acid (VITAMIN C PO) Take 1 tablet by mouth daily.    atorvastatin (LIPITOR) 20 MG tablet Take 20 mg by mouth at bedtime.    Calcium Citrate-Vitamin D (CALCIUM + D PO) Take 1 tablet by mouth 2 (two) times daily.   cetirizine (ZYRTEC) 10 MG tablet Take 10 mg by mouth daily as needed for allergies.   Cholecalciferol 25 MCG (1000 UT) capsule Take 1,000 Units by mouth daily.   famotidine (PEPCID) 20 MG tablet Take 20 mg by mouth daily.   ferrous sulfate 325 (65 FE) MG tablet Take 325 mg by mouth daily.   hydrALAZINE (APRESOLINE) 25 MG tablet Take 25 mg by mouth 2 (two) times daily.    losartan (COZAAR) 50 MG tablet Take 50 mg by mouth daily.   metoprolol succinate (TOPROL-XL) 25 MG 24 hr tablet Take 1 tablet (25 mg total) by mouth daily.   montelukast (SINGULAIR) 10 MG tablet Take 10 mg by mouth daily.   Multiple Vitamin (MULTIVITAMIN) tablet Take 1 tablet by mouth daily.   Na Sulfate-K Sulfate-Mg Sulf 17.5-3.13-1.6 GM/177ML SOLN Take 1 kit by mouth once for 1 dose.   omeprazole (PRILOSEC) 40 MG capsule Take 40 mg by mouth daily.    OXYGEN Inhale into the lungs. As needed   pramipexole (MIRAPEX) 0.25 MG tablet Take 0.25 mg by mouth at bedtime.   Spacer/Aero-Holding Chambers (AEROCHAMBER MV) inhaler Use as instructed with Albuterol HFA   VENTOLIN HFA 108 (90 BASE) MCG/ACT  inhaler Inhale 1-2 puffs into the lungs every 6 (six) hours as needed for wheezing.    Past Medical History:  Diagnosis Date   Arthritis    Basal cell carcinoma    on  her face   Blood transfusion without reported diagnosis 2022   Cataract    bilateral sx   Cellulitis of left leg 07/28/2014   hospitalized for 4 days   Chronic kidney disease    COPD (chronic obstructive pulmonary disease) (HCC)    Emphysema of lung (HCC)    Esophageal web    GERD (gastroesophageal reflux disease)    History of granulomatous disease    Hyperlipidemia    on meds   Hypertension    on meds   Iron deficiency anemia    Left leg swelling    Paroxysmal atrial fibrillation (HCC)     Right-sided low back pain with sciatica    Seasonal allergies    Sepsis (HCC) 08/17/2014   Past Surgical History:  Procedure Laterality Date   ABDOMINAL HYSTERECTOMY  2000   ANKLE SURGERY Left 1996   APPENDECTOMY  1978   BALLOON DILATION N/A 07/29/2021   Procedure: BALLOON DILATION;  Surgeon: Judith Boop, MD;  Location: [facility];  Service: Endoscopy;  Laterality: N/A;   BIOPSY  07/29/2021   Procedure: BIOPSY;  Surgeon: Judith Boop, MD;  Location: [facility];  Service: Endoscopy;;   CATARACT EXTRACTION Bilateral 2013   ESOPHAGOGASTRODUODENOSCOPY (EGD) WITH PROPOFOL N/A 07/29/2021   Procedure: ESOPHAGOGASTRODUODENOSCOPY (EGD) WITH PROPOFOL;  Surgeon: Judith Boop, MD;  Location: [facility];  Service: Endoscopy;  Laterality: N/A;   TOTAL KNEE ARTHROPLASTY Right 2008   TOTAL KNEE ARTHROPLASTY Left 2012   Social History   Social History Narrative   Patient is widowed.   She has 1 son and 1 daughter   1-2 alcoholic beverages a week 1 caffeinated beverage daily she is a former smoker not now      family history includes Alcohol abuse in her father; Asthma in her son; Heart disease in her father; Hypertension in her daughter, father, and son.   Review of Systems As per HPI uses a walker to ambulate  Objective:   Physical Exam @BP  130/70   Pulse 72   Ht 5' (1.524 m)   Wt 126 lb (57.2 kg)   BMI 24.61 kg/m @  General:  NAD elderly white woman Eyes:   anicteric Lungs:  clear significant kyphosis Heart::  S1S2 no rubs, murmurs or gallops Abdomen:  soft and nontender, BS+ Ext:   no edema, cyanosis or clubbing    Data Reviewed:  See HPI

## 2022-12-11 NOTE — H&P (View-Only) (Signed)
 Judith Boone 89 y.o. 12/15/1931 [id number]  Assessment & Plan:   Encounter Diagnoses  Name Primary?   Heme + stool Yes   Other iron deficiency anemia    Anemia of chronic disease    Paroxysmal atrial fibrillation (HCC)    We will proceed with planned colonoscopy in early September at the hospital.  Prep instructions were given today.  She is off anticoagulation for A-fib.  Further recommendations regarding that pending results of colonoscopy.  CC: Judith Penna, MD   Subjective:   Chief Complaint: Follow-up of heme positive stool  HPI Judith Boone is an 89 year old white woman with iron deficiency anemia of unclear etiology.  She does have a large hiatal hernia but without clear Cameron lesions.  She has a chronic microcytosis and when she was seen in June I sent her to Dr. Shirline Boone of hematology and did an I FOBT which was positive.  We have decided to pursue a colonoscopy which is scheduled for early September.  She is not having any rectal bleeding or changes in bowel habits.  Dr. Arbutus Boone thought that she probably had a multifactorial issue mostly chronic disease anemia.  She was not responding to iron supplementation so I was wondering if there was some sort of other bone marrow process.  At her 10/18/2022 visit with him hemoglobin was 10.8 which was stable, MCV 79 and ferritin was 51.  Ferritin had been 6 a year ago.  She has a history of paroxysmal atrial fibrillation and because of anemia and iron deficiency her anticoagulation was stopped. Allergies  Allergen Reactions   Bacitracin     Makes wound worse    Neosporin [Neomycin-Bacitracin Zn-Polymyx]     Makes wound worse    Polysporin [Bacitracin-Polymyxin B]     Makes wound worse    Current Meds  Medication Sig   amLODipine (NORVASC) 5 MG tablet Take 5 mg by mouth at bedtime.    ANORO ELLIPTA 62.5-25 MCG/INH AEPB Inhale 1 puff into the lungs daily.   Ascorbic Acid (VITAMIN C PO) Take 1 tablet by mouth daily.    atorvastatin (LIPITOR) 20 MG tablet Take 20 mg by mouth at bedtime.    Calcium Citrate-Vitamin D (CALCIUM + D PO) Take 1 tablet by mouth 2 (two) times daily.   cetirizine (ZYRTEC) 10 MG tablet Take 10 mg by mouth daily as needed for allergies.   Cholecalciferol 25 MCG (1000 UT) capsule Take 1,000 Units by mouth daily.   famotidine (PEPCID) 20 MG tablet Take 20 mg by mouth daily.   ferrous sulfate 325 (65 FE) MG tablet Take 325 mg by mouth daily.   hydrALAZINE (APRESOLINE) 25 MG tablet Take 25 mg by mouth 2 (two) times daily.    losartan (COZAAR) 50 MG tablet Take 50 mg by mouth daily.   metoprolol succinate (TOPROL-XL) 25 MG 24 hr tablet Take 1 tablet (25 mg total) by mouth daily.   montelukast (SINGULAIR) 10 MG tablet Take 10 mg by mouth daily.   Multiple Vitamin (MULTIVITAMIN) tablet Take 1 tablet by mouth daily.   Na Sulfate-K Sulfate-Mg Sulf 17.5-3.13-1.6 GM/177ML SOLN Take 1 kit by mouth once for 1 dose.   omeprazole (PRILOSEC) 40 MG capsule Take 40 mg by mouth daily.    OXYGEN Inhale into the lungs. As needed   pramipexole (MIRAPEX) 0.25 MG tablet Take 0.25 mg by mouth at bedtime.   Spacer/Aero-Holding Chambers (AEROCHAMBER MV) inhaler Use as instructed with Albuterol HFA   VENTOLIN HFA 108 (90 BASE) MCG/ACT  inhaler Inhale 1-2 puffs into the lungs every 6 (six) hours as needed for wheezing.    Past Medical History:  Diagnosis Date   Arthritis    Basal cell carcinoma    on  her face   Blood transfusion without reported diagnosis 2022   Cataract    bilateral sx   Cellulitis of left leg 07/28/2014   hospitalized for 4 days   Chronic kidney disease    COPD (chronic obstructive pulmonary disease) (HCC)    Emphysema of lung (HCC)    Esophageal web    GERD (gastroesophageal reflux disease)    History of granulomatous disease    Hyperlipidemia    on meds   Hypertension    on meds   Iron deficiency anemia    Left leg swelling    Paroxysmal atrial fibrillation (HCC)     Right-sided low back pain with sciatica    Seasonal allergies    Sepsis (HCC) 08/17/2014   Past Surgical History:  Procedure Laterality Date   ABDOMINAL HYSTERECTOMY  2000   ANKLE SURGERY Left 1996   APPENDECTOMY  1978   BALLOON DILATION N/A 07/29/2021   Procedure: BALLOON DILATION;  Surgeon: Iva Boop, MD;  Location: [facility];  Service: Endoscopy;  Laterality: N/A;   BIOPSY  07/29/2021   Procedure: BIOPSY;  Surgeon: Iva Boop, MD;  Location: [facility];  Service: Endoscopy;;   CATARACT EXTRACTION Bilateral 2013   ESOPHAGOGASTRODUODENOSCOPY (EGD) WITH PROPOFOL N/A 07/29/2021   Procedure: ESOPHAGOGASTRODUODENOSCOPY (EGD) WITH PROPOFOL;  Surgeon: Iva Boop, MD;  Location: [facility];  Service: Endoscopy;  Laterality: N/A;   TOTAL KNEE ARTHROPLASTY Right 2008   TOTAL KNEE ARTHROPLASTY Left 2012   Social History   Social History Narrative   Patient is widowed.   She has 1 son and 1 daughter   1-2 alcoholic beverages a week 1 caffeinated beverage daily she is a former smoker not now      family history includes Alcohol abuse in her father; Asthma in her son; Heart disease in her father; Hypertension in her daughter, father, and son.   Review of Systems As per HPI uses a walker to ambulate  Objective:   Physical Exam @BP  130/70   Pulse 72   Ht 5' (1.524 m)   Wt 126 lb (57.2 kg)   BMI 24.61 kg/m @  General:  NAD elderly white woman Eyes:   anicteric Lungs:  clear significant kyphosis Heart::  S1S2 no rubs, murmurs or gallops Abdomen:  soft and nontender, BS+ Ext:   no edema, cyanosis or clubbing    Data Reviewed:  See HPI

## 2022-12-16 DIAGNOSIS — H04123 Dry eye syndrome of bilateral lacrimal glands: Secondary | ICD-10-CM | POA: Diagnosis not present

## 2022-12-16 DIAGNOSIS — Z961 Presence of intraocular lens: Secondary | ICD-10-CM | POA: Diagnosis not present

## 2022-12-16 DIAGNOSIS — H52203 Unspecified astigmatism, bilateral: Secondary | ICD-10-CM | POA: Diagnosis not present

## 2022-12-16 DIAGNOSIS — H353211 Exudative age-related macular degeneration, right eye, with active choroidal neovascularization: Secondary | ICD-10-CM | POA: Diagnosis not present

## 2022-12-18 ENCOUNTER — Encounter (HOSPITAL_COMMUNITY): Payer: Self-pay | Admitting: Internal Medicine

## 2022-12-18 ENCOUNTER — Other Ambulatory Visit: Payer: Self-pay

## 2022-12-18 NOTE — Progress Notes (Signed)
PCP - Dr. Shela Commons medical Cardiologist - Dr. Anne Fu Feb 2024  sees every year. LOV Eligha Bridegroom , NP 12-08-22  PPM/ICD -  Device Orders -  Rep Notified -   Chest x-ray - 2022 EKG - 12-08-22 Stress Test -  ECHO - 2020 Cardiac Cath -   Sleep Study -  CPAP -   Fasting Blood Sugar -  Checks Blood Sugar _____ times a day  Blood Thinner Instructions: Aspirin Instructions:  ERAS Protcol -N/A PRE-SURGERY   Pt. States she has her bowel prep instructions   COVID vaccine -  Activity--Able to complete ADL's without SOB or Cp Anesthesia review: wears O2 2L as needed states she uses 1-2 x a month , COP one time episode of A-fib, HTN  Patient denies shortness of breath, fever, cough and chest pain at PAT appointment   All instructions explained to the patient, with a verbal understanding of the material. Patient agrees to go over the instructions while at home for a better understanding. Patient also instructed to self quarantine after being tested for COVID-19. The opportunity to ask questions was provided.

## 2022-12-22 DIAGNOSIS — H353211 Exudative age-related macular degeneration, right eye, with active choroidal neovascularization: Secondary | ICD-10-CM | POA: Diagnosis not present

## 2022-12-30 ENCOUNTER — Ambulatory Visit (HOSPITAL_BASED_OUTPATIENT_CLINIC_OR_DEPARTMENT_OTHER): Payer: Medicare Other | Admitting: Anesthesiology

## 2022-12-30 ENCOUNTER — Ambulatory Visit (HOSPITAL_COMMUNITY): Payer: Medicare Other | Admitting: Anesthesiology

## 2022-12-30 ENCOUNTER — Encounter (HOSPITAL_COMMUNITY)
Admission: RE | Disposition: A | Discharge status: Home / Self Care | Payer: Self-pay | Source: Home / Self Care | Attending: Internal Medicine

## 2022-12-30 ENCOUNTER — Other Ambulatory Visit: Payer: Self-pay

## 2022-12-30 ENCOUNTER — Ambulatory Visit (HOSPITAL_COMMUNITY)
Admission: RE | Admit: 2022-12-30 | Discharge: 2022-12-30 | Disposition: A | Discharge status: Home / Self Care | Payer: Medicare Other | Attending: Internal Medicine | Admitting: Internal Medicine

## 2022-12-30 ENCOUNTER — Encounter (HOSPITAL_COMMUNITY): Payer: Self-pay | Admitting: Internal Medicine

## 2022-12-30 DIAGNOSIS — I129 Hypertensive chronic kidney disease with stage 1 through stage 4 chronic kidney disease, or unspecified chronic kidney disease: Secondary | ICD-10-CM | POA: Insufficient documentation

## 2022-12-30 DIAGNOSIS — D508 Other iron deficiency anemias: Secondary | ICD-10-CM | POA: Diagnosis not present

## 2022-12-30 DIAGNOSIS — K5730 Diverticulosis of large intestine without perforation or abscess without bleeding: Secondary | ICD-10-CM | POA: Diagnosis not present

## 2022-12-30 DIAGNOSIS — Z79899 Other long term (current) drug therapy: Secondary | ICD-10-CM | POA: Diagnosis not present

## 2022-12-30 DIAGNOSIS — I480 Paroxysmal atrial fibrillation: Secondary | ICD-10-CM | POA: Diagnosis not present

## 2022-12-30 DIAGNOSIS — G4730 Sleep apnea, unspecified: Secondary | ICD-10-CM | POA: Diagnosis not present

## 2022-12-30 DIAGNOSIS — N189 Chronic kidney disease, unspecified: Secondary | ICD-10-CM | POA: Diagnosis not present

## 2022-12-30 DIAGNOSIS — J4489 Other specified chronic obstructive pulmonary disease: Secondary | ICD-10-CM | POA: Diagnosis not present

## 2022-12-30 DIAGNOSIS — N183 Chronic kidney disease, stage 3 unspecified: Secondary | ICD-10-CM | POA: Diagnosis not present

## 2022-12-30 DIAGNOSIS — Z87891 Personal history of nicotine dependence: Secondary | ICD-10-CM | POA: Insufficient documentation

## 2022-12-30 DIAGNOSIS — K648 Other hemorrhoids: Secondary | ICD-10-CM

## 2022-12-30 DIAGNOSIS — K449 Diaphragmatic hernia without obstruction or gangrene: Secondary | ICD-10-CM | POA: Insufficient documentation

## 2022-12-30 DIAGNOSIS — D631 Anemia in chronic kidney disease: Secondary | ICD-10-CM | POA: Diagnosis not present

## 2022-12-30 DIAGNOSIS — K219 Gastro-esophageal reflux disease without esophagitis: Secondary | ICD-10-CM | POA: Insufficient documentation

## 2022-12-30 DIAGNOSIS — K649 Unspecified hemorrhoids: Secondary | ICD-10-CM | POA: Diagnosis not present

## 2022-12-30 DIAGNOSIS — D509 Iron deficiency anemia, unspecified: Secondary | ICD-10-CM | POA: Insufficient documentation

## 2022-12-30 DIAGNOSIS — R195 Other fecal abnormalities: Secondary | ICD-10-CM | POA: Insufficient documentation

## 2022-12-30 DIAGNOSIS — Z1211 Encounter for screening for malignant neoplasm of colon: Secondary | ICD-10-CM | POA: Diagnosis not present

## 2022-12-30 HISTORY — DX: Cardiac arrhythmia, unspecified: I49.9

## 2022-12-30 HISTORY — PX: COLONOSCOPY WITH PROPOFOL: SHX5780

## 2022-12-30 HISTORY — DX: Pneumonia, unspecified organism: J18.9

## 2022-12-30 HISTORY — DX: Personal history of other diseases of the digestive system: Z87.19

## 2022-12-30 SURGERY — COLONOSCOPY WITH PROPOFOL
Anesthesia: Monitor Anesthesia Care

## 2022-12-30 MED ORDER — PROPOFOL 500 MG/50ML IV EMUL
INTRAVENOUS | Status: DC | PRN
  Administered 2022-12-30: 50 ug/kg/min via INTRAVENOUS

## 2022-12-30 MED ORDER — LIDOCAINE 2% (20 MG/ML) 5 ML SYRINGE
INTRAMUSCULAR | Status: DC | PRN
  Administered 2022-12-30: 100 mg via INTRAVENOUS

## 2022-12-30 MED ORDER — LACTATED RINGERS IV SOLN: INTRAVENOUS | Status: DC

## 2022-12-30 MED ORDER — PROPOFOL 1000 MG/100ML IV EMUL
INTRAVENOUS | Status: AC
  Filled 2022-12-30: qty 100

## 2022-12-30 MED ORDER — SODIUM CHLORIDE 0.9 % IV SOLN: INTRAVENOUS | Status: DC

## 2022-12-30 MED ORDER — PROPOFOL 10 MG/ML IV BOLUS
INTRAVENOUS | Status: DC | PRN
  Administered 2022-12-30: 20 mg via INTRAVENOUS
  Administered 2022-12-30: 10 mg via INTRAVENOUS
  Administered 2022-12-30 (×2): 20 mg via INTRAVENOUS
  Administered 2022-12-30: 10 mg via INTRAVENOUS
  Administered 2022-12-30: 20 mg via INTRAVENOUS

## 2022-12-30 SURGICAL SUPPLY — 22 items

## 2022-12-30 NOTE — Transfer of Care (Signed)
Immediate Anesthesia Transfer of Care Note  Patient: Judith Boone  Procedure(s) Performed: COLONOSCOPY WITH PROPOFOL  Patient Location: Endoscopy Unit  Anesthesia Type:MAC  Level of Consciousness: awake  Airway & Oxygen Therapy: Patient Spontanous Breathing and Patient connected to face mask oxygen  Post-op Assessment: Report given to RN and Post -op Vital signs reviewed and stable  Post vital signs: Reviewed and stable  Last Vitals:  Vitals Value Taken Time  BP    Temp    Pulse    Resp    SpO2      Last Pain:  Vitals:   12/30/22 0718  TempSrc: Temporal  PainSc: 0-No pain         Complications: No notable events documented.

## 2022-12-30 NOTE — Discharge Instructions (Signed)
There were no polyps or cancer seen. You do have diverticulosis - thickened muscle rings and pouches in the colon wall. Please read the handout about this condition. Hemorrhoids also seen and I suspect that these were the source of microscopic blood in stool.  No need for further testing from me. Please continue you current treatment and see me as needed.  I appreciate the opportunity to care for you. Iva Boop, MD, FACG  YOU HAD AN ENDOSCOPIC PROCEDURE TODAY: Refer to the procedure report and other information in the discharge instructions given to you for any specific questions about what was found during the examination. If this information does not answer your questions, please call Dr. Marvell Fuller office at [phone] to clarify.   YOU SHOULD EXPECT: Some feelings of bloating in the abdomen. Passage of more gas than usual. Walking can help get rid of the air that was put into your GI tract during the procedure and reduce the bloating. If you had a lower endoscopy (such as a colonoscopy or flexible sigmoidoscopy) you may notice spotting of blood in your stool or on the toilet paper. Some abdominal soreness may be present for a day or two, also.  DIET: Your first meal following the procedure should be a light meal and then it is ok to progress to your normal diet. A half-sandwich or bowl of soup is an example of a good first meal. Heavy or fried foods are harder to digest and may make you feel nauseous or bloated. Drink plenty of fluids but you should avoid alcoholic beverages for 24 hours.   ACTIVITY: Your care partner should take you home directly after the procedure. You should plan to take it easy, moving slowly for the rest of the day. You can resume normal activity the day after the procedure however YOU SHOULD NOT DRIVE, use power tools, machinery or perform tasks that involve climbing or major physical exertion for 24 hours (because of the sedation medicines used during the test).    SYMPTOMS TO REPORT IMMEDIATELY: A gastroenterologist can be reached at any hour. Please call [phone]  for any of the following symptoms:  Following lower endoscopy (colonoscopy, flexible sigmoidoscopy) Excessive amounts of blood in the stool  Significant tenderness, worsening of abdominal pains  Swelling of the abdomen that is new, acute  Fever of 100 or higher    FOLLOW UP:  If any biopsies were taken you will be contacted by phone or by letter within the next 1-3 weeks. Call [phone]  if you have not heard about the biopsies in 3 weeks.  Please also call with any specific questions about appointments or follow up tests.

## 2022-12-30 NOTE — Op Note (Signed)
[facility] Patient Name: Judith Boone Procedure Date: 12/30/2022 MRN: [id number] Attending MD: Iva Boop , MD, [id number] Date of Birth: 04-Feb-1932 CSN: [id number] Age: 89 Admit Type: Outpatient Procedure:                Colonoscopy Indications:              Positive fecal immunochemical test, Iron deficiency                            anemia Providers:                Iva Boop, MD, Fransisca Connors, Beryle Beams, Technician, Leroy Libman, CRNA Referring MD:             Iva Boop, MD Medicines:                Monitored Anesthesia Care Complications:            No immediate complications. Estimated Blood Loss:     Estimated blood loss: none. Procedure:                Pre-Anesthesia Assessment:                           - Prior to the procedure, a History and Physical                            was performed, and patient medications and                            allergies were reviewed. The patient's tolerance of                            previous anesthesia was also reviewed. The risks                            and benefits of the procedure and the sedation                            options and risks were discussed with the patient.                            All questions were answered, and informed consent                            was obtained. Prior Anticoagulants: The patient has                            taken no anticoagulant or antiplatelet agents. ASA                            Grade Assessment: III - A patient with severe  systemic disease. After reviewing the risks and                            benefits, the patient was deemed in satisfactory                            condition to undergo the procedure.                           After obtaining informed consent, the colonoscope                            was passed under direct vision. Throughout the                             procedure, the patient's blood pressure, pulse, and                            oxygen saturations were monitored continuously. The                            PCF-HQ190L ([id number]) Olympus colonoscope was                            introduced through the anus and advanced to the the                            cecum, identified by appendiceal orifice and                            ileocecal valve. The colonoscopy was somewhat                            difficult due to a tortuous colon. Successful                            completion of the procedure was aided by                            straightening and shortening the scope to obtain                            bowel loop reduction and using scope torsion. The                            patient tolerated the procedure well. The quality                            of the bowel preparation was excellent. The                            ileocecal valve, appendiceal orifice, and rectum  were photographed. Scope In: 9:44:32 AM Scope Out: 9:52:37 AM Scope Withdrawal Time: 0 hours 3 minutes 49 seconds  Total Procedure Duration: 0 hours 8 minutes 5 seconds  Findings:      The perianal and digital rectal examinations were normal.      Many small and large diverticula were found in the entire colon. There       was no evidence of diverticular bleeding.      Internal hemorrhoids were found.      The exam was otherwise without abnormality.retroflexion not possible due       to small rectal vault. Impression:               - Severe diverticulosis in the entire examined                            colon. There was no evidence of diverticular                            bleeding.                           - Internal hemorrhoids. Believe these were source                            of FIT + stool.                           - The examination was otherwise normal. Cause of                            anemia not entirely clear -  could be hiatal hernia                            ? occult cameron lesions.                           - No specimens collected. Moderate Sedation:      Not Applicable - Patient had care per Anesthesia. Recommendation:           - Patient has a contact number available for                            emergencies. The signs and symptoms of potential                            delayed complications were discussed with the                            patient. Return to normal activities tomorrow.                            Written discharge instructions were provided to the                            patient.                           -  Resume previous diet.                           - Continue present medications. No more GI testing.                            Stay on iron therapy and f/u PCP and/or hematology.                            See me prn.                           - No repeat colonoscopy due to age.                           - My office will cc: Dr. Link Snuffer Procedure Code(s):        --- Professional ---                           [id number], Colonoscopy, flexible; diagnostic, including                            collection of specimen(s) by brushing or washing,                            when performed (separate procedure) Diagnosis Code(s):        --- Professional ---                           K64.8, Other hemorrhoids                           R19.5, Other fecal abnormalities                           D50.9, Iron deficiency anemia, unspecified                           K57.30, Diverticulosis of large intestine without                            perforation or abscess without bleeding CPT copyright 2022 [employer]. All rights reserved. The codes documented in this report are preliminary and upon coder review may  be revised to meet current compliance requirements. Iva Boop, MD 12/30/2022 10:06:34 AM This report has been signed electronically. Number of Addenda:  0

## 2022-12-30 NOTE — Interval H&P Note (Signed)
History and Physical Interval Note:  12/30/2022 9:26 AM  Judith Boone  has presented today for surgery, with the diagnosis of Positive IFOBT.  The various methods of treatment have been discussed with the patient and family. After consideration of risks, benefits and other options for treatment, the patient has consented to  Procedure(s): COLONOSCOPY WITH PROPOFOL (N/A) as a surgical intervention.  The patient's history has been reviewed, patient examined, no change in status, stable for surgery.  I have reviewed the patient's chart and labs.  Questions were answered to the patient's satisfaction.     Stan Head

## 2022-12-30 NOTE — Anesthesia Postprocedure Evaluation (Signed)
Anesthesia Post Note  Patient: Judith Boone  Procedure(s) Performed: COLONOSCOPY WITH PROPOFOL     Patient location during evaluation: PACU Anesthesia Type: MAC Level of consciousness: awake and alert Pain management: pain level controlled Vital Signs Assessment: post-procedure vital signs reviewed and stable Respiratory status: spontaneous breathing, nonlabored ventilation and respiratory function stable Cardiovascular status: blood pressure returned to baseline and stable Postop Assessment: no apparent nausea or vomiting Anesthetic complications: no   No notable events documented.  Last Vitals:  Vitals:   12/30/22 1010 12/30/22 1024  BP: (!) 137/54 (!) 145/57  Pulse: 75 75  Resp: 19 (!) 25  Temp:    SpO2: 94% 94%    Last Pain:  Vitals:   12/30/22 1024  TempSrc:   PainSc: 0-No pain                 Lowella Curb

## 2022-12-30 NOTE — Anesthesia Preprocedure Evaluation (Signed)
Anesthesia Evaluation  Patient identified by MRN, date of birth, ID band Patient awake    Reviewed: Allergy & Precautions, NPO status , Patient's Chart, lab work & pertinent test results  History of Anesthesia Complications Negative for: history of anesthetic complications  Airway Mallampati: IV  TM Distance: >3 FB Neck ROM: Limited  Mouth opening: Limited Mouth Opening  Dental  (+) Teeth Intact, Dental Advisory Given   Pulmonary shortness of breath and Long-Term Oxygen Therapy, neg sleep apnea, COPD,  COPD inhaler, neg recent URI, former smoker   breath sounds clear to auscultation       Cardiovascular hypertension, Pt. on medications + Peripheral Vascular Disease  + dysrhythmias Atrial Fibrillation  Rhythm:Regular   1. The left ventricle has normal systolic function with an ejection  fraction of 60-65%. The cavity size was normal. Left ventricular diastolic  Doppler parameters are consistent with impaired relaxation.   2. The mitral valve is grossly normal.   3. The tricuspid valve is grossly normal.   4. The aortic valve was not well visualized. Aortic valve regurgitation  is trivial by color flow Doppler. No stenosis of the aortic valve.   5. Normal LV function; mild diastolic dysfunction; trace AI; mild TR;  mildly elevated pulmonary pressure   Neuro/Psych    GI/Hepatic Neg liver ROS,GERD  Medicated,,  Endo/Other    Renal/GU CRFRenal diseaseLab Results      Component                Value               Date                      CREATININE               1.51 (H)            02/16/2021           Lab Results      Component                Value               Date                      K                        4.7                 02/16/2021                Musculoskeletal   Abdominal   Peds  Hematology  (+) Blood dyscrasia, anemia Lab Results      Component                Value               Date                       WBC                      4.6                 02/16/2021                HGB  8.7 (L)             02/16/2021                HCT                      28.2 (L)            02/16/2021                MCV                      75.6 (L)            02/16/2021                PLT                      130 (L)             02/16/2021            eliquis for afib   Anesthesia Other Findings   Reproductive/Obstetrics                              Anesthesia Physical Anesthesia Plan  ASA: 3  Anesthesia Plan: MAC   Post-op Pain Management: Minimal or no pain anticipated   Induction: Intravenous  PONV Risk Score and Plan: 2 and Propofol infusion and Treatment may vary due to age or medical condition  Airway Management Planned: Nasal Cannula, Natural Airway and Simple Face Mask  Additional Equipment:   Intra-op Plan:   Post-operative Plan:   Informed Consent: I have reviewed the patients History and Physical, chart, labs and discussed the procedure including the risks, benefits and alternatives for the proposed anesthesia with the patient or authorized representative who has indicated his/her understanding and acceptance.     Dental advisory given  Plan Discussed with: CRNA  Anesthesia Plan Comments:          Anesthesia Quick Evaluation

## 2023-01-01 ENCOUNTER — Encounter (HOSPITAL_COMMUNITY): Payer: Self-pay | Admitting: Internal Medicine

## 2023-01-19 ENCOUNTER — Inpatient Hospital Stay (HOSPITAL_BASED_OUTPATIENT_CLINIC_OR_DEPARTMENT_OTHER): Payer: Medicare Other | Admitting: Internal Medicine

## 2023-01-19 ENCOUNTER — Other Ambulatory Visit: Payer: Self-pay

## 2023-01-19 ENCOUNTER — Inpatient Hospital Stay: Payer: Medicare Other | Attending: Internal Medicine

## 2023-01-19 VITALS — BP 131/56 | HR 80 | Temp 97.5°F | Resp 17 | Ht 60.0 in | Wt 124.4 lb

## 2023-01-19 DIAGNOSIS — K5900 Constipation, unspecified: Secondary | ICD-10-CM | POA: Insufficient documentation

## 2023-01-19 DIAGNOSIS — D649 Anemia, unspecified: Secondary | ICD-10-CM

## 2023-01-19 DIAGNOSIS — D509 Iron deficiency anemia, unspecified: Secondary | ICD-10-CM | POA: Insufficient documentation

## 2023-01-19 DIAGNOSIS — Z79899 Other long term (current) drug therapy: Secondary | ICD-10-CM | POA: Diagnosis not present

## 2023-01-19 DIAGNOSIS — D539 Nutritional anemia, unspecified: Secondary | ICD-10-CM

## 2023-01-19 LAB — CBC WITH DIFFERENTIAL (CANCER CENTER ONLY)
Abs Immature Granulocytes: 0.1 10*3/uL — ABNORMAL HIGH (ref 0.00–0.07)
Basophils Absolute: 0.1 10*3/uL (ref 0.0–0.1)
Basophils Relative: 2 %
Eosinophils Absolute: 0.1 10*3/uL (ref 0.0–0.5)
Eosinophils Relative: 2 %
HCT: 30.4 % — ABNORMAL LOW (ref 36.0–46.0)
Hemoglobin: 9.9 g/dL — ABNORMAL LOW (ref 12.0–15.0)
Immature Granulocytes: 2 %
Lymphocytes Relative: 22 %
Lymphs Abs: 1.2 10*3/uL (ref 0.7–4.0)
MCH: 24.6 pg — ABNORMAL LOW (ref 26.0–34.0)
MCHC: 32.6 g/dL (ref 30.0–36.0)
MCV: 75.4 fL — ABNORMAL LOW (ref 80.0–100.0)
Monocytes Absolute: 0.7 10*3/uL (ref 0.1–1.0)
Monocytes Relative: 14 %
Neutro Abs: 3.1 10*3/uL (ref 1.7–7.7)
Neutrophils Relative %: 58 %
Platelet Count: 135 10*3/uL — ABNORMAL LOW (ref 150–400)
RBC: 4.03 MIL/uL (ref 3.87–5.11)
RDW: 21.5 % — ABNORMAL HIGH (ref 11.5–15.5)
WBC Count: 5.4 10*3/uL (ref 4.0–10.5)
nRBC: 0 % (ref 0.0–0.2)

## 2023-01-19 LAB — FOLATE: Folate: 37.5 ng/mL (ref >5.9)

## 2023-01-19 LAB — IRON AND IRON BINDING CAPACITY (CC-WL,HP ONLY)
Iron: 78 ug/dL (ref 28–170)
Saturation Ratios: 27 % (ref 10.4–31.8)
TIBC: 291 ug/dL (ref 250–450)
UIBC: 213 ug/dL (ref 148–442)

## 2023-01-19 LAB — VITAMIN B12: Vitamin B-12: 483 pg/mL (ref 180–914)

## 2023-01-19 LAB — FERRITIN: Ferritin: 70 ng/mL (ref 11–307)

## 2023-01-19 NOTE — Progress Notes (Signed)
[facility] Telephone:([phone]) [phone]   Fax:([phone]  OFFICE PROGRESS NOTE  Judith Penna, MD [address] [place] [address]  DIAGNOSIS: Persistent mild anemia likely anemia of chronic disease secondary to renal insufficiency in addition to iron deficiency from history of gastrointestinal blood loss and recent Hemoccult positive stool.   PRIOR THERAPY:   CURRENT THERAPY: Over-the-counter ferrous sulfate 325 mg p.o. daily with orange juice.  INTERVAL HISTORY: Judith Boone 89 y.o. female returns to the clinic today for 58-month follow-up visit. Discussed the use of AI scribe software for clinical note transcription with the patient, who gave verbal consent to proceed.  History of Present Illness   Judith Boone, an 89 year old patient with a history of iron deficiency anemia, has been on a regimen of daily ferrous sulfate tablets for the past three months. The patient reports good tolerance of the medication, with no associated upset stomach. However, she has experienced constipation, with bowel movements occurring daily to every other day. Despite this, the patient has not been using any stool softeners.  The patient denies any recent episodes of fatigue, weakness, or dizziness. She has been experiencing a dry cough, which she attributes to a dry throat, but denies any associated chest pain, shortness of breath, or hemoptysis. The patient also denies any recent episodes of melena or hematochezia. She underwent a colonoscopy two weeks prior to the consultation  by Dr. Leone Payor and that showed severe diverticulosis in addition to Internal hemorrhoids. Believe these were source of FIT + stool.  Despite feeling well, the patient's recent blood work showed a decrease in hemoglobin from 10.8 to 9.9, and a low MCV of 75.4.       MEDICAL HISTORY: Past Medical History:  Diagnosis Date   Arthritis    Basal cell carcinoma    on  her face   Blood transfusion without  reported diagnosis 2022   Cataract    bilateral sx   Cellulitis of left leg 07/28/2014   hospitalized for 4 days   Chronic kidney disease    COPD (chronic obstructive pulmonary disease) (HCC)    Dysrhythmia    one episode of a-fib no other issues   Emphysema of lung (HCC)    Esophageal web    GERD (gastroesophageal reflux disease)    History of granulomatous disease    History of hiatal hernia    Hyperlipidemia    on meds   Hypertension    on meds   Iron deficiency anemia    Left leg swelling    Paroxysmal atrial fibrillation (HCC)    Pneumonia    2022   Right-sided low back pain with sciatica    Seasonal allergies    Sepsis (HCC) 08/17/2014    ALLERGIES:  is allergic to bacitracin, neosporin [neomycin-bacitracin zn-polymyx], and polysporin [bacitracin-polymyxin b].  MEDICATIONS:  Current Outpatient Medications  Medication Sig Dispense Refill   amLODipine (NORVASC) 5 MG tablet Take 5 mg by mouth at bedtime.   5   ANORO ELLIPTA 62.5-25 MCG/INH AEPB Inhale 1 puff into the lungs daily.     Ascorbic Acid (VITAMIN C PO) Take 1 tablet by mouth daily.     atorvastatin (LIPITOR) 20 MG tablet Take 20 mg by mouth at bedtime.      Calcium Citrate-Vitamin D (CALCIUM + D PO) Take 1 tablet by mouth 2 (two) times daily.     cetirizine (ZYRTEC) 10 MG tablet Take 10 mg by mouth daily as needed for  allergies.     Cholecalciferol 25 MCG (1000 UT) capsule Take 1,000 Units by mouth daily.     famotidine (PEPCID) 20 MG tablet Take 20 mg by mouth daily.     ferrous sulfate 325 (65 FE) MG tablet Take 325 mg by mouth daily.     hydrALAZINE (APRESOLINE) 25 MG tablet Take 25 mg by mouth 2 (two) times daily.      losartan (COZAAR) 50 MG tablet Take 50 mg by mouth daily.     metoprolol succinate (TOPROL-XL) 25 MG 24 hr tablet Take 1 tablet (25 mg total) by mouth daily. 90 tablet 2   montelukast (SINGULAIR) 10 MG tablet Take 10 mg by mouth daily.     Multiple Vitamin (MULTIVITAMIN) tablet Take 1  tablet by mouth daily.     omeprazole (PRILOSEC) 40 MG capsule Take 40 mg by mouth daily.      OXYGEN Inhale into the lungs. As needed     pramipexole (MIRAPEX) 0.25 MG tablet Take 0.25 mg by mouth at bedtime.     Spacer/Aero-Holding Chambers (AEROCHAMBER MV) inhaler Use as instructed with Albuterol HFA 1 each 0   VENTOLIN HFA 108 (90 BASE) MCG/ACT inhaler Inhale 1-2 puffs into the lungs every 6 (six) hours as needed for wheezing.   3   No current facility-administered medications for this visit.    SURGICAL HISTORY:  Past Surgical History:  Procedure Laterality Date   ABDOMINAL HYSTERECTOMY  2000   ANKLE SURGERY Left 1996   APPENDECTOMY  1978   BALLOON DILATION N/A 07/29/2021   Procedure: BALLOON DILATION;  Surgeon: Iva Boop, MD;  Location: [facility];  Service: Endoscopy;  Laterality: N/A;   BIOPSY  07/29/2021   Procedure: BIOPSY;  Surgeon: Iva Boop, MD;  Location: [facility];  Service: Endoscopy;;   CATARACT EXTRACTION Bilateral 2013   COLONOSCOPY WITH PROPOFOL N/A 12/30/2022   Procedure: COLONOSCOPY WITH PROPOFOL;  Surgeon: Iva Boop, MD;  Location: [facility];  Service: Gastroenterology;  Laterality: N/A;   ESOPHAGOGASTRODUODENOSCOPY (EGD) WITH PROPOFOL N/A 07/29/2021   Procedure: ESOPHAGOGASTRODUODENOSCOPY (EGD) WITH PROPOFOL;  Surgeon: Iva Boop, MD;  Location: [facility];  Service: Endoscopy;  Laterality: N/A;   TOTAL KNEE ARTHROPLASTY Right 2008   TOTAL KNEE ARTHROPLASTY Left 2012    REVIEW OF SYSTEMS:  A comprehensive review of systems was negative.   PHYSICAL EXAMINATION: General appearance: alert, cooperative, and no distress Head: Normocephalic, without obvious abnormality, atraumatic Neck: no adenopathy, no JVD, supple, symmetrical, trachea midline, and thyroid not enlarged, symmetric, no tenderness/mass/nodules Lymph nodes: Cervical, supraclavicular, and axillary nodes normal. Resp: clear to auscultation bilaterally Back: symmetric, no  curvature. ROM normal. No CVA tenderness. Cardio: regular rate and rhythm, S1, S2 normal, no murmur, click, rub or gallop GI: soft, non-tender; bowel sounds normal; no masses,  no organomegaly Extremities: extremities normal, atraumatic, no cyanosis or edema  ECOG PERFORMANCE STATUS: 1 - Symptomatic but completely ambulatory  Blood pressure (!) 131/56, pulse 80, temperature (!) 97.5 F (36.4 C), temperature source Oral, resp. rate 17, height 5' (1.524 m), weight 124 lb 6.4 oz (56.4 kg), SpO2 96%.  LABORATORY DATA: Lab Results  Component Value Date   WBC 6.0 10/18/2022   HGB 10.8 (L) 10/18/2022   HCT 33.3 (L) 10/18/2022   MCV 79.1 (L) 10/18/2022   PLT 132 (L) 10/18/2022      Chemistry      Component Value Date/Time   NA 139 10/18/2022 1042   K 4.7 10/18/2022 1042  CL 107 10/18/2022 1042   CO2 23 10/18/2022 1042   BUN 37 (H) 10/18/2022 1042   CREATININE 1.67 (H) 10/18/2022 1042   CREATININE 1.17 (H) 09/21/2012 0953      Component Value Date/Time   CALCIUM 9.0 10/18/2022 1042   ALKPHOS 82 10/18/2022 1042   AST 17 10/18/2022 1042   ALT 12 10/18/2022 1042   BILITOT 0.6 10/18/2022 1042       RADIOGRAPHIC STUDIES: No results found.  ASSESSMENT AND PLAN: This is a very pleasant 89 years old white female with:    Iron Deficiency Anemia Hemoglobin decreased from 10.8 to 9.9 despite daily iron supplementation. No GI bleeding identified on recent colonoscopy except for internal hemorrhoids. Constipation reported with iron supplementation. -Increase ferrous sulfate to twice daily with orange juice. -Check iron studies and ferritin. -Consider iron infusion if levels are severely low. -Follow-up in three months to reassess hemoglobin levels.  Chronic Cough Dry cough reported, no hemoptysis. No associated symptoms identified. -Continue to monitor symptoms.  Constipation Daily bowel movements reported, but some constipation present. -Consider use of stool softener if  constipation worsens.   The patient was advised to call immediately if she has any other concerning symptoms in the interval. The patient voices understanding of current disease status and treatment options and is in agreement with the current care plan.  All questions were answered. The patient knows to call the clinic with any problems, questions or concerns. We can certainly see the patient much sooner if necessary.  The total time spent in the appointment was 25 minutes.  Disclaimer: This note was dictated with voice recognition software. Similar sounding words can inadvertently be transcribed and may not be corrected upon review.

## 2023-02-03 DIAGNOSIS — M79672 Pain in left foot: Secondary | ICD-10-CM | POA: Diagnosis not present

## 2023-02-03 DIAGNOSIS — I10 Essential (primary) hypertension: Secondary | ICD-10-CM | POA: Diagnosis not present

## 2023-02-03 DIAGNOSIS — L03116 Cellulitis of left lower limb: Secondary | ICD-10-CM | POA: Diagnosis not present

## 2023-02-03 DIAGNOSIS — I872 Venous insufficiency (chronic) (peripheral): Secondary | ICD-10-CM | POA: Diagnosis not present

## 2023-02-03 DIAGNOSIS — L03115 Cellulitis of right lower limb: Secondary | ICD-10-CM | POA: Diagnosis not present

## 2023-02-03 DIAGNOSIS — Z1389 Encounter for screening for other disorder: Secondary | ICD-10-CM | POA: Diagnosis not present

## 2023-02-23 DIAGNOSIS — H353211 Exudative age-related macular degeneration, right eye, with active choroidal neovascularization: Secondary | ICD-10-CM | POA: Diagnosis not present

## 2023-02-24 DIAGNOSIS — Z23 Encounter for immunization: Secondary | ICD-10-CM | POA: Diagnosis not present

## 2023-03-10 DIAGNOSIS — E611 Iron deficiency: Secondary | ICD-10-CM | POA: Diagnosis not present

## 2023-03-10 DIAGNOSIS — M810 Age-related osteoporosis without current pathological fracture: Secondary | ICD-10-CM | POA: Diagnosis not present

## 2023-03-10 DIAGNOSIS — I129 Hypertensive chronic kidney disease with stage 1 through stage 4 chronic kidney disease, or unspecified chronic kidney disease: Secondary | ICD-10-CM | POA: Diagnosis not present

## 2023-03-10 DIAGNOSIS — D649 Anemia, unspecified: Secondary | ICD-10-CM | POA: Diagnosis not present

## 2023-03-10 DIAGNOSIS — N1832 Chronic kidney disease, stage 3b: Secondary | ICD-10-CM | POA: Diagnosis not present

## 2023-03-10 DIAGNOSIS — E785 Hyperlipidemia, unspecified: Secondary | ICD-10-CM | POA: Diagnosis not present

## 2023-03-16 DIAGNOSIS — I129 Hypertensive chronic kidney disease with stage 1 through stage 4 chronic kidney disease, or unspecified chronic kidney disease: Secondary | ICD-10-CM | POA: Diagnosis not present

## 2023-03-16 DIAGNOSIS — E785 Hyperlipidemia, unspecified: Secondary | ICD-10-CM | POA: Diagnosis not present

## 2023-03-16 DIAGNOSIS — N1832 Chronic kidney disease, stage 3b: Secondary | ICD-10-CM | POA: Diagnosis not present

## 2023-03-17 DIAGNOSIS — Z0000 Encounter for general adult medical examination without abnormal findings: Secondary | ICD-10-CM | POA: Diagnosis not present

## 2023-03-17 DIAGNOSIS — I4891 Unspecified atrial fibrillation: Secondary | ICD-10-CM | POA: Diagnosis not present

## 2023-03-17 DIAGNOSIS — I700 Atherosclerosis of aorta: Secondary | ICD-10-CM | POA: Diagnosis not present

## 2023-03-17 DIAGNOSIS — D6869 Other thrombophilia: Secondary | ICD-10-CM | POA: Diagnosis not present

## 2023-03-17 DIAGNOSIS — N1832 Chronic kidney disease, stage 3b: Secondary | ICD-10-CM | POA: Diagnosis not present

## 2023-03-17 DIAGNOSIS — Z9981 Dependence on supplemental oxygen: Secondary | ICD-10-CM | POA: Diagnosis not present

## 2023-03-17 DIAGNOSIS — E611 Iron deficiency: Secondary | ICD-10-CM | POA: Diagnosis not present

## 2023-03-17 DIAGNOSIS — J449 Chronic obstructive pulmonary disease, unspecified: Secondary | ICD-10-CM | POA: Diagnosis not present

## 2023-03-17 DIAGNOSIS — I10 Essential (primary) hypertension: Secondary | ICD-10-CM | POA: Diagnosis not present

## 2023-03-17 DIAGNOSIS — L03115 Cellulitis of right lower limb: Secondary | ICD-10-CM | POA: Diagnosis not present

## 2023-03-17 DIAGNOSIS — L03116 Cellulitis of left lower limb: Secondary | ICD-10-CM | POA: Diagnosis not present

## 2023-03-17 DIAGNOSIS — M25572 Pain in left ankle and joints of left foot: Secondary | ICD-10-CM | POA: Diagnosis not present

## 2023-03-17 DIAGNOSIS — J9611 Chronic respiratory failure with hypoxia: Secondary | ICD-10-CM | POA: Diagnosis not present

## 2023-04-15 DIAGNOSIS — J9611 Chronic respiratory failure with hypoxia: Secondary | ICD-10-CM | POA: Diagnosis not present

## 2023-04-15 DIAGNOSIS — N1832 Chronic kidney disease, stage 3b: Secondary | ICD-10-CM | POA: Diagnosis not present

## 2023-04-15 DIAGNOSIS — J449 Chronic obstructive pulmonary disease, unspecified: Secondary | ICD-10-CM | POA: Diagnosis not present

## 2023-04-15 DIAGNOSIS — R051 Acute cough: Secondary | ICD-10-CM | POA: Diagnosis not present

## 2023-04-15 DIAGNOSIS — J069 Acute upper respiratory infection, unspecified: Secondary | ICD-10-CM | POA: Diagnosis not present

## 2023-04-15 DIAGNOSIS — I4891 Unspecified atrial fibrillation: Secondary | ICD-10-CM | POA: Diagnosis not present

## 2023-04-15 DIAGNOSIS — Z9981 Dependence on supplemental oxygen: Secondary | ICD-10-CM | POA: Diagnosis not present

## 2023-04-23 ENCOUNTER — Inpatient Hospital Stay: Payer: Medicare Other | Admitting: Internal Medicine

## 2023-04-23 ENCOUNTER — Inpatient Hospital Stay: Payer: Medicare Other | Attending: Internal Medicine

## 2023-05-04 DIAGNOSIS — D692 Other nonthrombocytopenic purpura: Secondary | ICD-10-CM | POA: Diagnosis not present

## 2023-05-04 DIAGNOSIS — D1801 Hemangioma of skin and subcutaneous tissue: Secondary | ICD-10-CM | POA: Diagnosis not present

## 2023-05-04 DIAGNOSIS — Z85828 Personal history of other malignant neoplasm of skin: Secondary | ICD-10-CM | POA: Diagnosis not present

## 2023-05-05 DIAGNOSIS — H353211 Exudative age-related macular degeneration, right eye, with active choroidal neovascularization: Secondary | ICD-10-CM | POA: Diagnosis not present

## 2023-05-14 ENCOUNTER — Inpatient Hospital Stay: Payer: Medicare Other | Attending: Internal Medicine

## 2023-05-14 ENCOUNTER — Inpatient Hospital Stay: Payer: Medicare Other | Admitting: Internal Medicine

## 2023-05-14 VITALS — BP 141/59 | Temp 97.4°F | Resp 18 | Wt 119.7 lb

## 2023-05-14 DIAGNOSIS — D649 Anemia, unspecified: Secondary | ICD-10-CM

## 2023-05-14 DIAGNOSIS — D509 Iron deficiency anemia, unspecified: Secondary | ICD-10-CM | POA: Insufficient documentation

## 2023-05-14 DIAGNOSIS — N189 Chronic kidney disease, unspecified: Secondary | ICD-10-CM | POA: Diagnosis not present

## 2023-05-14 DIAGNOSIS — D631 Anemia in chronic kidney disease: Secondary | ICD-10-CM | POA: Insufficient documentation

## 2023-05-14 LAB — CBC WITH DIFFERENTIAL (CANCER CENTER ONLY)
Abs Immature Granulocytes: 0.11 10*3/uL — ABNORMAL HIGH (ref 0.00–0.07)
Basophils Absolute: 0.1 10*3/uL (ref 0.0–0.1)
Basophils Relative: 2 %
Eosinophils Absolute: 0.2 10*3/uL (ref 0.0–0.5)
Eosinophils Relative: 3 %
HCT: 30 % — ABNORMAL LOW (ref 36.0–46.0)
Hemoglobin: 9.7 g/dL — ABNORMAL LOW (ref 12.0–15.0)
Immature Granulocytes: 2 %
Lymphocytes Relative: 22 %
Lymphs Abs: 1.1 10*3/uL (ref 0.7–4.0)
MCH: 24.3 pg — ABNORMAL LOW (ref 26.0–34.0)
MCHC: 32.3 g/dL (ref 30.0–36.0)
MCV: 75.2 fL — ABNORMAL LOW (ref 80.0–100.0)
Monocytes Absolute: 0.5 10*3/uL (ref 0.1–1.0)
Monocytes Relative: 10 %
Neutro Abs: 3 10*3/uL (ref 1.7–7.7)
Neutrophils Relative %: 61 %
Platelet Count: 137 10*3/uL — ABNORMAL LOW (ref 150–400)
RBC: 3.99 MIL/uL (ref 3.87–5.11)
RDW: 21.9 % — ABNORMAL HIGH (ref 11.5–15.5)
WBC Count: 4.9 10*3/uL (ref 4.0–10.5)
nRBC: 0 % (ref 0.0–0.2)

## 2023-05-14 LAB — FERRITIN: Ferritin: 126 ng/mL (ref 11–307)

## 2023-05-14 LAB — IRON AND IRON BINDING CAPACITY (CC-WL,HP ONLY)
Iron: 98 ug/dL (ref 28–170)
Saturation Ratios: 42 % — ABNORMAL HIGH (ref 10.4–31.8)
TIBC: 235 ug/dL — ABNORMAL LOW (ref 250–450)
UIBC: 137 ug/dL — ABNORMAL LOW (ref 148–442)

## 2023-05-14 NOTE — Progress Notes (Signed)
[facility] [phone]) [phone]   Fax:(336) [phone]  OFFICE PROGRESS NOTE  [name], MD [address] [place] [address]  DIAGNOSIS: Persistent mild anemia likely anemia of chronic disease secondary to renal insufficiency in addition to iron deficiency from history of gastrointestinal blood loss and recent Hemoccult positive stool.   PRIOR THERAPY:   CURRENT THERAPY: Over-the-counter ferrous sulfate 325 mg p.o. daily with orange juice.  INTERVAL HISTORY: Judith Boone 89 y.o. female returns to the clinic today for 16-month follow-up visit.Discussed the use of AI scribe software for clinical note transcription with the patient, who gave verbal consent to proceed.  History of Present Illness   The patient, an 89 year old with a history of iron deficiency anemia and anemia of chronic disease secondary to kidney dysfunction, reports a generally stable condition with no new health issues since the last visit approximately four months ago. She has been adhering to the prescribed regimen of over-the-counter ferrous sulfate, taken with orange juice or vitamin C.  The patient experienced a period of illness in December, coinciding with a move to a retirement community. This transition was described as challenging and temporarily impacted her overall well-being. However, she reports feeling better at present.  Regarding her anemia, the patient denies experiencing persistent fatigue or weakness. She also reports no adverse effects from the iron tablets, such as gastrointestinal upset or constipation. There are no new symptoms of concern, specifically no nausea, vomiting, chest pain, or breathing issues.  Despite the recent illness and significant life change, the patient's hemoglobin levels have remained relatively stable, with only a slight decrease from 9.9 to 9.7.       MEDICAL HISTORY: Past Medical History:  Diagnosis Date   Arthritis    Basal cell  carcinoma    on  her face   Blood transfusion without reported diagnosis 2022   Cataract    bilateral sx   Cellulitis of left leg 07/28/2014   hospitalized for 4 days   Chronic kidney disease    COPD (chronic obstructive pulmonary disease) (HCC)    Dysrhythmia    one episode of a-fib no other issues   Emphysema of lung (HCC)    Esophageal web    GERD (gastroesophageal reflux disease)    History of granulomatous disease    History of hiatal hernia    Hyperlipidemia    on meds   Hypertension    on meds   Iron deficiency anemia    Left leg swelling    Paroxysmal atrial fibrillation (HCC)    Pneumonia    2022   Right-sided low back pain with sciatica    Seasonal allergies    Sepsis (HCC) 08/17/2014    ALLERGIES:  is allergic to bacitracin, neosporin [neomycin-bacitracin zn-polymyx], and polysporin [bacitracin-polymyxin b].  MEDICATIONS:  Current Outpatient Medications  Medication Sig Dispense Refill   amLODipine (NORVASC) 5 MG tablet Take 5 mg by mouth at bedtime.   5   ANORO ELLIPTA 62.5-25 MCG/INH AEPB Inhale 1 puff into the lungs daily.     Ascorbic Acid (VITAMIN C PO) Take 1 tablet by mouth daily.     atorvastatin (LIPITOR) 20 MG tablet Take 20 mg by mouth at bedtime.      Calcium Citrate-Vitamin D (CALCIUM + D PO) Take 1 tablet by mouth 2 (two) times daily.     cetirizine (ZYRTEC) 10 MG tablet Take 10 mg by mouth daily as needed for allergies.     Cholecalciferol  25 MCG (1000 UT) capsule Take 1,000 Units by mouth daily.     famotidine (PEPCID) 20 MG tablet Take 20 mg by mouth daily.     ferrous sulfate 325 (65 FE) MG tablet Take 325 mg by mouth daily.     hydrALAZINE (APRESOLINE) 25 MG tablet Take 25 mg by mouth 2 (two) times daily.      losartan (COZAAR) 50 MG tablet Take 50 mg by mouth daily.     metoprolol succinate (TOPROL-XL) 25 MG 24 hr tablet Take 1 tablet (25 mg total) by mouth daily. 90 tablet 2   montelukast (SINGULAIR) 10 MG tablet Take 10 mg by mouth daily.      Multiple Vitamin (MULTIVITAMIN) tablet Take 1 tablet by mouth daily.     omeprazole (PRILOSEC) 40 MG capsule Take 40 mg by mouth daily.      OXYGEN Inhale into the lungs. As needed     pramipexole (MIRAPEX) 0.25 MG tablet Take 0.25 mg by mouth at bedtime.     Spacer/Aero-Holding Chambers (AEROCHAMBER MV) inhaler Use as instructed with Albuterol HFA 1 each 0   VENTOLIN HFA 108 (90 BASE) MCG/ACT inhaler Inhale 1-2 puffs into the lungs every 6 (six) hours as needed for wheezing.   3   No current facility-administered medications for this visit.    SURGICAL HISTORY:  Past Surgical History:  Procedure Laterality Date   ABDOMINAL HYSTERECTOMY  2000   ANKLE SURGERY Left 1996   APPENDECTOMY  1978   BALLOON DILATION N/A 07/29/2021   Procedure: BALLOON DILATION;  Surgeon: Iva Boop, MD;  Location: [facility];  Service: Endoscopy;  Laterality: N/A;   BIOPSY  07/29/2021   Procedure: BIOPSY;  Surgeon: Iva Boop, MD;  Location: [facility];  Service: Endoscopy;;   CATARACT EXTRACTION Bilateral 2013   COLONOSCOPY WITH PROPOFOL N/A 12/30/2022   Procedure: COLONOSCOPY WITH PROPOFOL;  Surgeon: Iva Boop, MD;  Location: [facility];  Service: Gastroenterology;  Laterality: N/A;   ESOPHAGOGASTRODUODENOSCOPY (EGD) WITH PROPOFOL N/A 07/29/2021   Procedure: ESOPHAGOGASTRODUODENOSCOPY (EGD) WITH PROPOFOL;  Surgeon: Iva Boop, MD;  Location: [facility];  Service: Endoscopy;  Laterality: N/A;   TOTAL KNEE ARTHROPLASTY Right 2008   TOTAL KNEE ARTHROPLASTY Left 2012    REVIEW OF SYSTEMS:  A comprehensive review of systems was negative except for: Constitutional: positive for fatigue Musculoskeletal: positive for arthralgias   PHYSICAL EXAMINATION: General appearance: alert, cooperative, and no distress Head: Normocephalic, without obvious abnormality, atraumatic Neck: no adenopathy, no JVD, supple, symmetrical, trachea midline, and thyroid not enlarged, symmetric, no  tenderness/mass/nodules Lymph nodes: Cervical, supraclavicular, and axillary nodes normal. Resp: clear to auscultation bilaterally Back: symmetric, no curvature. ROM normal. No CVA tenderness. Cardio: regular rate and rhythm, S1, S2 normal, no murmur, click, rub or gallop GI: soft, non-tender; bowel sounds normal; no masses,  no organomegaly Extremities: extremities normal, atraumatic, no cyanosis or edema  ECOG PERFORMANCE STATUS: 1 - Symptomatic but completely ambulatory  Blood pressure (!) 141/59, temperature (!) 97.4 F (36.3 C), temperature source Temporal, resp. rate 18, weight 119 lb 11.2 oz (54.3 kg), SpO2 95%.  LABORATORY DATA: Lab Results  Component Value Date   WBC 4.9 05/14/2023   HGB 9.7 (L) 05/14/2023   HCT 30.0 (L) 05/14/2023   MCV 75.2 (L) 05/14/2023   PLT 137 (L) 05/14/2023      Chemistry      Component Value Date/Time   NA 139 10/18/2022 1042   K 4.7 10/18/2022 1042   CL  107 10/18/2022 1042   CO2 23 10/18/2022 1042   BUN 37 (H) 10/18/2022 1042   CREATININE 1.67 (H) 10/18/2022 1042   CREATININE 1.17 (H) 09/21/2012 0953      Component Value Date/Time   CALCIUM 9.0 10/18/2022 1042   ALKPHOS 82 10/18/2022 1042   AST 17 10/18/2022 1042   ALT 12 10/18/2022 1042   BILITOT 0.6 10/18/2022 1042       RADIOGRAPHIC STUDIES: No results found.  ASSESSMENT AND PLAN: This is a very pleasant 89 years old white female with Persistent mild anemia likely anemia of chronic disease secondary to renal insufficiency in addition to iron deficiency from history of gastrointestinal blood loss and recent Hemoccult positive stool.     Iron Deficiency Anemia Chronic iron deficiency anemia, well-managed. Hemoglobin is 9.7 g/dL, slightly decreased from 9.9 g/dL four months ago. No new symptoms such as fatigue, weakness, nausea, vomiting, chest pain, or dyspnea. Compliant with ferrous sulfate and vitamin C, no adverse effects reported. - Continue ferrous sulfate with vitamin C -  Follow-up in four months - Instruct to call if experiencing increased weakness, bleeding, or other concerning symptoms  Anemia of Chronic Disease Anemia of chronic disease secondary to chronic kidney disease. Hemoglobin levels remain stable. No new symptoms reported. Transition to retirement community has been challenging but manageable. - Continue current management - Follow-up in four months - Instruct to call if experiencing increased weakness, bleeding, or other concerning symptoms.   The patient was advised to call immediately if she has any other concerning symptoms in the interval. The patient voices understanding of current disease status and treatment options and is in agreement with the current care plan.  All questions were answered. The patient knows to call the clinic with any problems, questions or concerns. We can certainly see the patient much sooner if necessary.  The total time spent in the appointment was 25 minutes.  Disclaimer: This note was dictated with voice recognition software. Similar sounding words can inadvertently be transcribed and may not be corrected upon review.

## 2023-06-01 DIAGNOSIS — H35363 Drusen (degenerative) of macula, bilateral: Secondary | ICD-10-CM | POA: Diagnosis not present

## 2023-06-01 DIAGNOSIS — H353122 Nonexudative age-related macular degeneration, left eye, intermediate dry stage: Secondary | ICD-10-CM | POA: Diagnosis not present

## 2023-06-01 DIAGNOSIS — H353211 Exudative age-related macular degeneration, right eye, with active choroidal neovascularization: Secondary | ICD-10-CM | POA: Diagnosis not present

## 2023-06-08 ENCOUNTER — Encounter: Payer: Self-pay | Admitting: Cardiology

## 2023-06-08 ENCOUNTER — Ambulatory Visit: Payer: Medicare Other | Attending: Cardiology | Admitting: Cardiology

## 2023-06-08 VITALS — BP 154/58 | HR 68 | Ht 60.0 in | Wt 120.2 lb

## 2023-06-08 DIAGNOSIS — I480 Paroxysmal atrial fibrillation: Secondary | ICD-10-CM | POA: Insufficient documentation

## 2023-06-08 DIAGNOSIS — D500 Iron deficiency anemia secondary to blood loss (chronic): Secondary | ICD-10-CM | POA: Diagnosis not present

## 2023-06-08 DIAGNOSIS — N1832 Chronic kidney disease, stage 3b: Secondary | ICD-10-CM | POA: Diagnosis present

## 2023-06-08 NOTE — Progress Notes (Signed)
 Cardiology Office Note:  .   Date:  06/08/2023  ID:  Judith Boone, DOB [birth year], MRN [id number] PCP: Barnetta Liberty, MD  [facility] Providers Cardiologist:  Dorothye Gathers, MD     History of Present Illness: .   Judith Boone is a 89 y.o. female Discussed with the use of AI scribe   History of Present Illness   Judith Boone is an 89 year old female with COPD and paroxysmal atrial fibrillation who presents with shortness of breath for a six-month follow-up.  She has a history of COPD and paroxysmal atrial fibrillation, with the latter not having recurred since an episode four or five years ago. Anticoagulation therapy was discontinued due to anemia. No recent episodes of bleeding or unusual symptoms. Her heart has been feeling good, with no skipped beats or unusual sensations.  Her blood pressure was noted to be slightly elevated during the visit, but she is on multiple antihypertensive medications including Norvasc  (amlodipine ) 5 mg, losartan 50 mg, metoprolol  25 mg, and hydralazine  25 mg twice daily.  Recent lab results show a creatinine level of 1.67, indicating chronic kidney disease, and an LDL cholesterol level of 60, which is well-controlled with atorvastatin  20 mg. Her hemoglobin is 9.7, reflecting chronic anemia, but it is an improvement from previous levels.          Studies Reviewed: .        Results   LABS Creatinine: 1.67 (10/18/2022) LDL: 60 Hemoglobin: 9.7  DIAGNOSTIC EKG: First-degree atrioventricular block, premature ventricular contractions (PVCs) present     Risk Assessment/Calculations:           Physical Exam:   VS:  BP (!) 154/58   Pulse 68   Ht 5' (1.524 m)   Wt 120 lb 3.2 oz (54.5 kg)   SpO2 91%   BMI 23.47 kg/m    Wt Readings from Last 3 Encounters:  06/08/23 120 lb 3.2 oz (54.5 kg)  05/14/23 119 lb 11.2 oz (54.3 kg)  01/19/23 124 lb 6.4 oz (56.4 kg)    GEN: Well nourished, well developed in no acute distress NECK: No JVD; No  carotid bruits CARDIAC: RRR, no murmurs, no rubs, no gallops RESPIRATORY:  Clear to auscultation without rales, wheezing or rhonchi  ABDOMEN: Soft, non-tender, non-distended EXTREMITIES:  No edema; No deformity   ASSESSMENT AND PLAN: .    Assessment and Plan    Paroxysmal Atrial Fibrillation No recent episodes in the past four to five years. Anticoagulation was stopped due to anemia. Current EKG shows normal rhythm with first-degree heart block and occasional PVCs, which are not concerning. EKG labeled abnormal due to these findings, but she is not harmful. Patient comfortable with discharge from cardiology clinic. - Discharge from cardiology clinic - Monitor by primary care physician - Return to cardiology if atrial fibrillation recurs or other issues arise  Hypertension Blood pressure slightly elevated today. On Norvasc  (amlodipine ) 5 mg, losartan 50 mg, metoprolol  25 mg, and hydralazine  25 mg twice a day. - Continue current antihypertensive regimen - Monitor blood pressure regularly  Chronic Obstructive Pulmonary Disease (COPD) Reports shortness of breath, consistent with COPD. No acute exacerbations noted. - Continue current management and medications as prescribed by primary care physician  Chronic Kidney Disease Creatinine is 1.67, indicating chronic kidney disease. Monitored by primary care physician. - Continue monitoring kidney function with primary care physician  Anemia Hemoglobin is 9.7, indicating chronic anemia. Improvement from previous levels. - Continue monitoring hemoglobin levels with  primary care physician  Hyperlipidemia LDL cholesterol is 60, well-controlled with atorvastatin  20 mg. - Continue atorvastatin  20 mg  General Health Maintenance Graduating from cardiology clinic. Stable. Monitor for AFIB. Happy about this. Advised to have all medications managed and refilled by primary care physician to avoid discrepancies.              Signed, Dorothye Gathers, MD

## 2023-06-08 NOTE — Patient Instructions (Signed)
 Medication Instructions:  The current medical regimen is effective;  continue present plan and medications.  *If you need a refill on your cardiac medications before your next appointment, please call your pharmacy*  Follow-Up: At [facility], you and your health needs are our priority.  As part of our continuing mission to provide you with exceptional heart care, we have created designated Provider Care Teams.  These Care Teams include your primary Cardiologist (physician) and Advanced Practice Providers (APPs -  Physician Assistants and Nurse Practitioners) who all work together to provide you with the care you need, when you need it.  We recommend signing up for the patient portal called "MyChart".  Sign up information is provided on this After Visit Summary.  MyChart is used to connect with patients for Virtual Visits (Telemedicine).  Patients are able to view lab/test results, encounter notes, upcoming appointments, etc.  Non-urgent messages can be sent to your provider as well.   To learn more about what you can do with MyChart, go to [email or web address].    Your next appointment:   Follow up as needed with Dr Anne Fu

## 2023-06-20 DIAGNOSIS — R293 Abnormal posture: Secondary | ICD-10-CM | POA: Diagnosis not present

## 2023-06-20 DIAGNOSIS — M542 Cervicalgia: Secondary | ICD-10-CM | POA: Diagnosis not present

## 2023-06-24 DIAGNOSIS — R293 Abnormal posture: Secondary | ICD-10-CM | POA: Diagnosis not present

## 2023-06-24 DIAGNOSIS — M542 Cervicalgia: Secondary | ICD-10-CM | POA: Diagnosis not present

## 2023-06-25 DIAGNOSIS — R531 Weakness: Secondary | ICD-10-CM | POA: Diagnosis not present

## 2023-06-25 DIAGNOSIS — R278 Other lack of coordination: Secondary | ICD-10-CM | POA: Diagnosis not present

## 2023-06-26 DIAGNOSIS — M542 Cervicalgia: Secondary | ICD-10-CM | POA: Diagnosis not present

## 2023-06-26 DIAGNOSIS — R293 Abnormal posture: Secondary | ICD-10-CM | POA: Diagnosis not present

## 2023-06-29 DIAGNOSIS — R278 Other lack of coordination: Secondary | ICD-10-CM | POA: Diagnosis not present

## 2023-06-29 DIAGNOSIS — M542 Cervicalgia: Secondary | ICD-10-CM | POA: Diagnosis not present

## 2023-06-29 DIAGNOSIS — R293 Abnormal posture: Secondary | ICD-10-CM | POA: Diagnosis not present

## 2023-06-29 DIAGNOSIS — R531 Weakness: Secondary | ICD-10-CM | POA: Diagnosis not present

## 2023-07-03 DIAGNOSIS — R278 Other lack of coordination: Secondary | ICD-10-CM | POA: Diagnosis not present

## 2023-07-03 DIAGNOSIS — R293 Abnormal posture: Secondary | ICD-10-CM | POA: Diagnosis not present

## 2023-07-03 DIAGNOSIS — R531 Weakness: Secondary | ICD-10-CM | POA: Diagnosis not present

## 2023-07-03 DIAGNOSIS — M542 Cervicalgia: Secondary | ICD-10-CM | POA: Diagnosis not present

## 2023-07-04 ENCOUNTER — Emergency Department (HOSPITAL_BASED_OUTPATIENT_CLINIC_OR_DEPARTMENT_OTHER)

## 2023-07-04 ENCOUNTER — Other Ambulatory Visit: Payer: Self-pay

## 2023-07-04 ENCOUNTER — Emergency Department (HOSPITAL_BASED_OUTPATIENT_CLINIC_OR_DEPARTMENT_OTHER)
Admission: EM | Admit: 2023-07-04 | Discharge: 2023-07-04 | Disposition: A | Discharge status: Home / Self Care | Attending: Emergency Medicine | Admitting: Emergency Medicine

## 2023-07-04 ENCOUNTER — Encounter (HOSPITAL_BASED_OUTPATIENT_CLINIC_OR_DEPARTMENT_OTHER): Payer: Self-pay

## 2023-07-04 DIAGNOSIS — J439 Emphysema, unspecified: Secondary | ICD-10-CM | POA: Insufficient documentation

## 2023-07-04 DIAGNOSIS — R0602 Shortness of breath: Secondary | ICD-10-CM | POA: Diagnosis not present

## 2023-07-04 DIAGNOSIS — I129 Hypertensive chronic kidney disease with stage 1 through stage 4 chronic kidney disease, or unspecified chronic kidney disease: Secondary | ICD-10-CM | POA: Diagnosis not present

## 2023-07-04 DIAGNOSIS — J449 Chronic obstructive pulmonary disease, unspecified: Secondary | ICD-10-CM | POA: Diagnosis not present

## 2023-07-04 DIAGNOSIS — K449 Diaphragmatic hernia without obstruction or gangrene: Secondary | ICD-10-CM | POA: Diagnosis not present

## 2023-07-04 DIAGNOSIS — J101 Influenza due to other identified influenza virus with other respiratory manifestations: Secondary | ICD-10-CM | POA: Insufficient documentation

## 2023-07-04 DIAGNOSIS — Z79899 Other long term (current) drug therapy: Secondary | ICD-10-CM | POA: Insufficient documentation

## 2023-07-04 DIAGNOSIS — N189 Chronic kidney disease, unspecified: Secondary | ICD-10-CM | POA: Insufficient documentation

## 2023-07-04 DIAGNOSIS — R918 Other nonspecific abnormal finding of lung field: Secondary | ICD-10-CM | POA: Diagnosis not present

## 2023-07-04 LAB — BASIC METABOLIC PANEL
Anion gap: 6 (ref 5–15)
BUN: 32 mg/dL — ABNORMAL HIGH (ref 8–23)
CO2: 25 mmol/L (ref 22–32)
Calcium: 8.3 mg/dL — ABNORMAL LOW (ref 8.9–10.3)
Chloride: 106 mmol/L (ref 98–111)
Creatinine, Ser: 1.38 mg/dL — ABNORMAL HIGH (ref 0.44–1.00)
GFR, Estimated: 37 mL/min — ABNORMAL LOW (ref >60)
Glucose, Bld: 108 mg/dL — ABNORMAL HIGH (ref 70–99)
Potassium: 4.7 mmol/L (ref 3.5–5.1)
Sodium: 137 mmol/L (ref 135–145)

## 2023-07-04 LAB — CBC
HCT: 28 % — ABNORMAL LOW (ref 36.0–46.0)
Hemoglobin: 8.8 g/dL — ABNORMAL LOW (ref 12.0–15.0)
MCH: 24.9 pg — ABNORMAL LOW (ref 26.0–34.0)
MCHC: 31.4 g/dL (ref 30.0–36.0)
MCV: 79.1 fL — ABNORMAL LOW (ref 80.0–100.0)
Platelets: 103 10*3/uL — ABNORMAL LOW (ref 150–400)
RBC: 3.54 MIL/uL — ABNORMAL LOW (ref 3.87–5.11)
RDW: 22.9 % — ABNORMAL HIGH (ref 11.5–15.5)
WBC: 5.8 10*3/uL (ref 4.0–10.5)
nRBC: 0 % (ref 0.0–0.2)

## 2023-07-04 LAB — RESP PANEL BY RT-PCR (RSV, FLU A&B, COVID)  RVPGX2
Influenza A by PCR: POSITIVE — AB
Influenza B by PCR: NEGATIVE
Resp Syncytial Virus by PCR: NEGATIVE
SARS Coronavirus 2 by RT PCR: NEGATIVE

## 2023-07-04 LAB — BRAIN NATRIURETIC PEPTIDE: B Natriuretic Peptide: 240.9 pg/mL — ABNORMAL HIGH (ref 0.0–100.0)

## 2023-07-04 MED ORDER — GUAIFENESIN ER 1200 MG PO TB12: 1.0000 | ORAL_TABLET | Freq: Two times a day (BID) | ORAL | 0 refills | Status: DC

## 2023-07-04 MED ORDER — BENZONATATE 100 MG PO CAPS: 100.0000 mg | ORAL_CAPSULE | Freq: Three times a day (TID) | ORAL | 0 refills | Status: DC

## 2023-07-04 MED ORDER — ALBUTEROL SULFATE HFA 108 (90 BASE) MCG/ACT IN AERS: 2.0000 | INHALATION_SPRAY | RESPIRATORY_TRACT | Status: DC | PRN

## 2023-07-04 MED ORDER — IPRATROPIUM-ALBUTEROL 0.5-2.5 (3) MG/3ML IN SOLN
3.0000 mL | Freq: Once | RESPIRATORY_TRACT | Status: AC
  Administered 2023-07-04: 3 mL via RESPIRATORY_TRACT
  Filled 2023-07-04: qty 3

## 2023-07-04 NOTE — Progress Notes (Signed)
 RT Note: Patient had a room air saturation 86% upon assessing. She was placed on 2lpm [place].  She does wear 3lpm at bedtime and PRN

## 2023-07-04 NOTE — ED Notes (Signed)
 Patient transported to X-ray

## 2023-07-04 NOTE — ED Notes (Signed)
 RT Note:  Patient was placed on room air to check her oxygen saturation. Her oxygen saturation decreased to 86%. She was placed back on 2lpm [place] to maintain an oxygen saturation at 93%. Patient wears oxygen at night while sleeping only but never during the day while awake. Her son has went to her home to pick up the portable oxygen concentrator to transport patient home when she is discharged.

## 2023-07-04 NOTE — ED Provider Notes (Signed)
 [place] [facility] Provider Note   CSN: [id number] Arrival date & time: 07/04/23  1023     History  Chief Complaint  Patient presents with   Shortness of Breath    Judith Boone is a 89 y.o. female.   Shortness of Breath    Patient has a history of acid reflux hypertension hyperlipidemia COPD chronic kidney disease paroxysmal atrial fibrillation, emphysema sepsis.  Patient states she is on oxygen at home although she mostly uses it at night.  She presents to the ED with complaints of cough and some shortness of breath.  She started having symptoms on Tuesday.  Last night she was up most of the night coughing so she came to the ED.  She has not had any fevers.  No chest pain.  No leg swelling  Home Medications Prior to Admission medications   Medication Sig Start Date End Date Taking? Authorizing Provider  benzonatate (TESSALON) 100 MG capsule Take 1 capsule (100 mg total) by mouth every 8 (eight) hours. 07/04/23  Yes Linwood Dibbles, MD  Guaifenesin 1200 MG TB12 Take 1 tablet (1,200 mg total) by mouth 2 (two) times daily at 10 AM and 5 PM. 07/04/23  Yes Linwood Dibbles, MD  amLODipine (NORVASC) 5 MG tablet Take 5 mg by mouth at bedtime.  04/13/15   [provider]  ANORO ELLIPTA 62.5-25 MCG/INH AEPB Inhale 1 puff into the lungs daily. 07/04/18   [provider]  Ascorbic Acid (VITAMIN C PO) Take 1 tablet by mouth daily.    [provider]  atorvastatin (LIPITOR) 20 MG tablet Take 20 mg by mouth at bedtime.     [provider]  Calcium Citrate-Vitamin D (CALCIUM + D PO) Take 1 tablet by mouth 2 (two) times daily.    [provider]  cetirizine (ZYRTEC) 10 MG tablet Take 10 mg by mouth daily as needed for allergies.    [provider]  Cholecalciferol 25 MCG (1000 UT) capsule Take 1,000 Units by mouth daily. 04/28/17   [provider]  ferrous sulfate 325 (65 FE) MG tablet Take 325 mg by mouth daily.     [provider]  hydrALAZINE (APRESOLINE) 25 MG tablet Take 25 mg by mouth 2 (two) times daily.     [provider]  losartan (COZAAR) 50 MG tablet Take 50 mg by mouth daily. 08/21/20   [provider]  metoprolol succinate (TOPROL-XL) 25 MG 24 hr tablet Take 1 tablet (25 mg total) by mouth daily. 07/23/18   Fenton, Clint R, PA  montelukast (SINGULAIR) 10 MG tablet Take 10 mg by mouth daily. 08/02/19   [provider]  Multiple Vitamin (MULTIVITAMIN) tablet Take 1 tablet by mouth daily.    [provider]  omeprazole (PRILOSEC) 40 MG capsule Take 40 mg by mouth daily.  07/22/18   [provider]  OXYGEN Inhale into the lungs. As needed    [provider]  pramipexole (MIRAPEX) 0.25 MG tablet Take 0.25 mg by mouth at bedtime. 02/04/21   [provider]  Spacer/Aero-Holding Chambers (AEROCHAMBER MV) inhaler Use as instructed with Albuterol HFA 10/07/19   Parrett, Tammy S, NP  VENTOLIN HFA 108 (90 BASE) MCG/ACT inhaler Inhale 1-2 puffs into the lungs every 6 (six) hours as needed for wheezing.  03/03/15   [provider]      Allergies    Bacitracin, Neosporin [neomycin-bacitracin zn-polymyx], and Polysporin [bacitracin-polymyxin b]    Review of  Systems   Review of Systems  Respiratory:  Positive for shortness of breath.     Physical Exam Updated Vital Signs BP (!) 153/103   Pulse 72   Temp 97.7 F (36.5 C) (Oral)   Resp 19   Ht 1.524 m (5')   Wt 54.4 kg   SpO2 94%   BMI 23.44 kg/m  Physical Exam Vitals and nursing note reviewed.  Constitutional:      General: She is not in acute distress.    Appearance: She is well-developed.  HENT:     Head: Normocephalic and atraumatic.     Right Ear: External ear normal.     Left Ear: External ear normal.  Eyes:     General: No scleral icterus.       Right eye: No discharge.        Left eye: No discharge.     Conjunctiva/sclera: Conjunctivae normal.  Neck:      Trachea: No tracheal deviation.  Cardiovascular:     Rate and Rhythm: Normal rate and regular rhythm.  Pulmonary:     Effort: Pulmonary effort is normal. No respiratory distress.     Breath sounds: No stridor. Rhonchi present. No wheezing or rales.  Abdominal:     General: Bowel sounds are normal. There is no distension.     Palpations: Abdomen is soft.     Tenderness: There is no abdominal tenderness. There is no guarding or rebound.  Musculoskeletal:        General: No tenderness or deformity.     Cervical back: Neck supple.  Skin:    General: Skin is warm and dry.     Findings: No rash.  Neurological:     General: No focal deficit present.     Mental Status: She is alert.     Cranial Nerves: No cranial nerve deficit, dysarthria or facial asymmetry.     Sensory: No sensory deficit.     Motor: No abnormal muscle tone or seizure activity.     Coordination: Coordination normal.  Psychiatric:        Mood and Affect: Mood normal.     ED Results / Procedures / Treatments   Labs (all labs ordered are listed, but only abnormal results are displayed) Labs Reviewed  RESP PANEL BY RT-PCR (RSV, FLU A&B, COVID)  RVPGX2 - Abnormal; Notable for the following components:      Result Value   Influenza A by PCR POSITIVE (*)    All other components within normal limits  CBC - Abnormal; Notable for the following components:   RBC 3.54 (*)    Hemoglobin 8.8 (*)    HCT 28.0 (*)    MCV 79.1 (*)    MCH 24.9 (*)    RDW 22.9 (*)    Platelets 103 (*)    All other components within normal limits  BASIC METABOLIC PANEL - Abnormal; Notable for the following components:   Glucose, Bld 108 (*)    BUN 32 (*)    Creatinine, Ser 1.38 (*)    Calcium 8.3 (*)    GFR, Estimated 37 (*)    All other components within normal limits  BRAIN NATRIURETIC PEPTIDE - Abnormal; Notable for the following components:   B Natriuretic Peptide 240.9 (*)    All other components within normal limits     EKG None  Radiology DG Chest 2 View Result Date: 07/04/2023 CLINICAL DATA:  Shortness of breath. EXAM: CHEST - 2 VIEW COMPARISON:  02/15/2021 FINDINGS:  Rightward patient rotation. The cardio pericardial silhouette is enlarged. Hyperexpansion suggests emphysema. Interstitial markings are diffusely coarsened with chronic features. Calcified granulomas in the left mid lung again noted with diffuse micro nodularity overlying both lungs as before and compatible with the innumerable granuloma seen on chest CT 09/02/2019. Small to moderate hiatal hernia noted. Bones are diffusely demineralized. Telemetry leads overlie the chest. IMPRESSION: Hyperexpansion consistent with emphysema. Innumerable calcified granulomata in both lungs, as before. Small to moderate hiatal hernia. Electronically Signed   By: Kennith Center M.D.   On: 07/04/2023 12:19    Procedures Procedures    Medications Ordered in ED Medications  ipratropium-albuterol (DUONEB) 0.5-2.5 (3) MG/3ML nebulizer solution 3 mL (3 mLs Nebulization Given 07/04/23 1124)    ED Course/ Medical Decision Making/ A&P Clinical Course as of 07/04/23 1302  Sat Jul 04, 2023  1147 CBC(!) Anemia noted.  Hemoglobin slightly decreased compared to previous. [JK]  1147 Resp panel by RT-PCR (RSV, Flu A&B, Covid) Anterior Nasal Swab(!) Influenza A is positive [JK]  1228 Basic metabolic panel(!) Creatinine stable compared to previous values [JK]  1228 Chest x-ray shows findings consistent with emphysema granulomatous also noted unchanged.  No evidence of pneumonia [JK]    Clinical Course User Index [JK] Linwood Dibbles, MD                                 Medical Decision Making Problems Addressed: Influenza A: acute illness or injury that poses a threat to life or bodily functions Pulmonary emphysema, unspecified emphysema type [facility]): chronic illness or injury  Amount and/or Complexity of Data Reviewed Labs: ordered. Decision-making details documented in  ED Course. Radiology: ordered and independent interpretation performed.  Risk OTC drugs. Prescription drug management.   Patient presented to the ED for evaluation of cough shortness of breath.  Her symptoms started on Tuesday.  Patient states last night she was having more difficulty sleeping because of her cough.  Patient is on home oxygen although usually uses it at night.  Patient was started on her supplemental 2 L of nasal cannula oxygen here and does not have any hypoxia.  Her x-ray does not show any evidence of pneumonia.  No findings to suggest pulmonary edema.  Patient's influenza is positive.  Patient symptoms started on Tuesday so I do not feel that Tamiflu would be beneficial at this time.  Patient does not have any significant wheezing.  No respiratory difficulties.  I do not feel that she requires admission to the hospital for COPD exacerbation associated with her Tamiflu.  Will try her on a course of Tessalon and guaifenesin for her cough.  Discussed close outpatient follow-up with her primary care doctor or pulmonologist.  Patient is comfortable with this plan        Final Clinical Impression(s) / ED Diagnoses Final diagnoses:  Influenza A  Pulmonary emphysema, unspecified emphysema type (HCC)    Rx / DC Orders ED Discharge Orders          Ordered    benzonatate (TESSALON) 100 MG capsule  Every 8 hours        07/04/23 1259    Guaifenesin 1200 MG TB12  2 times daily        07/04/23 1259              Linwood Dibbles, MD 07/04/23 1302

## 2023-07-04 NOTE — ED Triage Notes (Signed)
 Pt reports that she has a cough and some shortness of breath.  States that she has been sick since Tuesday. Pt does have PRN oxygen at needed. Pt is congested and respiratory called to triage.

## 2023-07-04 NOTE — Discharge Instructions (Signed)
 Take the medications to help with your cough and congestion.  The influenza should resolve on its own over time.  Follow-up with your pulmonary doctor to be rechecked.  Return to emergency room if you start having worsening symptoms including high fever or breathing difficulties

## 2023-07-07 DIAGNOSIS — H353211 Exudative age-related macular degeneration, right eye, with active choroidal neovascularization: Secondary | ICD-10-CM | POA: Diagnosis not present

## 2023-07-13 DIAGNOSIS — R278 Other lack of coordination: Secondary | ICD-10-CM | POA: Diagnosis not present

## 2023-07-13 DIAGNOSIS — R293 Abnormal posture: Secondary | ICD-10-CM | POA: Diagnosis not present

## 2023-07-13 DIAGNOSIS — M542 Cervicalgia: Secondary | ICD-10-CM | POA: Diagnosis not present

## 2023-07-13 DIAGNOSIS — R531 Weakness: Secondary | ICD-10-CM | POA: Diagnosis not present

## 2023-07-15 DIAGNOSIS — R293 Abnormal posture: Secondary | ICD-10-CM | POA: Diagnosis not present

## 2023-07-15 DIAGNOSIS — M542 Cervicalgia: Secondary | ICD-10-CM | POA: Diagnosis not present

## 2023-07-17 DIAGNOSIS — R278 Other lack of coordination: Secondary | ICD-10-CM | POA: Diagnosis not present

## 2023-07-17 DIAGNOSIS — R531 Weakness: Secondary | ICD-10-CM | POA: Diagnosis not present

## 2023-07-20 DIAGNOSIS — R531 Weakness: Secondary | ICD-10-CM | POA: Diagnosis not present

## 2023-07-20 DIAGNOSIS — R278 Other lack of coordination: Secondary | ICD-10-CM | POA: Diagnosis not present

## 2023-07-20 DIAGNOSIS — R293 Abnormal posture: Secondary | ICD-10-CM | POA: Diagnosis not present

## 2023-07-20 DIAGNOSIS — M542 Cervicalgia: Secondary | ICD-10-CM | POA: Diagnosis not present

## 2023-07-22 ENCOUNTER — Encounter (HOSPITAL_BASED_OUTPATIENT_CLINIC_OR_DEPARTMENT_OTHER): Payer: Self-pay | Admitting: Emergency Medicine

## 2023-07-22 ENCOUNTER — Emergency Department (HOSPITAL_BASED_OUTPATIENT_CLINIC_OR_DEPARTMENT_OTHER)

## 2023-07-22 ENCOUNTER — Other Ambulatory Visit: Payer: Self-pay

## 2023-07-22 ENCOUNTER — Inpatient Hospital Stay (HOSPITAL_BASED_OUTPATIENT_CLINIC_OR_DEPARTMENT_OTHER)
Admission: EM | Admit: 2023-07-22 | Discharge: 2023-07-25 | DRG: 190 | Disposition: A | Discharge status: Home / Self Care | Attending: Internal Medicine | Admitting: Internal Medicine

## 2023-07-22 DIAGNOSIS — R54 Age-related physical debility: Secondary | ICD-10-CM | POA: Diagnosis present

## 2023-07-22 DIAGNOSIS — Z87891 Personal history of nicotine dependence: Secondary | ICD-10-CM | POA: Diagnosis not present

## 2023-07-22 DIAGNOSIS — Z8249 Family history of ischemic heart disease and other diseases of the circulatory system: Secondary | ICD-10-CM

## 2023-07-22 DIAGNOSIS — Z85828 Personal history of other malignant neoplasm of skin: Secondary | ICD-10-CM

## 2023-07-22 DIAGNOSIS — E876 Hypokalemia: Secondary | ICD-10-CM | POA: Diagnosis present

## 2023-07-22 DIAGNOSIS — K449 Diaphragmatic hernia without obstruction or gangrene: Secondary | ICD-10-CM | POA: Diagnosis not present

## 2023-07-22 DIAGNOSIS — I129 Hypertensive chronic kidney disease with stage 1 through stage 4 chronic kidney disease, or unspecified chronic kidney disease: Secondary | ICD-10-CM | POA: Diagnosis present

## 2023-07-22 DIAGNOSIS — M542 Cervicalgia: Secondary | ICD-10-CM | POA: Diagnosis not present

## 2023-07-22 DIAGNOSIS — Z1152 Encounter for screening for COVID-19: Secondary | ICD-10-CM

## 2023-07-22 DIAGNOSIS — Z862 Personal history of diseases of the blood and blood-forming organs and certain disorders involving the immune mechanism: Secondary | ICD-10-CM | POA: Diagnosis not present

## 2023-07-22 DIAGNOSIS — Z888 Allergy status to other drugs, medicaments and biological substances status: Secondary | ICD-10-CM | POA: Diagnosis not present

## 2023-07-22 DIAGNOSIS — E785 Hyperlipidemia, unspecified: Secondary | ICD-10-CM | POA: Diagnosis present

## 2023-07-22 DIAGNOSIS — E875 Hyperkalemia: Secondary | ICD-10-CM | POA: Diagnosis present

## 2023-07-22 DIAGNOSIS — D696 Thrombocytopenia, unspecified: Secondary | ICD-10-CM | POA: Diagnosis present

## 2023-07-22 DIAGNOSIS — Z66 Do not resuscitate: Secondary | ICD-10-CM | POA: Diagnosis present

## 2023-07-22 DIAGNOSIS — G2581 Restless legs syndrome: Secondary | ICD-10-CM | POA: Diagnosis present

## 2023-07-22 DIAGNOSIS — Z9841 Cataract extraction status, right eye: Secondary | ICD-10-CM | POA: Diagnosis not present

## 2023-07-22 DIAGNOSIS — D649 Anemia, unspecified: Secondary | ICD-10-CM | POA: Diagnosis present

## 2023-07-22 DIAGNOSIS — R0602 Shortness of breath: Secondary | ICD-10-CM | POA: Diagnosis not present

## 2023-07-22 DIAGNOSIS — I700 Atherosclerosis of aorta: Secondary | ICD-10-CM | POA: Diagnosis not present

## 2023-07-22 DIAGNOSIS — J9621 Acute and chronic respiratory failure with hypoxia: Secondary | ICD-10-CM | POA: Diagnosis not present

## 2023-07-22 DIAGNOSIS — Z9842 Cataract extraction status, left eye: Secondary | ICD-10-CM | POA: Diagnosis not present

## 2023-07-22 DIAGNOSIS — Z79899 Other long term (current) drug therapy: Secondary | ICD-10-CM

## 2023-07-22 DIAGNOSIS — I480 Paroxysmal atrial fibrillation: Secondary | ICD-10-CM | POA: Diagnosis present

## 2023-07-22 DIAGNOSIS — J441 Chronic obstructive pulmonary disease with (acute) exacerbation: Principal | ICD-10-CM | POA: Diagnosis present

## 2023-07-22 DIAGNOSIS — J439 Emphysema, unspecified: Secondary | ICD-10-CM | POA: Diagnosis present

## 2023-07-22 DIAGNOSIS — Z825 Family history of asthma and other chronic lower respiratory diseases: Secondary | ICD-10-CM

## 2023-07-22 DIAGNOSIS — N1832 Chronic kidney disease, stage 3b: Secondary | ICD-10-CM | POA: Diagnosis present

## 2023-07-22 DIAGNOSIS — Z96653 Presence of artificial knee joint, bilateral: Secondary | ICD-10-CM | POA: Diagnosis present

## 2023-07-22 DIAGNOSIS — K219 Gastro-esophageal reflux disease without esophagitis: Secondary | ICD-10-CM | POA: Diagnosis present

## 2023-07-22 DIAGNOSIS — B348 Other viral infections of unspecified site: Secondary | ICD-10-CM | POA: Diagnosis present

## 2023-07-22 DIAGNOSIS — R293 Abnormal posture: Secondary | ICD-10-CM | POA: Diagnosis not present

## 2023-07-22 LAB — COMPREHENSIVE METABOLIC PANEL
ALT: 18 U/L (ref 0–44)
AST: 25 U/L (ref 15–41)
Albumin: 3 g/dL — ABNORMAL LOW (ref 3.5–5.0)
Alkaline Phosphatase: 105 U/L (ref 38–126)
Anion gap: 10 (ref 5–15)
BUN: 27 mg/dL — ABNORMAL HIGH (ref 8–23)
CO2: 22 mmol/L (ref 22–32)
Calcium: 8.3 mg/dL — ABNORMAL LOW (ref 8.9–10.3)
Chloride: 102 mmol/L (ref 98–111)
Creatinine, Ser: 1.3 mg/dL — ABNORMAL HIGH (ref 0.44–1.00)
GFR, Estimated: 39 mL/min — ABNORMAL LOW (ref >60)
Glucose, Bld: 93 mg/dL (ref 70–99)
Potassium: 5.2 mmol/L — ABNORMAL HIGH (ref 3.5–5.1)
Sodium: 134 mmol/L — ABNORMAL LOW (ref 135–145)
Total Bilirubin: 0.8 mg/dL (ref 0.0–1.2)
Total Protein: 6.8 g/dL (ref 6.5–8.1)

## 2023-07-22 LAB — CBC WITH DIFFERENTIAL/PLATELET
Abs Immature Granulocytes: 0.23 10*3/uL — ABNORMAL HIGH (ref 0.00–0.07)
Basophils Absolute: 0.2 10*3/uL — ABNORMAL HIGH (ref 0.0–0.1)
Basophils Relative: 3 %
Eosinophils Absolute: 0.1 10*3/uL (ref 0.0–0.5)
Eosinophils Relative: 2 %
HCT: 27.7 % — ABNORMAL LOW (ref 36.0–46.0)
Hemoglobin: 8.8 g/dL — ABNORMAL LOW (ref 12.0–15.0)
Immature Granulocytes: 5 %
Lymphocytes Relative: 9 %
Lymphs Abs: 0.4 10*3/uL — ABNORMAL LOW (ref 0.7–4.0)
MCH: 25 pg — ABNORMAL LOW (ref 26.0–34.0)
MCHC: 31.8 g/dL (ref 30.0–36.0)
MCV: 78.7 fL — ABNORMAL LOW (ref 80.0–100.0)
Monocytes Absolute: 0.7 10*3/uL (ref 0.1–1.0)
Monocytes Relative: 14 %
Neutro Abs: 3.1 10*3/uL (ref 1.7–7.7)
Neutrophils Relative %: 67 %
Platelets: 115 10*3/uL — ABNORMAL LOW (ref 150–400)
RBC: 3.52 MIL/uL — ABNORMAL LOW (ref 3.87–5.11)
RDW: 23.3 % — ABNORMAL HIGH (ref 11.5–15.5)
WBC: 4.7 10*3/uL (ref 4.0–10.5)
nRBC: 0 % (ref 0.0–0.2)

## 2023-07-22 LAB — TROPONIN I (HIGH SENSITIVITY)
Troponin I (High Sensitivity): 8 ng/L (ref <18)
Troponin I (High Sensitivity): 10 ng/L (ref <18)

## 2023-07-22 LAB — RESP PANEL BY RT-PCR (RSV, FLU A&B, COVID)  RVPGX2
Influenza A by PCR: NEGATIVE
Influenza B by PCR: NEGATIVE
Resp Syncytial Virus by PCR: NEGATIVE
SARS Coronavirus 2 by RT PCR: NEGATIVE

## 2023-07-22 LAB — LIPASE, BLOOD: Lipase: 26 U/L (ref 11–51)

## 2023-07-22 LAB — BRAIN NATRIURETIC PEPTIDE: B Natriuretic Peptide: 288.7 pg/mL — ABNORMAL HIGH (ref 0.0–100.0)

## 2023-07-22 MED ORDER — IPRATROPIUM-ALBUTEROL 0.5-2.5 (3) MG/3ML IN SOLN
3.0000 mL | Freq: Once | RESPIRATORY_TRACT | Status: AC
  Administered 2023-07-22: 3 mL via RESPIRATORY_TRACT
  Filled 2023-07-22: qty 3

## 2023-07-22 MED ORDER — METHYLPREDNISOLONE SODIUM SUCC 125 MG IJ SOLR
125.0000 mg | Freq: Once | INTRAMUSCULAR | Status: AC
  Administered 2023-07-22: 125 mg via INTRAVENOUS
  Filled 2023-07-22: qty 2

## 2023-07-22 MED ORDER — ALBUTEROL SULFATE HFA 108 (90 BASE) MCG/ACT IN AERS
2.0000 | INHALATION_SPRAY | Freq: Once | RESPIRATORY_TRACT | Status: AC
  Administered 2023-07-22: 2 via RESPIRATORY_TRACT
  Filled 2023-07-22: qty 6.7

## 2023-07-22 NOTE — ED Triage Notes (Signed)
 Pt c/o continued cough and SHOB (ongoing for a couple of weeks); has been evaluated for same and was told to return to ED if she became [facility] again; O2 sats initially 88% on RA; placed on 2L O2 by RT

## 2023-07-22 NOTE — ED Notes (Signed)
 Patient transported to [facility] via [facility] at this time

## 2023-07-22 NOTE — Plan of Care (Addendum)
 Plan of Care Note for accepted transfer  Patient: Judith Boone              WUJ:[id number]  DOA: 07/22/2023     Facility requesting transfer: [facility] Requesting Provider: Dr. Jacqulyn Bath  Reason for transfer: Acute hyporespiratory failure due to COPD exacerbation  ED triage note:  Pt c/o continued cough and SHOB (ongoing for a couple of weeks); has been evaluated for same and was told to return to ED if she became [facility] again; O2 sats initially 88% on RA; placed on 2L O2 by RT      Facility course: 89 year old female history of chronic anemia due to COPD, nocturnal hypoxia on 1 to 2 L oxygen at bedtime, CKD stage IIIb, atrial fibrillation (episodes of atrial fibrillation 4 to 5 years ago not on anticoagulation due to anemia), CKD stage IIIb, chronic thrombocytopenia and essential hypertension presented to emergency department evaluation complaint of shortness of breath and cough.h. She is accompanied by her friend who states that they were playing bridge today which she seemed very winded and wheezy. She was not wearing her oxygen at the time but does have as needed oxygen at home. She states she typically wears it at night. 3 weeks ago she was seen in the ED with similar symptoms but feels that they have persisted since that visit. She remains compliant with her home medicines. No chest discomfort. No fevers or chills. No vomiting. She has ongoing diarrhea but that is more of a chronic issue for her.   At present to ED patient blood pressure within good range.  Respiratory rate in between 26-31.  O2 sat dropped to 84% room air currently on 93 to 94% on 3 L oxygen,  Respiratory panel negative. Normal troponin level. CBC unremarkable stable H&H.  Evidence of chronic thrombocytopenia. Elevated BNP around 288. CMP elevated potassium 5.2, low sodium 132, creatinine 1.3, GFR 39, low albumin 3.  Renal function at baseline.  Chest x-ray no active disease process.  Moderate volume hiatal  hernia. In the ED patient has been treated with albuterol nebulizer, DuoNeb and methylprednisolone.  Hospitalist has been consulted for management of COPD exacerbation and acute hypoxic respiratory failure.    Plan of care: The patient is accepted for admission for inpatient status to Telemetry unit, at [facility].  [facility] will assume care on arrival to accepting facility. Until arrival, care as per EDP. However, [facility] available 24/7 for questions and assistance.   Check [email or web address] for on-call coverage.   Nursing staff, please call [facility] Admits & Consults System-Wide number under Amion on patient's arrival so appropriate admitting provider can evaluate the pt.    Author: Tereasa Coop, MD  07/22/2023  [facility]

## 2023-07-22 NOTE — ED Provider Notes (Signed)
 Emergency Department Provider Note   I have reviewed the triage vital signs and the nursing notes.   HISTORY  Chief Complaint Shortness of Breath   HPI Judith Boone is a 89 y.o. female with past history of COPD, CKD, hyperlipidemia presents to the emergency department for evaluation of shortness of breath and cough.  She is accompanied by her friend who states that they were playing bridge today which she seemed very winded and wheezy.  She was not wearing her oxygen at the time but does have as needed oxygen at home.  She states she typically wears it at night.  3 weeks ago she was seen in the ED with similar symptoms but feels that they have persisted since that visit.  She remains compliant with her home medicines.  No chest discomfort.  No fevers or chills.  No vomiting.  She has ongoing diarrhea but that is more of a chronic issue for her.   Past Medical History:  Diagnosis Date   Arthritis    Basal cell carcinoma    on  her face   Blood transfusion without reported diagnosis 2022   Cataract    bilateral sx   Cellulitis of left leg 07/28/2014   hospitalized for 4 days   Chronic kidney disease    COPD (chronic obstructive pulmonary disease) (HCC)    Dysrhythmia    one episode of a-fib no other issues   Emphysema of lung (HCC)    Esophageal web    GERD (gastroesophageal reflux disease)    History of granulomatous disease    History of hiatal hernia    Hyperlipidemia    on meds   Hypertension    on meds   Iron deficiency anemia    Left leg swelling    Paroxysmal atrial fibrillation (HCC)    Pneumonia    2022   Right-sided low back pain with sciatica    Seasonal allergies    Sepsis (HCC) 08/17/2014    Review of Systems  Constitutional: No fever/chills Cardiovascular: Denies chest pain. Respiratory: Positive shortness of breath. Positive cough.  Gastrointestinal: No abdominal pain.  No nausea, no vomiting.  No diarrhea.  Skin: Negative for  rash. Neurological: Negative for headaches.  ____________________________________________   PHYSICAL EXAM:  VITAL SIGNS: ED Triage Vitals  Encounter Vitals Group     BP 07/22/23 1817 (!) 153/71     Pulse Rate 07/22/23 1817 89     Resp 07/22/23 1817 20     Temp 07/22/23 1817 98.2 F (36.8 C)     Temp src --      SpO2 07/22/23 1817 96 %     Weight 07/22/23 1833 115 lb (52.2 kg)     Height 07/22/23 1833 5' (1.524 m)   Constitutional: Alert and oriented. Well appearing and in no acute distress. Eyes: Conjunctivae are normal.  Head: Atraumatic. Nose: No congestion/rhinnorhea. Mouth/Throat: Mucous membranes are moist.  Neck: No stridor.   Cardiovascular: Normal rate, regular rhythm. Good peripheral circulation. Grossly normal heart sounds.   Respiratory: Increased respiratory effort.  No retractions. Lungs CTAB. Gastrointestinal: Soft and nontender. No distention.  Musculoskeletal: No lower extremity tenderness nor edema. No gross deformities of extremities. Neurologic:  Normal speech and language.  Skin:  Skin is warm, dry and intact. No rash noted.   ____________________________________________   LABS (all labs ordered are listed, but only abnormal results are displayed)  Labs Reviewed  RESPIRATORY PANEL BY PCR - Abnormal; Notable for the following components:  Result Value   Parainfluenza Virus 3 DETECTED (*)    All other components within normal limits  COMPREHENSIVE METABOLIC PANEL - Abnormal; Notable for the following components:   Sodium 134 (*)    Potassium 5.2 (*)    BUN 27 (*)    Creatinine, Ser 1.30 (*)    Calcium 8.3 (*)    Albumin 3.0 (*)    GFR, Estimated 39 (*)    All other components within normal limits  BRAIN NATRIURETIC PEPTIDE - Abnormal; Notable for the following components:   B Natriuretic Peptide 288.7 (*)    All other components within normal limits  CBC WITH DIFFERENTIAL/PLATELET - Abnormal; Notable for the following components:   RBC  3.52 (*)    Hemoglobin 8.8 (*)    HCT 27.7 (*)    MCV 78.7 (*)    MCH 25.0 (*)    RDW 23.3 (*)    Platelets 115 (*)    Lymphs Abs 0.4 (*)    Basophils Absolute 0.2 (*)    Abs Immature Granulocytes 0.23 (*)    All other components within normal limits  BASIC METABOLIC PANEL - Abnormal; Notable for the following components:   CO2 16 (*)    Glucose, Bld 135 (*)    BUN 24 (*)    Creatinine, Ser 1.31 (*)    Calcium 8.3 (*)    GFR, Estimated 39 (*)    All other components within normal limits  CBC - Abnormal; Notable for the following components:   WBC 3.1 (*)    RBC 3.57 (*)    Hemoglobin 9.1 (*)    HCT 28.1 (*)    MCV 78.7 (*)    MCH 25.5 (*)    RDW 23.0 (*)    Platelets 112 (*)    All other components within normal limits  PHOSPHORUS - Abnormal; Notable for the following components:   Phosphorus 4.8 (*)    All other components within normal limits  RESP PANEL BY RT-PCR (RSV, FLU A&B, COVID)  RVPGX2  EXPECTORATED SPUTUM ASSESSMENT W GRAM STAIN, RFLX TO RESP C  LIPASE, BLOOD  MAGNESIUM  TROPONIN I (HIGH SENSITIVITY)  TROPONIN I (HIGH SENSITIVITY)   ____________________________________________  EKG  Rate: 93 PR: 243 QTc: 441  Sinus rhythm. Narrow QRS. No ST elevation or depression.  ____________________________________________   PROCEDURES  Procedure(s) performed:   Procedures  CRITICAL CARE Performed by: Maia Plan Total critical care time: 35 minutes Critical care time was exclusive of separately billable procedures and treating other patients. Critical care was necessary to treat or prevent imminent or life-threatening deterioration. Critical care was time spent personally by me on the following activities: development of treatment plan with patient and/or surrogate as well as nursing, discussions with consultants, evaluation of patient's response to treatment, examination of patient, obtaining history from patient or surrogate, ordering and performing  treatments and interventions, ordering and review of laboratory studies, ordering and review of radiographic studies, pulse oximetry and re-evaluation of patient's condition.  Alona Bene, MD Emergency Medicine  ____________________________________________   INITIAL IMPRESSION / ASSESSMENT AND PLAN / ED COURSE  Pertinent labs & imaging results that were available during my care of the patient were reviewed by me and considered in my medical decision making (see chart for details).   This patient is Presenting for Evaluation of SOB, which does require a range of treatment options, and is a complaint that involves a high risk of morbidity and mortality.  The Differential Diagnoses includes  COPD, CHF, ACS, PE, COVID/Flu, CAP, etc.  Critical Interventions-    Medications  enoxaparin (LOVENOX) injection 30 mg (30 mg Subcutaneous Given 07/24/23 1031)  sodium chloride flush (NS) 0.9 % injection 3 mL (3 mLs Intravenous Given 07/24/23 1032)  albuterol (PROVENTIL) (2.5 MG/3ML) 0.083% nebulizer solution 2.5 mg (has no administration in time range)  ipratropium-albuterol (DUONEB) 0.5-2.5 (3) MG/3ML nebulizer solution 3 mL (3 mLs Nebulization Not Given 07/24/23 1441)  cefTRIAXone (ROCEPHIN) 1 g in sodium chloride 0.9 % 100 mL IVPB (1 g Intravenous New Bag/Given 07/23/23 2110)  predniSONE (DELTASONE) tablet 40 mg (40 mg Oral Given 07/24/23 1031)  acetaminophen (TYLENOL) tablet 1,000 mg (has no administration in time range)  melatonin tablet 6 mg (has no administration in time range)  ondansetron (ZOFRAN) injection 4 mg (has no administration in time range)  polyethylene glycol (MIRALAX / GLYCOLAX) packet 17 g (has no administration in time range)  0.9 %  sodium chloride infusion (0 mLs Intravenous Stopped 07/24/23 1032)  amLODipine (NORVASC) tablet 5 mg (5 mg Oral Given 07/23/23 2103)  umeclidinium-vilanterol (ANORO ELLIPTA) 62.5-25 MCG/ACT 1 puff (1 puff Inhalation Given 07/24/23 0815)  atorvastatin  (LIPITOR) tablet 20 mg (20 mg Oral Given 07/23/23 2103)  loratadine (CLARITIN) tablet 10 mg (10 mg Oral Given 07/24/23 1032)  hydrALAZINE (APRESOLINE) tablet 25 mg (25 mg Oral Given 07/24/23 1032)  metoprolol succinate (TOPROL-XL) 24 hr tablet 25 mg (25 mg Oral Given 07/24/23 1031)  montelukast (SINGULAIR) tablet 10 mg (10 mg Oral Given 07/24/23 1031)  pantoprazole (PROTONIX) EC tablet 40 mg (40 mg Oral Given 07/24/23 1031)  pramipexole (MIRAPEX) tablet 0.25 mg (0.25 mg Oral Given 07/24/23 1031)  dextromethorphan-guaiFENesin (MUCINEX DM) 30-600 MG per 12 hr tablet 1 tablet (1 tablet Oral Given 07/24/23 1031)  albuterol (VENTOLIN HFA) 108 (90 Base) MCG/ACT inhaler 2 puff (2 puffs Inhalation Given 07/22/23 1917)  ipratropium-albuterol (DUONEB) 0.5-2.5 (3) MG/3ML nebulizer solution 3 mL (3 mLs Nebulization Given 07/22/23 1933)  methylPREDNISolone sodium succinate (SOLU-MEDROL) 125 mg/2 mL injection 125 mg (125 mg Intravenous Given 07/22/23 2029)    Reassessment after intervention: breathing improved.    I did obtain Additional Historical Information from friend at bedside.   Clinical Laboratory Tests Ordered, included viral panel negative. CBC with mild anemia with Hb 8.8. No AKI. Troponin negative.   Radiologic Tests Ordered, included CXR. I independently interpreted the images and agree with radiology interpretation.   Cardiac Monitor Tracing which shows NSR.    Social Determinants of Health Risk patient is not an active smoker.   Consult complete with [facility]. Plan for admit.   Medical Decision Making: Summary:  Patient presents to the emergency department with shortness of breath.  Patient with O2 requirement above her baseline currently.  Some increased work of breathing.  Seems consistent with COPD.  Plan for neb, labs, repeat chest x-ray and reassess.  Reevaluation with update and discussion with patient. Now on 3L [place]. Plan for admit for COPD exacerbation. She is in agreement.   Patient's  presentation is most consistent with acute presentation with potential threat to life or bodily function.   Disposition: admit  ____________________________________________  FINAL CLINICAL IMPRESSION(S) / ED DIAGNOSES  Final diagnoses:  COPD exacerbation (HCC)    Note:  This document was prepared using Dragon voice recognition software and may include unintentional dictation errors.  Alona Bene, MD, [facility] Emergency Medicine    Leila Schuff, Arlyss Repress, MD 07/24/23 [phone]

## 2023-07-22 NOTE — ED Notes (Signed)
   07/22/23 1817  Respiratory Assessment  $ RT Protocol Assessment  Yes  Assessment Type Assess only  Respiratory Pattern Regular;Unlabored;Symmetrical;Shallow  Chest Assessment Chest expansion symmetrical  Cough Congested  Bilateral Breath Sounds Diminished  Oxygen Therapy/Pulse Ox  O2 Device Nasal Cannula  O2 Therapy Oxygen  O2 Flow Rate (L/min) 2 L/min   Patient seen before triage, BBS distant, SpO2 on room air at rest 88%.  Moved to triage room and placed on 2lpm [place].  RT to monitor

## 2023-07-23 DIAGNOSIS — Z9841 Cataract extraction status, right eye: Secondary | ICD-10-CM | POA: Diagnosis not present

## 2023-07-23 DIAGNOSIS — J441 Chronic obstructive pulmonary disease with (acute) exacerbation: Secondary | ICD-10-CM

## 2023-07-23 DIAGNOSIS — G2581 Restless legs syndrome: Secondary | ICD-10-CM | POA: Diagnosis present

## 2023-07-23 DIAGNOSIS — E785 Hyperlipidemia, unspecified: Secondary | ICD-10-CM | POA: Diagnosis present

## 2023-07-23 DIAGNOSIS — Z96653 Presence of artificial knee joint, bilateral: Secondary | ICD-10-CM | POA: Diagnosis present

## 2023-07-23 DIAGNOSIS — E875 Hyperkalemia: Secondary | ICD-10-CM | POA: Diagnosis present

## 2023-07-23 DIAGNOSIS — B348 Other viral infections of unspecified site: Secondary | ICD-10-CM | POA: Diagnosis present

## 2023-07-23 DIAGNOSIS — D696 Thrombocytopenia, unspecified: Secondary | ICD-10-CM | POA: Diagnosis present

## 2023-07-23 DIAGNOSIS — J439 Emphysema, unspecified: Secondary | ICD-10-CM | POA: Diagnosis present

## 2023-07-23 DIAGNOSIS — Z66 Do not resuscitate: Secondary | ICD-10-CM | POA: Diagnosis present

## 2023-07-23 DIAGNOSIS — Z87891 Personal history of nicotine dependence: Secondary | ICD-10-CM | POA: Diagnosis not present

## 2023-07-23 DIAGNOSIS — Z888 Allergy status to other drugs, medicaments and biological substances status: Secondary | ICD-10-CM | POA: Diagnosis not present

## 2023-07-23 DIAGNOSIS — N1832 Chronic kidney disease, stage 3b: Secondary | ICD-10-CM | POA: Diagnosis present

## 2023-07-23 DIAGNOSIS — Z85828 Personal history of other malignant neoplasm of skin: Secondary | ICD-10-CM | POA: Diagnosis not present

## 2023-07-23 DIAGNOSIS — Z1152 Encounter for screening for COVID-19: Secondary | ICD-10-CM | POA: Diagnosis not present

## 2023-07-23 DIAGNOSIS — I129 Hypertensive chronic kidney disease with stage 1 through stage 4 chronic kidney disease, or unspecified chronic kidney disease: Secondary | ICD-10-CM | POA: Diagnosis present

## 2023-07-23 DIAGNOSIS — J9621 Acute and chronic respiratory failure with hypoxia: Secondary | ICD-10-CM

## 2023-07-23 DIAGNOSIS — I480 Paroxysmal atrial fibrillation: Secondary | ICD-10-CM | POA: Diagnosis present

## 2023-07-23 DIAGNOSIS — D649 Anemia, unspecified: Secondary | ICD-10-CM | POA: Diagnosis present

## 2023-07-23 DIAGNOSIS — E876 Hypokalemia: Secondary | ICD-10-CM | POA: Diagnosis present

## 2023-07-23 DIAGNOSIS — R54 Age-related physical debility: Secondary | ICD-10-CM | POA: Diagnosis present

## 2023-07-23 DIAGNOSIS — Z825 Family history of asthma and other chronic lower respiratory diseases: Secondary | ICD-10-CM | POA: Diagnosis not present

## 2023-07-23 DIAGNOSIS — Z9842 Cataract extraction status, left eye: Secondary | ICD-10-CM | POA: Diagnosis not present

## 2023-07-23 DIAGNOSIS — Z8249 Family history of ischemic heart disease and other diseases of the circulatory system: Secondary | ICD-10-CM | POA: Diagnosis not present

## 2023-07-23 LAB — BASIC METABOLIC PANEL WITH GFR
Anion gap: 15 (ref 5–15)
BUN: 24 mg/dL — ABNORMAL HIGH (ref 8–23)
CO2: 16 mmol/L — ABNORMAL LOW (ref 22–32)
Calcium: 8.3 mg/dL — ABNORMAL LOW (ref 8.9–10.3)
Chloride: 105 mmol/L (ref 98–111)
Creatinine, Ser: 1.31 mg/dL — ABNORMAL HIGH (ref 0.44–1.00)
GFR, Estimated: 39 mL/min — ABNORMAL LOW (ref >60)
Glucose, Bld: 135 mg/dL — ABNORMAL HIGH (ref 70–99)
Potassium: 5.1 mmol/L (ref 3.5–5.1)
Sodium: 136 mmol/L (ref 135–145)

## 2023-07-23 LAB — RESPIRATORY PANEL BY PCR

## 2023-07-23 LAB — CBC
HCT: 28.1 % — ABNORMAL LOW (ref 36.0–46.0)
Hemoglobin: 9.1 g/dL — ABNORMAL LOW (ref 12.0–15.0)
MCH: 25.5 pg — ABNORMAL LOW (ref 26.0–34.0)
MCHC: 32.4 g/dL (ref 30.0–36.0)
MCV: 78.7 fL — ABNORMAL LOW (ref 80.0–100.0)
Platelets: 112 10*3/uL — ABNORMAL LOW (ref 150–400)
RBC: 3.57 MIL/uL — ABNORMAL LOW (ref 3.87–5.11)
RDW: 23 % — ABNORMAL HIGH (ref 11.5–15.5)
WBC: 3.1 10*3/uL — ABNORMAL LOW (ref 4.0–10.5)
nRBC: 0 % (ref 0.0–0.2)

## 2023-07-23 LAB — EXPECTORATED SPUTUM ASSESSMENT W GRAM STAIN, RFLX TO RESP C

## 2023-07-23 LAB — PHOSPHORUS: Phosphorus: 4.8 mg/dL — ABNORMAL HIGH (ref 2.5–4.6)

## 2023-07-23 LAB — MAGNESIUM: Magnesium: 1.7 mg/dL (ref 1.7–2.4)

## 2023-07-23 MED ORDER — ONDANSETRON HCL 4 MG/2ML IJ SOLN: 4.0000 mg | Freq: Four times a day (QID) | INTRAMUSCULAR | Status: DC | PRN

## 2023-07-23 MED ORDER — MONTELUKAST SODIUM 10 MG PO TABS
10.0000 mg | ORAL_TABLET | Freq: Every day | ORAL | Status: DC
  Administered 2023-07-23 – 2023-07-25 (×3): 10 mg via ORAL
  Filled 2023-07-23 (×3): qty 1

## 2023-07-23 MED ORDER — AMLODIPINE BESYLATE 5 MG PO TABS
5.0000 mg | ORAL_TABLET | Freq: Every day | ORAL | Status: DC
Start: 2023-07-23 — End: 2023-07-25
  Administered 2023-07-23 – 2023-07-24 (×2): 5 mg via ORAL
  Filled 2023-07-23 (×2): qty 1

## 2023-07-23 MED ORDER — DM-GUAIFENESIN ER 30-600 MG PO TB12
1.0000 | ORAL_TABLET | Freq: Two times a day (BID) | ORAL | Status: DC
  Administered 2023-07-23 – 2023-07-25 (×5): 1 via ORAL
  Filled 2023-07-23 (×5): qty 1

## 2023-07-23 MED ORDER — SODIUM CHLORIDE 0.9 % IV SOLN
1.0000 g | Freq: Every day | INTRAVENOUS | Status: DC
  Administered 2023-07-23 – 2023-07-24 (×3): 1 g via INTRAVENOUS
  Filled 2023-07-23 (×3): qty 10

## 2023-07-23 MED ORDER — LORATADINE 10 MG PO TABS
10.0000 mg | ORAL_TABLET | Freq: Every day | ORAL | Status: DC
  Administered 2023-07-23 – 2023-07-25 (×3): 10 mg via ORAL
  Filled 2023-07-23 (×3): qty 1

## 2023-07-23 MED ORDER — UMECLIDINIUM-VILANTEROL 62.5-25 MCG/ACT IN AEPB
1.0000 | INHALATION_SPRAY | Freq: Every day | RESPIRATORY_TRACT | Status: DC
  Administered 2023-07-23 – 2023-07-25 (×3): 1 via RESPIRATORY_TRACT
  Filled 2023-07-23: qty 14

## 2023-07-23 MED ORDER — PANTOPRAZOLE SODIUM 40 MG PO TBEC
40.0000 mg | DELAYED_RELEASE_TABLET | Freq: Every day | ORAL | Status: DC
  Administered 2023-07-23 – 2023-07-25 (×3): 40 mg via ORAL
  Filled 2023-07-23 (×3): qty 1

## 2023-07-23 MED ORDER — HYDRALAZINE HCL 25 MG PO TABS
25.0000 mg | ORAL_TABLET | Freq: Two times a day (BID) | ORAL | Status: DC
  Administered 2023-07-23 – 2023-07-25 (×5): 25 mg via ORAL
  Filled 2023-07-23 (×5): qty 1

## 2023-07-23 MED ORDER — POLYETHYLENE GLYCOL 3350 17 G PO PACK: 17.0000 g | PACK | Freq: Every day | ORAL | Status: DC | PRN

## 2023-07-23 MED ORDER — SODIUM CHLORIDE 0.9 % IV SOLN: INTRAVENOUS | Status: AC | PRN

## 2023-07-23 MED ORDER — ATORVASTATIN CALCIUM 10 MG PO TABS
20.0000 mg | ORAL_TABLET | Freq: Every day | ORAL | Status: DC
  Administered 2023-07-23 – 2023-07-24 (×2): 20 mg via ORAL
  Filled 2023-07-23 (×2): qty 2

## 2023-07-23 MED ORDER — ACETAMINOPHEN 500 MG PO TABS: 1000.0000 mg | ORAL_TABLET | Freq: Four times a day (QID) | ORAL | Status: DC | PRN

## 2023-07-23 MED ORDER — MELATONIN 3 MG PO TABS: 6.0000 mg | ORAL_TABLET | Freq: Every evening | ORAL | Status: DC | PRN

## 2023-07-23 MED ORDER — ALBUTEROL SULFATE (2.5 MG/3ML) 0.083% IN NEBU: 2.5000 mg | INHALATION_SOLUTION | RESPIRATORY_TRACT | Status: DC | PRN

## 2023-07-23 MED ORDER — SODIUM CHLORIDE 0.9% FLUSH
3.0000 mL | Freq: Two times a day (BID) | INTRAVENOUS | Status: DC
  Administered 2023-07-23 – 2023-07-25 (×5): 3 mL via INTRAVENOUS

## 2023-07-23 MED ORDER — IPRATROPIUM-ALBUTEROL 0.5-2.5 (3) MG/3ML IN SOLN
3.0000 mL | Freq: Four times a day (QID) | RESPIRATORY_TRACT | Status: DC
  Administered 2023-07-23 – 2023-07-25 (×5): 3 mL via RESPIRATORY_TRACT
  Filled 2023-07-23 (×7): qty 3

## 2023-07-23 MED ORDER — PRAMIPEXOLE DIHYDROCHLORIDE 0.25 MG PO TABS
0.2500 mg | ORAL_TABLET | Freq: Two times a day (BID) | ORAL | Status: DC
Start: 2023-07-23 — End: 2023-07-25
  Administered 2023-07-23 – 2023-07-25 (×5): 0.25 mg via ORAL
  Filled 2023-07-23 (×6): qty 1

## 2023-07-23 MED ORDER — PREDNISONE 20 MG PO TABS
40.0000 mg | ORAL_TABLET | Freq: Every day | ORAL | Status: DC
  Administered 2023-07-23 – 2023-07-25 (×3): 40 mg via ORAL
  Filled 2023-07-23 (×3): qty 2

## 2023-07-23 MED ORDER — ENOXAPARIN SODIUM 30 MG/0.3ML IJ SOSY
30.0000 mg | PREFILLED_SYRINGE | Freq: Every day | INTRAMUSCULAR | Status: DC
  Administered 2023-07-23 – 2023-07-25 (×3): 30 mg via SUBCUTANEOUS
  Filled 2023-07-23 (×3): qty 0.3

## 2023-07-23 MED ORDER — METOPROLOL SUCCINATE ER 25 MG PO TB24
25.0000 mg | ORAL_TABLET | Freq: Every day | ORAL | Status: DC
  Administered 2023-07-23 – 2023-07-25 (×3): 25 mg via ORAL
  Filled 2023-07-23 (×3): qty 1

## 2023-07-23 NOTE — Progress Notes (Signed)
 Patient seen and examined.  Admitted early morning hours by nighttime hospitalist.  She lives in senior living apartment.  She has history of COPD and uses 1 to 2 L of oxygen at night.  Patient was diagnosed with influenza A on 3/8 and since then she has unrelenting cough and difficult to clear secretions.  Patient was unable to keep oxygen saturations more than 90% on 3 L of oxygen that she uses at home so came to the emergency room.  Chest x-ray with mostly chronic changes.  With significant symptoms she was admitted to the hospital.  Agree with admission to monitored unit because of severity of symptoms. Aggressive bronchodilator therapy, IV steroids-oral steroids, inhalational steroids, scheduled and as needed bronchodilators, deep breathing exercises, incentive spirometry, chest physiotherapy .  Antibiotics due to severity of symptoms.  On Rocephin. Supplemental oxygen to keep saturations more than 90%. Mobilize with PT OT.   Same-day admit.  No charge visit.

## 2023-07-23 NOTE — Evaluation (Signed)
 Occupational Therapy Evaluation Patient Details Name: Judith Boone MRN: [id number] DOB: October 01, 1931 Today's Date: 07/23/2023   History of Present Illness   89 y.o. 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  Diagnosed with influenza A     Clinical Impressions Patient admitted for the diagnosis above.  PTA she lives at a local ILF, uses a 4WRW in the halls, and completes her own ADL with the use of a hip kit.  Patient lives on the 3rd follow with an elevator.  Patient is very close to baseline with no OT needs identified.  Defer mobility to mobility team.       If plan is discharge home, recommend the following:   Assist for transportation     Functional Status Assessment   Patient has not had a recent decline in their functional status     Equipment Recommendations   None recommended by OT     Recommendations for Other Services         Precautions/Restrictions   Precautions Precautions: Fall Recall of Precautions/Restrictions: Intact Restrictions Weight Bearing Restrictions Per Provider Order: No     Mobility Bed Mobility Overal bed mobility: Modified Independent               Patient Response: Cooperative  Transfers Overall transfer level: Modified independent Equipment used: Rollator (4 wheels)                      Balance Overall balance assessment: Mild deficits observed, not formally tested                                         ADL either performed or assessed with clinical judgement   ADL Overall ADL's : At baseline                                             Vision Baseline Vision/History: 1 Wears glasses Patient Visual Report: No change from baseline       Perception Perception: Within Functional Limits       Praxis Praxis: WFL        Pertinent Vitals/Pain Pain Assessment Pain Assessment: No/denies pain     Extremity/Trunk Assessment Upper Extremity Assessment Upper Extremity Assessment: Overall WFL for tasks assessed   Lower Extremity Assessment Lower Extremity Assessment: Defer to PT evaluation   Cervical / Trunk Assessment Cervical / Trunk Assessment: Kyphotic   Communication Communication Communication: No apparent difficulties Factors Affecting Communication: Hearing impaired   Cognition Arousal: Alert Behavior During Therapy: WFL for tasks assessed/performed Cognition: No apparent impairments                               Following commands: Intact       Cueing  General Comments   Cueing Techniques: Verbal cues   vSS on O2   Exercises     Shoulder Instructions      Home Living Family/patient expects to be discharged to:: Other (Comment) (ILF)  Prior Functioning/Environment Prior Level of Function : Independent/Modified Independent;Driving             Mobility Comments: 4 WRW in the halls/community. ADLs Comments: Mod I for ADL.  Has reacher and sock aid.    OT Problem List: Decreased activity tolerance   OT Treatment/Interventions:        OT Goals(Current goals can be found in the care plan section)   Acute Rehab OT Goals Patient Stated Goal: Return to ILF OT Goal Formulation: With patient Time For Goal Achievement: 07/27/23 Potential to Achieve Goals: Good   OT Frequency:       Co-evaluation              AM-PAC OT "6 Clicks" Daily Activity     Outcome Measure Help from another person eating meals?: None Help from another person taking care of personal grooming?: None Help from another person toileting, which includes using toliet, bedpan, or urinal?: None Help from another person bathing (including washing, rinsing, drying)?: None Help from another person to put on and taking off  regular upper body clothing?: None Help from another person to put on and taking off regular lower body clothing?: None 6 Click Score: 24   End of Session Equipment Utilized During Treatment: Rollator (4 wheels) Nurse Communication: Mobility status  Activity Tolerance: Patient tolerated treatment well Patient left: in chair;with call bell/phone within reach;with chair alarm set  OT Visit Diagnosis: Muscle weakness (generalized) (M62.81)                Time: 4259-5638 OT Time Calculation (min): 24 min Charges:  OT General Charges $OT Visit: 1 Visit OT Evaluation $OT Eval Moderate Complexity: 1 Mod OT Treatments $Self Care/Home Management : 8-22 mins  07/23/2023  RP, OTR/L  Acute Rehabilitation Services  Office:  [phone]   Suzanna Obey 07/23/2023, 10:53 AM

## 2023-07-23 NOTE — Plan of Care (Signed)
  Problem: Education: Goal: Knowledge of General Education information will improve Description: Including pain rating scale, medication(s)/side effects and non-pharmacologic comfort measures Outcome: Progressing   Problem: Health Behavior/Discharge Planning: Goal: Ability to manage health-related needs will improve Outcome: Progressing   Problem: Clinical Measurements: Goal: Will remain free from infection Outcome: Progressing   Problem: Activity: Goal: Risk for activity intolerance will decrease Outcome: Progressing   Problem: Nutrition: Goal: Adequate nutrition will be maintained Outcome: Progressing   Problem: Elimination: Goal: Will not experience complications related to bowel motility Outcome: Progressing   Problem: Pain Managment: Goal: General experience of comfort will improve and/or be controlled Outcome: Progressing   Problem: Safety: Goal: Ability to remain free from injury will improve Outcome: Progressing

## 2023-07-23 NOTE — H&P (Signed)
 History and Physical    Judith Boone UUV:[id number] DOB: Dec 12, 1931 DOA: 07/22/2023  PCP: [name], MD   Patient coming from: Transfer [facility]    Chief Complaint:  Chief Complaint  Patient presents with   Shortness of Breath    HPI:  ELAH Boone is a 89 y.o. 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  Diagnosed with influenza A on 3/8.  Reports that since this time has had persistently low oxygen saturations and has been requiring 3 L on her home concentrator (reports max 3 L) but still having O2 sats less than 90.  Over the past weekend developing worsening shortness of breath, cough which is nonproductive, nasal congestion.  No chest pain.  Reports sick contacts with residents from her nursing facility.    Review of Systems:  ROS complete and negative except as marked above   Allergies  Allergen Reactions   Bacitracin     Makes wound worse    Neosporin [Neomycin-Bacitracin Zn-Polymyx]     Makes wound worse    Polysporin [Bacitracin-Polymyxin B]     Makes wound worse     Prior to Admission medications   Medication Sig Start Date End Date Taking? Authorizing Provider  amLODipine (NORVASC) 5 MG tablet Take 5 mg by mouth at bedtime.  04/13/15   [provider]  ANORO ELLIPTA 62.5-25 MCG/INH AEPB Inhale 1 puff into the lungs daily. 07/04/18   [provider]  Ascorbic Acid (VITAMIN C PO) Take 1 tablet by mouth daily.    [provider]  atorvastatin (LIPITOR) 20 MG tablet Take 20 mg by mouth at bedtime.     [provider]  benzonatate (TESSALON) 100 MG capsule Take 1 capsule (100 mg total) by mouth every 8 (eight) hours. 07/04/23   Linwood Dibbles, MD  Calcium Citrate-Vitamin D (CALCIUM + D PO) Take 1 tablet by mouth 2 (two) times daily.    [provider]  cetirizine (ZYRTEC) 10 MG tablet  Take 10 mg by mouth daily as needed for allergies.    [provider]  Cholecalciferol 25 MCG (1000 UT) capsule Take 1,000 Units by mouth daily. 04/28/17   [provider]  ferrous sulfate 325 (65 FE) MG tablet Take 325 mg by mouth daily.    [provider]  Guaifenesin 1200 MG TB12 Take 1 tablet (1,200 mg total) by mouth 2 (two) times daily at 10 AM and 5 PM. 07/04/23   Linwood Dibbles, MD  hydrALAZINE (APRESOLINE) 25 MG tablet Take 25 mg by mouth 2 (two) times daily.     [provider]  losartan (COZAAR) 50 MG tablet Take 50 mg by mouth daily. 08/21/20   [provider]  metoprolol succinate (TOPROL-XL) 25 MG 24 hr tablet Take 1 tablet (25 mg total) by mouth daily. 07/23/18   Fenton, Clint R, PA  montelukast (SINGULAIR) 10 MG tablet Take 10 mg by mouth daily. 08/02/19   [provider]  Multiple Vitamin (MULTIVITAMIN) tablet Take 1 tablet by mouth daily.    [provider]  omeprazole (PRILOSEC) 40 MG capsule Take 40 mg by mouth daily.  07/22/18   [provider]  OXYGEN Inhale into the lungs. As needed    [provider]  pramipexole (MIRAPEX) 0.25 MG tablet Take 0.25 mg by mouth at bedtime. 02/04/21   [provider]  Spacer/Aero-Holding Chambers (AEROCHAMBER MV) inhaler Use as instructed with Albuterol HFA 10/07/19   Parrett, Tammy S, NP  VENTOLIN HFA 108 (90 BASE) MCG/ACT inhaler Inhale 1-2 puffs into the lungs every 6 (six) hours as needed for wheezing.  03/03/15   [provider]    Past Medical History:  Diagnosis Date   Arthritis    Basal cell carcinoma    on  her face   Blood transfusion without reported diagnosis 2022   Cataract    bilateral sx   Cellulitis of left leg 07/28/2014   hospitalized for 4 days   Chronic kidney disease    COPD (chronic obstructive pulmonary disease) (HCC)    Dysrhythmia    one episode of a-fib no other issues   Emphysema of lung (HCC)    Esophageal web    GERD  (gastroesophageal reflux disease)    History of granulomatous disease    History of hiatal hernia    Hyperlipidemia    on meds   Hypertension    on meds   Iron deficiency anemia    Left leg swelling    Paroxysmal atrial fibrillation (HCC)    Pneumonia    2022   Right-sided low back pain with sciatica    Seasonal allergies    Sepsis (HCC) 08/17/2014    Past Surgical History:  Procedure Laterality Date   ABDOMINAL HYSTERECTOMY  2000   ANKLE SURGERY Left 1996   APPENDECTOMY  1978   BALLOON DILATION N/A 07/29/2021   Procedure: BALLOON DILATION;  Surgeon: Iva Boop, MD;  Location: [facility];  Service: Endoscopy;  Laterality: N/A;   BIOPSY  07/29/2021   Procedure: BIOPSY;  Surgeon: Iva Boop, MD;  Location: [facility];  Service: Endoscopy;;   CATARACT EXTRACTION Bilateral 2013   COLONOSCOPY WITH PROPOFOL N/A 12/30/2022   Procedure: COLONOSCOPY WITH PROPOFOL;  Surgeon: Iva Boop, MD;  Location: [facility];  Service: Gastroenterology;  Laterality: N/A;   ESOPHAGOGASTRODUODENOSCOPY (EGD) WITH PROPOFOL N/A 07/29/2021   Procedure: ESOPHAGOGASTRODUODENOSCOPY (EGD) WITH PROPOFOL;  Surgeon: Iva Boop, MD;  Location: [facility];  Service: Endoscopy;  Laterality: N/A;   TOTAL KNEE ARTHROPLASTY Right 2008   TOTAL KNEE ARTHROPLASTY Left 2012     reports that she quit smoking about 44 years ago. Her smoking use included cigarettes. She started smoking about 74 years ago. She has a 60 pack-year smoking history. She has never used smokeless tobacco. She reports current alcohol use of about 2.0 - 3.0 standard drinks of alcohol per week. She reports that she does not use drugs.  Family History  Problem Relation Age of Onset   Alcohol abuse Father    Heart disease Father    Hypertension Father    Hypertension Daughter    Hypertension Son    Asthma Son    Colon polyps Neg Hx    Colon cancer Neg Hx    Esophageal cancer Neg Hx    Rectal cancer Neg Hx    Stomach cancer  Neg Hx      Physical Exam: Vitals:   07/22/23 2300 07/22/23 2315 07/23/23 0028 07/23/23 0508  BP: 139/68 132/70 (!) 153/71 131/69  Pulse:  81 99 83  Resp: (!) 21 13 19 18   Temp:   98.3 F (36.8 C) 98.3 F (36.8 C)  TempSrc:    Oral  SpO2:  95% (!) 89% 96%  Weight:      Height:   5' (1.524 m)  Gen: Awake, alert, NAD   CV: Regular, normal S1, S2, no murmurs  Resp: Normal WOB, nonproductive cough on exam, diminished air movement with minimal expiratory wheeze.   Abd: Flat, normoactive, nontender MSK: Symmetric, no edema  Skin: No rashes or lesions to exposed skin  Neuro: Alert and interactive  Psych: euthymic, appropriate    Data review:   Labs reviewed, notable for:   K5.2, creatinine 1.3 baseline BNP 288 High-sensitivity troponin 8 -> 10 Hemoglobin 8.8, platelet 115 near previous  Micro:  Results for orders placed or performed during the hospital encounter of 07/22/23  Resp panel by RT-PCR (RSV, Flu A&B, Covid) Anterior Nasal Swab     Status: None   Collection Time: 07/22/23  7:27 PM   Specimen: Anterior Nasal Swab  Result Value Ref Range Status   SARS Coronavirus 2 by RT PCR NEGATIVE NEGATIVE Final    Comment: (NOTE) SARS-CoV-2 target nucleic acids are NOT DETECTED.  The SARS-CoV-2 RNA is generally detectable in upper respiratory specimens during the acute phase of infection. The lowest concentration of SARS-CoV-2 viral copies this assay can detect is 138 copies/mL. A negative result does not preclude SARS-Cov-2 infection and should not be used as the sole basis for treatment or other patient management decisions. A negative result may occur with  improper specimen collection/handling, submission of specimen other than nasopharyngeal swab, presence of viral mutation(s) within the areas targeted by this assay, and inadequate number of viral copies(<138 copies/mL). A negative result must be combined with clinical observations, patient history, and  epidemiological information. The expected result is Negative.  Fact Sheet for Patients:  BloggerCourse.com  Fact Sheet for Healthcare Providers:  SeriousBroker.it  This test is no t yet approved or cleared by the [place] FDA and  has been authorized for detection and/or diagnosis of SARS-CoV-2 by FDA under an Emergency Use Authorization (EUA). This EUA will remain  in effect (meaning this test can be used) for the duration of the COVID-19 declaration under Section 564(b)(1) of the Act, 21 U.S.C.section 360bbb-3(b)(1), unless the authorization is terminated  or revoked sooner.       Influenza A by PCR NEGATIVE NEGATIVE Final   Influenza B by PCR NEGATIVE NEGATIVE Final    Comment: (NOTE) The Xpert Xpress SARS-CoV-2/FLU/RSV plus assay is intended as an aid in the diagnosis of influenza from Nasopharyngeal swab specimens and should not be used as a sole basis for treatment. Nasal washings and aspirates are unacceptable for Xpert Xpress SARS-CoV-2/FLU/RSV testing.  Fact Sheet for Patients: BloggerCourse.com  Fact Sheet for Healthcare Providers: SeriousBroker.it  This test is not yet approved or cleared by the [place] FDA and has been authorized for detection and/or diagnosis of SARS-CoV-2 by FDA under an Emergency Use Authorization (EUA). This EUA will remain in effect (meaning this test can be used) for the duration of the COVID-19 declaration under Section 564(b)(1) of the Act, 21 U.S.C. section 360bbb-3(b)(1), unless the authorization is terminated or revoked.     Resp Syncytial Virus by PCR NEGATIVE NEGATIVE Final    Comment: (NOTE) Fact Sheet for Patients: BloggerCourse.com  Fact Sheet for Healthcare Providers: SeriousBroker.it  This test is not yet approved or cleared by the [place] FDA and has been  authorized for detection and/or diagnosis of SARS-CoV-2 by FDA under an Emergency Use Authorization (EUA). This EUA will remain in effect (meaning this test can be used) for the duration of the COVID-19 declaration under Section 564(b)(1) of the Act, 21 U.S.C. section 360bbb-3(b)(1),  unless the authorization is terminated or revoked.  Performed at [facility], [address] [place], [place] [address]   Respiratory (~20 pathogens) panel by PCR     Status: Abnormal   Collection Time: 07/23/23  1:55 AM   Specimen: Nasopharyngeal Swab; Respiratory  Result Value Ref Range Status   Adenovirus NOT DETECTED NOT DETECTED Final   Coronavirus 229E NOT DETECTED NOT DETECTED Final    Comment: (NOTE) The Coronavirus on the Respiratory Panel, DOES NOT test for the novel  Coronavirus (2019 nCoV)    Coronavirus HKU1 NOT DETECTED NOT DETECTED Final   Coronavirus NL63 NOT DETECTED NOT DETECTED Final   Coronavirus OC43 NOT DETECTED NOT DETECTED Final   Metapneumovirus NOT DETECTED NOT DETECTED Final   Rhinovirus / Enterovirus NOT DETECTED NOT DETECTED Final   Influenza A NOT DETECTED NOT DETECTED Final   Influenza B NOT DETECTED NOT DETECTED Final   Parainfluenza Virus 1 NOT DETECTED NOT DETECTED Final   Parainfluenza Virus 2 NOT DETECTED NOT DETECTED Final   Parainfluenza Virus 3 DETECTED (A) NOT DETECTED Final   Parainfluenza Virus 4 NOT DETECTED NOT DETECTED Final   Respiratory Syncytial Virus NOT DETECTED NOT DETECTED Final   Bordetella pertussis NOT DETECTED NOT DETECTED Final   Bordetella Parapertussis NOT DETECTED NOT DETECTED Final   Chlamydophila pneumoniae NOT DETECTED NOT DETECTED Final   Mycoplasma pneumoniae NOT DETECTED NOT DETECTED Final    Comment: Performed at [facility], [address] [place], [place] [address]    Imaging reviewed:  DG Chest Portable 1 View Result Date: 07/22/2023 CLINICAL DATA:  SOB.  Prior granulomatous disease. EXAM: PORTABLE CHEST 1  VIEW COMPARISON:  Chest x-ray 07/04/2023, CT chest 09/02/2019 FINDINGS: The heart and mediastinal contours are unchanged. Moderate volume hiatal hernia. Atherosclerotic plaque Persistent diffuse punctate nodular calcifications consistent with known prior granulomatous disease. No focal consolidation. No pulmonary edema. No pleural effusion. No pneumothorax. No acute osseous abnormality. IMPRESSION: 1. No active disease in a patient with chronic granulomatous disease. 2.  Moderate volume hiatal hernia. 3.  Aortic Atherosclerosis (ICD10-I70.0). Electronically Signed   By: Tish Frederickson M.D.   On: 07/22/2023 20:16    ED Course:  Desaturation to a low of 84% while on 2 L currently requiring 3 L O2 at rest.  Treated with methylprednisolone, nebs.  Transferred from [facility] ED due to COPD exacerbation/acute on chronic hypoxic respiratory failure further treatment.   Assessment/Plan:  89 y.o. 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  COPD exacerbation Acute on chronic hypoxic respiratory failure, history CHRF on 1-2 L O2 nocturnal Hx recent influenza A on 3/8. Reports persistent hypoxia since this time now requiring 3L O2 around the clock but still with hypoxic sat at home. ~4-5 days worsening SOB and cough. Currently requiring 3L O2. Exam with poor air movement. COVID / Flu / RSV neg. CXR with chronic granulomatous disease, but no acute infiltrates.  -s/p methylprednisolone in ED, Continue prednisone 40 mg daily for 5 days -Ceftriaxone 1 g IV daily x 5 days -Check RVP, sputum culture - Continue home Anoro Ellipta, Singulair, DuoNebs every 6 hours scheduled, albuterol every 4 hours as needed, incentive spirometer, flutter valve, encourage out of bed to chair. -Home O2 evaluation prior to discharge, may  need alternative concentrator / tank reports  her home only goes to 3L.  -Consider referral to pulmonary rehab  Hypokalemia, mild  K 5.2 without AKI.  -Low K diet, repeat BMP in a.m. -Hold her home losartan  Chronic medical problems:  Paroxysmal A-fib: Not on anticoagulation, continue home metoprolol CKD stage IIIb creatinine near baseline at 1.3 HTN: Continue home amlodipine, hydralazine.  Hold losartan with hyperkalemia HLD: Continue home atorvastatin Chronic anemia, thrombocytopenia: Stable from previous RLS: Continue home pramipexole  Body mass index is 22.46 kg/m.    DVT prophylaxis:  Lovenox Code Status:  DNR/DNI(Do NOT Intubate) Diet:  Diet Orders (From admission, onward)     Start     Ordered   07/23/23 0157  Diet 2 gram sodium Room service appropriate? Yes; Fluid consistency: Thin  Diet effective now       Comments: Low potassium  Question Answer Comment  Room service appropriate? Yes   Fluid consistency: Thin      07/23/23 0156           Family Communication:  No   Consults:  none   Admission status:   Inpatient, Telemetry bed  Severity of Illness: The appropriate patient status for this patient is INPATIENT. Inpatient status is judged to be reasonable and necessary in order to provide the required intensity of service to ensure the patient's safety. The patient's presenting symptoms, physical exam findings, and initial radiographic and laboratory data in the context of their chronic comorbidities is felt to place them at high risk for further clinical deterioration. Furthermore, it is not anticipated that the patient will be medically stable for discharge from the hospital within 2 midnights of admission.   * I certify that at the point of admission it is my clinical judgment that the patient will require inpatient hospital care spanning beyond 2 midnights from the point of admission due to high intensity of service, high risk for further deterioration and high  frequency of surveillance required.*   Dolly Rias, MD [facility]  How to contact the [facility] Attending or Consulting provider 7A - 7P or covering provider during after hours 7P -7A, for this patient.  Check the care team in [facility] and look for a) attending/consulting [facility] provider listed and b) the [facility] team listed Log into [email or web address] and use [facility]'s universal password to access. If you do not have the password, please contact the hospital operator. Locate the [facility] provider you are looking for under [facility] and page to a number that you can be directly reached. If you still have difficulty reaching the provider, please page the [facility] (Director on Call) for the Hospitalists listed on amion for assistance.  07/23/2023, 5:36 AM

## 2023-07-23 NOTE — Evaluation (Signed)
 Physical Therapy Evaluation and Discharge Patient Details Name: Judith Boone MRN: [id number] DOB: 08-01-1931 Today's Date: 07/23/2023  History of Present Illness  89 y/o F presents to [facility] on 3/27 with complaint of SOB. Recently diagnosed with influenza A on 3/8 and since then she has unrelenting cough and difficult to clear secretions. A&P suggest COPD exacerbation (acute on chronic hypoxic respiratory failure, history CHRF on 1-2 L O2 nocturnal. PMH includes:  hx of COPD, paroxysmal A-fib not on anticoagulation, CKD 3B, hypertension, anemia, thrombocytopenia, sepsis.  Clinical Impression  Patient lives at a local ILF and is independent with ADLs/IADLs. Patient is ModI for bed mobility and transfers, demonstrating good technique at an increased time. Patient required CGA to ensure safety when ambulating 114ft around the unit with a rollator. Therapy trialed the patient on 2L of SPO2 when ambulating around the unit. Overall, the patient tolerated well to activity and no further acute PT needs recommended at this time. Thank you for this consult.   O2: On RA seated EOB (Prior to mobility): 88% O2: On 2L of SPO2 (when ambulating): 90-92%     If plan is discharge home, recommend the following: A little help with walking and/or transfers;Assist for transportation;Help with stairs or ramp for entrance   Can travel by private vehicle        Equipment Recommendations None recommended by PT  Recommendations for Other Services       Functional Status Assessment Patient has had a recent decline in their functional status and demonstrates the ability to make significant improvements in function in a reasonable and predictable amount of time.     Precautions / Restrictions Precautions Precautions: Fall Recall of Precautions/Restrictions: Intact Precaution/Restrictions Comments: 3L of O2 on arrival, 2L of O2 at end of session Restrictions Weight Bearing Restrictions Per Provider Order: No       Mobility  Bed Mobility Overal bed mobility: Modified Independent             General bed mobility comments: Transitions EOB with increased time    Transfers Overall transfer level: Modified independent Equipment used: Rollator (4 wheels)                    Ambulation/Gait Ambulation/Gait assistance: Contact guard assist Gait Distance (Feet): 150 Feet Assistive device: Rollator (4 wheels) Gait Pattern/deviations: Step-through pattern, Trunk flexed Gait velocity: Decreased        Stairs            Wheelchair Mobility     Tilt Bed    Modified Rankin (Stroke Patients Only)       Balance Overall balance assessment: Mild deficits observed, not formally tested                                           Pertinent Vitals/Pain Pain Assessment Pain Assessment: No/denies pain    Home Living Family/patient expects to be discharged to:: Other (Comment)                        Prior Function Prior Level of Function : Independent/Modified Independent;Driving             Mobility Comments: 4 WRW in the halls/community. ADLs Comments: Mod I for ADL.  Has reacher and sock aid.     Extremity/Trunk Assessment   Upper Extremity Assessment Upper Extremity Assessment: Defer to  OT evaluation    Lower Extremity Assessment Lower Extremity Assessment: Overall WFL for tasks assessed    Cervical / Trunk Assessment Cervical / Trunk Assessment: Kyphotic  Communication   Communication Communication: No apparent difficulties Factors Affecting Communication: Hearing impaired    Cognition Arousal: Alert Behavior During Therapy: WFL for tasks assessed/performed                             Following commands: Intact       Cueing Cueing Techniques: Verbal cues     General Comments      Exercises     Assessment/Plan    PT Assessment Patient does not need any further PT services  PT Problem List Decreased  activity tolerance;Decreased balance;Decreased mobility       PT Treatment Interventions      PT Goals (Current goals can be found in the Care Plan section)  Acute Rehab PT Goals Patient Stated Goal: Feel Better PT Goal Formulation: With patient Time For Goal Achievement: 08/06/23 Potential to Achieve Goals: Good    Frequency       Co-evaluation               AM-PAC PT "6 Clicks" Mobility  Outcome Measure Help needed turning from your back to your side while in a flat bed without using bedrails?: None Help needed moving from lying on your back to sitting on the side of a flat bed without using bedrails?: None Help needed moving to and from a bed to a chair (including a wheelchair)?: None Help needed standing up from a chair using your arms (e.g., wheelchair or bedside chair)?: None Help needed to walk in hospital room?: A Little Help needed climbing 3-5 steps with a railing? : A Little 6 Click Score: 22    End of Session Equipment Utilized During Treatment: Gait belt;Oxygen Activity Tolerance: Patient tolerated treatment well Patient left: in bed;with call bell/phone within reach;with bed alarm set Nurse Communication: Mobility status;Other (comment) (Oxygen) PT Visit Diagnosis: Unsteadiness on feet (R26.81)    Time: 2841-3244 PT Time Calculation (min) (ACUTE ONLY): 16 min   Charges:   PT Evaluation $PT Eval Low Complexity: 1 Low   PT General Charges $$ ACUTE PT VISIT: 1 Visit        Doreen Beam, SPT   Tasean Mancha 07/23/2023, 4:12 PM

## 2023-07-24 DIAGNOSIS — J441 Chronic obstructive pulmonary disease with (acute) exacerbation: Secondary | ICD-10-CM | POA: Diagnosis not present

## 2023-07-24 NOTE — Progress Notes (Signed)
 Mobility Specialist Progress Note:    07/24/23 1211  Therapy Vitals  Temp (!) 97.5 F (36.4 C)  Temp Source Oral  Pulse Rate 75  Resp 17  BP (!) 140/59  Patient Position (if appropriate) Sitting  Oxygen Therapy  SpO2 100 %  O2 Device Nasal Cannula  O2 Flow Rate (L/min) 3 L/min  Mobility  Activity Ambulated with assistance in hallway  Level of Assistance Contact guard assist, steadying assist  Assistive Device Front wheel walker  Distance Ambulated (ft) 150 ft  Activity Response Tolerated well  Mobility Referral Yes  Mobility visit 1 Mobility  Mobility Specialist Start Time (ACUTE ONLY) 1200  Mobility Specialist Stop Time (ACUTE ONLY) 1208  Mobility Specialist Time Calculation (min) (ACUTE ONLY) 8 min   Received pt in chair having no complaints and agreeable to mobility. VSS on 3L O2. Pt was asymptomatic throughout ambulation and returned to room w/o fault. Left in chair w/ call bell in reach and all needs met. NT present.   D'Vante Earlene Plater Mobility Specialist Please contact via Special educational needs teacher or Rehab office at [phone]

## 2023-07-24 NOTE — Progress Notes (Signed)
 Mobility Specialist Progress Note:    07/24/23 1400  Oxygen Therapy  O2 Device Nasal Cannula  O2 Flow Rate (L/min) 3 L/min  Mobility  Activity Ambulated with assistance in hallway  Level of Assistance Contact guard assist, steadying assist  Assistive Device Four wheel walker  Distance Ambulated (ft) 275 ft  Activity Response Tolerated well  Mobility Referral Yes  Mobility visit 1 Mobility  Mobility Specialist Start Time (ACUTE ONLY) 1413  Mobility Specialist Stop Time (ACUTE ONLY) 1429  Mobility Specialist Time Calculation (min) (ACUTE ONLY) 16 min   Pt received in chair, eager for mobility. Asymptomatic w/ no complaints throughout. VSS on 3L O2. Successful void in BR at EOS. Pt left in chair with call bell and all needs met.  D'Vante Earlene Plater Mobility Specialist Please contact via Special educational needs teacher or Rehab office at [phone]

## 2023-07-24 NOTE — TOC Initial Note (Signed)
 Transition of Care [facility]) - Initial/Assessment Note    Patient Details  Name: Judith Boone MRN: [id number] Date of Birth: [birth year]  Transition of Care [facility]) CM/SW Contact:    Lorri Frederick, LCSW Phone Number: 07/24/2023, 9:33 AM  Clinical Narrative:    CSW met with pt for initial assessment.  Pt is from [place] independent living, no current services there, does plan to return at DC.  Permission given to speak with son Molly Maduro, daughter [place].  No TOC needs identified at this time.               Expected Discharge Plan: Home/Self Care Barriers to Discharge: Continued Medical Work up   Patient Goals and CMS Choice Patient states their goals for this hospitalization and ongoing recovery are:: back to normal   Choice offered to / list presented to : Patient (return to [place])      Expected Discharge Plan and Services In-house Referral: Clinical Social Work   Post Acute Care Choice: NA Living arrangements for the past 2 months: Independent Living Facility [facility])                                      Prior Living Arrangements/Services Living arrangements for the past 2 months: Independent Living Facility ([facility]) Lives with:: Self Patient language and need for interpreter reviewed:: Yes Do you feel safe going back to the place where you live?: Yes      Need for Family Participation in Patient Care: Yes (Comment) Care giver support system in place?: Yes (comment) Current home services: Other (comment) (none) Criminal Activity/Legal Involvement Pertinent to Current Situation/Hospitalization: No - Comment as needed  Activities of Daily Living   ADL Screening (condition at time of admission) Independently performs ADLs?: Yes (appropriate for developmental age) Is the patient deaf or have difficulty hearing?: Yes Does the patient have difficulty seeing, even when wearing glasses/contacts?: No Does the patient have difficulty  concentrating, remembering, or making decisions?: No  Permission Sought/Granted Permission sought to share information with : Family Supports Permission granted to share information with : Yes, Verbal Permission Granted  Share Information with NAME: son Molly Maduro, daughter [place]           Emotional Assessment Appearance:: Appears stated age Attitude/Demeanor/Rapport: Engaged Affect (typically observed): Appropriate, Pleasant Orientation: : Oriented to Self, Oriented to Place, Oriented to  Time, Oriented to Situation      Admission diagnosis:  COPD exacerbation (HCC) [J44.1] COPD with acute exacerbation (HCC) [J44.1] Patient Active Problem List   Diagnosis Date Noted   Acute on chronic hypoxic respiratory failure (HCC) 07/23/2023   Hyperkalemia 07/23/2023   COPD with acute exacerbation (HCC) 07/22/2023   Heme + stool 12/30/2022   Pancreas cyst 06/17/2022   Esophageal dysphagia    On home oxygen therapy 03/13/2021   Symptomatic anemia 02/15/2021   Hypercoagulable state due to paroxysmal atrial fibrillation (HCC) 03/31/2019   Pneumonia 02/03/2019   AKI (acute kidney injury) (HCC) 02/03/2019   Paroxysmal atrial fibrillation (HCC) 02/03/2019   CKD (chronic kidney disease) stage 3, GFR 30-59 ml/min (HCC) 08/17/2014   Cellulitis of leg, left 08/16/2014   Pain of great toe 03/23/2012   Leg swelling 03/23/2012   Decreased hearing 12/23/2011   Renal insufficiency 09/27/2011   Insomnia 05/16/2011   Radicular leg pain 05/16/2011   Other fatigue 12/12/2010   Hearing loss 12/12/2010   HTN (hypertension)  07/02/2010   Hypercholesteremia 07/02/2010   Arthritis 07/02/2010   GERD (gastroesophageal reflux disease) 07/02/2010   Esophageal web 07/02/2010   PAD (peripheral artery disease) (HCC) 07/02/2010   PCP:  [name], MD Pharmacy:   [facility] [address], [place] - [address] [place] [address] Phone: [phone] Fax:  [phone]     Social Drivers of Health (SDOH) Social History: SDOH Screenings   Food Insecurity: No Food Insecurity (07/23/2023)  Housing: Low Risk  (07/23/2023)  Transportation Needs: No Transportation Needs (07/23/2023)  Utilities: Not At Risk (07/23/2023)  Alcohol Screen: Low Risk  (10/18/2022)  Depression (PHQ2-9): Low Risk  (10/18/2022)  Social Connections: Moderately Integrated (07/23/2023)  Tobacco Use: Medium Risk (07/22/2023)   SDOH Interventions:     Readmission Risk Interventions     No data to display

## 2023-07-24 NOTE — Progress Notes (Signed)
 PROGRESS NOTE    Judith Boone  ZOX:[id number] DOB: August 06, 1931 DOA: 07/22/2023 PCP: [name], MD    Brief Narrative:  89 year old with history of COPD using 1 to 2 L of oxygen at home, recently diagnosed with influenza A and had continuous unrelenting cough and wheezing so came to the emergency room.  In the emergency room chest x-ray with mostly chronic changes.  On 3 L of oxygen.  Symptomatic with cough and wheezing.  Admitted for COPD exacerbation.  Clinically improving now.  Subjective: Patient seen and examined.  Still has cough and wheezing but she thinks it is better than yesterday.  She would like to go back to her apartment as soon as possible.  She thinks she will be ready by tomorrow.  Remains afebrile. Assessment & Plan:    COPD with acute exacerbation: COVID, flu and RSV negative.  Extended viral panel positive for parainfluenza infection. Continue bronchodilator therapy, oral steroids, inhalational steroids and breathing exercises along with mucolytic's.   Continue Rocephin today.  Will continue antibiotics for 5 days. Keep on oxygen to keep saturation more than 90%.  She has oxygen concentrator at home that delivers up to 3 L.  If she needs more than that, she will need a new device.  Chronic medical issues including Paroxysmal A-fib, rate controlled on metoprolol. CKD stage IIIb, at about baseline. Essential hypertension, on amlodipine and hydralazine.  Stable. Hyperlipidemia, on statin Chronic anemia and thrombocytopenia, stable Restless leg syndrome, on pramipexole continued.   DVT prophylaxis: enoxaparin (LOVENOX) injection 30 mg Start: 07/23/23 1000   Code Status: DNR with limited intervention Family Communication: None at the bedside Disposition Plan: Status is: Inpatient Remains inpatient appropriate because: Significant cough and wheezing     Consultants:  None  Procedures:  None  Antimicrobials:  Rocephin 3/26---     Objective: Vitals:    07/24/23 0300 07/24/23 0451 07/24/23 0800 07/24/23 1211  BP:  (!) 124/52 (!) 130/51 (!) 140/59  Pulse:  89 81 75  Resp:    17  Temp:  97.7 F (36.5 C) 97.7 F (36.5 C) (!) 97.5 F (36.4 C)  TempSrc:  Oral Oral Oral  SpO2: 94% 92% 98% 100%  Weight:      Height:        Intake/Output Summary (Last 24 hours) at 07/24/2023 1243 Last data filed at 07/23/2023 1500 Gross per 24 hour  Intake 120 ml  Output --  Net 120 ml   Filed Weights   07/22/23 1833  Weight: 52.2 kg    Examination:  General exam: Appears calm and comfortable.  Frail.  Age-appropriate. Respiratory system: Clear to auscultation. Respiratory effort normal.  Mostly upper airway sounds. Cardiovascular system: S1 & S2 heard, RRR. No pedal edema. Gastrointestinal system: Soft.  Nontender.  Bowel sound present.   Central nervous system: Alert and oriented. No focal neurological deficits.  Gross generalized weakness.  Walking with walker.    Data Reviewed: I have personally reviewed following labs and imaging studies  CBC: Recent Labs  Lab 07/22/23 1926 07/23/23 0633  WBC 4.7 3.1*  NEUTROABS 3.1  --   HGB 8.8* 9.1*  HCT 27.7* 28.1*  MCV 78.7* 78.7*  PLT 115* 112*   Basic Metabolic Panel: Recent Labs  Lab 07/22/23 1926 07/23/23 0633  NA 134* 136  K 5.2* 5.1  CL 102 105  CO2 22 16*  GLUCOSE 93 135*  BUN 27* 24*  CREATININE 1.30* 1.31*  CALCIUM 8.3* 8.3*  MG  --  1.7  PHOS  --  4.8*   GFR: Estimated Creatinine Clearance: 20.9 mL/min (A) (by C-G formula based on SCr of 1.31 mg/dL (H)). Liver Function Tests: Recent Labs  Lab 07/22/23 1926  AST 25  ALT 18  ALKPHOS 105  BILITOT 0.8  PROT 6.8  ALBUMIN 3.0*   Recent Labs  Lab 07/22/23 1926  LIPASE 26   No results for input(s): "AMMONIA" in the last 168 hours. Coagulation Profile: No results for input(s): "INR", "PROTIME" in the last 168 hours. Cardiac Enzymes: No results for input(s): "CKTOTAL", "CKMB", "CKMBINDEX", "TROPONINI" in the  last 168 hours. BNP (last 3 results) No results for input(s): "PROBNP" in the last 8760 hours. HbA1C: No results for input(s): "HGBA1C" in the last 72 hours. CBG: No results for input(s): "GLUCAP" in the last 168 hours. Lipid Profile: No results for input(s): "CHOL", "HDL", "LDLCALC", "TRIG", "CHOLHDL", "LDLDIRECT" in the last 72 hours. Thyroid Function Tests: No results for input(s): "TSH", "T4TOTAL", "FREET4", "T3FREE", "THYROIDAB" in the last 72 hours. Anemia Panel: No results for input(s): "VITAMINB12", "FOLATE", "FERRITIN", "TIBC", "IRON", "RETICCTPCT" in the last 72 hours. Sepsis Labs: No results for input(s): "PROCALCITON", "LATICACIDVEN" in the last 168 hours.  Recent Results (from the past 240 hours)  Resp panel by RT-PCR (RSV, Flu A&B, Covid) Anterior Nasal Swab     Status: None   Collection Time: 07/22/23  7:27 PM   Specimen: Anterior Nasal Swab  Result Value Ref Range Status   SARS Coronavirus 2 by RT PCR NEGATIVE NEGATIVE Final    Comment: (NOTE) SARS-CoV-2 target nucleic acids are NOT DETECTED.  The SARS-CoV-2 RNA is generally detectable in upper respiratory specimens during the acute phase of infection. The lowest concentration of SARS-CoV-2 viral copies this assay can detect is 138 copies/mL. A negative result does not preclude SARS-Cov-2 infection and should not be used as the sole basis for treatment or other patient management decisions. A negative result may occur with  improper specimen collection/handling, submission of specimen other than nasopharyngeal swab, presence of viral mutation(s) within the areas targeted by this assay, and inadequate number of viral copies(<138 copies/mL). A negative result must be combined with clinical observations, patient history, and epidemiological information. The expected result is Negative.  Fact Sheet for Patients:  BloggerCourse.com  Fact Sheet for Healthcare Providers:   SeriousBroker.it  This test is no t yet approved or cleared by the [place] FDA and  has been authorized for detection and/or diagnosis of SARS-CoV-2 by FDA under an Emergency Use Authorization (EUA). This EUA will remain  in effect (meaning this test can be used) for the duration of the COVID-19 declaration under Section 564(b)(1) of the Act, 21 U.S.C.section 360bbb-3(b)(1), unless the authorization is terminated  or revoked sooner.       Influenza A by PCR NEGATIVE NEGATIVE Final   Influenza B by PCR NEGATIVE NEGATIVE Final    Comment: (NOTE) The Xpert Xpress SARS-CoV-2/FLU/RSV plus assay is intended as an aid in the diagnosis of influenza from Nasopharyngeal swab specimens and should not be used as a sole basis for treatment. Nasal washings and aspirates are unacceptable for Xpert Xpress SARS-CoV-2/FLU/RSV testing.  Fact Sheet for Patients: BloggerCourse.com  Fact Sheet for Healthcare Providers: SeriousBroker.it  This test is not yet approved or cleared by the [place] FDA and has been authorized for detection and/or diagnosis of SARS-CoV-2 by FDA under an Emergency Use Authorization (EUA). This EUA will remain in effect (meaning this test can be used) for the duration  of the COVID-19 declaration under Section 564(b)(1) of the Act, 21 U.S.C. section 360bbb-3(b)(1), unless the authorization is terminated or revoked.     Resp Syncytial Virus by PCR NEGATIVE NEGATIVE Final    Comment: (NOTE) Fact Sheet for Patients: BloggerCourse.com  Fact Sheet for Healthcare Providers: SeriousBroker.it  This test is not yet approved or cleared by the [place] FDA and has been authorized for detection and/or diagnosis of SARS-CoV-2 by FDA under an Emergency Use Authorization (EUA). This EUA will remain in effect (meaning this test can be used) for  the duration of the COVID-19 declaration under Section 564(b)(1) of the Act, 21 U.S.C. section 360bbb-3(b)(1), unless the authorization is terminated or revoked.  Performed at [facility], [address] [place], [place] [address]   Expectorated Sputum Assessment w Gram Stain, Rflx to Resp Cult     Status: None   Collection Time: 07/23/23  1:55 AM   Specimen: Expectorated Sputum  Result Value Ref Range Status   Specimen Description EXPECTORATED SPUTUM  Final   Special Requests NONE  Final   Sputum evaluation   Final    Sputum specimen not acceptable for testing.  Please recollect.   Gram Stain Report Called to,Read Back By and Verified With: RN [email or web address] AT 1444 ON 07/23/2023 BY T.SAAD. Performed at [facility], [address] [place], [place] [address]    Report Status 07/23/2023 FINAL  Final  Respiratory (~20 pathogens) panel by PCR     Status: Abnormal   Collection Time: 07/23/23  1:55 AM   Specimen: Nasopharyngeal Swab; Respiratory  Result Value Ref Range Status   Adenovirus NOT DETECTED NOT DETECTED Final   Coronavirus 229E NOT DETECTED NOT DETECTED Final    Comment: (NOTE) The Coronavirus on the Respiratory Panel, DOES NOT test for the novel  Coronavirus (2019 nCoV)    Coronavirus HKU1 NOT DETECTED NOT DETECTED Final   Coronavirus NL63 NOT DETECTED NOT DETECTED Final   Coronavirus OC43 NOT DETECTED NOT DETECTED Final   Metapneumovirus NOT DETECTED NOT DETECTED Final   Rhinovirus / Enterovirus NOT DETECTED NOT DETECTED Final   Influenza A NOT DETECTED NOT DETECTED Final   Influenza B NOT DETECTED NOT DETECTED Final   Parainfluenza Virus 1 NOT DETECTED NOT DETECTED Final   Parainfluenza Virus 2 NOT DETECTED NOT DETECTED Final   Parainfluenza Virus 3 DETECTED (A) NOT DETECTED Final   Parainfluenza Virus 4 NOT DETECTED NOT DETECTED Final   Respiratory Syncytial Virus NOT DETECTED NOT DETECTED Final   Bordetella pertussis NOT DETECTED NOT DETECTED Final    Bordetella Parapertussis NOT DETECTED NOT DETECTED Final   Chlamydophila pneumoniae NOT DETECTED NOT DETECTED Final   Mycoplasma pneumoniae NOT DETECTED NOT DETECTED Final    Comment: Performed at [facility], [address], [place], [place] [address]         Radiology Studies: DG Chest Portable 1 View Result Date: 07/22/2023 CLINICAL DATA:  SOB.  Prior granulomatous disease. EXAM: PORTABLE CHEST 1 VIEW COMPARISON:  Chest x-ray 07/04/2023, CT chest 09/02/2019 FINDINGS: The heart and mediastinal contours are unchanged. Moderate volume hiatal hernia. Atherosclerotic plaque Persistent diffuse punctate nodular calcifications consistent with known prior granulomatous disease. No focal consolidation. No pulmonary edema. No pleural effusion. No pneumothorax. No acute osseous abnormality. IMPRESSION: 1. No active disease in a patient with chronic granulomatous disease. 2.  Moderate volume hiatal hernia. 3.  Aortic Atherosclerosis (ICD10-I70.0). Electronically Signed   By: Tish Frederickson M.D.   On:  07/22/2023 20:16        Scheduled Meds:  amLODipine  5 mg Oral QHS   atorvastatin  20 mg Oral QHS   dextromethorphan-guaiFENesin  1 tablet Oral BID   enoxaparin (LOVENOX) injection  30 mg Subcutaneous Daily   hydrALAZINE  25 mg Oral BID   ipratropium-albuterol  3 mL Nebulization Q6H   loratadine  10 mg Oral Daily   metoprolol succinate  25 mg Oral Daily   montelukast  10 mg Oral Daily   pantoprazole  40 mg Oral Daily   pramipexole  0.25 mg Oral BID   predniSONE  40 mg Oral Q breakfast   sodium chloride flush  3 mL Intravenous Q12H   umeclidinium-vilanterol  1 puff Inhalation Daily   Continuous Infusions:  cefTRIAXone (ROCEPHIN)  IV 1 g (07/23/23 2110)     LOS: 1 day    Time spent: 40 minutes    Dorcas Carrow, MD [facility]

## 2023-07-25 DIAGNOSIS — J441 Chronic obstructive pulmonary disease with (acute) exacerbation: Secondary | ICD-10-CM | POA: Diagnosis not present

## 2023-07-25 MED ORDER — DM-GUAIFENESIN ER 30-600 MG PO TB12: 1.0000 | ORAL_TABLET | Freq: Two times a day (BID) | ORAL | 0 refills | Status: AC

## 2023-07-25 MED ORDER — PREDNISONE 20 MG PO TABS: 40.0000 mg | ORAL_TABLET | Freq: Every day | ORAL | 0 refills | Status: AC

## 2023-07-25 MED ORDER — CEFDINIR 300 MG PO CAPS: 300.0000 mg | ORAL_CAPSULE | Freq: Every day | ORAL | 0 refills | Status: AC

## 2023-07-25 NOTE — TOC Transition Note (Signed)
 Transition of Care [facility]) - Discharge Note   Patient Details  Name: Judith Boone MRN: [id number] Date of Birth: 03-Jun-1931  Transition of Care [facility]) CM/SW Contact:  Ronny Bacon, RN Phone Number: 07/25/2023, 9:11 AM   Clinical Narrative:   Patient is being discharged today. Spoke with patient on the phone. She reported that she used [employer] in the past, about 2-3 years ago, and was unhappy with the service. Patient in agreement with using [employer] for home oxygen needs. Home oxygen arranged through [facility] with [employer] to be delivered to bedside before discharge. Patient has a friend to come pick her up.    Final next level of care: Home/Self Care Barriers to Discharge: No Barriers Identified   Patient Goals and CMS Choice Patient states their goals for this hospitalization and ongoing recovery are:: back to normal   Choice offered to / list presented to : Patient (return to [place])      Discharge Placement                       Discharge Plan and Services Additional resources added to the After Visit Summary for   In-house Referral: Clinical Social Work   Post Acute Care Choice: NA          DME Arranged: Oxygen DME Agency: [employer] Date DME Agency Contacted: 07/25/23 Time DME Agency Contacted: [phone] Representative spoke with at DME Agency: Vaughan Basta            Social Drivers of Health (SDOH) Interventions SDOH Screenings   Food Insecurity: No Food Insecurity (07/23/2023)  Housing: Low Risk  (07/23/2023)  Transportation Needs: No Transportation Needs (07/23/2023)  Utilities: Not At Risk (07/23/2023)  Alcohol Screen: Low Risk  (10/18/2022)  Depression (PHQ2-9): Low Risk  (10/18/2022)  Social Connections: Moderately Integrated (07/23/2023)  Tobacco Use: Medium Risk (07/22/2023)     Readmission Risk Interventions     No data to display

## 2023-07-25 NOTE — Discharge Summary (Signed)
 Physician Discharge Summary  Judith Boone:[id number] DOB: [birth year] DOA: 07/22/2023  PCP: [name], MD  Admit date: 07/22/2023 Discharge date: 07/25/2023  Admitted From: Home Disposition: Home  Recommendations for Outpatient Follow-up:  Follow up with PCP in 1-2 weeks   Home Health: N/A Equipment/Devices: Oxygen, available at home  Discharge Condition: Stable CODE STATUS: DNR with limited intervention Diet recommendation: Regular diet, nutritional supplements  Discharge summary: 89 year old with history of COPD using 1 to 2 L of oxygen at home mostly at nights, recently diagnosed with influenza A and had continuous unrelenting cough and wheezing so came to the emergency room.  In the emergency room chest x-ray with mostly chronic changes.  On 3 L of oxygen.  Symptomatic with cough and wheezing.  Admitted for COPD exacerbation.  Patient was treated with bronchodilator therapy, steroids and respiratory therapy with clinical improvement.  Able to go home today.  # COPD with acute exacerbation: COVID, flu and RSV negative.  Extended viral panel positive for parainfluenza infection. Treated with bronchodilator therapy, steroid inhalers and oral steroids.  Also received Rocephin day 2 today.  Received a scheduled mucolytic's.  Patient with good clinical response now. Discharge home to complete 5 days of oral antibiotic therapy. Prednisone 40 mg daily, 2 more days. She already has bronchodilator therapy and steroids inhalers at home. Suggested to continue to use incentive spirometry at home. Oxygen to keep saturation more than 90%.  She will need new oxygen supplies concentrator at home and this was arranged before discharge.   Chronic medical issues including Paroxysmal A-fib, rate controlled on metoprolol. CKD stage IIIb, at about baseline. Essential hypertension, on amlodipine and hydralazine.  Stable. Hyperlipidemia, on statin Chronic anemia and thrombocytopenia,  stable Restless leg syndrome, on pramipexole continued.  Stable to discharge home.     Discharge Diagnoses:  Principal Problem:   COPD with acute exacerbation (HCC) Active Problems:   Acute on chronic hypoxic respiratory failure (HCC)   Hyperkalemia    Discharge Instructions  Discharge Instructions     Diet - low sodium heart healthy   Complete by: As directed    Increase activity slowly   Complete by: As directed       Allergies as of 07/25/2023       Reactions   Bacitracin    Makes wound worse    Neosporin [neomycin-bacitracin Zn-polymyx]    Makes wound worse    Polysporin [bacitracin-polymyxin B]    Makes wound worse         Medication List     STOP taking these medications    losartan 50 MG tablet Commonly known as: COZAAR       TAKE these medications    AeroChamber MV inhaler Use as instructed with Albuterol HFA   amLODipine 5 MG tablet Commonly known as: NORVASC Take 5 mg by mouth at bedtime.   Anoro Ellipta 62.5-25 MCG/INH Aepb Generic drug: umeclidinium-vilanterol Inhale 1 puff into the lungs daily.   atorvastatin 20 MG tablet Commonly known as: LIPITOR Take 20 mg by mouth at bedtime.   CALCIUM + D PO Take 1 tablet by mouth 2 (two) times daily.   cefdinir 300 MG capsule Commonly known as: OMNICEF Take 1 capsule (300 mg total) by mouth daily for 5 days.   cetirizine 10 MG tablet Commonly known as: ZYRTEC Take 10 mg by mouth daily as needed for allergies.   Cholecalciferol 25 MCG (1000 UT) capsule Take 1,000 Units by mouth daily.   dextromethorphan-guaiFENesin 30-600 MG  12hr tablet Commonly known as: MUCINEX DM Take 1 tablet by mouth 2 (two) times daily for 10 days.   ferrous sulfate 325 (65 FE) MG tablet Take 325 mg by mouth daily.   hydrALAZINE 25 MG tablet Commonly known as: APRESOLINE Take 25 mg by mouth 2 (two) times daily.   metoprolol succinate 25 MG 24 hr tablet Commonly known as: TOPROL-XL Take 1 tablet (25  mg total) by mouth daily.   montelukast 10 MG tablet Commonly known as: SINGULAIR Take 10 mg by mouth daily.   multivitamin tablet Take 1 tablet by mouth daily.   omeprazole 40 MG capsule Commonly known as: PRILOSEC Take 40 mg by mouth in the morning and at bedtime.   OXYGEN Inhale into the lungs. As needed   pramipexole 0.25 MG tablet Commonly known as: MIRAPEX Take 0.25 mg by mouth at bedtime.   predniSONE 20 MG tablet Commonly known as: DELTASONE Take 2 tablets (40 mg total) by mouth daily with breakfast for 2 days.   Ventolin HFA 108 (90 Base) MCG/ACT inhaler Generic drug: albuterol Inhale 1-2 puffs into the lungs every 6 (six) hours as needed for wheezing.   VITAMIN C PO Take 1 tablet by mouth daily.               Durable Medical Equipment  (From admission, onward)           Start     Ordered   07/25/23 0857  For home use only DME oxygen  Once       Question Answer Comment  Length of Need Lifetime   Mode or (Route) Nasal cannula   Liters per Minute 3   Frequency Continuous (stationary and portable oxygen unit needed)   Oxygen conserving device Yes   Oxygen delivery system Gas      07/25/23 0856            Allergies  Allergen Reactions   Bacitracin     Makes wound worse    Neosporin [Neomycin-Bacitracin Zn-Polymyx]     Makes wound worse    Polysporin [Bacitracin-Polymyxin B]     Makes wound worse     Consultations: None   Procedures/Studies: DG Chest Portable 1 View Result Date: 07/22/2023 CLINICAL DATA:  SOB.  Prior granulomatous disease. EXAM: PORTABLE CHEST 1 VIEW COMPARISON:  Chest x-ray 07/04/2023, CT chest 09/02/2019 FINDINGS: The heart and mediastinal contours are unchanged. Moderate volume hiatal hernia. Atherosclerotic plaque Persistent diffuse punctate nodular calcifications consistent with known prior granulomatous disease. No focal consolidation. No pulmonary edema. No pleural effusion. No pneumothorax. No acute osseous  abnormality. IMPRESSION: 1. No active disease in a patient with chronic granulomatous disease. 2.  Moderate volume hiatal hernia. 3.  Aortic Atherosclerosis (ICD10-I70.0). Electronically Signed   By: Tish Frederickson M.D.   On: 07/22/2023 20:16   DG Chest 2 View Result Date: 07/04/2023 CLINICAL DATA:  Shortness of breath. EXAM: CHEST - 2 VIEW COMPARISON:  02/15/2021 FINDINGS: Rightward patient rotation. The cardio pericardial silhouette is enlarged. Hyperexpansion suggests emphysema. Interstitial markings are diffusely coarsened with chronic features. Calcified granulomas in the left mid lung again noted with diffuse micro nodularity overlying both lungs as before and compatible with the innumerable granuloma seen on chest CT 09/02/2019. Small to moderate hiatal hernia noted. Bones are diffusely demineralized. Telemetry leads overlie the chest. IMPRESSION: Hyperexpansion consistent with emphysema. Innumerable calcified granulomata in both lungs, as before. Small to moderate hiatal hernia. Electronically Signed   By: Jamison Oka.D.  On: 07/04/2023 12:19   (Echo, Carotid, EGD, Colonoscopy, ERCP)    Subjective: Patient seen and examined.  She tells me she has very occasional cough but mostly improved.  She is eager to go home.  Her friend will pick her up.  Remains afebrile.  Did well moving around.  Patient tells me she wants to be independent as much as possible.   Discharge Exam: Vitals:   07/25/23 0811 07/25/23 0840  BP: (!) 139/58   Pulse: 69 65  Resp: 15 20  Temp: (!) 97.5 F (36.4 C)   SpO2: 98% 99%   Vitals:   07/24/23 2112 07/25/23 0317 07/25/23 0811 07/25/23 0840  BP: (!) 135/59 (!) 148/78 (!) 139/58   Pulse: 78 75 69 65  Resp: 18  15 20   Temp: 97.6 F (36.4 C) 97.6 F (36.4 C) (!) 97.5 F (36.4 C)   TempSrc: Oral Oral Oral   SpO2: 98% 96% 98% 99%  Weight:      Height:        General: Pt is alert, awake, not in acute distress She is frail but is appropriate.   Interactive and pleasant. Cardiovascular: RRR, S1/S2 +, no rubs, no gallops Respiratory: CTA bilaterally, no wheezing, no rhonchi, occasional conducted upper airway sounds.  No wheezing or crackles. Abdominal: Soft, NT, ND, bowel sounds + Extremities: no edema, no cyanosis    The results of significant diagnostics from this hospitalization (including imaging, microbiology, ancillary and laboratory) are listed below for reference.     Microbiology: Recent Results (from the past 240 hours)  Resp panel by RT-PCR (RSV, Flu A&B, Covid) Anterior Nasal Swab     Status: None   Collection Time: 07/22/23  7:27 PM   Specimen: Anterior Nasal Swab  Result Value Ref Range Status   SARS Coronavirus 2 by RT PCR NEGATIVE NEGATIVE Final    Comment: (NOTE) SARS-CoV-2 target nucleic acids are NOT DETECTED.  The SARS-CoV-2 RNA is generally detectable in upper respiratory specimens during the acute phase of infection. The lowest concentration of SARS-CoV-2 viral copies this assay can detect is 138 copies/mL. A negative result does not preclude SARS-Cov-2 infection and should not be used as the sole basis for treatment or other patient management decisions. A negative result may occur with  improper specimen collection/handling, submission of specimen other than nasopharyngeal swab, presence of viral mutation(s) within the areas targeted by this assay, and inadequate number of viral copies(<138 copies/mL). A negative result must be combined with clinical observations, patient history, and epidemiological information. The expected result is Negative.  Fact Sheet for Patients:  BloggerCourse.com  Fact Sheet for Healthcare Providers:  SeriousBroker.it  This test is no t yet approved or cleared by the [place] FDA and  has been authorized for detection and/or diagnosis of SARS-CoV-2 by FDA under an Emergency Use Authorization (EUA). This EUA will  remain  in effect (meaning this test can be used) for the duration of the COVID-19 declaration under Section 564(b)(1) of the Act, 21 U.S.C.section 360bbb-3(b)(1), unless the authorization is terminated  or revoked sooner.       Influenza A by PCR NEGATIVE NEGATIVE Final   Influenza B by PCR NEGATIVE NEGATIVE Final    Comment: (NOTE) The Xpert Xpress SARS-CoV-2/FLU/RSV plus assay is intended as an aid in the diagnosis of influenza from Nasopharyngeal swab specimens and should not be used as a sole basis for treatment. Nasal washings and aspirates are unacceptable for Xpert Xpress SARS-CoV-2/FLU/RSV testing.  Fact Sheet for  Patients: BloggerCourse.com  Fact Sheet for Healthcare Providers: SeriousBroker.it  This test is not yet approved or cleared by the [place] FDA and has been authorized for detection and/or diagnosis of SARS-CoV-2 by FDA under an Emergency Use Authorization (EUA). This EUA will remain in effect (meaning this test can be used) for the duration of the COVID-19 declaration under Section 564(b)(1) of the Act, 21 U.S.C. section 360bbb-3(b)(1), unless the authorization is terminated or revoked.     Resp Syncytial Virus by PCR NEGATIVE NEGATIVE Final    Comment: (NOTE) Fact Sheet for Patients: BloggerCourse.com  Fact Sheet for Healthcare Providers: SeriousBroker.it  This test is not yet approved or cleared by the [place] FDA and has been authorized for detection and/or diagnosis of SARS-CoV-2 by FDA under an Emergency Use Authorization (EUA). This EUA will remain in effect (meaning this test can be used) for the duration of the COVID-19 declaration under Section 564(b)(1) of the Act, 21 U.S.C. section 360bbb-3(b)(1), unless the authorization is terminated or revoked.  Performed at [facility], [address] [place], [place] [address]    Expectorated Sputum Assessment w Gram Stain, Rflx to Resp Cult     Status: None   Collection Time: 07/23/23  1:55 AM   Specimen: Expectorated Sputum  Result Value Ref Range Status   Specimen Description EXPECTORATED SPUTUM  Final   Special Requests NONE  Final   Sputum evaluation   Final    Sputum specimen not acceptable for testing.  Please recollect.   Gram Stain Report Called to,Read Back By and Verified With: RN TINA.B AT 1444 ON 07/23/2023 BY T.SAAD. Performed at [facility], [address] [place], [place] [address]    Report Status 07/23/2023 FINAL  Final  Respiratory (~20 pathogens) panel by PCR     Status: Abnormal   Collection Time: 07/23/23  1:55 AM   Specimen: Nasopharyngeal Swab; Respiratory  Result Value Ref Range Status   Adenovirus NOT DETECTED NOT DETECTED Final   Coronavirus 229E NOT DETECTED NOT DETECTED Final    Comment: (NOTE) The Coronavirus on the Respiratory Panel, DOES NOT test for the novel  Coronavirus (2019 nCoV)    Coronavirus HKU1 NOT DETECTED NOT DETECTED Final   Coronavirus NL63 NOT DETECTED NOT DETECTED Final   Coronavirus OC43 NOT DETECTED NOT DETECTED Final   Metapneumovirus NOT DETECTED NOT DETECTED Final   Rhinovirus / Enterovirus NOT DETECTED NOT DETECTED Final   Influenza A NOT DETECTED NOT DETECTED Final   Influenza B NOT DETECTED NOT DETECTED Final   Parainfluenza Virus 1 NOT DETECTED NOT DETECTED Final   Parainfluenza Virus 2 NOT DETECTED NOT DETECTED Final   Parainfluenza Virus 3 DETECTED (A) NOT DETECTED Final   Parainfluenza Virus 4 NOT DETECTED NOT DETECTED Final   Respiratory Syncytial Virus NOT DETECTED NOT DETECTED Final   Bordetella pertussis NOT DETECTED NOT DETECTED Final   Bordetella Parapertussis NOT DETECTED NOT DETECTED Final   Chlamydophila pneumoniae NOT DETECTED NOT DETECTED Final   Mycoplasma pneumoniae NOT DETECTED NOT DETECTED Final    Comment: Performed at [facility], [address],  [place], [place] [address]     Labs: BNP (last 3 results) Recent Labs    07/04/23 1121 07/22/23 1926  BNP 240.9* 288.7*   Basic Metabolic Panel: Recent Labs  Lab 07/22/23 1926 07/23/23 0633  NA 134* 136  K 5.2* 5.1  CL 102 105  CO2 22 16*  GLUCOSE 93 135*  BUN 27*  24*  CREATININE 1.30* 1.31*  CALCIUM 8.3* 8.3*  MG  --  1.7  PHOS  --  4.8*   Liver Function Tests: Recent Labs  Lab 07/22/23 1926  AST 25  ALT 18  ALKPHOS 105  BILITOT 0.8  PROT 6.8  ALBUMIN 3.0*   Recent Labs  Lab 07/22/23 1926  LIPASE 26   No results for input(s): "AMMONIA" in the last 168 hours. CBC: Recent Labs  Lab 07/22/23 1926 07/23/23 0633  WBC 4.7 3.1*  NEUTROABS 3.1  --   HGB 8.8* 9.1*  HCT 27.7* 28.1*  MCV 78.7* 78.7*  PLT 115* 112*   Cardiac Enzymes: No results for input(s): "CKTOTAL", "CKMB", "CKMBINDEX", "TROPONINI" in the last 168 hours. BNP: Invalid input(s): "POCBNP" CBG: No results for input(s): "GLUCAP" in the last 168 hours. D-Dimer No results for input(s): "DDIMER" in the last 72 hours. Hgb A1c No results for input(s): "HGBA1C" in the last 72 hours. Lipid Profile No results for input(s): "CHOL", "HDL", "LDLCALC", "TRIG", "CHOLHDL", "LDLDIRECT" in the last 72 hours. Thyroid function studies No results for input(s): "TSH", "T4TOTAL", "T3FREE", "THYROIDAB" in the last 72 hours.  Invalid input(s): "FREET3" Anemia work up No results for input(s): "VITAMINB12", "FOLATE", "FERRITIN", "TIBC", "IRON", "RETICCTPCT" in the last 72 hours. Urinalysis    Component Value Date/Time   COLORURINE YELLOW 02/03/2019 1235   APPEARANCEUR CLEAR 02/03/2019 1235   LABSPEC 1.010 02/03/2019 1235   PHURINE 5.5 02/03/2019 1235   GLUCOSEU NEGATIVE 02/03/2019 1235   HGBUR NEGATIVE 02/03/2019 1235   BILIRUBINUR NEGATIVE 02/03/2019 1235   KETONESUR NEGATIVE 02/03/2019 1235   PROTEINUR NEGATIVE 02/03/2019 1235   UROBILINOGEN 0.2 08/17/2014 0000   NITRITE NEGATIVE 02/03/2019 1235    LEUKOCYTESUR NEGATIVE 02/03/2019 1235   Sepsis Labs Recent Labs  Lab 07/22/23 1926 07/23/23 0633  WBC 4.7 3.1*   Microbiology Recent Results (from the past 240 hours)  Resp panel by RT-PCR (RSV, Flu A&B, Covid) Anterior Nasal Swab     Status: None   Collection Time: 07/22/23  7:27 PM   Specimen: Anterior Nasal Swab  Result Value Ref Range Status   SARS Coronavirus 2 by RT PCR NEGATIVE NEGATIVE Final    Comment: (NOTE) SARS-CoV-2 target nucleic acids are NOT DETECTED.  The SARS-CoV-2 RNA is generally detectable in upper respiratory specimens during the acute phase of infection. The lowest concentration of SARS-CoV-2 viral copies this assay can detect is 138 copies/mL. A negative result does not preclude SARS-Cov-2 infection and should not be used as the sole basis for treatment or other patient management decisions. A negative result may occur with  improper specimen collection/handling, submission of specimen other than nasopharyngeal swab, presence of viral mutation(s) within the areas targeted by this assay, and inadequate number of viral copies(<138 copies/mL). A negative result must be combined with clinical observations, patient history, and epidemiological information. The expected result is Negative.  Fact Sheet for Patients:  BloggerCourse.com  Fact Sheet for Healthcare Providers:  SeriousBroker.it  This test is no t yet approved or cleared by the [place] FDA and  has been authorized for detection and/or diagnosis of SARS-CoV-2 by FDA under an Emergency Use Authorization (EUA). This EUA will remain  in effect (meaning this test can be used) for the duration of the COVID-19 declaration under Section 564(b)(1) of the Act, 21 U.S.C.section 360bbb-3(b)(1), unless the authorization is terminated  or revoked sooner.       Influenza A by PCR NEGATIVE NEGATIVE Final   Influenza B by PCR  NEGATIVE NEGATIVE Final     Comment: (NOTE) The Xpert Xpress SARS-CoV-2/FLU/RSV plus assay is intended as an aid in the diagnosis of influenza from Nasopharyngeal swab specimens and should not be used as a sole basis for treatment. Nasal washings and aspirates are unacceptable for Xpert Xpress SARS-CoV-2/FLU/RSV testing.  Fact Sheet for Patients: BloggerCourse.com  Fact Sheet for Healthcare Providers: SeriousBroker.it  This test is not yet approved or cleared by the [place] FDA and has been authorized for detection and/or diagnosis of SARS-CoV-2 by FDA under an Emergency Use Authorization (EUA). This EUA will remain in effect (meaning this test can be used) for the duration of the COVID-19 declaration under Section 564(b)(1) of the Act, 21 U.S.C. section 360bbb-3(b)(1), unless the authorization is terminated or revoked.     Resp Syncytial Virus by PCR NEGATIVE NEGATIVE Final    Comment: (NOTE) Fact Sheet for Patients: BloggerCourse.com  Fact Sheet for Healthcare Providers: SeriousBroker.it  This test is not yet approved or cleared by the [place] FDA and has been authorized for detection and/or diagnosis of SARS-CoV-2 by FDA under an Emergency Use Authorization (EUA). This EUA will remain in effect (meaning this test can be used) for the duration of the COVID-19 declaration under Section 564(b)(1) of the Act, 21 U.S.C. section 360bbb-3(b)(1), unless the authorization is terminated or revoked.  Performed at [facility], [address] [place], [place] [address]   Expectorated Sputum Assessment w Gram Stain, Rflx to Resp Cult     Status: None   Collection Time: 07/23/23  1:55 AM   Specimen: Expectorated Sputum  Result Value Ref Range Status   Specimen Description EXPECTORATED SPUTUM  Final   Special Requests NONE  Final   Sputum evaluation   Final    Sputum specimen not  acceptable for testing.  Please recollect.   Gram Stain Report Called to,Read Back By and Verified With: RN TINA.B AT 1444 ON 07/23/2023 BY T.SAAD. Performed at [facility], [address], [place], [place] [address]    Report Status 07/23/2023 FINAL  Final  Respiratory (~20 pathogens) panel by PCR     Status: Abnormal   Collection Time: 07/23/23  1:55 AM   Specimen: Nasopharyngeal Swab; Respiratory  Result Value Ref Range Status   Adenovirus NOT DETECTED NOT DETECTED Final   Coronavirus 229E NOT DETECTED NOT DETECTED Final    Comment: (NOTE) The Coronavirus on the Respiratory Panel, DOES NOT test for the novel  Coronavirus (2019 nCoV)    Coronavirus HKU1 NOT DETECTED NOT DETECTED Final   Coronavirus NL63 NOT DETECTED NOT DETECTED Final   Coronavirus OC43 NOT DETECTED NOT DETECTED Final   Metapneumovirus NOT DETECTED NOT DETECTED Final   Rhinovirus / Enterovirus NOT DETECTED NOT DETECTED Final   Influenza A NOT DETECTED NOT DETECTED Final   Influenza B NOT DETECTED NOT DETECTED Final   Parainfluenza Virus 1 NOT DETECTED NOT DETECTED Final   Parainfluenza Virus 2 NOT DETECTED NOT DETECTED Final   Parainfluenza Virus 3 DETECTED (A) NOT DETECTED Final   Parainfluenza Virus 4 NOT DETECTED NOT DETECTED Final   Respiratory Syncytial Virus NOT DETECTED NOT DETECTED Final   Bordetella pertussis NOT DETECTED NOT DETECTED Final   Bordetella Parapertussis NOT DETECTED NOT DETECTED Final   Chlamydophila pneumoniae NOT DETECTED NOT DETECTED Final   Mycoplasma pneumoniae NOT DETECTED NOT DETECTED Final    Comment: Performed at [facility], [address] [place], [place] [address]  Time coordinating discharge:  35 minutes  SIGNED:   Dorcas Carrow, MD  [facility] 07/25/2023, 8:58 AM

## 2023-07-25 NOTE — Progress Notes (Signed)
 AVS reviewed with Judith Boone her friend is transporting her home, all personal belongings taken, portable oxygen tank delivered to room, pt placed on portable tank for DC, pt aware to pick up prescriptions at her pharmacy listed. Wheelchair to lobby by Press photographer.

## 2023-07-29 DIAGNOSIS — R293 Abnormal posture: Secondary | ICD-10-CM | POA: Diagnosis not present

## 2023-07-29 DIAGNOSIS — M542 Cervicalgia: Secondary | ICD-10-CM | POA: Diagnosis not present

## 2023-07-30 DIAGNOSIS — J9611 Chronic respiratory failure with hypoxia: Secondary | ICD-10-CM | POA: Diagnosis not present

## 2023-07-30 DIAGNOSIS — I4891 Unspecified atrial fibrillation: Secondary | ICD-10-CM | POA: Diagnosis not present

## 2023-07-30 DIAGNOSIS — J449 Chronic obstructive pulmonary disease, unspecified: Secondary | ICD-10-CM | POA: Diagnosis not present

## 2023-07-30 DIAGNOSIS — Z9981 Dependence on supplemental oxygen: Secondary | ICD-10-CM | POA: Diagnosis not present

## 2023-07-30 DIAGNOSIS — N1832 Chronic kidney disease, stage 3b: Secondary | ICD-10-CM | POA: Diagnosis not present

## 2023-07-30 DIAGNOSIS — E785 Hyperlipidemia, unspecified: Secondary | ICD-10-CM | POA: Diagnosis not present

## 2023-07-30 DIAGNOSIS — E611 Iron deficiency: Secondary | ICD-10-CM | POA: Diagnosis not present

## 2023-07-30 DIAGNOSIS — I129 Hypertensive chronic kidney disease with stage 1 through stage 4 chronic kidney disease, or unspecified chronic kidney disease: Secondary | ICD-10-CM | POA: Diagnosis not present

## 2023-07-30 DIAGNOSIS — D696 Thrombocytopenia, unspecified: Secondary | ICD-10-CM | POA: Diagnosis not present

## 2023-07-30 DIAGNOSIS — J441 Chronic obstructive pulmonary disease with (acute) exacerbation: Secondary | ICD-10-CM | POA: Diagnosis not present

## 2023-07-31 DIAGNOSIS — M542 Cervicalgia: Secondary | ICD-10-CM | POA: Diagnosis not present

## 2023-07-31 DIAGNOSIS — R293 Abnormal posture: Secondary | ICD-10-CM | POA: Diagnosis not present

## 2023-08-06 DIAGNOSIS — R293 Abnormal posture: Secondary | ICD-10-CM | POA: Diagnosis not present

## 2023-08-06 DIAGNOSIS — M542 Cervicalgia: Secondary | ICD-10-CM | POA: Diagnosis not present

## 2023-08-10 DIAGNOSIS — R293 Abnormal posture: Secondary | ICD-10-CM | POA: Diagnosis not present

## 2023-08-10 DIAGNOSIS — M542 Cervicalgia: Secondary | ICD-10-CM | POA: Diagnosis not present

## 2023-08-13 DIAGNOSIS — R293 Abnormal posture: Secondary | ICD-10-CM | POA: Diagnosis not present

## 2023-08-13 DIAGNOSIS — M542 Cervicalgia: Secondary | ICD-10-CM | POA: Diagnosis not present

## 2023-08-17 DIAGNOSIS — M542 Cervicalgia: Secondary | ICD-10-CM | POA: Diagnosis not present

## 2023-08-17 DIAGNOSIS — R293 Abnormal posture: Secondary | ICD-10-CM | POA: Diagnosis not present

## 2023-08-19 DIAGNOSIS — M542 Cervicalgia: Secondary | ICD-10-CM | POA: Diagnosis not present

## 2023-08-19 DIAGNOSIS — R293 Abnormal posture: Secondary | ICD-10-CM | POA: Diagnosis not present

## 2023-09-01 DIAGNOSIS — R293 Abnormal posture: Secondary | ICD-10-CM | POA: Diagnosis not present

## 2023-09-01 DIAGNOSIS — M542 Cervicalgia: Secondary | ICD-10-CM | POA: Diagnosis not present

## 2023-09-08 ENCOUNTER — Inpatient Hospital Stay (HOSPITAL_BASED_OUTPATIENT_CLINIC_OR_DEPARTMENT_OTHER): Payer: Medicare Other | Admitting: Internal Medicine

## 2023-09-08 ENCOUNTER — Inpatient Hospital Stay: Payer: Medicare Other | Attending: Internal Medicine

## 2023-09-08 VITALS — BP 150/63 | HR 83 | Temp 97.4°F | Resp 18 | Ht 60.0 in | Wt 121.0 lb

## 2023-09-08 DIAGNOSIS — D539 Nutritional anemia, unspecified: Secondary | ICD-10-CM | POA: Diagnosis not present

## 2023-09-08 DIAGNOSIS — D509 Iron deficiency anemia, unspecified: Secondary | ICD-10-CM | POA: Insufficient documentation

## 2023-09-08 DIAGNOSIS — J449 Chronic obstructive pulmonary disease, unspecified: Secondary | ICD-10-CM | POA: Diagnosis not present

## 2023-09-08 DIAGNOSIS — N189 Chronic kidney disease, unspecified: Secondary | ICD-10-CM | POA: Diagnosis not present

## 2023-09-08 DIAGNOSIS — D631 Anemia in chronic kidney disease: Secondary | ICD-10-CM | POA: Diagnosis not present

## 2023-09-08 DIAGNOSIS — D649 Anemia, unspecified: Secondary | ICD-10-CM

## 2023-09-08 LAB — IRON AND IRON BINDING CAPACITY (CC-WL,HP ONLY)
Iron: 50 ug/dL (ref 28–170)
Saturation Ratios: 19 % (ref 10.4–31.8)
TIBC: 258 ug/dL (ref 250–450)
UIBC: 208 ug/dL (ref 148–442)

## 2023-09-08 LAB — CBC WITH DIFFERENTIAL (CANCER CENTER ONLY)
Abs Immature Granulocytes: 0.27 10*3/uL — ABNORMAL HIGH (ref 0.00–0.07)
Basophils Absolute: 0.2 10*3/uL — ABNORMAL HIGH (ref 0.0–0.1)
Basophils Relative: 3 %
Eosinophils Absolute: 0.2 10*3/uL (ref 0.0–0.5)
Eosinophils Relative: 3 %
HCT: 28.3 % — ABNORMAL LOW (ref 36.0–46.0)
Hemoglobin: 9.1 g/dL — ABNORMAL LOW (ref 12.0–15.0)
Immature Granulocytes: 5 %
Lymphocytes Relative: 19 %
Lymphs Abs: 1 10*3/uL (ref 0.7–4.0)
MCH: 25.1 pg — ABNORMAL LOW (ref 26.0–34.0)
MCHC: 32.2 g/dL (ref 30.0–36.0)
MCV: 78.2 fL — ABNORMAL LOW (ref 80.0–100.0)
Monocytes Absolute: 0.7 10*3/uL (ref 0.1–1.0)
Monocytes Relative: 12 %
Neutro Abs: 3.1 10*3/uL (ref 1.7–7.7)
Neutrophils Relative %: 58 %
Platelet Count: 141 10*3/uL — ABNORMAL LOW (ref 150–400)
RBC: 3.62 MIL/uL — ABNORMAL LOW (ref 3.87–5.11)
RDW: 24 % — ABNORMAL HIGH (ref 11.5–15.5)
WBC Count: 5.3 10*3/uL (ref 4.0–10.5)
nRBC: 0 % (ref 0.0–0.2)

## 2023-09-08 LAB — FERRITIN: Ferritin: 155 ng/mL (ref 11–307)

## 2023-09-08 NOTE — Progress Notes (Signed)
 [facility] [phone]   [phone]  OFFICE PROGRESS NOTE  Barnetta Liberty, MD [address] [place] [address]  DIAGNOSIS: Persistent mild anemia likely anemia of chronic disease secondary to renal insufficiency in addition to iron deficiency from history of gastrointestinal blood loss and recent Hemoccult positive stool.   PRIOR THERAPY:   CURRENT THERAPY: Over-the-counter ferrous sulfate 325 mg p.o. daily with orange juice.  INTERVAL HISTORY: Judith Boone 89 y.o. female returns to the clinic today for 3-month follow-up visit. Discussed the use of AI scribe software for clinical note transcription with the patient, who gave verbal consent to proceed.  History of Present Illness   Judith Boone is an 89 year old female with persistent anemia who presents for evaluation and repeat blood work.  She has a history of persistent anemia, likely due to chronic disease secondary to renal insufficiency and iron deficiency from gastrointestinal blood loss, as indicated by a positive hemoccult stool. She is currently taking over-the-counter ferrous sulfate with orange juice once daily. Her hemoglobin level today is 9.1, consistent with previous levels, indicating stability in her condition. No new symptoms of fatigue are reported, and she feels fine. No recent blood in her stool, and her stool color is brown, not dark or black.  In March, she experienced a flare-up of her chronic obstructive pulmonary disease (COPD) after contracting a slight case of the flu, which led to hospitalization. She attributes the exacerbation to the flu and subsequent pollen exposure. She has a lot of cough but no chest pain or increased shortness of breath compared to before.  Her daughter, who is 77 years old, has also been diagnosed with anemia, prompting her to inquire about the hereditary nature of the condition.       MEDICAL HISTORY: Past Medical History:  Diagnosis  Date   Arthritis    Basal cell carcinoma    on  her face   Blood transfusion without reported diagnosis 2022   Cataract    bilateral sx   Cellulitis of left leg 07/28/2014   hospitalized for 4 days   Chronic kidney disease    COPD (chronic obstructive pulmonary disease) (HCC)    Dysrhythmia    one episode of a-fib no other issues   Emphysema of lung (HCC)    Esophageal web    GERD (gastroesophageal reflux disease)    History of granulomatous disease    History of hiatal hernia    Hyperlipidemia    on meds   Hypertension    on meds   Iron deficiency anemia    Left leg swelling    Paroxysmal atrial fibrillation (HCC)    Pneumonia    2022   Right-sided low back pain with sciatica    Seasonal allergies    Sepsis (HCC) 08/17/2014    ALLERGIES:  is allergic to bacitracin, neosporin [neomycin-bacitracin zn-polymyx], and polysporin [bacitracin-polymyxin b].  MEDICATIONS:  Current Outpatient Medications  Medication Sig Dispense Refill   amLODipine  (NORVASC ) 5 MG tablet Take 5 mg by mouth at bedtime.   5   ANORO ELLIPTA  62.5-25 MCG/INH AEPB Inhale 1 puff into the lungs daily.     Ascorbic Acid  (VITAMIN C  PO) Take 1 tablet by mouth daily.     atorvastatin  (LIPITOR) 20 MG tablet Take 20 mg by mouth at bedtime.      Calcium  Citrate-Vitamin D  (CALCIUM  + D PO) Take 1 tablet by mouth 2 (two) times daily.  cetirizine (ZYRTEC) 10 MG tablet Take 10 mg by mouth daily as needed for allergies.     Cholecalciferol 25 MCG (1000 UT) capsule Take 1,000 Units by mouth daily.     ferrous sulfate 325 (65 FE) MG tablet Take 325 mg by mouth daily.     hydrALAZINE  (APRESOLINE ) 25 MG tablet Take 25 mg by mouth 2 (two) times daily.      metoprolol  succinate (TOPROL -XL) 25 MG 24 hr tablet Take 1 tablet (25 mg total) by mouth daily. 90 tablet 2   montelukast  (SINGULAIR ) 10 MG tablet Take 10 mg by mouth daily.     Multiple Vitamin (MULTIVITAMIN) tablet Take 1 tablet by mouth daily.     omeprazole   (PRILOSEC) 40 MG capsule Take 40 mg by mouth in the morning and at bedtime.     OXYGEN  Inhale into the lungs. As needed     pramipexole  (MIRAPEX ) 0.25 MG tablet Take 0.25 mg by mouth at bedtime.     Spacer/Aero-Holding Chambers (AEROCHAMBER MV) inhaler Use as instructed with Albuterol  HFA 1 each 0   VENTOLIN  HFA 108 (90 BASE) MCG/ACT inhaler Inhale 1-2 puffs into the lungs every 6 (six) hours as needed for wheezing.   3   No current facility-administered medications for this visit.    SURGICAL HISTORY:  Past Surgical History:  Procedure Laterality Date   ABDOMINAL HYSTERECTOMY  2000   ANKLE SURGERY Left 1996   APPENDECTOMY  1978   BALLOON DILATION N/A 07/29/2021   Procedure: BALLOON DILATION;  Surgeon: Kenney Peacemaker, MD;  Location: [facility];  Service: Endoscopy;  Laterality: N/A;   BIOPSY  07/29/2021   Procedure: BIOPSY;  Surgeon: Kenney Peacemaker, MD;  Location: [facility];  Service: Endoscopy;;   CATARACT EXTRACTION Bilateral 2013   COLONOSCOPY WITH PROPOFOL  N/A 12/30/2022   Procedure: COLONOSCOPY WITH PROPOFOL ;  Surgeon: Kenney Peacemaker, MD;  Location: [facility];  Service: Gastroenterology;  Laterality: N/A;   ESOPHAGOGASTRODUODENOSCOPY (EGD) WITH PROPOFOL  N/A 07/29/2021   Procedure: ESOPHAGOGASTRODUODENOSCOPY (EGD) WITH PROPOFOL ;  Surgeon: Kenney Peacemaker, MD;  Location: [facility];  Service: Endoscopy;  Laterality: N/A;   TOTAL KNEE ARTHROPLASTY Right 2008   TOTAL KNEE ARTHROPLASTY Left 2012    REVIEW OF SYSTEMS:  A comprehensive review of systems was negative except for: Constitutional: positive for fatigue Respiratory: positive for dyspnea on exertion Musculoskeletal: positive for arthralgias   PHYSICAL EXAMINATION: General appearance: alert, cooperative, and no distress Head: Normocephalic, without obvious abnormality, atraumatic Neck: no adenopathy, no JVD, supple, symmetrical, trachea midline, and thyroid  not enlarged, symmetric, no tenderness/mass/nodules Lymph  nodes: Cervical, supraclavicular, and axillary nodes normal. Resp: clear to auscultation bilaterally Back: symmetric, no curvature. ROM normal. No CVA tenderness. Cardio: regular rate and rhythm, S1, S2 normal, no murmur, click, rub or gallop GI: soft, non-tender; bowel sounds normal; no masses,  no organomegaly Extremities: extremities normal, atraumatic, no cyanosis or edema  ECOG PERFORMANCE STATUS: 1 - Symptomatic but completely ambulatory  Blood pressure (!) 150/63, pulse 83, temperature (!) 97.4 F (36.3 C), temperature source Temporal, resp. rate 18, height 5' (1.524 m), weight 121 lb (54.9 kg), SpO2 98%.  LABORATORY DATA: Lab Results  Component Value Date   WBC 5.3 09/08/2023   HGB 9.1 (L) 09/08/2023   HCT 28.3 (L) 09/08/2023   MCV 78.2 (L) 09/08/2023   PLT 141 (L) 09/08/2023      Chemistry      Component Value Date/Time   NA 136 07/23/2023 0633   K 5.1 07/23/2023  1610   CL 105 07/23/2023 0633   CO2 16 (L) 07/23/2023 0633   BUN 24 (H) 07/23/2023 0633   CREATININE 1.31 (H) 07/23/2023 0633   CREATININE 1.67 (H) 10/18/2022 1042   CREATININE 1.17 (H) 09/21/2012 0953      Component Value Date/Time   CALCIUM  8.3 (L) 07/23/2023 0633   ALKPHOS 105 07/22/2023 1926   AST 25 07/22/2023 1926   AST 17 10/18/2022 1042   ALT 18 07/22/2023 1926   ALT 12 10/18/2022 1042   BILITOT 0.8 07/22/2023 1926   BILITOT 0.6 10/18/2022 1042       RADIOGRAPHIC STUDIES: No results found.  ASSESSMENT AND PLAN: This is a very pleasant 89 years old white female with Persistent mild anemia likely anemia of chronic disease secondary to renal insufficiency in addition to iron deficiency from history of gastrointestinal blood loss and recent Hemoccult positive stool.  The patient is currently on over-the-counter ferrous sulfate with orange juice once daily and tolerating it fairly well.     Anemia of chronic disease and iron deficiency anemia Persistent anemia likely due to a combination of  anemia of chronic disease secondary to renal insufficiency and iron deficiency from gastrointestinal blood loss. Hemoglobin level remains at 9.1 g/dL. Awaiting iron studies to guide further management. No significant symptoms reported, and stool is brown without melena. - Continue over-the-counter ferrous sulfate with orange juice once daily. - Await results of iron studies. - Consider iron infusion if iron studies indicate very low levels. - Schedule follow-up appointment in four months.  Chronic obstructive pulmonary disease (COPD) Recent exacerbation in March triggered by influenza and pollen exposure, requiring hospitalization. Currently, no increased dyspnea or chest pain, but persistent cough is present. - Encourage avoidance of known triggers such as pollen. - Consider follow-up with pulmonology if symptoms persist or worsen.   She was advised to call immediately if she has any concerning symptoms in the interval. The patient voices understanding of current disease status and treatment options and is in agreement with the current care plan.  All questions were answered. The patient knows to call the clinic with any problems, questions or concerns. We can certainly see the patient much sooner if necessary.  The total time spent in the appointment was 20 minutes.  Disclaimer: This note was dictated with voice recognition software. Similar sounding words can inadvertently be transcribed and may not be corrected upon review.

## 2023-09-09 DIAGNOSIS — R293 Abnormal posture: Secondary | ICD-10-CM | POA: Diagnosis not present

## 2023-09-09 DIAGNOSIS — M542 Cervicalgia: Secondary | ICD-10-CM | POA: Diagnosis not present

## 2023-09-10 DIAGNOSIS — R5381 Other malaise: Secondary | ICD-10-CM | POA: Diagnosis not present

## 2023-09-10 DIAGNOSIS — J8410 Pulmonary fibrosis, unspecified: Secondary | ICD-10-CM | POA: Diagnosis not present

## 2023-09-10 DIAGNOSIS — Z9981 Dependence on supplemental oxygen: Secondary | ICD-10-CM | POA: Diagnosis not present

## 2023-09-10 DIAGNOSIS — J9621 Acute and chronic respiratory failure with hypoxia: Secondary | ICD-10-CM | POA: Diagnosis not present

## 2023-09-10 DIAGNOSIS — Z79891 Long term (current) use of opiate analgesic: Secondary | ICD-10-CM | POA: Diagnosis not present

## 2023-09-10 DIAGNOSIS — J432 Centrilobular emphysema: Secondary | ICD-10-CM | POA: Diagnosis not present

## 2023-09-11 DIAGNOSIS — M542 Cervicalgia: Secondary | ICD-10-CM | POA: Diagnosis not present

## 2023-09-11 DIAGNOSIS — R293 Abnormal posture: Secondary | ICD-10-CM | POA: Diagnosis not present

## 2023-09-14 DIAGNOSIS — I4891 Unspecified atrial fibrillation: Secondary | ICD-10-CM | POA: Diagnosis not present

## 2023-09-14 DIAGNOSIS — J9611 Chronic respiratory failure with hypoxia: Secondary | ICD-10-CM | POA: Diagnosis not present

## 2023-09-14 DIAGNOSIS — E611 Iron deficiency: Secondary | ICD-10-CM | POA: Diagnosis not present

## 2023-09-14 DIAGNOSIS — D649 Anemia, unspecified: Secondary | ICD-10-CM | POA: Diagnosis not present

## 2023-09-14 DIAGNOSIS — Z9981 Dependence on supplemental oxygen: Secondary | ICD-10-CM | POA: Diagnosis not present

## 2023-09-14 DIAGNOSIS — I700 Atherosclerosis of aorta: Secondary | ICD-10-CM | POA: Diagnosis not present

## 2023-09-14 DIAGNOSIS — J449 Chronic obstructive pulmonary disease, unspecified: Secondary | ICD-10-CM | POA: Diagnosis not present

## 2023-09-14 DIAGNOSIS — N1832 Chronic kidney disease, stage 3b: Secondary | ICD-10-CM | POA: Diagnosis not present

## 2023-09-14 DIAGNOSIS — D696 Thrombocytopenia, unspecified: Secondary | ICD-10-CM | POA: Diagnosis not present

## 2023-09-15 DIAGNOSIS — R293 Abnormal posture: Secondary | ICD-10-CM | POA: Diagnosis not present

## 2023-09-15 DIAGNOSIS — M542 Cervicalgia: Secondary | ICD-10-CM | POA: Diagnosis not present

## 2023-09-16 DIAGNOSIS — H353211 Exudative age-related macular degeneration, right eye, with active choroidal neovascularization: Secondary | ICD-10-CM | POA: Diagnosis not present

## 2023-09-17 DIAGNOSIS — M542 Cervicalgia: Secondary | ICD-10-CM | POA: Diagnosis not present

## 2023-09-17 DIAGNOSIS — R293 Abnormal posture: Secondary | ICD-10-CM | POA: Diagnosis not present

## 2023-09-22 DIAGNOSIS — R293 Abnormal posture: Secondary | ICD-10-CM | POA: Diagnosis not present

## 2023-09-22 DIAGNOSIS — M542 Cervicalgia: Secondary | ICD-10-CM | POA: Diagnosis not present

## 2023-09-24 DIAGNOSIS — M542 Cervicalgia: Secondary | ICD-10-CM | POA: Diagnosis not present

## 2023-09-24 DIAGNOSIS — R293 Abnormal posture: Secondary | ICD-10-CM | POA: Diagnosis not present

## 2023-09-29 DIAGNOSIS — M542 Cervicalgia: Secondary | ICD-10-CM | POA: Diagnosis not present

## 2023-09-29 DIAGNOSIS — R293 Abnormal posture: Secondary | ICD-10-CM | POA: Diagnosis not present

## 2023-10-05 DIAGNOSIS — M542 Cervicalgia: Secondary | ICD-10-CM | POA: Diagnosis not present

## 2023-10-05 DIAGNOSIS — R293 Abnormal posture: Secondary | ICD-10-CM | POA: Diagnosis not present

## 2023-10-08 DIAGNOSIS — M542 Cervicalgia: Secondary | ICD-10-CM | POA: Diagnosis not present

## 2023-10-08 DIAGNOSIS — R293 Abnormal posture: Secondary | ICD-10-CM | POA: Diagnosis not present

## 2023-10-13 DIAGNOSIS — R293 Abnormal posture: Secondary | ICD-10-CM | POA: Diagnosis not present

## 2023-10-13 DIAGNOSIS — M542 Cervicalgia: Secondary | ICD-10-CM | POA: Diagnosis not present

## 2023-10-15 DIAGNOSIS — R293 Abnormal posture: Secondary | ICD-10-CM | POA: Diagnosis not present

## 2023-10-15 DIAGNOSIS — M542 Cervicalgia: Secondary | ICD-10-CM | POA: Diagnosis not present

## 2023-10-16 DIAGNOSIS — R293 Abnormal posture: Secondary | ICD-10-CM | POA: Diagnosis not present

## 2023-10-16 DIAGNOSIS — M542 Cervicalgia: Secondary | ICD-10-CM | POA: Diagnosis not present

## 2023-10-20 DIAGNOSIS — M542 Cervicalgia: Secondary | ICD-10-CM | POA: Diagnosis not present

## 2023-10-20 DIAGNOSIS — R293 Abnormal posture: Secondary | ICD-10-CM | POA: Diagnosis not present

## 2023-10-27 DIAGNOSIS — R293 Abnormal posture: Secondary | ICD-10-CM | POA: Diagnosis not present

## 2023-10-27 DIAGNOSIS — M542 Cervicalgia: Secondary | ICD-10-CM | POA: Diagnosis not present

## 2023-10-30 DIAGNOSIS — M542 Cervicalgia: Secondary | ICD-10-CM | POA: Diagnosis not present

## 2023-10-30 DIAGNOSIS — R293 Abnormal posture: Secondary | ICD-10-CM | POA: Diagnosis not present

## 2023-11-02 DIAGNOSIS — M542 Cervicalgia: Secondary | ICD-10-CM | POA: Diagnosis not present

## 2023-11-02 DIAGNOSIS — R293 Abnormal posture: Secondary | ICD-10-CM | POA: Diagnosis not present

## 2023-11-03 DIAGNOSIS — D692 Other nonthrombocytopenic purpura: Secondary | ICD-10-CM | POA: Diagnosis not present

## 2023-11-03 DIAGNOSIS — D1801 Hemangioma of skin and subcutaneous tissue: Secondary | ICD-10-CM | POA: Diagnosis not present

## 2023-11-03 DIAGNOSIS — L820 Inflamed seborrheic keratosis: Secondary | ICD-10-CM | POA: Diagnosis not present

## 2023-11-03 DIAGNOSIS — Z85828 Personal history of other malignant neoplasm of skin: Secondary | ICD-10-CM | POA: Diagnosis not present

## 2023-11-03 DIAGNOSIS — L821 Other seborrheic keratosis: Secondary | ICD-10-CM | POA: Diagnosis not present

## 2023-11-06 DIAGNOSIS — R293 Abnormal posture: Secondary | ICD-10-CM | POA: Diagnosis not present

## 2023-11-06 DIAGNOSIS — M542 Cervicalgia: Secondary | ICD-10-CM | POA: Diagnosis not present

## 2023-11-10 DIAGNOSIS — R293 Abnormal posture: Secondary | ICD-10-CM | POA: Diagnosis not present

## 2023-11-10 DIAGNOSIS — M542 Cervicalgia: Secondary | ICD-10-CM | POA: Diagnosis not present

## 2023-11-13 DIAGNOSIS — M542 Cervicalgia: Secondary | ICD-10-CM | POA: Diagnosis not present

## 2023-11-13 DIAGNOSIS — R293 Abnormal posture: Secondary | ICD-10-CM | POA: Diagnosis not present

## 2023-11-19 DIAGNOSIS — M542 Cervicalgia: Secondary | ICD-10-CM | POA: Diagnosis not present

## 2023-11-19 DIAGNOSIS — R293 Abnormal posture: Secondary | ICD-10-CM | POA: Diagnosis not present

## 2023-11-24 DIAGNOSIS — M542 Cervicalgia: Secondary | ICD-10-CM | POA: Diagnosis not present

## 2023-11-24 DIAGNOSIS — R293 Abnormal posture: Secondary | ICD-10-CM | POA: Diagnosis not present

## 2023-11-26 DIAGNOSIS — M6281 Muscle weakness (generalized): Secondary | ICD-10-CM | POA: Diagnosis not present

## 2023-11-27 DIAGNOSIS — M542 Cervicalgia: Secondary | ICD-10-CM | POA: Diagnosis not present

## 2023-11-27 DIAGNOSIS — R293 Abnormal posture: Secondary | ICD-10-CM | POA: Diagnosis not present

## 2023-12-01 DIAGNOSIS — H353211 Exudative age-related macular degeneration, right eye, with active choroidal neovascularization: Secondary | ICD-10-CM | POA: Diagnosis not present

## 2023-12-01 DIAGNOSIS — M6281 Muscle weakness (generalized): Secondary | ICD-10-CM | POA: Diagnosis not present

## 2023-12-03 DIAGNOSIS — M6281 Muscle weakness (generalized): Secondary | ICD-10-CM | POA: Diagnosis not present

## 2023-12-07 DIAGNOSIS — H35721 Serous detachment of retinal pigment epithelium, right eye: Secondary | ICD-10-CM | POA: Diagnosis not present

## 2023-12-07 DIAGNOSIS — H353211 Exudative age-related macular degeneration, right eye, with active choroidal neovascularization: Secondary | ICD-10-CM | POA: Diagnosis not present

## 2023-12-07 DIAGNOSIS — H353122 Nonexudative age-related macular degeneration, left eye, intermediate dry stage: Secondary | ICD-10-CM | POA: Diagnosis not present

## 2023-12-07 DIAGNOSIS — H35363 Drusen (degenerative) of macula, bilateral: Secondary | ICD-10-CM | POA: Diagnosis not present

## 2023-12-08 DIAGNOSIS — M6281 Muscle weakness (generalized): Secondary | ICD-10-CM | POA: Diagnosis not present

## 2023-12-11 DIAGNOSIS — M6281 Muscle weakness (generalized): Secondary | ICD-10-CM | POA: Diagnosis not present

## 2023-12-15 DIAGNOSIS — M6281 Muscle weakness (generalized): Secondary | ICD-10-CM | POA: Diagnosis not present

## 2023-12-17 ENCOUNTER — Emergency Department (HOSPITAL_BASED_OUTPATIENT_CLINIC_OR_DEPARTMENT_OTHER)

## 2023-12-17 ENCOUNTER — Encounter (HOSPITAL_BASED_OUTPATIENT_CLINIC_OR_DEPARTMENT_OTHER): Payer: Self-pay

## 2023-12-17 ENCOUNTER — Emergency Department (HOSPITAL_BASED_OUTPATIENT_CLINIC_OR_DEPARTMENT_OTHER)
Admission: EM | Admit: 2023-12-17 | Discharge: 2023-12-18 | Disposition: A | Discharge status: Home / Self Care | Attending: Emergency Medicine | Admitting: Emergency Medicine

## 2023-12-17 ENCOUNTER — Other Ambulatory Visit: Payer: Self-pay

## 2023-12-17 DIAGNOSIS — I10 Essential (primary) hypertension: Secondary | ICD-10-CM | POA: Insufficient documentation

## 2023-12-17 DIAGNOSIS — S0990XA Unspecified injury of head, initial encounter: Secondary | ICD-10-CM | POA: Diagnosis not present

## 2023-12-17 DIAGNOSIS — M4312 Spondylolisthesis, cervical region: Secondary | ICD-10-CM | POA: Diagnosis not present

## 2023-12-17 DIAGNOSIS — S0101XA Laceration without foreign body of scalp, initial encounter: Secondary | ICD-10-CM | POA: Diagnosis not present

## 2023-12-17 DIAGNOSIS — M542 Cervicalgia: Secondary | ICD-10-CM | POA: Diagnosis not present

## 2023-12-17 DIAGNOSIS — S0003XA Contusion of scalp, initial encounter: Secondary | ICD-10-CM | POA: Diagnosis not present

## 2023-12-17 DIAGNOSIS — S81011A Laceration without foreign body, right knee, initial encounter: Secondary | ICD-10-CM | POA: Diagnosis not present

## 2023-12-17 DIAGNOSIS — Z23 Encounter for immunization: Secondary | ICD-10-CM | POA: Diagnosis not present

## 2023-12-17 DIAGNOSIS — M546 Pain in thoracic spine: Secondary | ICD-10-CM | POA: Insufficient documentation

## 2023-12-17 DIAGNOSIS — Z79899 Other long term (current) drug therapy: Secondary | ICD-10-CM | POA: Insufficient documentation

## 2023-12-17 DIAGNOSIS — H0100B Unspecified blepharitis left eye, upper and lower eyelids: Secondary | ICD-10-CM | POA: Diagnosis not present

## 2023-12-17 DIAGNOSIS — S5012XA Contusion of left forearm, initial encounter: Secondary | ICD-10-CM | POA: Insufficient documentation

## 2023-12-17 DIAGNOSIS — H52203 Unspecified astigmatism, bilateral: Secondary | ICD-10-CM | POA: Diagnosis not present

## 2023-12-17 DIAGNOSIS — M25561 Pain in right knee: Secondary | ICD-10-CM | POA: Diagnosis not present

## 2023-12-17 DIAGNOSIS — H04123 Dry eye syndrome of bilateral lacrimal glands: Secondary | ICD-10-CM | POA: Diagnosis not present

## 2023-12-17 DIAGNOSIS — M4802 Spinal stenosis, cervical region: Secondary | ICD-10-CM | POA: Diagnosis not present

## 2023-12-17 DIAGNOSIS — H524 Presbyopia: Secondary | ICD-10-CM | POA: Diagnosis not present

## 2023-12-17 DIAGNOSIS — J449 Chronic obstructive pulmonary disease, unspecified: Secondary | ICD-10-CM | POA: Diagnosis not present

## 2023-12-17 DIAGNOSIS — M5030 Other cervical disc degeneration, unspecified cervical region: Secondary | ICD-10-CM | POA: Diagnosis not present

## 2023-12-17 DIAGNOSIS — M545 Low back pain, unspecified: Secondary | ICD-10-CM | POA: Diagnosis not present

## 2023-12-17 DIAGNOSIS — W010XXA Fall on same level from slipping, tripping and stumbling without subsequent striking against object, initial encounter: Secondary | ICD-10-CM | POA: Insufficient documentation

## 2023-12-17 DIAGNOSIS — S199XXA Unspecified injury of neck, initial encounter: Secondary | ICD-10-CM | POA: Diagnosis not present

## 2023-12-17 DIAGNOSIS — W19XXXA Unspecified fall, initial encounter: Secondary | ICD-10-CM | POA: Diagnosis not present

## 2023-12-17 DIAGNOSIS — Z043 Encounter for examination and observation following other accident: Secondary | ICD-10-CM | POA: Diagnosis not present

## 2023-12-17 DIAGNOSIS — H353122 Nonexudative age-related macular degeneration, left eye, intermediate dry stage: Secondary | ICD-10-CM | POA: Diagnosis not present

## 2023-12-17 DIAGNOSIS — H0100A Unspecified blepharitis right eye, upper and lower eyelids: Secondary | ICD-10-CM | POA: Diagnosis not present

## 2023-12-17 LAB — BASIC METABOLIC PANEL WITH GFR
Anion gap: 13 (ref 5–15)
BUN: 36 mg/dL — ABNORMAL HIGH (ref 8–23)
CO2: 20 mmol/L — ABNORMAL LOW (ref 22–32)
Calcium: 8.7 mg/dL — ABNORMAL LOW (ref 8.9–10.3)
Chloride: 108 mmol/L (ref 98–111)
Creatinine, Ser: 1.35 mg/dL — ABNORMAL HIGH (ref 0.44–1.00)
GFR, Estimated: 37 mL/min — ABNORMAL LOW (ref >60)
Glucose, Bld: 99 mg/dL (ref 70–99)
Potassium: 4.8 mmol/L (ref 3.5–5.1)
Sodium: 141 mmol/L (ref 135–145)

## 2023-12-17 MED ORDER — ACETAMINOPHEN 325 MG PO TABS
650.0000 mg | ORAL_TABLET | Freq: Once | ORAL | Status: AC
  Administered 2023-12-17: 650 mg via ORAL
  Filled 2023-12-17: qty 2

## 2023-12-17 MED ORDER — TETANUS-DIPHTH-ACELL PERTUSSIS 5-2.5-18.5 LF-MCG/0.5 IM SUSY
0.5000 mL | PREFILLED_SYRINGE | Freq: Once | INTRAMUSCULAR | Status: AC
  Administered 2023-12-17: 0.5 mL via INTRAMUSCULAR
  Filled 2023-12-17: qty 0.5

## 2023-12-17 MED ORDER — LIDOCAINE-EPINEPHRINE 1 %-1:100000 IJ SOLN
30.0000 mL | Freq: Once | INTRAMUSCULAR | Status: AC
  Administered 2023-12-17: 30 mL
  Filled 2023-12-17: qty 2

## 2023-12-17 NOTE — ED Triage Notes (Addendum)
 Arrives [facility] from independent living at [facility].   Reports standing in the bathroom and tripping falling in the bathroom. Had eyes dilated earlier and has had difficulty seeing.   ~4cm laceration to head. ~20cm laceration on right knee.   Denies LOC. Denies anticoagulants.

## 2023-12-17 NOTE — ED Provider Notes (Signed)
 [facility] Provider Note   CSN: [id number] Arrival date & time: 12/17/23  2107     Patient presents with: Judith Boone is a 89 y.o. female.    Fall     Patient has a history of arthritis acid reflux hypertension hyperlipidemia COPD sciatica paroxysmal atrial fibrillation.  Patient was at the eye doctor and had her eyes dilated today.  She has not been seeing as well today since that time.  Patient was walking to the bathroom when she ended up tripping and falling.  She hit her head and landed on her knee and left forearm.  She has a bruise to her forearm and sustained a laceration to her knee as well as the top of her head.  She denies any loss of consciousness.  No numbness no weakness.  No difficulty breathing.  Prior to Admission medications   Medication Sig Start Date End Date Taking? Authorizing Provider  amLODipine  (NORVASC ) 5 MG tablet Take 5 mg by mouth at bedtime.  04/13/15   [provider]  ANORO ELLIPTA  62.5-25 MCG/INH AEPB Inhale 1 puff into the lungs daily. 07/04/18   [provider]  Ascorbic Acid  (VITAMIN C  PO) Take 1 tablet by mouth daily.    [provider]  atorvastatin  (LIPITOR) 20 MG tablet Take 20 mg by mouth at bedtime.     [provider]  Calcium  Citrate-Vitamin D  (CALCIUM  + D PO) Take 1 tablet by mouth 2 (two) times daily.    [provider]  cetirizine (ZYRTEC) 10 MG tablet Take 10 mg by mouth daily as needed for allergies.    [provider]  Cholecalciferol 25 MCG (1000 UT) capsule Take 1,000 Units by mouth daily. 04/28/17   [provider]  ferrous sulfate 325 (65 FE) MG tablet Take 325 mg by mouth daily.    [provider]  hydrALAZINE  (APRESOLINE ) 25 MG tablet Take 25 mg by mouth 2 (two) times daily.     [provider]  metoprolol  succinate (TOPROL -XL) 25 MG 24 hr tablet Take 1 tablet (25 mg total) by mouth daily. 07/23/18    Fenton, Clint R, PA  montelukast  (SINGULAIR ) 10 MG tablet Take 10 mg by mouth daily. 08/02/19   [provider]  Multiple Vitamin (MULTIVITAMIN) tablet Take 1 tablet by mouth daily.    [provider]  omeprazole  (PRILOSEC) 40 MG capsule Take 40 mg by mouth in the morning and at bedtime. 07/22/18   [provider]  OXYGEN  Inhale into the lungs. As needed    [provider]  pramipexole  (MIRAPEX ) 0.25 MG tablet Take 0.25 mg by mouth at bedtime. 02/04/21   [provider]  Spacer/Aero-Holding Chambers (AEROCHAMBER MV) inhaler Use as instructed with Albuterol  HFA 10/07/19   Parrett, Madelin GORMAN, NP  VENTOLIN  HFA 108 (90 BASE) MCG/ACT inhaler Inhale 1-2 puffs into the lungs every 6 (six) hours as needed for wheezing.  03/03/15   [provider]    Allergies: Bacitracin, Neosporin [neomycin-bacitracin zn-polymyx], and Polysporin [bacitracin-polymyxin b]    Review of Systems  Updated Vital Signs BP (!) 155/73   Pulse 67   Temp 98.1 F (36.7 C)   Resp 18   Ht 1.524 m (5')   Wt 52.2 kg   SpO2 94%   BMI 22.46 kg/m   Physical Exam Vitals and nursing note reviewed.  Constitutional:      General: She is not in acute distress.  Appearance: She is well-developed.  HENT:     Head: Normocephalic.     Comments: Laceration vertex scalp    Right Ear: External ear normal.     Left Ear: External ear normal.  Eyes:     General: No scleral icterus.       Right eye: No discharge.        Left eye: No discharge.     Conjunctiva/sclera: Conjunctivae normal.  Neck:     Trachea: No tracheal deviation.  Cardiovascular:     Rate and Rhythm: Normal rate and regular rhythm.  Pulmonary:     Effort: Pulmonary effort is normal. No respiratory distress.     Breath sounds: Normal breath sounds. No stridor. No wheezing or rales.  Abdominal:     General: Bowel sounds are normal. There is no distension.     Palpations: Abdomen is soft.     Tenderness: There is  no abdominal tenderness. There is no guarding or rebound.  Musculoskeletal:        General: Tenderness present. No deformity.     Cervical back: Neck supple.     Comments: Hematoma noted left forearm, mild tenderness patient in thoracic lumbar spine region, mild tenderness palpation cervical spine, large laceration extending from distal femur towards the proximal tibia overlying the knee  Skin:    General: Skin is warm and dry.     Findings: No rash.  Neurological:     General: No focal deficit present.     Mental Status: She is alert.     Cranial Nerves: No cranial nerve deficit, dysarthria or facial asymmetry.     Sensory: No sensory deficit.     Motor: No abnormal muscle tone or seizure activity.     Coordination: Coordination normal.  Psychiatric:        Mood and Affect: Mood normal.     (all labs ordered are listed, but only abnormal results are displayed) Labs Reviewed  BASIC METABOLIC PANEL WITH GFR - Abnormal; Notable for the following components:      Result Value   CO2 20 (*)    BUN 36 (*)    Creatinine, Ser 1.35 (*)    Calcium  8.7 (*)    GFR, Estimated 37 (*)    All other components within normal limits  CBC    EKG: None  Radiology: CT Cervical Spine Wo Contrast Result Date: 12/17/2023 CLINICAL DATA:  89 year old post fall in the bathroom. EXAM: CT CERVICAL SPINE WITHOUT CONTRAST TECHNIQUE: Multidetector CT imaging of the cervical spine was performed without intravenous contrast. Multiplanar CT image reconstructions were also generated. RADIATION DOSE REDUCTION: This exam was performed according to the departmental dose-optimization program which includes automated exposure control, adjustment of the mA and/or kV according to patient size and/or use of iterative reconstruction technique. COMPARISON:  None Available. FINDINGS: Alignment: No evidence of traumatic subluxation. 2 mm anterolisthesis of C2 on C3, 3 mm anterolisthesis of C3 on C4 and 4 mm anterolisthesis of  C6 on C7. Skull base and vertebrae: No acute fracture. Vertebral body heights are maintained. The dens and skull base are intact. There is an 8 mm lucency involving the posterior aspect of the dens with overlying loss of cortex. Moderate C1-C2 pannus. Soft tissues and spinal canal: No prevertebral fluid or swelling. No visible canal hematoma. Disc levels: Advanced bilateral facet hypertrophy with moderate to advanced diffuse degenerative disc disease. Spinal canal stenosis at C4-C5. Upper chest: Emphysema.  No acute findings. Other: Carotid calcifications. IMPRESSION: 1.  No acute fracture or traumatic subluxation of the cervical spine. 2. Advanced multilevel degenerative disc disease and facet hypertrophy. Degenerative anterolisthesis C2 on C3, C3 on C4, and C6 on C7. 3. An 8 mm lucency involving the posterior aspect of the dens with overlying loss of cortex. This is nonspecific and may be degenerative in nature, however a lytic lesion is not excluded. Recommend correlation for any history of malignancy. Consider MRI as clinically indicated. Electronically Signed   By: Andrea Gasman M.D.   On: 12/17/2023 23:33   DG Forearm Left Result Date: 12/17/2023 CLINICAL DATA:  Fall with bruising to left forearm EXAM: LEFT FOREARM - 2 VIEW COMPARISON:  None Available. FINDINGS: There is no evidence of fracture or other focal bone lesions. Soft tissues are unremarkable. IMPRESSION: Negative. Electronically Signed   By: Norman Gatlin M.D.   On: 12/17/2023 23:29   DG Knee Complete 4 Views Right Result Date: 12/17/2023 CLINICAL DATA:  Fall, pain. Fell in the bathroom. Laceration to right knee EXAM: RIGHT KNEE - COMPLETE 4+ VIEW COMPARISON:  10/28/2016 FINDINGS: Right TKA. No radiographic evidence of loosening. No acute fracture or dislocation. Soft tissues are unremarkable. IMPRESSION: RIGHT TKA without radiographic evidence of loosening. No acute fracture. Electronically Signed   By: Norman Gatlin M.D.   On:  12/17/2023 23:28   CT Head Wo Contrast Result Date: 12/17/2023 CLINICAL DATA:  89 year old post fall in the bathroom. EXAM: CT HEAD WITHOUT CONTRAST TECHNIQUE: Contiguous axial images were obtained from the base of the skull through the vertex without intravenous contrast. RADIATION DOSE REDUCTION: This exam was performed according to the departmental dose-optimization program which includes automated exposure control, adjustment of the mA and/or kV according to patient size and/or use of iterative reconstruction technique. COMPARISON:  None Available. FINDINGS: Brain: No intracranial hemorrhage, mass effect, or midline shift. Age related atrophy and chronic small vessel ischemia. No hydrocephalus. The basilar cisterns are patent. No evidence of territorial infarct or acute ischemia. Enlarged partially empty sella. No extra-axial or intracranial fluid collection. Vascular: Atherosclerosis of skullbase vasculature without hyperdense vessel or abnormal calcification. Skull: No fracture or focal lesion. Sinuses/Orbits: No acute findings. Minor mucosal thickening of paranasal sinuses. Other: Midline frontal scalp laceration and hematoma. IMPRESSION: 1. Midline frontal scalp laceration and hematoma. No acute intracranial abnormality. No skull fracture. 2. Age related atrophy and chronic small vessel ischemia. Electronically Signed   By: Andrea Gasman M.D.   On: 12/17/2023 23:28   DG Thoracic Spine 2 View Result Date: 12/17/2023 CLINICAL DATA:  Fall, pain, tripped and fell in the bathroom. Laceration to head and right knee EXAM: LUMBAR SPINE - 2-3 VIEW; THORACIC SPINE 2 VIEWS COMPARISON:  Chest radiograph 07/04/2023 and CT 03/19/2022 FINDINGS: Demineralization and cross-table positioning limits assessment of the spine for subtle fractures. No evidence of acute fracture or traumatic listhesis. Left convex thoracolumbar curve. Multilevel spondylosis, disc space height loss, degenerative endplate changes greatest in  the lumbar spine where it is advanced. IMPRESSION: No evidence of acute fracture or traumatic listhesis in the thoracolumbar spine on exam limited due to positioning. Electronically Signed   By: Norman Gatlin M.D.   On: 12/17/2023 23:28   DG Lumbar Spine 2-3 Views Result Date: 12/17/2023 CLINICAL DATA:  Fall, pain, tripped and fell in the bathroom. Laceration to head and right knee EXAM: LUMBAR SPINE - 2-3 VIEW; THORACIC SPINE 2 VIEWS COMPARISON:  Chest radiograph 07/04/2023 and CT 03/19/2022 FINDINGS: Demineralization and cross-table positioning limits assessment of the spine for subtle fractures.  No evidence of acute fracture or traumatic listhesis. Left convex thoracolumbar curve. Multilevel spondylosis, disc space height loss, degenerative endplate changes greatest in the lumbar spine where it is advanced. IMPRESSION: No evidence of acute fracture or traumatic listhesis in the thoracolumbar spine on exam limited due to positioning. Electronically Signed   By: Norman Gatlin M.D.   On: 12/17/2023 23:28     Procedures   Medications Ordered in the ED  acetaminophen  (TYLENOL ) tablet 650 mg (650 mg Oral Given 12/17/23 2211)  Tdap (BOOSTRIX ) injection 0.5 mL (0.5 mLs Intramuscular Given 12/17/23 2232)  lidocaine -EPINEPHrine  (XYLOCAINE  W/EPI) 1 %-1:100000 (with pres) injection 30 mL (30 mLs Infiltration Given 12/17/23 2214)                                    Medical Decision Making Amount and/or Complexity of Data Reviewed Labs: ordered. Radiology: ordered.  Risk OTC drugs. Prescription drug management.   Pt presented to the ED after a fall.  Lacerations noted to the scalp and knee.  Initial xrays without signs of serious head injury. NO extremity fracture.  No cervical spine fracture.  Lucency of the dens noted.  Discussed this finding with the patient.  Recommend outpatient MRI.  Lacerations repaired by PA Hoy.  Will make sure patient is safe for discharge after laceration  repaired.  Care turned over to Dr. Wanita.     Final diagnoses:  Laceration of scalp, initial encounter  Laceration of right knee, initial encounter    ED Discharge Orders     None          Randol Simmonds, MD 12/17/23 2344

## 2023-12-17 NOTE — ED Notes (Signed)
 Pt. Had a fall this night causing injury to the L forearm with noted purple bruising and no deformity noted.    Noted back pain per Pt. In the lower L spine just above the buttock region.    Pt. Also has a noted laceration on the R schin area that has controlled bleding and is approx 5 inches long by 2 to 3 inches wide.    Pt. Has a lacerateion to the top front area of her scalp.  Noted with no bleeding but deep in nature the laceration is approx. 2 inches long.

## 2023-12-17 NOTE — ED Notes (Signed)
 ED Provider at bedside.

## 2023-12-17 NOTE — Discharge Instructions (Signed)
 Sutures should be removed in approximately 10-14 days. Your wound care doctor can help determine when they are ready to come out Apply antibiotic ointment to the wound.  There was also an incidental finding noted in the cervical spine area.  As we discussed follow-up with your primary care doctor to discuss possible outpatient MRI

## 2023-12-18 DIAGNOSIS — M6281 Muscle weakness (generalized): Secondary | ICD-10-CM | POA: Diagnosis not present

## 2023-12-18 NOTE — ED Provider Notes (Signed)
 .  Laceration Repair  Date/Time: 12/18/2023 12:05 AM  Performed by: Hoy Nidia FALCON, PA-C Authorized by: Hoy Nidia FALCON, PA-C   Consent:    Consent obtained:  Verbal   Consent given by:  Patient   Risks discussed:  Infection, need for additional repair, nerve damage, pain, poor cosmetic result, poor wound healing, retained foreign body, tendon damage and vascular damage Universal protocol:    Procedure explained and questions answered to patient or proxy's satisfaction: yes     Patient identity confirmed:  Verbally with patient Anesthesia:    Anesthesia method:  Local infiltration   Local anesthetic:  Lidocaine  1% WITH epi Laceration details:    Location:  Leg   Leg location:  R knee   Length (cm):  10 Exploration:    Hemostasis achieved with:  Direct pressure   Imaging obtained: x-ray   Treatment:    Area cleansed with:  Povidone-iodine and saline   Amount of cleaning:  Standard   Irrigation solution:  Sterile saline   Irrigation volume:    Irrigation method:  Pressure wash and tap Skin repair:    Repair method:  Sutures   Suture size:  4-0   Suture material:  Prolene   Suture technique:  Horizontal mattress and simple interrupted   Number of sutures:  9 Approximation:    Approximation:  Close Repair type:    Repair type:  Simple Post-procedure details:    Dressing:  Non-adherent dressing   Procedure completion:  Tolerated well, no immediate complications Comments:     5 horizontal mattress and 4 simple interrupted sutures .Laceration Repair  Date/Time: 12/18/2023 12:07 AM  Performed by: Hoy Nidia FALCON, PA-C Authorized by: Hoy Nidia FALCON, PA-C   Consent:    Consent obtained:  Verbal   Consent given by:  Patient   Risks, benefits, and alternatives were discussed: yes     Risks discussed:  Infection, need for additional repair, nerve damage, poor cosmetic result, pain, poor wound healing, retained foreign body, tendon damage and  vascular damage Universal protocol:    Patient identity confirmed:  Verbally with patient Laceration details:    Location:  Scalp   Scalp location:  Crown   Length (cm):  3 Exploration:    Hemostasis achieved with:  Direct pressure Treatment:    Area cleansed with:  Povidone-iodine and saline   Amount of cleaning:  Standard   Irrigation volume:    Irrigation method:  Pressure wash Skin repair:    Repair method:  Staples   Number of staples:  3 Approximation:    Approximation:  Close Repair type:    Repair type:  Simple Post-procedure details:    Procedure completion:  Tolerated well, no immediate complications          Hoy Nidia FALCON, PA-C 12/18/23 0008    Randol Simmonds, MD 12/18/23 1139

## 2023-12-18 NOTE — ED Notes (Signed)
 Dressing applied on knee and head, pt and son Octaviano verbalized understanding of follow up with wound care

## 2023-12-22 DIAGNOSIS — M6281 Muscle weakness (generalized): Secondary | ICD-10-CM | POA: Diagnosis not present

## 2023-12-23 DIAGNOSIS — M6281 Muscle weakness (generalized): Secondary | ICD-10-CM | POA: Diagnosis not present

## 2023-12-29 DIAGNOSIS — J432 Centrilobular emphysema: Secondary | ICD-10-CM | POA: Diagnosis not present

## 2023-12-29 DIAGNOSIS — J9621 Acute and chronic respiratory failure with hypoxia: Secondary | ICD-10-CM | POA: Diagnosis not present

## 2023-12-29 DIAGNOSIS — J8410 Pulmonary fibrosis, unspecified: Secondary | ICD-10-CM | POA: Diagnosis not present

## 2023-12-31 DIAGNOSIS — S81811A Laceration without foreign body, right lower leg, initial encounter: Secondary | ICD-10-CM | POA: Diagnosis not present

## 2023-12-31 DIAGNOSIS — I4891 Unspecified atrial fibrillation: Secondary | ICD-10-CM | POA: Diagnosis not present

## 2023-12-31 DIAGNOSIS — T148XXA Other injury of unspecified body region, initial encounter: Secondary | ICD-10-CM | POA: Diagnosis not present

## 2023-12-31 DIAGNOSIS — R9389 Abnormal findings on diagnostic imaging of other specified body structures: Secondary | ICD-10-CM | POA: Diagnosis not present

## 2023-12-31 DIAGNOSIS — S0101XA Laceration without foreign body of scalp, initial encounter: Secondary | ICD-10-CM | POA: Diagnosis not present

## 2023-12-31 DIAGNOSIS — L089 Local infection of the skin and subcutaneous tissue, unspecified: Secondary | ICD-10-CM | POA: Diagnosis not present

## 2023-12-31 DIAGNOSIS — I10 Essential (primary) hypertension: Secondary | ICD-10-CM | POA: Diagnosis not present

## 2024-01-01 DIAGNOSIS — M6281 Muscle weakness (generalized): Secondary | ICD-10-CM | POA: Diagnosis not present

## 2024-01-04 ENCOUNTER — Other Ambulatory Visit: Payer: Self-pay | Admitting: Internal Medicine

## 2024-01-04 DIAGNOSIS — R9389 Abnormal findings on diagnostic imaging of other specified body structures: Secondary | ICD-10-CM

## 2024-01-05 DIAGNOSIS — M6281 Muscle weakness (generalized): Secondary | ICD-10-CM | POA: Diagnosis not present

## 2024-01-08 DIAGNOSIS — M6281 Muscle weakness (generalized): Secondary | ICD-10-CM | POA: Diagnosis not present

## 2024-01-11 DIAGNOSIS — Z23 Encounter for immunization: Secondary | ICD-10-CM | POA: Diagnosis not present

## 2024-01-12 ENCOUNTER — Inpatient Hospital Stay (HOSPITAL_BASED_OUTPATIENT_CLINIC_OR_DEPARTMENT_OTHER): Admitting: Internal Medicine

## 2024-01-12 ENCOUNTER — Inpatient Hospital Stay: Attending: Internal Medicine

## 2024-01-12 VITALS — BP 149/68 | HR 74 | Temp 96.1°F | Resp 17 | Ht 60.0 in | Wt 115.6 lb

## 2024-01-12 DIAGNOSIS — N189 Chronic kidney disease, unspecified: Secondary | ICD-10-CM | POA: Diagnosis not present

## 2024-01-12 DIAGNOSIS — E611 Iron deficiency: Secondary | ICD-10-CM | POA: Diagnosis not present

## 2024-01-12 DIAGNOSIS — K648 Other hemorrhoids: Secondary | ICD-10-CM | POA: Insufficient documentation

## 2024-01-12 DIAGNOSIS — D649 Anemia, unspecified: Secondary | ICD-10-CM | POA: Diagnosis not present

## 2024-01-12 DIAGNOSIS — D539 Nutritional anemia, unspecified: Secondary | ICD-10-CM

## 2024-01-12 LAB — CBC WITH DIFFERENTIAL (CANCER CENTER ONLY)
Abs Immature Granulocytes: 0.01 K/uL (ref 0.00–0.07)
Basophils Absolute: 0.1 K/uL (ref 0.0–0.1)
Basophils Relative: 2 %
Eosinophils Absolute: 0.1 K/uL (ref 0.0–0.5)
Eosinophils Relative: 1 %
HCT: 30.9 % — ABNORMAL LOW (ref 36.0–46.0)
Hemoglobin: 10.4 g/dL — ABNORMAL LOW (ref 12.0–15.0)
Immature Granulocytes: 0 %
Lymphocytes Relative: 18 %
Lymphs Abs: 1 K/uL (ref 0.7–4.0)
MCH: 28.9 pg (ref 26.0–34.0)
MCHC: 33.7 g/dL (ref 30.0–36.0)
MCV: 85.8 fL (ref 80.0–100.0)
Monocytes Absolute: 0.7 K/uL (ref 0.1–1.0)
Monocytes Relative: 12 %
Neutro Abs: 3.8 K/uL (ref 1.7–7.7)
Neutrophils Relative %: 67 %
Platelet Count: 119 K/uL — ABNORMAL LOW (ref 150–400)
RBC: 3.6 MIL/uL — ABNORMAL LOW (ref 3.87–5.11)
RDW: 22.9 % — ABNORMAL HIGH (ref 11.5–15.5)
WBC Count: 5.7 K/uL (ref 4.0–10.5)
nRBC: 0.4 % — ABNORMAL HIGH (ref 0.0–0.2)

## 2024-01-12 LAB — FERRITIN: Ferritin: 110 ng/mL (ref 11–307)

## 2024-01-12 LAB — IRON AND IRON BINDING CAPACITY (CC-WL,HP ONLY)
Iron: 60 ug/dL (ref 28–170)
Saturation Ratios: 26 % (ref 10.4–31.8)
TIBC: 232 ug/dL — ABNORMAL LOW (ref 250–450)
UIBC: 172 ug/dL (ref 148–442)

## 2024-01-12 LAB — FOLATE: Folate: 17.3 ng/mL (ref >5.9)

## 2024-01-12 LAB — VITAMIN B12: Vitamin B-12: 1604 pg/mL — ABNORMAL HIGH (ref 180–914)

## 2024-01-12 NOTE — Progress Notes (Signed)
 [facility] Telephone:([phone]) [phone]   [phone]  OFFICE PROGRESS NOTE  Larnell Hamilton, MD [address] [place] [address]  DIAGNOSIS: Persistent mild anemia likely anemia of chronic disease secondary to renal insufficiency in addition to iron deficiency from history of gastrointestinal blood loss and recent Hemoccult positive stool.   PRIOR THERAPY: None  CURRENT THERAPY: Over-the-counter ferrous sulfate 325 mg p.o. daily with orange juice.  INTERVAL HISTORY: Judith Boone 89 y.o. female returns to the clinic today for 61-month follow-up visit. Discussed the use of AI scribe software for clinical note transcription with the patient, who gave verbal consent to proceed.  History of Present Illness Judith Boone is a 89 year old female with mild anemia and secondary urinary insufficiency who presents for evaluation and repeat blood work.  She has a history of mild anemia, likely anemia of chronic disease, and secondary urinary insufficiency, in addition to iron deficiency from bleeding internal hemorrhoids. She is currently taking over-the-counter ferrous sulfate 325 mg daily.  She feels 'very good' and notes that her fatigue has improved, stating she is 'not so much' tired anymore. No blood in stool recently. A colonoscopy performed last year revealed internal hemorrhoids but nothing else concerning. Her hemoglobin level has improved from 9.1 in March to 10.4 currently.  No side effects from the iron tablets, such as nausea, vomiting, or constipation.    MEDICAL HISTORY: Past Medical History:  Diagnosis Date   Arthritis    Basal cell carcinoma    on  her face   Blood transfusion without reported diagnosis 2022   Cataract    bilateral sx   Cellulitis of left leg 07/28/2014   hospitalized for 4 days   Chronic kidney disease    COPD (chronic obstructive pulmonary disease) (HCC)    Dysrhythmia    one episode of a-fib no other issues   Emphysema  of lung (HCC)    Esophageal web    GERD (gastroesophageal reflux disease)    History of granulomatous disease    History of hiatal hernia    Hyperlipidemia    on meds   Hypertension    on meds   Iron deficiency anemia    Left leg swelling    Paroxysmal atrial fibrillation (HCC)    Pneumonia    2022   Right-sided low back pain with sciatica    Seasonal allergies    Sepsis (HCC) 08/17/2014    ALLERGIES:  is allergic to bacitracin, neosporin [neomycin-bacitracin zn-polymyx], and polysporin [bacitracin-polymyxin b].  MEDICATIONS:  Current Outpatient Medications  Medication Sig Dispense Refill   amLODipine  (NORVASC ) 5 MG tablet Take 5 mg by mouth at bedtime.   5   ANORO ELLIPTA  62.5-25 MCG/INH AEPB Inhale 1 puff into the lungs daily.     Ascorbic Acid  (VITAMIN C  PO) Take 1 tablet by mouth daily.     atorvastatin  (LIPITOR) 20 MG tablet Take 20 mg by mouth at bedtime.      Calcium  Citrate-Vitamin D  (CALCIUM  + D PO) Take 1 tablet by mouth 2 (two) times daily.     cetirizine (ZYRTEC) 10 MG tablet Take 10 mg by mouth daily as needed for allergies.     Cholecalciferol 25 MCG (1000 UT) capsule Take 1,000 Units by mouth daily.     ferrous sulfate 325 (65 FE) MG tablet Take 325 mg by mouth daily.     hydrALAZINE  (APRESOLINE ) 25 MG tablet Take 25 mg by mouth 2 (two) times  daily.      metoprolol  succinate (TOPROL -XL) 25 MG 24 hr tablet Take 1 tablet (25 mg total) by mouth daily. 90 tablet 2   montelukast  (SINGULAIR ) 10 MG tablet Take 10 mg by mouth daily.     Multiple Vitamin (MULTIVITAMIN) tablet Take 1 tablet by mouth daily.     omeprazole  (PRILOSEC) 40 MG capsule Take 40 mg by mouth in the morning and at bedtime.     OXYGEN  Inhale into the lungs. As needed     pramipexole  (MIRAPEX ) 0.25 MG tablet Take 0.25 mg by mouth at bedtime.     Spacer/Aero-Holding Chambers (AEROCHAMBER MV) inhaler Use as instructed with Albuterol  HFA 1 each 0   VENTOLIN  HFA 108 (90 BASE) MCG/ACT inhaler Inhale 1-2  puffs into the lungs every 6 (six) hours as needed for wheezing.   3   No current facility-administered medications for this visit.    SURGICAL HISTORY:  Past Surgical History:  Procedure Laterality Date   ABDOMINAL HYSTERECTOMY  2000   ANKLE SURGERY Left 1996   APPENDECTOMY  1978   BALLOON DILATION N/A 07/29/2021   Procedure: BALLOON DILATION;  Surgeon: Avram Lupita BRAVO, MD;  Location: [facility];  Service: Endoscopy;  Laterality: N/A;   BIOPSY  07/29/2021   Procedure: BIOPSY;  Surgeon: Avram Lupita BRAVO, MD;  Location: [facility];  Service: Endoscopy;;   CATARACT EXTRACTION Bilateral 2013   COLONOSCOPY WITH PROPOFOL  N/A 12/30/2022   Procedure: COLONOSCOPY WITH PROPOFOL ;  Surgeon: Avram Lupita BRAVO, MD;  Location: [facility];  Service: Gastroenterology;  Laterality: N/A;   ESOPHAGOGASTRODUODENOSCOPY (EGD) WITH PROPOFOL  N/A 07/29/2021   Procedure: ESOPHAGOGASTRODUODENOSCOPY (EGD) WITH PROPOFOL ;  Surgeon: Avram Lupita BRAVO, MD;  Location: [facility];  Service: Endoscopy;  Laterality: N/A;   TOTAL KNEE ARTHROPLASTY Right 2008   TOTAL KNEE ARTHROPLASTY Left 2012    REVIEW OF SYSTEMS:  A comprehensive review of systems was negative.   PHYSICAL EXAMINATION: General appearance: alert, cooperative, and no distress Head: Normocephalic, without obvious abnormality, atraumatic Neck: no adenopathy, no JVD, supple, symmetrical, trachea midline, and thyroid  not enlarged, symmetric, no tenderness/mass/nodules Lymph nodes: Cervical, supraclavicular, and axillary nodes normal. Resp: clear to auscultation bilaterally Back: symmetric, no curvature. ROM normal. No CVA tenderness. Cardio: regular rate and rhythm, S1, S2 normal, no murmur, click, rub or gallop GI: soft, non-tender; bowel sounds normal; no masses,  no organomegaly Extremities: extremities normal, atraumatic, no cyanosis or edema  ECOG PERFORMANCE STATUS: 1 - Symptomatic but completely ambulatory  Blood pressure (!) 149/68, pulse 74,  temperature (!) 96.1 F (35.6 C), resp. rate 17, height 5' (1.524 m), weight 115 lb 9.6 oz (52.4 kg).  LABORATORY DATA: Lab Results  Component Value Date   WBC 5.7 01/12/2024   HGB 10.4 (L) 01/12/2024   HCT 30.9 (L) 01/12/2024   MCV 85.8 01/12/2024   PLT 119 (L) 01/12/2024      Chemistry      Component Value Date/Time   NA 141 12/17/2023 2230   K 4.8 12/17/2023 2230   CL 108 12/17/2023 2230   CO2 20 (L) 12/17/2023 2230   BUN 36 (H) 12/17/2023 2230   CREATININE 1.35 (H) 12/17/2023 2230   CREATININE 1.67 (H) 10/18/2022 1042   CREATININE 1.17 (H) 09/21/2012 0953      Component Value Date/Time   CALCIUM  8.7 (L) 12/17/2023 2230   ALKPHOS 105 07/22/2023 1926   AST 25 07/22/2023 1926   AST 17 10/18/2022 1042   ALT 18 07/22/2023 1926   ALT 12  10/18/2022 1042   BILITOT 0.8 07/22/2023 1926   BILITOT 0.6 10/18/2022 1042       RADIOGRAPHIC STUDIES: CT Cervical Spine Wo Contrast Result Date: 12/17/2023 CLINICAL DATA:  89 year old post fall in the bathroom. EXAM: CT CERVICAL SPINE WITHOUT CONTRAST TECHNIQUE: Multidetector CT imaging of the cervical spine was performed without intravenous contrast. Multiplanar CT image reconstructions were also generated. RADIATION DOSE REDUCTION: This exam was performed according to the departmental dose-optimization program which includes automated exposure control, adjustment of the mA and/or kV according to patient size and/or use of iterative reconstruction technique. COMPARISON:  None Available. FINDINGS: Alignment: No evidence of traumatic subluxation. 2 mm anterolisthesis of C2 on C3, 3 mm anterolisthesis of C3 on C4 and 4 mm anterolisthesis of C6 on C7. Skull base and vertebrae: No acute fracture. Vertebral body heights are maintained. The dens and skull base are intact. There is an 8 mm lucency involving the posterior aspect of the dens with overlying loss of cortex. Moderate C1-C2 pannus. Soft tissues and spinal canal: No prevertebral fluid or  swelling. No visible canal hematoma. Disc levels: Advanced bilateral facet hypertrophy with moderate to advanced diffuse degenerative disc disease. Spinal canal stenosis at C4-C5. Upper chest: Emphysema.  No acute findings. Other: Carotid calcifications. IMPRESSION: 1. No acute fracture or traumatic subluxation of the cervical spine. 2. Advanced multilevel degenerative disc disease and facet hypertrophy. Degenerative anterolisthesis C2 on C3, C3 on C4, and C6 on C7. 3. An 8 mm lucency involving the posterior aspect of the dens with overlying loss of cortex. This is nonspecific and may be degenerative in nature, however a lytic lesion is not excluded. Recommend correlation for any history of malignancy. Consider MRI as clinically indicated. Electronically Signed   By: Andrea Gasman M.D.   On: 12/17/2023 23:33   DG Forearm Left Result Date: 12/17/2023 CLINICAL DATA:  Fall with bruising to left forearm EXAM: LEFT FOREARM - 2 VIEW COMPARISON:  None Available. FINDINGS: There is no evidence of fracture or other focal bone lesions. Soft tissues are unremarkable. IMPRESSION: Negative. Electronically Signed   By: Norman Gatlin M.D.   On: 12/17/2023 23:29   DG Knee Complete 4 Views Right Result Date: 12/17/2023 CLINICAL DATA:  Fall, pain. Fell in the bathroom. Laceration to right knee EXAM: RIGHT KNEE - COMPLETE 4+ VIEW COMPARISON:  10/28/2016 FINDINGS: Right TKA. No radiographic evidence of loosening. No acute fracture or dislocation. Soft tissues are unremarkable. IMPRESSION: RIGHT TKA without radiographic evidence of loosening. No acute fracture. Electronically Signed   By: Norman Gatlin M.D.   On: 12/17/2023 23:28   CT Head Wo Contrast Result Date: 12/17/2023 CLINICAL DATA:  89 year old post fall in the bathroom. EXAM: CT HEAD WITHOUT CONTRAST TECHNIQUE: Contiguous axial images were obtained from the base of the skull through the vertex without intravenous contrast. RADIATION DOSE REDUCTION: This exam was  performed according to the departmental dose-optimization program which includes automated exposure control, adjustment of the mA and/or kV according to patient size and/or use of iterative reconstruction technique. COMPARISON:  None Available. FINDINGS: Brain: No intracranial hemorrhage, mass effect, or midline shift. Age related atrophy and chronic small vessel ischemia. No hydrocephalus. The basilar cisterns are patent. No evidence of territorial infarct or acute ischemia. Enlarged partially empty sella. No extra-axial or intracranial fluid collection. Vascular: Atherosclerosis of skullbase vasculature without hyperdense vessel or abnormal calcification. Skull: No fracture or focal lesion. Sinuses/Orbits: No acute findings. Minor mucosal thickening of paranasal sinuses. Other: Midline frontal scalp laceration and hematoma.  IMPRESSION: 1. Midline frontal scalp laceration and hematoma. No acute intracranial abnormality. No skull fracture. 2. Age related atrophy and chronic small vessel ischemia. Electronically Signed   By: Andrea Gasman M.D.   On: 12/17/2023 23:28   DG Thoracic Spine 2 View Result Date: 12/17/2023 CLINICAL DATA:  Fall, pain, tripped and fell in the bathroom. Laceration to head and right knee EXAM: LUMBAR SPINE - 2-3 VIEW; THORACIC SPINE 2 VIEWS COMPARISON:  Chest radiograph 07/04/2023 and CT 03/19/2022 FINDINGS: Demineralization and cross-table positioning limits assessment of the spine for subtle fractures. No evidence of acute fracture or traumatic listhesis. Left convex thoracolumbar curve. Multilevel spondylosis, disc space height loss, degenerative endplate changes greatest in the lumbar spine where it is advanced. IMPRESSION: No evidence of acute fracture or traumatic listhesis in the thoracolumbar spine on exam limited due to positioning. Electronically Signed   By: Norman Gatlin M.D.   On: 12/17/2023 23:28   DG Lumbar Spine 2-3 Views Result Date: 12/17/2023 CLINICAL DATA:  Fall,  pain, tripped and fell in the bathroom. Laceration to head and right knee EXAM: LUMBAR SPINE - 2-3 VIEW; THORACIC SPINE 2 VIEWS COMPARISON:  Chest radiograph 07/04/2023 and CT 03/19/2022 FINDINGS: Demineralization and cross-table positioning limits assessment of the spine for subtle fractures. No evidence of acute fracture or traumatic listhesis. Left convex thoracolumbar curve. Multilevel spondylosis, disc space height loss, degenerative endplate changes greatest in the lumbar spine where it is advanced. IMPRESSION: No evidence of acute fracture or traumatic listhesis in the thoracolumbar spine on exam limited due to positioning. Electronically Signed   By: Norman Gatlin M.D.   On: 12/17/2023 23:28    ASSESSMENT AND PLAN: This is a very pleasant 89 years old white female with Persistent mild anemia likely anemia of chronic disease secondary to renal insufficiency in addition to iron deficiency from history of gastrointestinal blood loss and recent Hemoccult positive stool.  The patient is currently on over-the-counter ferrous sulfate with orange juice once daily and tolerating it fairly well. Assessment and Plan Assessment & Plan Anemia due to chronic kidney disease and iron deficiency secondary to internal hemorrhoids Anemia is improving with current treatment. Hemoglobin has increased from 9.1 in March to 10.4. Anemia is likely due to chronic kidney disease and iron deficiency from prior bleeding internal hemorrhoids. No current bleeding reported. Iron supplementation is well tolerated without side effects such as nausea or constipation. - Continue ferrous sulfate 325 mg daily - Review iron study, B12, and folic acid  results when available - Contact her if there are concerning results  Chronic kidney disease Chronic kidney disease is contributing to anemia. No acute changes discussed.  Internal hemorrhoids with prior bleeding Internal hemorrhoids previously caused bleeding, contributing to iron  deficiency anemia. No current bleeding reported.  Follow-up Follow-up plan discussed based on lab results. - Schedule follow-up appointment in 6 months if lab results are normal - Contact her if lab results are concerning She was advised to call immediately if she has any other concerning symptoms in the interval.  The patient voices understanding of current disease status and treatment options and is in agreement with the current care plan.  All questions were answered. The patient knows to call the clinic with any problems, questions or concerns. We can certainly see the patient much sooner if necessary.  The total time spent in the appointment was 20 minutes.  Disclaimer: This note was dictated with voice recognition software. Similar sounding words can inadvertently be transcribed and may not be  corrected upon review.

## 2024-01-15 DIAGNOSIS — M6281 Muscle weakness (generalized): Secondary | ICD-10-CM | POA: Diagnosis not present

## 2024-01-19 DIAGNOSIS — M6281 Muscle weakness (generalized): Secondary | ICD-10-CM | POA: Diagnosis not present

## 2024-01-21 DIAGNOSIS — M6281 Muscle weakness (generalized): Secondary | ICD-10-CM | POA: Diagnosis not present

## 2024-01-29 ENCOUNTER — Ambulatory Visit
Admission: RE | Admit: 2024-01-29 | Discharge: 2024-01-29 | Disposition: A | Discharge status: Home / Self Care | Source: Ambulatory Visit | Attending: Internal Medicine | Admitting: Internal Medicine

## 2024-01-29 DIAGNOSIS — R9389 Abnormal findings on diagnostic imaging of other specified body structures: Secondary | ICD-10-CM

## 2024-01-29 DIAGNOSIS — M4312 Spondylolisthesis, cervical region: Secondary | ICD-10-CM | POA: Diagnosis not present

## 2024-01-29 DIAGNOSIS — M47812 Spondylosis without myelopathy or radiculopathy, cervical region: Secondary | ICD-10-CM | POA: Diagnosis not present

## 2024-01-29 MED ORDER — GADOPICLENOL 0.5 MMOL/ML IV SOLN
5.0000 mL | Freq: Once | INTRAVENOUS | Status: AC | PRN
  Administered 2024-01-29: 5 mL via INTRAVENOUS

## 2024-02-02 DIAGNOSIS — M6281 Muscle weakness (generalized): Secondary | ICD-10-CM | POA: Diagnosis not present

## 2024-02-02 DIAGNOSIS — R293 Abnormal posture: Secondary | ICD-10-CM | POA: Diagnosis not present

## 2024-02-02 DIAGNOSIS — R2681 Unsteadiness on feet: Secondary | ICD-10-CM | POA: Diagnosis not present

## 2024-02-05 DIAGNOSIS — R293 Abnormal posture: Secondary | ICD-10-CM | POA: Diagnosis not present

## 2024-02-05 DIAGNOSIS — M6281 Muscle weakness (generalized): Secondary | ICD-10-CM | POA: Diagnosis not present

## 2024-02-05 DIAGNOSIS — R2681 Unsteadiness on feet: Secondary | ICD-10-CM | POA: Diagnosis not present

## 2024-02-09 DIAGNOSIS — H353211 Exudative age-related macular degeneration, right eye, with active choroidal neovascularization: Secondary | ICD-10-CM | POA: Diagnosis not present

## 2024-03-14 DIAGNOSIS — E611 Iron deficiency: Secondary | ICD-10-CM | POA: Diagnosis not present

## 2024-03-14 DIAGNOSIS — E785 Hyperlipidemia, unspecified: Secondary | ICD-10-CM | POA: Diagnosis not present

## 2024-03-21 DIAGNOSIS — R82998 Other abnormal findings in urine: Secondary | ICD-10-CM | POA: Diagnosis not present

## 2024-05-03 ENCOUNTER — Observation Stay (HOSPITAL_BASED_OUTPATIENT_CLINIC_OR_DEPARTMENT_OTHER)
Admission: EM | Admit: 2024-05-03 | Discharge: 2024-05-04 | Disposition: A | Discharge status: Home / Self Care | Attending: Family Medicine | Admitting: Family Medicine

## 2024-05-03 ENCOUNTER — Emergency Department (HOSPITAL_BASED_OUTPATIENT_CLINIC_OR_DEPARTMENT_OTHER)

## 2024-05-03 ENCOUNTER — Other Ambulatory Visit: Payer: Self-pay

## 2024-05-03 ENCOUNTER — Encounter (HOSPITAL_BASED_OUTPATIENT_CLINIC_OR_DEPARTMENT_OTHER): Payer: Self-pay | Admitting: Emergency Medicine

## 2024-05-03 DIAGNOSIS — D696 Thrombocytopenia, unspecified: Secondary | ICD-10-CM | POA: Diagnosis not present

## 2024-05-03 DIAGNOSIS — Z87891 Personal history of nicotine dependence: Secondary | ICD-10-CM | POA: Insufficient documentation

## 2024-05-03 DIAGNOSIS — Z79899 Other long term (current) drug therapy: Secondary | ICD-10-CM | POA: Diagnosis not present

## 2024-05-03 DIAGNOSIS — G2581 Restless legs syndrome: Secondary | ICD-10-CM | POA: Insufficient documentation

## 2024-05-03 DIAGNOSIS — R000 Tachycardia, unspecified: Secondary | ICD-10-CM | POA: Diagnosis not present

## 2024-05-03 DIAGNOSIS — N183 Chronic kidney disease, stage 3 unspecified: Secondary | ICD-10-CM | POA: Diagnosis not present

## 2024-05-03 DIAGNOSIS — J441 Chronic obstructive pulmonary disease with (acute) exacerbation: Secondary | ICD-10-CM | POA: Diagnosis present

## 2024-05-03 DIAGNOSIS — I129 Hypertensive chronic kidney disease with stage 1 through stage 4 chronic kidney disease, or unspecified chronic kidney disease: Secondary | ICD-10-CM | POA: Insufficient documentation

## 2024-05-03 DIAGNOSIS — E7800 Pure hypercholesterolemia, unspecified: Secondary | ICD-10-CM | POA: Insufficient documentation

## 2024-05-03 DIAGNOSIS — Z85828 Personal history of other malignant neoplasm of skin: Secondary | ICD-10-CM | POA: Diagnosis not present

## 2024-05-03 DIAGNOSIS — R0602 Shortness of breath: Secondary | ICD-10-CM | POA: Diagnosis not present

## 2024-05-03 DIAGNOSIS — D649 Anemia, unspecified: Secondary | ICD-10-CM | POA: Diagnosis not present

## 2024-05-03 DIAGNOSIS — I10 Essential (primary) hypertension: Secondary | ICD-10-CM | POA: Diagnosis not present

## 2024-05-03 DIAGNOSIS — J9601 Acute respiratory failure with hypoxia: Principal | ICD-10-CM

## 2024-05-03 DIAGNOSIS — Z96653 Presence of artificial knee joint, bilateral: Secondary | ICD-10-CM | POA: Insufficient documentation

## 2024-05-03 DIAGNOSIS — Z743 Need for continuous supervision: Secondary | ICD-10-CM | POA: Diagnosis not present

## 2024-05-03 DIAGNOSIS — K219 Gastro-esophageal reflux disease without esophagitis: Secondary | ICD-10-CM | POA: Diagnosis present

## 2024-05-03 LAB — CBC
HCT: 29.3 % — ABNORMAL LOW (ref 36.0–46.0)
Hemoglobin: 9.7 g/dL — ABNORMAL LOW (ref 12.0–15.0)
MCH: 29.8 pg (ref 26.0–34.0)
MCHC: 33.1 g/dL (ref 30.0–36.0)
MCV: 89.9 fL (ref 80.0–100.0)
Platelets: 91 K/uL — ABNORMAL LOW (ref 150–400)
RBC: 3.26 MIL/uL — ABNORMAL LOW (ref 3.87–5.11)
RDW: 21.6 % — ABNORMAL HIGH (ref 11.5–15.5)
WBC: 5.8 K/uL (ref 4.0–10.5)
nRBC: 2.1 % — ABNORMAL HIGH (ref 0.0–0.2)

## 2024-05-03 LAB — BASIC METABOLIC PANEL WITH GFR
Anion gap: 13 (ref 5–15)
BUN: 26 mg/dL — ABNORMAL HIGH (ref 8–23)
CO2: 21 mmol/L — ABNORMAL LOW (ref 22–32)
Calcium: 8.8 mg/dL — ABNORMAL LOW (ref 8.9–10.3)
Chloride: 108 mmol/L (ref 98–111)
Creatinine, Ser: 1.38 mg/dL — ABNORMAL HIGH (ref 0.44–1.00)
GFR, Estimated: 36 mL/min — ABNORMAL LOW
Glucose, Bld: 92 mg/dL (ref 70–99)
Potassium: 4.1 mmol/L (ref 3.5–5.1)
Sodium: 142 mmol/L (ref 135–145)

## 2024-05-03 LAB — RESP PANEL BY RT-PCR (RSV, FLU A&B, COVID)  RVPGX2
Influenza A by PCR: NEGATIVE
Influenza B by PCR: NEGATIVE
Resp Syncytial Virus by PCR: NEGATIVE
SARS Coronavirus 2 by RT PCR: NEGATIVE

## 2024-05-03 LAB — HEPATIC FUNCTION PANEL
ALT: 16 U/L (ref 0–44)
AST: 23 U/L (ref 15–41)
Albumin: 3.8 g/dL (ref 3.5–5.0)
Alkaline Phosphatase: 119 U/L (ref 38–126)
Bilirubin, Direct: 0.2 mg/dL (ref 0.0–0.2)
Indirect Bilirubin: 0.2 mg/dL — ABNORMAL LOW (ref 0.3–0.9)
Total Bilirubin: 0.4 mg/dL (ref 0.0–1.2)
Total Protein: 6.8 g/dL (ref 6.5–8.1)

## 2024-05-03 LAB — PRO BRAIN NATRIURETIC PEPTIDE: Pro Brain Natriuretic Peptide: 2384 pg/mL — ABNORMAL HIGH

## 2024-05-03 MED ORDER — FERROUS SULFATE 325 (65 FE) MG PO TABS
325.0000 mg | ORAL_TABLET | Freq: Every day | ORAL | Status: DC
  Administered 2024-05-04: 325 mg via ORAL
  Filled 2024-05-03: qty 1

## 2024-05-03 MED ORDER — PREDNISONE 20 MG PO TABS: 40.0000 mg | ORAL_TABLET | Freq: Every day | ORAL | Status: DC

## 2024-05-03 MED ORDER — ACETAMINOPHEN 650 MG RE SUPP: 650.0000 mg | Freq: Four times a day (QID) | RECTAL | Status: DC | PRN

## 2024-05-03 MED ORDER — IPRATROPIUM-ALBUTEROL 0.5-2.5 (3) MG/3ML IN SOLN
3.0000 mL | RESPIRATORY_TRACT | Status: AC
  Administered 2024-05-03: 3 mL via RESPIRATORY_TRACT
  Filled 2024-05-03: qty 3

## 2024-05-03 MED ORDER — BUDESON-GLYCOPYRROL-FORMOTEROL 160-9-4.8 MCG/ACT IN AERO
2.0000 | INHALATION_SPRAY | Freq: Two times a day (BID) | RESPIRATORY_TRACT | Status: DC
  Filled 2024-05-03: qty 5.9

## 2024-05-03 MED ORDER — IPRATROPIUM-ALBUTEROL 0.5-2.5 (3) MG/3ML IN SOLN
3.0000 mL | RESPIRATORY_TRACT | Status: DC
  Administered 2024-05-03: 3 mL via RESPIRATORY_TRACT
  Filled 2024-05-03: qty 3

## 2024-05-03 MED ORDER — LORATADINE 10 MG PO TABS
10.0000 mg | ORAL_TABLET | Freq: Every day | ORAL | Status: DC
  Administered 2024-05-04: 10 mg via ORAL
  Filled 2024-05-03 (×2): qty 1

## 2024-05-03 MED ORDER — UMECLIDINIUM-VILANTEROL 62.5-25 MCG/ACT IN AEPB: 1.0000 | INHALATION_SPRAY | Freq: Every day | RESPIRATORY_TRACT | Status: DC

## 2024-05-03 MED ORDER — ATORVASTATIN CALCIUM 10 MG PO TABS
20.0000 mg | ORAL_TABLET | Freq: Every day | ORAL | Status: DC
  Administered 2024-05-03: 20 mg via ORAL
  Filled 2024-05-03: qty 2

## 2024-05-03 MED ORDER — PRAMIPEXOLE DIHYDROCHLORIDE 0.25 MG PO TABS
0.2500 mg | ORAL_TABLET | Freq: Two times a day (BID) | ORAL | Status: DC
  Administered 2024-05-03 – 2024-05-04 (×2): 0.25 mg via ORAL
  Filled 2024-05-03 (×2): qty 1

## 2024-05-03 MED ORDER — ACETAMINOPHEN 325 MG PO TABS: 650.0000 mg | ORAL_TABLET | Freq: Four times a day (QID) | ORAL | Status: DC | PRN

## 2024-05-03 MED ORDER — PANTOPRAZOLE SODIUM 40 MG PO TBEC
40.0000 mg | DELAYED_RELEASE_TABLET | Freq: Every day | ORAL | Status: DC
  Filled 2024-05-03: qty 1

## 2024-05-03 MED ORDER — HYDRALAZINE HCL 25 MG PO TABS
25.0000 mg | ORAL_TABLET | Freq: Two times a day (BID) | ORAL | Status: DC
  Administered 2024-05-03 – 2024-05-04 (×2): 25 mg via ORAL
  Filled 2024-05-03 (×3): qty 1

## 2024-05-03 MED ORDER — MONTELUKAST SODIUM 10 MG PO TABS
10.0000 mg | ORAL_TABLET | Freq: Every day | ORAL | Status: DC
  Administered 2024-05-03: 10 mg via ORAL
  Filled 2024-05-03: qty 1

## 2024-05-03 MED ORDER — HEPARIN SODIUM (PORCINE) 5000 UNIT/ML IJ SOLN
5000.0000 [IU] | Freq: Three times a day (TID) | INTRAMUSCULAR | Status: DC
  Administered 2024-05-03 – 2024-05-04 (×2): 5000 [IU] via SUBCUTANEOUS
  Filled 2024-05-03 (×2): qty 1

## 2024-05-03 MED ORDER — IPRATROPIUM-ALBUTEROL 0.5-2.5 (3) MG/3ML IN SOLN: 3.0000 mL | RESPIRATORY_TRACT | Status: DC | PRN

## 2024-05-03 MED ORDER — AMLODIPINE BESYLATE 5 MG PO TABS
5.0000 mg | ORAL_TABLET | Freq: Every day | ORAL | Status: DC
  Administered 2024-05-03: 5 mg via ORAL
  Filled 2024-05-03: qty 1

## 2024-05-03 MED ORDER — DOXYCYCLINE HYCLATE 100 MG PO TABS
100.0000 mg | ORAL_TABLET | Freq: Two times a day (BID) | ORAL | Status: DC
  Administered 2024-05-03 – 2024-05-04 (×2): 100 mg via ORAL
  Filled 2024-05-03 (×2): qty 1

## 2024-05-03 MED ORDER — METHYLPREDNISOLONE SODIUM SUCC 40 MG IJ SOLR
40.0000 mg | Freq: Two times a day (BID) | INTRAMUSCULAR | Status: DC
  Administered 2024-05-04: 40 mg via INTRAVENOUS
  Filled 2024-05-03: qty 1

## 2024-05-03 MED ORDER — METOPROLOL SUCCINATE ER 25 MG PO TB24
25.0000 mg | ORAL_TABLET | Freq: Every day | ORAL | Status: DC
  Administered 2024-05-04: 25 mg via ORAL
  Filled 2024-05-03 (×2): qty 1

## 2024-05-03 MED ORDER — IPRATROPIUM-ALBUTEROL 0.5-2.5 (3) MG/3ML IN SOLN
3.0000 mL | Freq: Three times a day (TID) | RESPIRATORY_TRACT | Status: DC
  Administered 2024-05-04: 3 mL via RESPIRATORY_TRACT
  Filled 2024-05-03 (×2): qty 3

## 2024-05-03 MED ORDER — METHYLPREDNISOLONE SODIUM SUCC 125 MG IJ SOLR
125.0000 mg | Freq: Two times a day (BID) | INTRAMUSCULAR | Status: AC
  Administered 2024-05-03 – 2024-05-04 (×2): 125 mg via INTRAVENOUS
  Filled 2024-05-03 (×2): qty 2

## 2024-05-03 MED ORDER — PANTOPRAZOLE SODIUM 40 MG PO TBEC
40.0000 mg | DELAYED_RELEASE_TABLET | Freq: Two times a day (BID) | ORAL | Status: DC
  Administered 2024-05-03 – 2024-05-04 (×2): 40 mg via ORAL
  Filled 2024-05-03 (×2): qty 1

## 2024-05-03 MED ORDER — MONTELUKAST SODIUM 10 MG PO TABS: 10.0000 mg | ORAL_TABLET | Freq: Every day | ORAL | Status: DC

## 2024-05-03 NOTE — ED Notes (Signed)
 Called [facility] for transport to [facility] @17 :32.  Spoke with.Debby

## 2024-05-03 NOTE — H&P (Signed)
 " History and Physical    Judith Boone FMW:[id number] DOB: [birth year] DOA: 05/03/2024  Patient coming from: Independent living facility.  Chief Complaint: Shortness of breath.  HPI: Judith Boone is a 89 y.o. female with history of COPD on 2 L oxygen  at baseline, hypertension, chronic disease stage III, chronic anemia, GERD, hyperlipidemia, restless leg syndrome, prior history of paroxysmal atrial fibrillation not on anticoagulation secondary to history of anemia and no recurrent episodes brought to the ER because of shortness of breath.  Patient states over the last 3 to 4 days patient has been having wheezing shortness of breath on exertion and also has been having some productive cough with discolored sputum.  Denies any chest pain.  Shortness of breath is mostly on exertion.  ED Course: In the ER patient's chest x-ray shows mild pleural effusion bilaterally.  proBNP was 2300 EKG shows normal sinus rhythm hemoglobin 9.7 platelets 91.  Patient was placed on IV steroids nebulizer and admitted for COPD exacerbation.  At the time of my exam patient on 2 L oxygen  which is at baseline.  Flu and COVID test were negative.  Review of Systems: As per HPI, rest all negative.   Past Medical History:  Diagnosis Date   Arthritis    Basal cell carcinoma    on  her face   Blood transfusion without reported diagnosis 2022   Cataract    bilateral sx   Cellulitis of left leg 07/28/2014   hospitalized for 4 days   Chronic kidney disease    COPD (chronic obstructive pulmonary disease) (HCC)    Dysrhythmia    one episode of a-fib no other issues   Emphysema of lung (HCC)    Esophageal web    GERD (gastroesophageal reflux disease)    History of granulomatous disease    History of hiatal hernia    Hyperlipidemia    on meds   Hypertension    on meds   Iron deficiency anemia    Left leg swelling    Paroxysmal atrial fibrillation (HCC)    Pneumonia    2022   Right-sided low back pain with sciatica     Seasonal allergies    Sepsis (HCC) 08/17/2014    Past Surgical History:  Procedure Laterality Date   ABDOMINAL HYSTERECTOMY  2000   ANKLE SURGERY Left 1996   APPENDECTOMY  1978   BALLOON DILATION N/A 07/29/2021   Procedure: BALLOON DILATION;  Surgeon: Avram Lupita BRAVO, MD;  Location: [facility];  Service: Endoscopy;  Laterality: N/A;   BIOPSY  07/29/2021   Procedure: BIOPSY;  Surgeon: Avram Lupita BRAVO, MD;  Location: [facility];  Service: Endoscopy;;   CATARACT EXTRACTION Bilateral 2013   COLONOSCOPY WITH PROPOFOL  N/A 12/30/2022   Procedure: COLONOSCOPY WITH PROPOFOL ;  Surgeon: Avram Lupita BRAVO, MD;  Location: [facility];  Service: Gastroenterology;  Laterality: N/A;   ESOPHAGOGASTRODUODENOSCOPY (EGD) WITH PROPOFOL  N/A 07/29/2021   Procedure: ESOPHAGOGASTRODUODENOSCOPY (EGD) WITH PROPOFOL ;  Surgeon: Avram Lupita BRAVO, MD;  Location: [facility];  Service: Endoscopy;  Laterality: N/A;   TOTAL KNEE ARTHROPLASTY Right 2008   TOTAL KNEE ARTHROPLASTY Left 2012     reports that she quit smoking about 45 years ago. Her smoking use included cigarettes. She started smoking about 75 years ago. She has a 60 pack-year smoking history. She has never used smokeless tobacco. She reports current alcohol  use of about 2.0 - 3.0 standard drinks of alcohol  per week. She reports that she does not use drugs.  Allergies[1]  Family History  Problem Relation Age of Onset   Alcohol  abuse Father    Heart disease Father    Hypertension Father    Hypertension Daughter    Hypertension Son    Asthma Son    Colon polyps Neg Hx    Colon cancer Neg Hx    Esophageal cancer Neg Hx    Rectal cancer Neg Hx    Stomach cancer Neg Hx     Prior to Admission medications  Medication Sig Start Date End Date Taking? Authorizing Provider  amLODipine  (NORVASC ) 5 MG tablet Take 5 mg by mouth at bedtime.  04/13/15   [provider]  ANORO ELLIPTA  62.5-25 MCG/INH AEPB Inhale 1 puff into the lungs daily. 07/04/18    [provider]  Ascorbic Acid  (VITAMIN C  PO) Take 1 tablet by mouth daily.    [provider]  atorvastatin  (LIPITOR) 20 MG tablet Take 20 mg by mouth at bedtime.     [provider]  Calcium  Citrate-Vitamin D  (CALCIUM  + D PO) Take 1 tablet by mouth 2 (two) times daily.    [provider]  cetirizine (ZYRTEC) 10 MG tablet Take 10 mg by mouth daily as needed for allergies.    [provider]  Cholecalciferol 25 MCG (1000 UT) capsule Take 1,000 Units by mouth daily. 04/28/17   [provider]  ferrous sulfate  325 (65 FE) MG tablet Take 325 mg by mouth daily.    [provider]  hydrALAZINE  (APRESOLINE ) 25 MG tablet Take 25 mg by mouth 2 (two) times daily.     [provider]  metoprolol  succinate (TOPROL -XL) 25 MG 24 hr tablet Take 1 tablet (25 mg total) by mouth daily. 07/23/18   Fenton, Clint R, PA  montelukast  (SINGULAIR ) 10 MG tablet Take 10 mg by mouth daily. 08/02/19   [provider]  Multiple Vitamin (MULTIVITAMIN) tablet Take 1 tablet by mouth daily.    [provider]  omeprazole  (PRILOSEC) 40 MG capsule Take 40 mg by mouth in the morning and at bedtime. 07/22/18   [provider]  OXYGEN  Inhale into the lungs. As needed    [provider]  pramipexole  (MIRAPEX ) 0.25 MG tablet Take 0.25 mg by mouth at bedtime. 02/04/21   [provider]  Spacer/Aero-Holding Chambers (AEROCHAMBER MV) inhaler Use as instructed with Albuterol  HFA 10/07/19   Parrett, Madelin RAMAN, NP  VENTOLIN  HFA 108 (90 BASE) MCG/ACT inhaler Inhale 1-2 puffs into the lungs every 6 (six) hours as needed for wheezing.  03/03/15   [provider]    Physical Exam: Constitutional: Moderately built and nourished. Vitals:   05/03/24 1600 05/03/24 1615 05/03/24 1630 05/03/24 1858  BP: (!) 145/69 (!) 144/67 (!) 156/80 (!) 140/63  Pulse: 76 75  74  Resp: 17 19 18 18   Temp:    98.6 F (37 C)  TempSrc:      SpO2:  93% 95%  95%  Weight:      Height:       Eyes: Anicteric no pallor. ENMT: No discharge from the ears eyes nose or mouth. Neck: No felt.  No neck rigidity. Respiratory: Mild expiratory wheeze or no crepitations. Cardiovascular: S1 S2 heard. Abdomen: Soft nontender bowel sound present. Musculoskeletal: No edema. Skin: No rash. Neurologic: Alert awake oriented to time place and person.  Moves all extremities. Psychiatric: Appears normal.  Normal affect.   Labs on Admission: I have personally reviewed following labs and imaging studies  CBC: Recent Labs  Lab 05/03/24 1145  WBC 5.8  HGB 9.7*  HCT 29.3*  MCV 89.9  PLT 91*   Basic Metabolic Panel: Recent Labs  Lab 05/03/24 1145  NA 142  K 4.1  CL 108  CO2 21*  GLUCOSE 92  BUN 26*  CREATININE 1.38*  CALCIUM  8.8*   GFR: Estimated Creatinine Clearance: 19.5 mL/min (A) (by C-G formula based on SCr of 1.38 mg/dL (H)). Liver Function Tests: Recent Labs  Lab 05/03/24 1301  AST 23  ALT 16  ALKPHOS 119  BILITOT 0.4  PROT 6.8  ALBUMIN 3.8   No results for input(s): LIPASE, AMYLASE in the last 168 hours. No results for input(s): AMMONIA in the last 168 hours. Coagulation Profile: No results for input(s): INR, PROTIME in the last 168 hours. Cardiac Enzymes: No results for input(s): CKTOTAL, CKMB, CKMBINDEX, TROPONINI in the last 168 hours. BNP (last 3 results) Recent Labs    05/03/24 1145  PROBNP 2,384.0*   HbA1C: No results for input(s): HGBA1C in the last 72 hours. CBG: No results for input(s): GLUCAP in the last 168 hours. Lipid Profile: No results for input(s): CHOL, HDL, LDLCALC, TRIG, CHOLHDL, LDLDIRECT in the last 72 hours. Thyroid  Function Tests: No results for input(s): TSH, T4TOTAL, FREET4, T3FREE, THYROIDAB in the last 72 hours. Anemia Panel: No results for input(s): VITAMINB12, FOLATE, FERRITIN, TIBC, IRON, RETICCTPCT in the last 72  hours. Urine analysis:    Component Value Date/Time   COLORURINE YELLOW 02/03/2019 1235   APPEARANCEUR CLEAR 02/03/2019 1235   LABSPEC 1.010 02/03/2019 1235   PHURINE 5.5 02/03/2019 1235   GLUCOSEU NEGATIVE 02/03/2019 1235   HGBUR NEGATIVE 02/03/2019 1235   BILIRUBINUR NEGATIVE 02/03/2019 1235   KETONESUR NEGATIVE 02/03/2019 1235   PROTEINUR NEGATIVE 02/03/2019 1235   UROBILINOGEN 0.2 08/17/2014 0000   NITRITE NEGATIVE 02/03/2019 1235   LEUKOCYTESUR NEGATIVE 02/03/2019 1235   Sepsis Labs: @LABRCNTIP (procalcitonin:4,lacticidven:4) ) Recent Results (from the past 240 hours)  Resp panel by RT-PCR (RSV, Flu A&B, Covid) Anterior Nasal Swab     Status: None   Collection Time: 05/03/24 12:40 PM   Specimen: Anterior Nasal Swab  Result Value Ref Range Status   SARS Coronavirus 2 by RT PCR NEGATIVE NEGATIVE Final    Comment: (NOTE) SARS-CoV-2 target nucleic acids are NOT DETECTED.  The SARS-CoV-2 RNA is generally detectable in upper respiratory specimens during the acute phase of infection. The lowest concentration of SARS-CoV-2 viral copies this assay can detect is 138 copies/mL. A negative result does not preclude SARS-Cov-2 infection and should not be used as the sole basis for treatment or other patient management decisions. A negative result may occur with  improper specimen collection/handling, submission of specimen other than nasopharyngeal swab, presence of viral mutation(s) within the areas targeted by this assay, and inadequate number of viral copies(<138 copies/mL). A negative result must be combined with clinical observations, patient history, and epidemiological information. The expected result is Negative.  Fact Sheet for Patients:  bloggercourse.com  Fact Sheet for Healthcare Providers:  seriousbroker.it  This test is no t yet approved or cleared by the [place]  FDA and  has been authorized for detection  and/or diagnosis of SARS-CoV-2 by FDA under an Emergency Use Authorization (EUA). This EUA will remain  in effect (meaning this test can be used) for the duration of the COVID-19 declaration under Section 564(b)(1) of the Act, 21 U.S.C.section 360bbb-3(b)(1), unless the authorization is terminated  or revoked sooner.       Influenza A by PCR  NEGATIVE NEGATIVE Final   Influenza B by PCR NEGATIVE NEGATIVE Final    Comment: (NOTE) The Xpert Xpress SARS-CoV-2/FLU/RSV plus assay is intended as an aid in the diagnosis of influenza from Nasopharyngeal swab specimens and should not be used as a sole basis for treatment. Nasal washings and aspirates are unacceptable for Xpert Xpress SARS-CoV-2/FLU/RSV testing.  Fact Sheet for Patients: bloggercourse.com  Fact Sheet for Healthcare Providers: seriousbroker.it  This test is not yet approved or cleared by the [place]  FDA and has been authorized for detection and/or diagnosis of SARS-CoV-2 by FDA under an Emergency Use Authorization (EUA). This EUA will remain in effect (meaning this test can be used) for the duration of the COVID-19 declaration under Section 564(b)(1) of the Act, 21 U.S.C. section 360bbb-3(b)(1), unless the authorization is terminated or revoked.     Resp Syncytial Virus by PCR NEGATIVE NEGATIVE Final    Comment: (NOTE) Fact Sheet for Patients: bloggercourse.com  Fact Sheet for Healthcare Providers: seriousbroker.it  This test is not yet approved or cleared by the [place]  FDA and has been authorized for detection and/or diagnosis of SARS-CoV-2 by FDA under an Emergency Use Authorization (EUA). This EUA will remain in effect (meaning this test can be used) for the duration of the COVID-19 declaration under Section 564(b)(1) of the Act, 21 U.S.C. section 360bbb-3(b)(1), unless the authorization is terminated  or revoked.  Performed at [facility], [address] [place], [place] [address]      Radiological Exams on Admission: DG Chest 2 View Result Date: 05/03/2024 CLINICAL DATA:  Cough and shortness of breath. EXAM: CHEST - 2 VIEW COMPARISON:  July 22, 2023 FINDINGS: The heart size and mediastinal contours are within normal limits. The lungs are hyperinflated. A stable subcentimeter calcified nodular opacity is seen overlying the lateral aspect of the mid to upper left lung with additional tiny, stable calcified nodular opacity is seen scattered throughout both lungs. Very small bilateral pleural effusions are seen. No focal consolidation or pneumothorax is identified. A small, stable hiatal hernia is noted. Multilevel degenerative changes are present throughout the thoracic spine with mild to moderate severity levoscoliosis of the lower thoracic spine and upper lumbar spine. IMPRESSION: 1. Very small bilateral pleural effusions. 2. Evidence of prior granulomatous disease. 3. Small, stable hiatal hernia. Electronically Signed   By: Suzen Dials M.D.   On: 05/03/2024 12:40    EKG: Independently reviewed.  Normal sinus rhythm.  Assessment/Plan Principal Problem:   COPD exacerbation (HCC) Active Problems:   HTN (hypertension)   Hypercholesteremia   CKD (chronic kidney disease) stage 3, GFR 30-59 ml/min (HCC)   Anemia    Acute COPD exacerbation presently on scheduled and as needed DuoNebs and IV steroids and antibiotics.  Suspect patient likely has a component of CHF given the exertional symptoms with pleural effusion will order 1 dose of IV Lasix .  Check 2D echo. Hypertension on hydralazine  metoprolol  and amlodipine . Chronic anemia and thrombocytopenia follow CBC. Hyperlipidemia on statins. Chronic kidney disease stage III creatinine at around baseline. History of restless leg syndrome on pramipexole . GERD on PPI. History of paroxysmal atrial fibrillation mentioned in the  chart and patient was not on any anticoagulation since patient was having anemia and only had 1 episode.  DVT prophylaxis: Heparin . Code Status: DNR. Family Communication: [place] with patient. Disposition Plan: Monitored bed. Consults called: None. Admission status: Observation.         [1]  Allergies Allergen Reactions   Bacitracin  Makes wound worse    Neosporin [Neomycin-Bacitracin Zn-Polymyx]     Makes wound worse    Polysporin [Bacitracin-Polymyxin B]     Makes wound worse    Tape Other (See Comments)    Irritates the skin   "

## 2024-05-03 NOTE — ED Notes (Signed)
 ED Provider at bedside.

## 2024-05-03 NOTE — Progress Notes (Signed)
 Plan of Care Note for accepted transfer   Patient: Judith Boone MRN: [id number]   DOA: 05/03/2024  Facility requesting transfer: [facility] Requesting Provider: [place], PA Reason for transfer: Acute hypoxic respite failure, COPD exacerbation Facility course:   Judith Boone is a-year-old female with past medical history significant for COPD on nocturnal O2 as needed, iron deficiency anemia, HTN, HLD, GERD who presented to [facility] with progressive shortness of breath.  Patient is afebrile without leukocytosis.  WBC count within normal limits.  Chest x-ray with small bilateral pleural effusions with no consolidation.  BNP elevated greater than 2000 but no signs of CHF/pulmonary vascular congestion per EDPA.  COVID/flu/RSV PCR negative.  Patient was given Solu-Medrol , DuoNebs.  attempted ambulation on room air with persistent hypoxia with SpO2 84% on room air.  Requesting transfer to [facility] for further evaluation and management of COPD exacerbation.   Plan of care: The patient is accepted for admission to Med-surg  unit, at [facility]..    Author: Camellia PARAS Regis Wiland, DO 05/03/2024  Check [email or web address] for on-call coverage.  Nursing staff, Please call [facility] Admits & Consults System-Wide number on Amion as soon as patient's arrival, so appropriate admitting provider can evaluate the pt.

## 2024-05-03 NOTE — ED Notes (Signed)
 RT attempted to ambulate patient. She remained on 2L throughout trial. Dropped to 84% when standing. Placed patient back in bed and PA aware.

## 2024-05-03 NOTE — ED Notes (Signed)
 Lab notified of hepatic function panel add-on.

## 2024-05-03 NOTE — ED Triage Notes (Signed)
 Pt c/o cough x 4 days.  Diarrhea Sunday.  Reports she wears O2 at night, PRN.    O2 sats noted to be 84% initially on entry to triage, after pt rested in chair, O2 increased to 88% on RA.   RT to triage to assess, 2 L O2 via [place] applied.

## 2024-05-03 NOTE — ED Notes (Signed)
 RT called to triage to place patient on oxygen . Patient wears 2L at bedtime at home, and is positive for productive cough. 89% on RA. Placed on 2L via O2 tank. SAT now 93%. Will monitor as needed

## 2024-05-03 NOTE — ED Provider Notes (Signed)
 " [facility] Provider Note   CSN: [id number] Arrival date & time: 05/03/24  1121     Patient presents with: Shortness of Breath   Judith Boone is a 89 y.o. female. With past medical history of HTN, HLD, CKD stage 3, Paroxysmal afib not anticoagulated due to anemia, COPD reporting to ER with complaint of SOB. Patient reports that she started having shortness of breath 3 days ago associated with productive cough.  She reports that she is coughing up tan sputum.  She reports she has had low-grade fever at home of 99.24F. Family reports when she is walking very short distances she begins gasping for air and needs to rest even with 2L [place] at home. She reports she has been requiring more oxygen  at home than normal.    She has not had any chest pain, abdominal pain nausea vomiting or loose stools with this.  Denies urinary symptoms.  Denies any swelling in her feet and ankles.    Shortness of Breath      Prior to Admission medications  Medication Sig Start Date End Date Taking? Authorizing Provider  amLODipine  (NORVASC ) 5 MG tablet Take 5 mg by mouth at bedtime.  04/13/15   [provider]  ANORO ELLIPTA  62.5-25 MCG/INH AEPB Inhale 1 puff into the lungs daily. 07/04/18   [provider]  Ascorbic Acid  (VITAMIN C  PO) Take 1 tablet by mouth daily.    [provider]  atorvastatin  (LIPITOR) 20 MG tablet Take 20 mg by mouth at bedtime.     [provider]  Calcium  Citrate-Vitamin D  (CALCIUM  + D PO) Take 1 tablet by mouth 2 (two) times daily.    [provider]  cetirizine (ZYRTEC) 10 MG tablet Take 10 mg by mouth daily as needed for allergies.    [provider]  Cholecalciferol 25 MCG (1000 UT) capsule Take 1,000 Units by mouth daily. 04/28/17   [provider]  ferrous sulfate  325 (65 FE) MG tablet Take 325 mg by mouth daily.    [provider]  hydrALAZINE  (APRESOLINE ) 25 MG tablet  Take 25 mg by mouth 2 (two) times daily.     [provider]  metoprolol  succinate (TOPROL -XL) 25 MG 24 hr tablet Take 1 tablet (25 mg total) by mouth daily. 07/23/18   Fenton, Clint R, PA  montelukast  (SINGULAIR ) 10 MG tablet Take 10 mg by mouth daily. 08/02/19   [provider]  Multiple Vitamin (MULTIVITAMIN) tablet Take 1 tablet by mouth daily.    [provider]  omeprazole  (PRILOSEC) 40 MG capsule Take 40 mg by mouth in the morning and at bedtime. 07/22/18   [provider]  OXYGEN  Inhale into the lungs. As needed    [provider]  pramipexole  (MIRAPEX ) 0.25 MG tablet Take 0.25 mg by mouth at bedtime. 02/04/21   [provider]  Spacer/Aero-Holding Chambers (AEROCHAMBER MV) inhaler Use as instructed with Albuterol  HFA 10/07/19   Parrett, Madelin GORMAN, NP  VENTOLIN  HFA 108 (90 BASE) MCG/ACT inhaler Inhale 1-2 puffs into the lungs every 6 (six) hours as needed for wheezing.  03/03/15   [provider]    Allergies: Bacitracin, Neosporin [neomycin-bacitracin zn-polymyx], and Polysporin [bacitracin-polymyxin b]    Review of Systems  Respiratory:  Positive for shortness of breath.     Updated Vital Signs BP (!) 146/65   Pulse 70   Temp (!) 97.5 F (36.4 C) (Oral)   Resp (!) 26  Ht 5' (1.524 m)   Wt 52.2 kg   SpO2 93%   BMI 22.46 kg/m   Physical Exam Vitals and nursing note reviewed.  Constitutional:      General: She is not in acute distress.    Appearance: She is not toxic-appearing.  HENT:     Head: Normocephalic and atraumatic.  Eyes:     General: No scleral icterus.    Conjunctiva/sclera: Conjunctivae normal.  Cardiovascular:     Rate and Rhythm: Normal rate and regular rhythm.     Pulses: Normal pulses.     Heart sounds: Normal heart sounds.  Pulmonary:     Effort: Pulmonary effort is normal. Tachypnea present. No respiratory distress.     Breath sounds: Wheezing present.     Comments: 2L [place] Abdominal:      General: Abdomen is flat. Bowel sounds are normal.     Palpations: Abdomen is soft.     Tenderness: There is no abdominal tenderness.  Musculoskeletal:     Right lower leg: No edema.     Left lower leg: No edema.  Skin:    General: Skin is warm and dry.     Findings: No lesion.  Neurological:     General: No focal deficit present.     Mental Status: She is alert and oriented to person, place, and time. Mental status is at baseline.     (all labs ordered are listed, but only abnormal results are displayed) Labs Reviewed  BASIC METABOLIC PANEL WITH GFR - Abnormal; Notable for the following components:      Result Value   CO2 21 (*)    BUN 26 (*)    Creatinine, Ser 1.38 (*)    Calcium  8.8 (*)    GFR, Estimated 36 (*)    All other components within normal limits  RESP PANEL BY RT-PCR (RSV, FLU A&B, COVID)  RVPGX2  CBC  PRO BRAIN NATRIURETIC PEPTIDE    EKG: EKG Interpretation Date/Time:  Tuesday May 03 2024 11:55:13 EST Ventricular Rate:  65 PR Interval:  54 QRS Duration:  92 QT Interval:  426 QTC Calculation: 443 R Axis:   -14  Text Interpretation: Sinus rhythm Short PR interval Left atrial enlargement Probable anteroseptal infarct, old Borderline ST depression, diffuse leads Minimal ST elevation, lateral leads Artifact in lead(s) I II III aVR aVL aVF V1 V2 V5 Confirmed by Jerrol Agent ([id number]) on 05/03/2024 12:17:55 PM  Radiology: DG Chest 2 View Result Date: 05/03/2024 CLINICAL DATA:  Cough and shortness of breath. EXAM: CHEST - 2 VIEW COMPARISON:  July 22, 2023 FINDINGS: The heart size and mediastinal contours are within normal limits. The lungs are hyperinflated. A stable subcentimeter calcified nodular opacity is seen overlying the lateral aspect of the mid to upper left lung with additional tiny, stable calcified nodular opacity is seen scattered throughout both lungs. Very small bilateral pleural effusions are seen. No focal consolidation or pneumothorax is identified.  A small, stable hiatal hernia is noted. Multilevel degenerative changes are present throughout the thoracic spine with mild to moderate severity levoscoliosis of the lower thoracic spine and upper lumbar spine. IMPRESSION: 1. Very small bilateral pleural effusions. 2. Evidence of prior granulomatous disease. 3. Small, stable hiatal hernia. Electronically Signed   By: Suzen Dials M.D.   On: 05/03/2024 12:40     .Critical Care  Performed by: Shermon Warren SAILOR, PA-C Authorized by: Shermon Warren SAILOR, PA-C   Critical care provider statement:    Critical care time (  minutes):  45   Critical care was necessary to treat or prevent imminent or life-threatening deterioration of the following conditions:  Respiratory failure   Critical care was time spent personally by me on the following activities:  Development of treatment plan with patient or surrogate, discussions with consultants, evaluation of patient's response to treatment, examination of patient, ordering and review of laboratory studies, ordering and review of radiographic studies, ordering and performing treatments and interventions, pulse oximetry, re-evaluation of patient's condition and review of old charts   Care discussed with: admitting provider      Medications Ordered in the ED  methylPREDNISolone  sodium succinate (SOLU-MEDROL ) 125 mg/2 mL injection 125 mg (has no administration in time range)  ipratropium-albuterol  (DUONEB) 0.5-2.5 (3) MG/3ML nebulizer solution 3 mL (has no administration in time range)                                    Medical Decision Making Amount and/or Complexity of Data Reviewed Labs: ordered. Radiology: ordered.  Risk OTC drugs. Prescription drug management. Decision regarding hospitalization.   This patient presents to the ED for concern of shortness of breath, this involves an extensive number of treatment options, and is a complaint that carries with it a high risk of complications and  morbidity.  The differential diagnosis includes CHF, PE, pneumonia, pneumothorax, pulmonary embolus, asthma, COPD   Co morbidities that complicate the patient evaluation  HTN, HLD, CKD stage 3, Paroxysmal afib not anticoagulated due to anemia, COPD    Additional history obtained:  Additional history obtained from patient required admission for COPD exacerbation 07/22/2023   Lab Tests:  I personally interpreted labs.  The pertinent results include:   CBC no leukocytosis, anemia stable at 9.7, plt 91 BMP with stable creatinine of 1.38 Resp panel negative  BNP elevated at 2,300 but no obvious congestion of x-ray or fluid overload on exam at this time.    Imaging Studies ordered:  I ordered imaging studies including chest x-ray I independently visualized and interpreted imaging which showed very small bilateral pleural effusions with no evidence of focal consolidation, infiltrate I agree with the radiologist interpretation   Cardiac Monitoring: / EKG:  The patient was maintained on a cardiac monitor.  I personally viewed and interpreted the cardiac monitored which showed an underlying rhythm of: Sinus with minimal ST changes   Consultations Obtained:  I requested consultation with the hospital team for admission,  and discussed lab and imaging findings as well as pertinent plan.   Problem List / ED Course / Critical interventions / Medication management  Patient is presenting with shortness of breath associated with intermittently productive cough for 4 days.  She reports that she gets very short of breath with any kind of exertion.  On arrival she was found to be hypoxic 88% on room air and mildly tachypneic.  She has no fever.  She is requiring continuous 2 L nasal cannula which is more than baseline, vitals have improved on 2 L nasal cannula.  She does have significant wheezing on exam but she is able to speak in full sentences.  Her lab work shows no leukocytosis and no  obvious pneumonia on her chest x-ray.  BNP is mildly elevated but no congestion on x-ray # fluid overload on exam.  Given wheezing suspect this is likely viral illness and COPD exacerbation.  Will give DuoNeb and steroids and reassess. Patient was reassessed  after DuoNeb treatment and Solu-Medrol .  She reports she is feeling a bit better.  When trying to ambulate in room she did drop to 84% on her 2 L nasal cannula which family at bedside reports is very abnormal for her.  Will give additional DuoNeb.  Given increasing oxygen  requirement and significant shortness of breath patient will require admission. I ordered medication including DuoNeb, Solu-Medrol  Reevaluation of the patient after these medicines showed that the patient improved I have reviewed the patients home medicines and have made adjustments as needed      Final diagnoses:  Acute hypoxic respiratory failure [facility])  COPD exacerbation [facility])    ED Discharge Orders     None          Shermon Warren SAILOR, PA-C 05/03/24 1526    Jerrol Agent, MD 05/03/24 1554  "

## 2024-05-04 ENCOUNTER — Observation Stay (HOSPITAL_COMMUNITY)

## 2024-05-04 ENCOUNTER — Encounter (HOSPITAL_COMMUNITY): Payer: Self-pay | Admitting: Internal Medicine

## 2024-05-04 ENCOUNTER — Other Ambulatory Visit (HOSPITAL_COMMUNITY): Payer: Self-pay

## 2024-05-04 DIAGNOSIS — R0602 Shortness of breath: Secondary | ICD-10-CM | POA: Diagnosis not present

## 2024-05-04 DIAGNOSIS — D696 Thrombocytopenia, unspecified: Secondary | ICD-10-CM | POA: Insufficient documentation

## 2024-05-04 DIAGNOSIS — G2581 Restless legs syndrome: Secondary | ICD-10-CM | POA: Insufficient documentation

## 2024-05-04 DIAGNOSIS — J441 Chronic obstructive pulmonary disease with (acute) exacerbation: Secondary | ICD-10-CM | POA: Diagnosis not present

## 2024-05-04 LAB — CBC
HCT: 26.6 % — ABNORMAL LOW (ref 36.0–46.0)
Hemoglobin: 8.9 g/dL — ABNORMAL LOW (ref 12.0–15.0)
MCH: 30 pg (ref 26.0–34.0)
MCHC: 33.5 g/dL (ref 30.0–36.0)
MCV: 89.6 fL (ref 80.0–100.0)
Platelets: 80 K/uL — ABNORMAL LOW (ref 150–400)
RBC: 2.97 MIL/uL — ABNORMAL LOW (ref 3.87–5.11)
RDW: 20.8 % — ABNORMAL HIGH (ref 11.5–15.5)
WBC: 2.8 K/uL — ABNORMAL LOW (ref 4.0–10.5)
nRBC: 5 % — ABNORMAL HIGH (ref 0.0–0.2)

## 2024-05-04 LAB — COMPREHENSIVE METABOLIC PANEL WITH GFR
ALT: 11 U/L (ref 0–44)
AST: 20 U/L (ref 15–41)
Albumin: 3.2 g/dL — ABNORMAL LOW (ref 3.5–5.0)
Alkaline Phosphatase: 116 U/L (ref 38–126)
Anion gap: 11 (ref 5–15)
BUN: 27 mg/dL — ABNORMAL HIGH (ref 8–23)
CO2: 21 mmol/L — ABNORMAL LOW (ref 22–32)
Calcium: 8.5 mg/dL — ABNORMAL LOW (ref 8.9–10.3)
Chloride: 106 mmol/L (ref 98–111)
Creatinine, Ser: 1.34 mg/dL — ABNORMAL HIGH (ref 0.44–1.00)
GFR, Estimated: 37 mL/min — ABNORMAL LOW
Glucose, Bld: 266 mg/dL — ABNORMAL HIGH (ref 70–99)
Potassium: 4.4 mmol/L (ref 3.5–5.1)
Sodium: 138 mmol/L (ref 135–145)
Total Bilirubin: 0.3 mg/dL (ref 0.0–1.2)
Total Protein: 6.1 g/dL — ABNORMAL LOW (ref 6.5–8.1)

## 2024-05-04 LAB — ECHOCARDIOGRAM COMPLETE
Area-P 1/2: 3.37 cm2
Calc EF: 58.1 %
Height: 60 in
MV VTI: 2.83 cm2
P 1/2 time: 504 ms
S' Lateral: 2.8 cm
Single Plane A2C EF: 59.7 %
Single Plane A4C EF: 56.2 %
Weight: 1840 [oz_av]

## 2024-05-04 MED ORDER — FERROUS SULFATE 325 (65 FE) MG PO TABS
325.0000 mg | ORAL_TABLET | Freq: Every day | ORAL | 0 refills | Status: AC
  Filled 2024-05-04: qty 30, 30d supply, fill #0

## 2024-05-04 MED ORDER — PREDNISONE 20 MG PO TABS
40.0000 mg | ORAL_TABLET | Freq: Every day | ORAL | 0 refills | Status: AC
  Filled 2024-05-04: qty 8, 4d supply, fill #0

## 2024-05-04 MED ORDER — DOXYCYCLINE HYCLATE 100 MG PO TABS
100.0000 mg | ORAL_TABLET | Freq: Two times a day (BID) | ORAL | 0 refills | Status: AC
  Filled 2024-05-04: qty 8, 4d supply, fill #0

## 2024-05-04 MED ORDER — FUROSEMIDE 10 MG/ML IJ SOLN
20.0000 mg | Freq: Once | INTRAMUSCULAR | Status: AC
  Administered 2024-05-04: 20 mg via INTRAVENOUS
  Filled 2024-05-04: qty 2

## 2024-05-04 NOTE — Plan of Care (Signed)

## 2024-05-04 NOTE — Progress Notes (Signed)
 Discharge medications delivered to patient at the bedside in a secure bag.

## 2024-05-04 NOTE — Evaluation (Signed)
 Physical Therapy Evaluation Patient Details Name: Judith Boone MRN: [id number] DOB: 1932-01-31 Today's Date: 05/04/2024  History of Present Illness  Patient is a 89 yo female presenting to the ED with cough x4 days and diarrhea on 05/03/24. Admitted with COPD exacerbation. PMH includes: COPD  iron deficiency anemia, HTN, HLD, GERD  Clinical Impression  Pt is a 89 y.o. female with above HPI resulting in the deficits listed below (see PT Problem List). Pt reports MOD I with mobility and ADLs at home and receives assist from son and neighbors as needed. Pt performed sit to stand transfers with MOD I. Pt ambulated total of ~57ft with CGA quickly progressing to close supervision. Pt with O2 desat to 78% with mobility, placed on 2L [place] and O2 sats up to 94% within ~1-68min. Educated on use of home O2 and monitoring of O2 sats upon d/c, pt verbalized understanding and reports has finger pulse ox at home. Pt will benefit from skilled PT to maximize functional mobility to increase independence. Recommend home with intermittent assist and HHPT.          Equipment Recommendations None recommended by PT (pt owns 4 wheeled walker)  Recommendations for Other Services       Functional Status Assessment Patient has had a recent decline in their functional status and demonstrates the ability to make significant improvements in function in a reasonable and predictable amount of time.     Precautions / Restrictions Precautions Precautions: Fall Restrictions Weight Bearing Restrictions Per Provider Order: No      Mobility  Bed Mobility               General bed mobility comments: pt in recliner pre/post session    Transfers Overall transfer level: Modified independent Equipment used: Rolling walker (2 wheels)                    Ambulation/Gait Ambulation/Gait assistance: Contact guard assist Gait Distance (Feet): 80 Feet Assistive device: Rolling walker (2 wheels) Gait  Pattern/deviations: Step-through pattern, Decreased stride length, Trunk flexed Gait velocity: decreased     General Gait Details: quick progression to close supervision once in hallway. Reports moving slower than normal gait speed. No reports of SOB.  Ambualted ~27ft wihtout use of AD and furniture reaching noted.  O2 low of 78% HR 105bpm with mobility when trialed on RA- RN notified. Recovery to 94% with 2L.  Stairs            Wheelchair Mobility     Tilt Bed    Modified Rankin (Stroke Patients Only)       Balance Overall balance assessment: Mild deficits observed, not formally tested                                           Pertinent Vitals/Pain Pain Assessment Pain Assessment: No/denies pain    Home Living Family/patient expects to be discharged to:: Private residence (indepdent living)                 Home Equipment: [employer] (2 wheels);Grab bars - tub/shower;Other (comment) (2L at night and as needed during day. 4 wheel RW) Additional Comments: has son and good neighbors, drives still but family and friends assist.    Prior Function               Mobility Comments: last fall month ago,  uses RW for communiuty distances. No AD around home (reports furniture reaching), but has been using more lately due to balance issues       Extremity/Trunk Assessment   Upper Extremity Assessment Upper Extremity Assessment: Overall WFL for tasks assessed    Lower Extremity Assessment Lower Extremity Assessment: Generalized weakness    Cervical / Trunk Assessment Cervical / Trunk Assessment: Kyphotic  Communication   Communication Communication: Impaired Factors Affecting Communication: Hearing impaired (has hearing aids in room)    Cognition Arousal: Alert Behavior During Therapy: WFL for tasks assessed/performed   PT - Cognitive impairments: No apparent impairments                         Following commands:  Intact       Cueing       General Comments      Exercises     Assessment/Plan    PT Assessment Patient needs continued PT services  PT Problem List Decreased strength;Decreased activity tolerance;Decreased balance;Decreased mobility;Cardiopulmonary status limiting activity       PT Treatment Interventions DME instruction;Gait training;Functional mobility training;Therapeutic activities;Therapeutic exercise;Balance training;Patient/family education    PT Goals (Current goals can be found in the Care Plan section)  Acute Rehab PT Goals Patient Stated Goal: go home today PT Goal Formulation: With patient Time For Goal Achievement: 05/18/24 Potential to Achieve Goals: Good    Frequency Min 3X/week     Co-evaluation               AM-PAC PT 6 Clicks Mobility  Outcome Measure Help needed turning from your back to your side while in a flat bed without using bedrails?: A Little Help needed moving from lying on your back to sitting on the side of a flat bed without using bedrails?: A Little Help needed moving to and from a bed to a chair (including a wheelchair)?: A Little Help needed standing up from a chair using your arms (e.g., wheelchair or bedside chair)?: A Little Help needed to walk in hospital room?: A Little Help needed climbing 3-5 steps with a railing? : A Lot 6 Click Score: 17    End of Session Equipment Utilized During Treatment: Gait belt;Oxygen  Activity Tolerance: Treatment limited secondary to medical complications (Comment) (O2 desat) Patient left: in chair;with call bell/phone within reach;with nursing/sitter in room Nurse Communication: Mobility status;Other (comment) (O2 sats with mobility) PT Visit Diagnosis: Unsteadiness on feet (R26.81);History of falling (Z91.81)    Time: 8866-8846 PT Time Calculation (min) (ACUTE ONLY): 20 min   Charges:   PT Evaluation $PT Eval Low Complexity: 1 Low PT Treatments $Therapeutic Activity: 8-22 mins PT  General Charges $$ ACUTE PT VISIT: 1 Visit        Tinnie BERRY PT, DPT  Acute Rehabilitation Services  Office [phone]  05/04/2024, 2:03 PM

## 2024-05-04 NOTE — Progress Notes (Signed)
 OT Screen Note  Patient Details Name: Judith Boone MRN: [id number] DOB: 1932/01/19   Cancelled Treatment:    Reason Eval/Treat Not Completed: OT screened, no needs identified, will sign off Patient working with PT, and stating she does not need she feels OT services. Has all DME for home and there are plans for HHPT at discharge. OT will sign off at this time, please re-consult if further acute needs arise.   Ronal Gift E. [place] Stell, OTR/L Acute Rehabilitation Services [phone]   Ronal Gift Salt 05/04/2024, 11:41 AM

## 2024-05-04 NOTE — Progress Notes (Signed)
 Discharge instructions given to patient questions asked and answered.

## 2024-05-04 NOTE — TOC Transition Note (Signed)
 Transition of Care [facility]) - Discharge Note   Patient Details  Name: Judith Boone MRN: [id number] Date of Birth: 1931-05-13  Transition of Care [facility]) CM/SW Contact:  Heather DELENA Saltness, LCSW Phone Number: 05/04/2024, 1:03 PM   Clinical Narrative:    Pt discharging home today. Pt resides at [facility] in an independent living apartment. Pt is on O2 at baseline. Pt reports she has travel tank for discharge. Pt recommended for [facility] PT services. Pt reports [place] has in-house PT services, which she will continue at home. Pt reports family will transport her home upon discharge. No further TOC needs at this time.   Final next level of care: Home/Self Care Barriers to Discharge: Barriers Resolved   Patient Goals and CMS Choice Patient states their goals for this hospitalization and ongoing recovery are:: To return to [facility] ILF   Choice offered to / list presented to : NA [place] ownership interest in [facility].provided to:: Parent NA    Discharge Placement  [facility] IL[facility]  Patient to be transferred to facility by: Daughter Name of family member notified: Patient Patient and family notified of of transfer: 05/04/24  Discharge Plan and Services Additional resources added to the After Visit Summary for  Follow Up                DME Arranged: N/A DME Agency: NA       HH Arranged: NA HH Agency: NA        Social Drivers of Health (SDOH) Interventions SDOH Screenings   Food Insecurity: No Food Insecurity (05/03/2024)  Housing: Low Risk (05/03/2024)  Transportation Needs: No Transportation Needs (05/03/2024)  Utilities: Not At Risk (05/03/2024)  Alcohol  Screen: Low Risk (10/18/2022)  Depression (PHQ2-9): Low Risk (10/18/2022)  Social Connections: Moderately Isolated (05/03/2024)  Tobacco Use: Medium Risk (05/04/2024)     Readmission Risk Interventions     No data to display           Signed: Heather Saltness, MSW, LCSW Clinical Social  Worker Inpatient Care Management 05/04/2024 1:06 PM

## 2024-05-04 NOTE — Progress Notes (Signed)
" °  Echocardiogram 2D Echocardiogram has been performed.  Judith Boone 05/04/2024, 9:26 AM "

## 2024-05-04 NOTE — Discharge Summary (Signed)
 Physician Discharge Summary  Judith Boone FMW:[id number] DOB: 1931/12/30 DOA: 05/03/2024  PCP: Larnell Hamilton, MD  Admit date: 05/03/2024 Discharge date: 05/04/2024    Admitted From: Assisted living facility Disposition: Assisted living facility  Recommendations for Outpatient Follow-up:  Follow up with PCP in 1-2 weeks Please obtain BMP/CBC in one week Please follow up with your PCP on the following pending results: Unresulted Labs (From admission, onward)    None         Home Health: None Equipment/Devices: None   Discharge Condition: Stable CODE STATUS: DNR Diet recommendation:  Diet Order             Diet regular Room service appropriate? Yes; Fluid consistency: Thin  Diet effective now                 Due to brief hospitalization, I have copied admitting hospitalist HPI and ED course.  HPI: Judith Boone is a 89 y.o. female with history of COPD on 2 L oxygen  at baseline, hypertension, chronic disease stage III, chronic anemia, GERD, hyperlipidemia, restless leg syndrome, prior history of paroxysmal atrial fibrillation not on anticoagulation secondary to history of anemia and no recurrent episodes brought to the ER because of shortness of breath.  Patient states over the last 3 to 4 days patient has been having wheezing shortness of breath on exertion and also has been having some productive cough with discolored sputum.  Denies any chest pain.  Shortness of breath is mostly on exertion.   ED Course: In the ER patient's chest x-ray shows mild pleural effusion bilaterally.  proBNP was 2300 EKG shows normal sinus rhythm hemoglobin 9.7 platelets 91.  Patient was placed on IV steroids nebulizer and admitted for COPD exacerbation.  At the time of my exam patient on 2 L oxygen  which is at baseline.  Flu and COVID test were negative.  Subjective: Patient seen and examined, she states that she feels much better and very close to her baseline.  She would like to go  home.  Brief/Interim Summary: Patient was admitted for acute COPD exacerbation, she was started on prednisone , doxycycline  and bronchodilators.  Patient has chronic hypoxic respiratory failure and she is 2 L of oxygen  dependent and she has been using 2 L.  On my examination, she has no wheezes and she is able to speak in full sentences and she feels like she is very close to her baseline and would like to go home which is very reasonable.  Discharging on 4 more days of prednisone  and doxycycline .  Has intermittent and chronic thrombocytopenia as well as leukopenia.  Recommend repeating CBC at PCPs office.  Has CKD stage IIIa, creatinine at baseline.  Resuming rest of the medications.  Discharge Diagnoses:  Principal Problem:   COPD exacerbation (HCC) Active Problems:   HTN (hypertension)   Hypercholesteremia   GERD (gastroesophageal reflux disease)   CKD (chronic kidney disease) stage 3, GFR 30-59 ml/min (HCC)   Anemia   Thrombocytopenia   Restless leg syndrome    Discharge Instructions   Allergies as of 05/04/2024       Reactions   Bacitracin    Makes wound worse    Neosporin [neomycin-bacitracin Zn-polymyx]    Makes wound worse    Polysporin [bacitracin-polymyxin B]    Makes wound worse    Tape Other (See Comments)   Irritates the skin        Medication List     TAKE these medications    pramipexole   0.25 MG tablet Commonly known as: MIRAPEX  Take 0.25 mg by mouth at bedtime. The timing of this medication is very important.   AeroChamber MV inhaler Use as instructed with Albuterol  HFA   amLODipine  5 MG tablet Commonly known as: NORVASC  Take 5 mg by mouth at bedtime.   atorvastatin  20 MG tablet Commonly known as: LIPITOR Take 20 mg by mouth at bedtime.   cetirizine 10 MG tablet Commonly known as: ZYRTEC Take 10 mg by mouth in the morning.   Citracal +D3 Tabs Take 1 tablet by mouth 2 (two) times daily with a meal.   doxycycline  100 MG tablet Commonly known  as: VIBRA -TABS Take 1 tablet (100 mg total) by mouth every 12 (twelve) hours for 4 days.   ferrous sulfate  325 (65 FE) MG tablet Take 1 tablet (325 mg total) by mouth daily.   fluticasone  50 MCG/ACT nasal spray Commonly known as: FLONASE  Place 1 spray into both nostrils 2 (two) times daily as needed for allergies or rhinitis.   hydrALAZINE  25 MG tablet Commonly known as: APRESOLINE  Take 25 mg by mouth 2 (two) times daily.   metoprolol  succinate 25 MG 24 hr tablet Commonly known as: TOPROL -XL Take 1 tablet (25 mg total) by mouth daily.   montelukast  10 MG tablet Commonly known as: SINGULAIR  Take 10 mg by mouth daily after supper.   multivitamin tablet Take 1 tablet by mouth daily with breakfast.   omeprazole  40 MG capsule Commonly known as: PRILOSEC Take 40 mg by mouth daily before breakfast.   OXYGEN  Inhale 2 L/min into the lungs as needed (for shortness of breath). As needed   predniSONE  20 MG tablet Commonly known as: DELTASONE  Take 2 tablets (40 mg total) by mouth daily with breakfast for 4 days.   Trelegy Ellipta 100-62.5-25 MCG/ACT Aepb Generic drug: Fluticasone -Umeclidin-Vilant Inhale 1 puff into the lungs in the morning.   Tylenol  325 MG tablet Generic drug: acetaminophen  Take 325-650 mg by mouth every 8 (eight) hours as needed for mild pain (pain score 1-3) (or headaches).   Ventolin  HFA 108 (90 Base) MCG/ACT inhaler Generic drug: albuterol  Inhale 1-2 puffs into the lungs every 8 (eight) hours as needed for wheezing or shortness of breath.        Follow-up Information     Larnell Hamilton, MD Follow up in 1 week(s).   Specialty: Internal Medicine Contact information: [address] [place] [address] [phone]                Allergies[1]  Consultations: None   Procedures/Studies: DG Chest 2 View Result Date: 05/03/2024 CLINICAL DATA:  Cough and shortness of breath. EXAM: CHEST - 2 VIEW COMPARISON:  July 22, 2023 FINDINGS: The  heart size and mediastinal contours are within normal limits. The lungs are hyperinflated. A stable subcentimeter calcified nodular opacity is seen overlying the lateral aspect of the mid to upper left lung with additional tiny, stable calcified nodular opacity is seen scattered throughout both lungs. Very small bilateral pleural effusions are seen. No focal consolidation or pneumothorax is identified. A small, stable hiatal hernia is noted. Multilevel degenerative changes are present throughout the thoracic spine with mild to moderate severity levoscoliosis of the lower thoracic spine and upper lumbar spine. IMPRESSION: 1. Very small bilateral pleural effusions. 2. Evidence of prior granulomatous disease. 3. Small, stable hiatal hernia. Electronically Signed   By: Suzen Dials M.D.   On: 05/03/2024 12:40     Discharge Exam: Vitals:   05/04/24 0503 05/04/24 9089  BP: 117/76   Pulse: 70   Resp: 18   Temp: 98 F (36.7 C)   SpO2: (!) 88% 95%   Vitals:   05/03/24 2242 05/03/24 2300 05/04/24 0503 05/04/24 0910  BP: (!) 140/63 (!) 145/72 117/76   Pulse:  85 70   Resp:  18 18   Temp:  (!) 97.4 F (36.3 C) 98 F (36.7 C)   TempSrc:   Oral   SpO2:  94% (!) 88% 95%  Weight:      Height:        General: Pt is alert, awake, not in acute distress Cardiovascular: RRR, S1/S2 +, no rubs, no gallops Respiratory: CTA bilaterally, no wheezing, no rhonchi Abdominal: Soft, NT, ND, bowel sounds + Extremities: no edema, no cyanosis    The results of significant diagnostics from this hospitalization (including imaging, microbiology, ancillary and laboratory) are listed below for reference.     Microbiology: Recent Results (from the past 240 hours)  Resp panel by RT-PCR (RSV, Flu A&B, Covid) Anterior Nasal Swab     Status: None   Collection Time: 05/03/24 12:40 PM   Specimen: Anterior Nasal Swab  Result Value Ref Range Status   SARS Coronavirus 2 by RT PCR NEGATIVE NEGATIVE Final     Comment: (NOTE) SARS-CoV-2 target nucleic acids are NOT DETECTED.  The SARS-CoV-2 RNA is generally detectable in upper respiratory specimens during the acute phase of infection. The lowest concentration of SARS-CoV-2 viral copies this assay can detect is 138 copies/mL. A negative result does not preclude SARS-Cov-2 infection and should not be used as the sole basis for treatment or other patient management decisions. A negative result may occur with  improper specimen collection/handling, submission of specimen other than nasopharyngeal swab, presence of viral mutation(s) within the areas targeted by this assay, and inadequate number of viral copies(<138 copies/mL). A negative result must be combined with clinical observations, patient history, and epidemiological information. The expected result is Negative.  Fact Sheet for Patients:  bloggercourse.com  Fact Sheet for Healthcare Providers:  seriousbroker.it  This test is no t yet approved or cleared by the [place]  FDA and  has been authorized for detection and/or diagnosis of SARS-CoV-2 by FDA under an Emergency Use Authorization (EUA). This EUA will remain  in effect (meaning this test can be used) for the duration of the COVID-19 declaration under Section 564(b)(1) of the Act, 21 U.S.C.section 360bbb-3(b)(1), unless the authorization is terminated  or revoked sooner.       Influenza A by PCR NEGATIVE NEGATIVE Final   Influenza B by PCR NEGATIVE NEGATIVE Final    Comment: (NOTE) The Xpert Xpress SARS-CoV-2/FLU/RSV plus assay is intended as an aid in the diagnosis of influenza from Nasopharyngeal swab specimens and should not be used as a sole basis for treatment. Nasal washings and aspirates are unacceptable for Xpert Xpress SARS-CoV-2/FLU/RSV testing.  Fact Sheet for Patients: bloggercourse.com  Fact Sheet for Healthcare  Providers: seriousbroker.it  This test is not yet approved or cleared by the [place]  FDA and has been authorized for detection and/or diagnosis of SARS-CoV-2 by FDA under an Emergency Use Authorization (EUA). This EUA will remain in effect (meaning this test can be used) for the duration of the COVID-19 declaration under Section 564(b)(1) of the Act, 21 U.S.C. section 360bbb-3(b)(1), unless the authorization is terminated or revoked.     Resp Syncytial Virus by PCR NEGATIVE NEGATIVE Final    Comment: (NOTE) Fact Sheet for Patients: bloggercourse.com  Fact Sheet  for Healthcare Providers: seriousbroker.it  This test is not yet approved or cleared by the [place]  FDA and has been authorized for detection and/or diagnosis of SARS-CoV-2 by FDA under an Emergency Use Authorization (EUA). This EUA will remain in effect (meaning this test can be used) for the duration of the COVID-19 declaration under Section 564(b)(1) of the Act, 21 U.S.C. section 360bbb-3(b)(1), unless the authorization is terminated or revoked.  Performed at [facility], [address] [place], [place] [address]      Labs: BNP (last 3 results) Recent Labs    07/04/23 1121 07/22/23 1926  BNP 240.9* 288.7*   Basic Metabolic Panel: Recent Labs  Lab 05/03/24 1145 05/04/24 0558  NA 142 138  K 4.1 4.4  CL 108 106  CO2 21* 21*  GLUCOSE 92 266*  BUN 26* 27*  CREATININE 1.38* 1.34*  CALCIUM  8.8* 8.5*   Liver Function Tests: Recent Labs  Lab 05/03/24 1301 05/04/24 0558  AST 23 20  ALT 16 11  ALKPHOS 119 116  BILITOT 0.4 0.3  PROT 6.8 6.1*  ALBUMIN 3.8 3.2*   No results for input(s): LIPASE, AMYLASE in the last 168 hours. No results for input(s): AMMONIA in the last 168 hours. CBC: Recent Labs  Lab 05/03/24 1145 05/04/24 0558  WBC 5.8 2.8*  HGB 9.7* 8.9*  HCT 29.3* 26.6*  MCV 89.9 89.6   PLT 91* 80*   Cardiac Enzymes: No results for input(s): CKTOTAL, CKMB, CKMBINDEX, TROPONINI in the last 168 hours. BNP: Invalid input(s): POCBNP CBG: No results for input(s): GLUCAP in the last 168 hours. D-Dimer No results for input(s): DDIMER in the last 72 hours. Hgb A1c No results for input(s): HGBA1C in the last 72 hours. Lipid Profile No results for input(s): CHOL, HDL, LDLCALC, TRIG, CHOLHDL, LDLDIRECT in the last 72 hours. Thyroid  function studies No results for input(s): TSH, T4TOTAL, T3FREE, THYROIDAB in the last 72 hours.  Invalid input(s): FREET3 Anemia work up No results for input(s): VITAMINB12, FOLATE, FERRITIN, TIBC, IRON, RETICCTPCT in the last 72 hours. Urinalysis    Component Value Date/Time   COLORURINE YELLOW 02/03/2019 1235   APPEARANCEUR CLEAR 02/03/2019 1235   LABSPEC 1.010 02/03/2019 1235   PHURINE 5.5 02/03/2019 1235   GLUCOSEU NEGATIVE 02/03/2019 1235   HGBUR NEGATIVE 02/03/2019 1235   BILIRUBINUR NEGATIVE 02/03/2019 1235   KETONESUR NEGATIVE 02/03/2019 1235   PROTEINUR NEGATIVE 02/03/2019 1235   UROBILINOGEN 0.2 08/17/2014 0000   NITRITE NEGATIVE 02/03/2019 1235   LEUKOCYTESUR NEGATIVE 02/03/2019 1235   Sepsis Labs Recent Labs  Lab 05/03/24 1145 05/04/24 0558  WBC 5.8 2.8*   Microbiology Recent Results (from the past 240 hours)  Resp panel by RT-PCR (RSV, Flu A&B, Covid) Anterior Nasal Swab     Status: None   Collection Time: 05/03/24 12:40 PM   Specimen: Anterior Nasal Swab  Result Value Ref Range Status   SARS Coronavirus 2 by RT PCR NEGATIVE NEGATIVE Final    Comment: (NOTE) SARS-CoV-2 target nucleic acids are NOT DETECTED.  The SARS-CoV-2 RNA is generally detectable in upper respiratory specimens during the acute phase of infection. The lowest concentration of SARS-CoV-2 viral copies this assay can detect is 138 copies/mL. A negative result does not preclude  SARS-Cov-2 infection and should not be used as the sole basis for treatment or other patient management decisions. A negative result may occur with  improper specimen collection/handling, submission of specimen other than nasopharyngeal swab, presence of viral mutation(s) within the areas  targeted by this assay, and inadequate number of viral copies(<138 copies/mL). A negative result must be combined with clinical observations, patient history, and epidemiological information. The expected result is Negative.  Fact Sheet for Patients:  bloggercourse.com  Fact Sheet for Healthcare Providers:  seriousbroker.it  This test is no t yet approved or cleared by the [place]  FDA and  has been authorized for detection and/or diagnosis of SARS-CoV-2 by FDA under an Emergency Use Authorization (EUA). This EUA will remain  in effect (meaning this test can be used) for the duration of the COVID-19 declaration under Section 564(b)(1) of the Act, 21 U.S.C.section 360bbb-3(b)(1), unless the authorization is terminated  or revoked sooner.       Influenza A by PCR NEGATIVE NEGATIVE Final   Influenza B by PCR NEGATIVE NEGATIVE Final    Comment: (NOTE) The Xpert Xpress SARS-CoV-2/FLU/RSV plus assay is intended as an aid in the diagnosis of influenza from Nasopharyngeal swab specimens and should not be used as a sole basis for treatment. Nasal washings and aspirates are unacceptable for Xpert Xpress SARS-CoV-2/FLU/RSV testing.  Fact Sheet for Patients: bloggercourse.com  Fact Sheet for Healthcare Providers: seriousbroker.it  This test is not yet approved or cleared by the [place]  FDA and has been authorized for detection and/or diagnosis of SARS-CoV-2 by FDA under an Emergency Use Authorization (EUA). This EUA will remain in effect (meaning this test can be used) for the duration of  the COVID-19 declaration under Section 564(b)(1) of the Act, 21 U.S.C. section 360bbb-3(b)(1), unless the authorization is terminated or revoked.     Resp Syncytial Virus by PCR NEGATIVE NEGATIVE Final    Comment: (NOTE) Fact Sheet for Patients: bloggercourse.com  Fact Sheet for Healthcare Providers: seriousbroker.it  This test is not yet approved or cleared by the [place]  FDA and has been authorized for detection and/or diagnosis of SARS-CoV-2 by FDA under an Emergency Use Authorization (EUA). This EUA will remain in effect (meaning this test can be used) for the duration of the COVID-19 declaration under Section 564(b)(1) of the Act, 21 U.S.C. section 360bbb-3(b)(1), unless the authorization is terminated or revoked.  Performed at [facility], [address] [place], [place] [address]     FURTHER DISCHARGE INSTRUCTIONS:   Get Medicines reviewed and adjusted: Please take all your medications with you for your next visit with your Primary MD   Laboratory/radiological data: Please request your Primary MD to go over all hospital tests and procedure/radiological results at the follow up, please ask your Primary MD to get all Hospital records sent to his/her office.   In some cases, they will be blood work, cultures and biopsy results pending at the time of your discharge. Please request that your primary care M.D. goes through all the records of your hospital data and follows up on these results.   Also Note the following: If you experience worsening of your admission symptoms, develop shortness of breath, life threatening emergency, suicidal or homicidal thoughts you must seek medical attention immediately by calling 911 or calling your MD immediately  if symptoms less severe.   You must read complete instructions/literature along with all the possible adverse reactions/side effects for all the Medicines you  take and that have been prescribed to you. Take any new Medicines after you have completely understood and accpet all the possible adverse reactions/side effects.    patient was instructed, not to drive, operate heavy machinery, perform activities at heights, swimming or participation in water activities  or provide baby-sitting services while on Pain, Sleep and Anxiety Medications; until their outpatient Physician has advised to do so again. Also recommended to not to take more than prescribed Pain, Sleep and Anxiety Medications.  It is not advisable to combine anxiety, sleep and pain medications without talking with your primary care provider.     Wear Seat belts while driving.   Please note: You were cared for by a hospitalist during your hospital stay. Once you are discharged, your primary care physician will handle any further medical issues. Please note that NO REFILLS for any discharge medications will be authorized once you are discharged, as it is imperative that you return to your primary care physician (or establish a relationship with a primary care physician if you do not have one) for your post hospital discharge needs so that they can reassess your need for medications and monitor your lab values  Time coordinating discharge: Over 30 minutes  SIGNED:   Fredia Skeeter, MD  [facility] 05/04/2024, 10:01 AM *Please note that this is a verbal dictation therefore any spelling or grammatical errors are due to the Dragon Medical One system interpretation. If 7PM-7AM, please contact night-coverage [email or web address]     [1]  Allergies Allergen Reactions   Bacitracin     Makes wound worse    Neosporin [Neomycin-Bacitracin Zn-Polymyx]     Makes wound worse    Polysporin [Bacitracin-Polymyxin B]     Makes wound worse    Tape Other (See Comments)    Irritates the skin

## 2024-06-07 ENCOUNTER — Ambulatory Visit: Admitting: Podiatry

## 2024-07-11 ENCOUNTER — Ambulatory Visit: Admitting: Internal Medicine

## 2024-07-11 ENCOUNTER — Other Ambulatory Visit
